# Patient Record
Sex: Male | Born: 1970
Health system: Southern US, Community
[De-identification: ages and names within clinical notes are randomized; demographics above are authoritative.]

## PROBLEM LIST (undated history)

## (undated) DIAGNOSIS — E785 Hyperlipidemia, unspecified: Secondary | ICD-10-CM

## (undated) DIAGNOSIS — F4310 Post-traumatic stress disorder, unspecified: Secondary | ICD-10-CM

## (undated) DIAGNOSIS — G4733 Obstructive sleep apnea (adult) (pediatric): Secondary | ICD-10-CM

## (undated) DIAGNOSIS — T7840XA Allergy, unspecified, initial encounter: Secondary | ICD-10-CM

## (undated) DIAGNOSIS — N400 Enlarged prostate without lower urinary tract symptoms: Secondary | ICD-10-CM

## (undated) DIAGNOSIS — G4730 Sleep apnea, unspecified: Secondary | ICD-10-CM

## (undated) DIAGNOSIS — M25561 Pain in right knee: Secondary | ICD-10-CM

## (undated) DIAGNOSIS — K219 Gastro-esophageal reflux disease without esophagitis: Secondary | ICD-10-CM

## (undated) DIAGNOSIS — M545 Low back pain, unspecified: Secondary | ICD-10-CM

## (undated) DIAGNOSIS — F419 Anxiety disorder, unspecified: Secondary | ICD-10-CM

## (undated) DIAGNOSIS — Z8673 Personal history of transient ischemic attack (TIA), and cerebral infarction without residual deficits: Secondary | ICD-10-CM

## (undated) DIAGNOSIS — M25562 Pain in left knee: Secondary | ICD-10-CM

## (undated) DIAGNOSIS — G709 Myoneural disorder, unspecified: Secondary | ICD-10-CM

## (undated) DIAGNOSIS — E279 Disorder of adrenal gland, unspecified: Secondary | ICD-10-CM

## (undated) DIAGNOSIS — M722 Plantar fascial fibromatosis: Secondary | ICD-10-CM

## (undated) DIAGNOSIS — F32A Depression, unspecified: Secondary | ICD-10-CM

## (undated) DIAGNOSIS — T8859XA Other complications of anesthesia, initial encounter: Secondary | ICD-10-CM

## (undated) DIAGNOSIS — G40909 Epilepsy, unspecified, not intractable, without status epilepticus: Secondary | ICD-10-CM

## (undated) DIAGNOSIS — R0789 Other chest pain: Secondary | ICD-10-CM

## (undated) DIAGNOSIS — M1990 Unspecified osteoarthritis, unspecified site: Secondary | ICD-10-CM

## (undated) DIAGNOSIS — E119 Type 2 diabetes mellitus without complications: Secondary | ICD-10-CM

## (undated) HISTORY — DX: Pain in right knee: M25.562

## (undated) HISTORY — PX: ESOPHAGOGASTRODUODENOSCOPY: SHX1529

## (undated) HISTORY — DX: Hyperlipidemia, unspecified: E78.5

## (undated) HISTORY — DX: Epilepsy, unspecified, not intractable, without status epilepticus: G40.909

## (undated) HISTORY — DX: Low back pain, unspecified: M54.50

## (undated) HISTORY — DX: Type 2 diabetes mellitus without complications: E11.9

## (undated) HISTORY — DX: Gastro-esophageal reflux disease without esophagitis: K21.9

## (undated) HISTORY — DX: Myoneural disorder, unspecified: G70.9

## (undated) HISTORY — DX: Allergy, unspecified, initial encounter: T78.40XA

## (undated) HISTORY — DX: Plantar fascial fibromatosis: M72.2

## (undated) HISTORY — DX: Other chest pain: R07.89

## (undated) HISTORY — DX: Pain in right knee: M25.561

## (undated) HISTORY — DX: Disorder of adrenal gland, unspecified: E27.9

## (undated) HISTORY — PX: COLONOSCOPY: SHX174

## (undated) HISTORY — DX: Benign prostatic hyperplasia without lower urinary tract symptoms: N40.0

## (undated) HISTORY — PX: NECK SURGERY: SHX720

## (undated) HISTORY — DX: Low back pain: M54.5

## (undated) HISTORY — PX: ENDOSCOPIC PLANTAR FASCIOTOMY: SUR443

## (undated) HISTORY — DX: Sleep apnea, unspecified: G47.30

## (undated) HISTORY — DX: Personal history of transient ischemic attack (TIA), and cerebral infarction without residual deficits: Z86.73

## (undated) HISTORY — PX: SHOULDER SURGERY: SHX246

## (undated) HISTORY — DX: Obstructive sleep apnea (adult) (pediatric): G47.33

## (undated) HISTORY — PX: HEEL SPUR SURGERY: SHX665

## (undated) HISTORY — PX: CARPAL TUNNEL RELEASE: SHX101

## (undated) HISTORY — PX: UPPER GASTROINTESTINAL ENDOSCOPY: SHX188

---

## 1999-02-01 DIAGNOSIS — I639 Cerebral infarction, unspecified: Secondary | ICD-10-CM

## 1999-02-01 HISTORY — DX: Cerebral infarction, unspecified: I63.9

## 2002-08-31 ENCOUNTER — Encounter: Payer: Self-pay | Admitting: Family Medicine

## 2002-08-31 ENCOUNTER — Encounter: Admission: RE | Admit: 2002-08-31 | Discharge: 2002-08-31 | Payer: Self-pay | Admitting: Family Medicine

## 2004-08-03 ENCOUNTER — Emergency Department (HOSPITAL_COMMUNITY): Admission: EM | Admit: 2004-08-03 | Discharge: 2004-08-03 | Payer: Self-pay | Admitting: Emergency Medicine

## 2004-10-25 ENCOUNTER — Ambulatory Visit: Payer: Self-pay | Admitting: Internal Medicine

## 2004-11-02 ENCOUNTER — Ambulatory Visit: Payer: Self-pay | Admitting: Pulmonary Disease

## 2004-11-04 ENCOUNTER — Ambulatory Visit (HOSPITAL_BASED_OUTPATIENT_CLINIC_OR_DEPARTMENT_OTHER): Admission: RE | Admit: 2004-11-04 | Discharge: 2004-11-04 | Payer: Self-pay | Admitting: Pulmonary Disease

## 2004-11-09 ENCOUNTER — Ambulatory Visit: Payer: Self-pay | Admitting: Internal Medicine

## 2004-11-10 ENCOUNTER — Ambulatory Visit: Payer: Self-pay | Admitting: Pulmonary Disease

## 2004-11-22 ENCOUNTER — Ambulatory Visit: Payer: Self-pay | Admitting: Pulmonary Disease

## 2004-12-21 ENCOUNTER — Ambulatory Visit: Payer: Self-pay | Admitting: Pulmonary Disease

## 2005-01-04 ENCOUNTER — Ambulatory Visit: Payer: Self-pay | Admitting: Internal Medicine

## 2005-01-18 ENCOUNTER — Ambulatory Visit: Payer: Self-pay | Admitting: Internal Medicine

## 2005-03-24 ENCOUNTER — Ambulatory Visit: Payer: Self-pay | Admitting: Internal Medicine

## 2005-07-05 ENCOUNTER — Ambulatory Visit: Payer: Self-pay | Admitting: Internal Medicine

## 2005-07-11 ENCOUNTER — Ambulatory Visit: Payer: Self-pay | Admitting: Internal Medicine

## 2005-09-29 ENCOUNTER — Ambulatory Visit: Payer: Self-pay | Admitting: Internal Medicine

## 2005-10-07 ENCOUNTER — Ambulatory Visit: Payer: Self-pay | Admitting: Internal Medicine

## 2005-10-07 ENCOUNTER — Ambulatory Visit (HOSPITAL_COMMUNITY): Admission: RE | Admit: 2005-10-07 | Discharge: 2005-10-07 | Payer: Self-pay | Admitting: Internal Medicine

## 2005-10-20 ENCOUNTER — Ambulatory Visit: Payer: Self-pay | Admitting: Internal Medicine

## 2005-10-31 ENCOUNTER — Ambulatory Visit: Payer: Self-pay | Admitting: Internal Medicine

## 2006-03-02 ENCOUNTER — Ambulatory Visit: Payer: Self-pay | Admitting: Internal Medicine

## 2006-05-02 ENCOUNTER — Ambulatory Visit: Payer: Self-pay | Admitting: Internal Medicine

## 2006-05-11 ENCOUNTER — Encounter: Admission: RE | Admit: 2006-05-11 | Discharge: 2006-07-06 | Payer: Self-pay | Admitting: Internal Medicine

## 2006-08-23 DIAGNOSIS — M545 Low back pain: Secondary | ICD-10-CM

## 2006-08-23 DIAGNOSIS — E785 Hyperlipidemia, unspecified: Secondary | ICD-10-CM

## 2006-08-23 DIAGNOSIS — G40909 Epilepsy, unspecified, not intractable, without status epilepticus: Secondary | ICD-10-CM | POA: Insufficient documentation

## 2006-08-23 DIAGNOSIS — E1169 Type 2 diabetes mellitus with other specified complication: Secondary | ICD-10-CM | POA: Insufficient documentation

## 2006-08-23 DIAGNOSIS — G4733 Obstructive sleep apnea (adult) (pediatric): Secondary | ICD-10-CM | POA: Insufficient documentation

## 2006-08-23 DIAGNOSIS — M722 Plantar fascial fibromatosis: Secondary | ICD-10-CM | POA: Insufficient documentation

## 2006-08-23 DIAGNOSIS — M2550 Pain in unspecified joint: Secondary | ICD-10-CM | POA: Insufficient documentation

## 2006-08-23 DIAGNOSIS — G8929 Other chronic pain: Secondary | ICD-10-CM | POA: Insufficient documentation

## 2006-08-23 DIAGNOSIS — Z8673 Personal history of transient ischemic attack (TIA), and cerebral infarction without residual deficits: Secondary | ICD-10-CM | POA: Insufficient documentation

## 2006-08-23 DIAGNOSIS — J309 Allergic rhinitis, unspecified: Secondary | ICD-10-CM | POA: Insufficient documentation

## 2006-08-23 DIAGNOSIS — M109 Gout, unspecified: Secondary | ICD-10-CM | POA: Insufficient documentation

## 2006-09-04 ENCOUNTER — Encounter: Payer: Self-pay | Admitting: Internal Medicine

## 2007-02-08 ENCOUNTER — Ambulatory Visit: Payer: Self-pay | Admitting: Internal Medicine

## 2007-02-08 LAB — CONVERTED CEMR LAB
ALT: 11 units/L (ref 0–53)
AST: 16 units/L (ref 0–37)
BUN: 8 mg/dL (ref 6–23)
Bilirubin, Direct: 0.1 mg/dL (ref 0.0–0.3)
Cholesterol: 269 mg/dL (ref 0–200)
Eosinophils Absolute: 0.1 10*3/uL (ref 0.0–0.6)
Eosinophils Relative: 1.7 % (ref 0.0–5.0)
GFR calc Af Amer: 97 mL/min
Ketones, ur: NEGATIVE mg/dL
Leukocytes, UA: NEGATIVE
Lymphocytes Relative: 37.8 % (ref 12.0–46.0)
MCHC: 33.8 g/dL (ref 30.0–36.0)
Monocytes Relative: 7.1 % (ref 3.0–11.0)
Neutro Abs: 3.4 10*3/uL (ref 1.4–7.7)
Neutrophils Relative %: 52.9 % (ref 43.0–77.0)
Nitrite: NEGATIVE
Potassium: 3.8 meq/L (ref 3.5–5.1)
Sodium: 135 meq/L (ref 135–145)
Total Bilirubin: 0.7 mg/dL (ref 0.3–1.2)
Total CHOL/HDL Ratio: 6.8
Triglycerides: 94 mg/dL (ref 0–149)
VLDL: 19 mg/dL (ref 0–40)
WBC: 6.5 10*3/uL (ref 4.5–10.5)
pH: 6 (ref 5.0–8.0)

## 2007-02-15 ENCOUNTER — Ambulatory Visit: Payer: Self-pay | Admitting: Internal Medicine

## 2007-02-15 DIAGNOSIS — M25519 Pain in unspecified shoulder: Secondary | ICD-10-CM | POA: Insufficient documentation

## 2007-02-22 ENCOUNTER — Ambulatory Visit (HOSPITAL_BASED_OUTPATIENT_CLINIC_OR_DEPARTMENT_OTHER): Admission: RE | Admit: 2007-02-22 | Discharge: 2007-02-22 | Payer: Self-pay | Admitting: Orthopaedic Surgery

## 2007-03-13 ENCOUNTER — Encounter: Admission: RE | Admit: 2007-03-13 | Discharge: 2007-05-03 | Payer: Self-pay | Admitting: Orthopaedic Surgery

## 2007-04-06 ENCOUNTER — Ambulatory Visit: Payer: Self-pay | Admitting: Internal Medicine

## 2007-04-06 LAB — CONVERTED CEMR LAB
CRP, High Sensitivity: 2 (ref 0.00–5.00)
Cholesterol: 156 mg/dL (ref 0–200)
HDL: 49.9 mg/dL (ref >39.0)
Triglycerides: 56 mg/dL (ref 0–149)
Uric Acid, Serum: 6.7 mg/dL (ref 2.4–7.0)

## 2007-04-12 ENCOUNTER — Ambulatory Visit: Payer: Self-pay | Admitting: Internal Medicine

## 2007-05-18 ENCOUNTER — Ambulatory Visit (HOSPITAL_BASED_OUTPATIENT_CLINIC_OR_DEPARTMENT_OTHER): Admission: RE | Admit: 2007-05-18 | Discharge: 2007-05-18 | Payer: Self-pay | Admitting: Orthopaedic Surgery

## 2007-06-06 ENCOUNTER — Ambulatory Visit: Payer: Self-pay | Admitting: Internal Medicine

## 2007-06-06 LAB — CONVERTED CEMR LAB: Uric Acid, Serum: 7 mg/dL (ref 4.0–7.8)

## 2007-06-12 ENCOUNTER — Ambulatory Visit: Payer: Self-pay | Admitting: Internal Medicine

## 2007-06-12 DIAGNOSIS — M79609 Pain in unspecified limb: Secondary | ICD-10-CM | POA: Insufficient documentation

## 2007-06-12 LAB — CONVERTED CEMR LAB
Anti Nuclear Antibody(ANA): NEGATIVE
Rhuematoid fact SerPl-aCnc: <20 intl units/mL — ABNORMAL LOW (ref 0.0–20.0)
Sed Rate: 13 mm/hr (ref 0–16)

## 2007-06-13 ENCOUNTER — Telehealth (INDEPENDENT_AMBULATORY_CARE_PROVIDER_SITE_OTHER): Payer: Self-pay | Admitting: *Deleted

## 2007-06-18 ENCOUNTER — Encounter: Payer: Self-pay | Admitting: Internal Medicine

## 2007-07-04 ENCOUNTER — Encounter: Payer: Self-pay | Admitting: Internal Medicine

## 2007-09-17 ENCOUNTER — Ambulatory Visit: Payer: Self-pay | Admitting: Internal Medicine

## 2008-08-28 ENCOUNTER — Ambulatory Visit: Payer: Self-pay | Admitting: Internal Medicine

## 2008-08-28 LAB — CONVERTED CEMR LAB
ALT: 11 units/L (ref 0–53)
AST: 18 units/L (ref 0–37)
Albumin: 4.3 g/dL (ref 3.5–5.2)
Alkaline Phosphatase: 63 units/L (ref 39–117)
Basophils Absolute: 0 10*3/uL (ref 0.0–0.1)
Basophils Relative: 0 % (ref 0–1)
Bilirubin Urine: NEGATIVE
CO2: 21 meq/L (ref 19–32)
Calcium: 9.4 mg/dL (ref 8.4–10.5)
Cholesterol: 245 mg/dL — ABNORMAL HIGH (ref 0–200)
Creatinine, Ser: 1.05 mg/dL (ref 0.40–1.50)
Eosinophils Absolute: 0.1 10*3/uL (ref 0.0–0.7)
HDL: 41 mg/dL (ref >39)
Ketones, ur: NEGATIVE mg/dL
MCHC: 32.6 g/dL (ref 30.0–36.0)
MCV: 78.8 fL (ref 78.0–100.0)
Monocytes Relative: 9 % (ref 3–12)
Neutro Abs: 2.7 10*3/uL (ref 1.7–7.7)
Neutrophils Relative %: 48 % (ref 43–77)
Platelets: 255 10*3/uL (ref 150–400)
RDW: 15.3 % (ref 11.5–15.5)
Specific Gravity, Urine: 1.022 (ref 1.005–1.030)
Total Bilirubin: 0.3 mg/dL (ref 0.3–1.2)
Urobilinogen, UA: 1 (ref 0.0–1.0)
pH: 6 (ref 5.0–8.0)

## 2008-09-16 ENCOUNTER — Ambulatory Visit: Payer: Self-pay | Admitting: Internal Medicine

## 2008-09-23 ENCOUNTER — Telehealth: Payer: Self-pay | Admitting: Internal Medicine

## 2008-12-08 ENCOUNTER — Ambulatory Visit: Payer: Self-pay | Admitting: Diagnostic Radiology

## 2008-12-08 ENCOUNTER — Ambulatory Visit: Payer: Self-pay | Admitting: Internal Medicine

## 2008-12-08 ENCOUNTER — Ambulatory Visit (HOSPITAL_BASED_OUTPATIENT_CLINIC_OR_DEPARTMENT_OTHER): Admission: RE | Admit: 2008-12-08 | Discharge: 2008-12-08 | Payer: Self-pay | Admitting: Internal Medicine

## 2008-12-08 DIAGNOSIS — M25561 Pain in right knee: Secondary | ICD-10-CM

## 2008-12-08 DIAGNOSIS — G8929 Other chronic pain: Secondary | ICD-10-CM | POA: Insufficient documentation

## 2008-12-08 DIAGNOSIS — M25562 Pain in left knee: Secondary | ICD-10-CM

## 2008-12-08 LAB — CONVERTED CEMR LAB
Anti Nuclear Antibody(ANA): NEGATIVE
Sed Rate: 6 mm/hr (ref 0–16)

## 2008-12-14 ENCOUNTER — Telehealth: Payer: Self-pay | Admitting: Internal Medicine

## 2009-01-07 ENCOUNTER — Ambulatory Visit: Payer: Self-pay | Admitting: Internal Medicine

## 2009-01-13 ENCOUNTER — Ambulatory Visit: Payer: Self-pay | Admitting: Internal Medicine

## 2009-02-11 ENCOUNTER — Encounter: Payer: Self-pay | Admitting: Internal Medicine

## 2009-03-16 ENCOUNTER — Ambulatory Visit: Payer: Self-pay | Admitting: Internal Medicine

## 2009-03-16 DIAGNOSIS — E669 Obesity, unspecified: Secondary | ICD-10-CM | POA: Insufficient documentation

## 2009-04-14 ENCOUNTER — Encounter: Admission: RE | Admit: 2009-04-14 | Discharge: 2009-06-16 | Payer: Self-pay | Admitting: Internal Medicine

## 2009-04-16 ENCOUNTER — Encounter: Payer: Self-pay | Admitting: Internal Medicine

## 2009-05-06 ENCOUNTER — Telehealth: Payer: Self-pay | Admitting: Internal Medicine

## 2009-06-04 ENCOUNTER — Encounter: Payer: Self-pay | Admitting: Internal Medicine

## 2009-06-11 ENCOUNTER — Telehealth: Payer: Self-pay | Admitting: Internal Medicine

## 2009-06-12 ENCOUNTER — Encounter: Payer: Self-pay | Admitting: Internal Medicine

## 2009-06-12 LAB — CONVERTED CEMR LAB
ALT: 11 units/L (ref 0–53)
AST: 14 units/L (ref 0–37)
HDL: 43 mg/dL (ref >39)
LDL Cholesterol: 200 mg/dL — ABNORMAL HIGH (ref 0–99)
Total CHOL/HDL Ratio: 6.2
VLDL: 24 mg/dL (ref 0–40)

## 2009-06-15 ENCOUNTER — Ambulatory Visit: Payer: Self-pay | Admitting: Internal Medicine

## 2009-06-16 ENCOUNTER — Encounter (INDEPENDENT_AMBULATORY_CARE_PROVIDER_SITE_OTHER): Payer: Self-pay | Admitting: *Deleted

## 2009-06-16 ENCOUNTER — Encounter: Payer: Self-pay | Admitting: Internal Medicine

## 2009-09-18 ENCOUNTER — Encounter: Payer: Self-pay | Admitting: Internal Medicine

## 2009-09-21 ENCOUNTER — Ambulatory Visit: Payer: Self-pay | Admitting: Internal Medicine

## 2010-01-14 ENCOUNTER — Telehealth (INDEPENDENT_AMBULATORY_CARE_PROVIDER_SITE_OTHER): Payer: Self-pay | Admitting: *Deleted

## 2010-03-04 NOTE — Miscellaneous (Signed)
Summary: PT Progress Note/[facility]  PT Progress Note/[facility]   Imported By: Lanelle Bal 06/10/2009 09:41:54  _____________________________________________________________________  External Attachment:    Type:   Image     Comment:   External Document

## 2010-03-04 NOTE — Letter (Signed)
Summary: CMN for CPAP/[facility]  CMN for CPAP/[facility]   Imported By: Lanelle Bal 09/25/2009 11:02:59  _____________________________________________________________________  External Attachment:    Type:   Image     Comment:   External Document

## 2010-03-04 NOTE — Consult Note (Signed)
Summary: [facility] Rheumatology & Clinical Immunology  [facility] Rheumatology & Clinical Immunology   Imported By: Lanelle Bal 03/05/2009 11:21:17  _____________________________________________________________________  External Attachment:    Type:   Image     Comment:   External Document

## 2010-03-04 NOTE — Progress Notes (Signed)
Summary: Flexiril Refill  Phone Note Call from Patient Call back at Home Phone [phone]   Caller: Patient Reason for Call: Refill Medication Summary of Call: pt is requesting refill for muscle relaxer for his back pain  Diane Tomerlin  May 06, 2009 1:40 PM  Follow-up for Phone Call        ok to refill flexeril x 1 Follow-up by: D. Thomos Lemons DO,  May 06, 2009 10:08 PM  Additional Follow-up for Phone Call Additional follow up Details #1::        [facility] [address]  Attempted to contact pt. and let him know refill was sent in but home # is unable to accept calls.   Left message on voicemail at work number to return call.  Mervin Kung CMA  May 07, 2009 9:39 AM     Additional Follow-up for Phone Call Additional follow up Details #2::    attempted to contact patient at  [phone], no answer, voice message left informing patient rx sent to pharmacy Follow-up by: Glendell Docker CMA,  May 07, 2009 3:31 PM  Prescriptions: CYCLOBENZAPRINE HCL 5 MG TABS (CYCLOBENZAPRINE HCL) one by mouth at bedtime prn  #30 x 0   Entered by:   Mervin Kung CMA   Authorized by:   D. Thomos Lemons DO   Signed by:   Mervin Kung CMA on 05/07/2009   Method used:   [facility]  [address] [id number]       [address]       [place], [place]  [address]       Ph: [phone]       Fax: [phone]   RxID:   [id number]

## 2010-03-04 NOTE — Progress Notes (Signed)
  Phone Note Other Incoming   Request: Send information Summary of Call: Request for records received from [facility] office. Request forwarded to [employer].

## 2010-03-04 NOTE — Progress Notes (Signed)
Summary: Pt question about 5.16.11 appt  Phone Note Call from Patient Call back at [phone]   Caller: Patient Reason for Call: Talk to Nurse Summary of Call: Pt has an appt on Mon 06/15/09, he wants to know if he needs to have any labs before the appt. Pls call pt to advise Initial call taken by: Lannette Donath,  Jun 11, 2009 9:26 AM  Follow-up for Phone Call        patient has been advised of blood work needed. He states he will have the labs drawn on  Friday 06/12/2009 Follow-up by: Glendell Docker CMA,  Jun 11, 2009 5:29 PM

## 2010-03-04 NOTE — Assessment & Plan Note (Signed)
Summary: 2 MONTH FOLLOW UP/[facility]   Vital Signs:  Patient profile:   40 year old Hodge Height:      70 inches Weight:      269.50 pounds BMI:     38.81 O2 Sat:      97 % on Room air Temp:     98.6 degrees F oral Pulse rate:   84 / minute Pulse rhythm:   regular Resp:     20 per minute BP sitting:   116 / 80  (left arm) Cuff size:   large  Vitals Entered By: Glendell Docker CMA (Jun 15, 2009 10:41 AM)  O2 Flow:  Room air CC: Rm 2- 2 Month Follow up disease management Pain Assessment Patient in pain? yes     Location: back & Knee Type: aching & throbbing   Primary Care Provider:  [name]  CC:  Rm 2- 2 Month Follow up disease management.  History of Present Illness: [name] for f/u  still  /o bilateral pain in knee- worse when going from a sitting toh standing, swelling in legs with wearing of socks and on a daily basis from knees down muscle relaxers and sulindac helping   obesity breastfast - slim fast daily,  honey nut cheerios lunch - light meal dinner - baked or grilled chicken, no dairy products, macronic and cheese  Allergies: 1)  ! Lipitor  Past History:  Past Medical History: Allergic rhinitis Gout Hyperlipidemia   Seizure disorder      Cerebrovascular accident, hx of  Low back pain  Bilateral knee pain Left plantar fascitis - followed by Dr. Harriet Pho  Family History: Mother is age 33 without significant health issues Father age 65 of hypertension, diabetes Maternal grandfather with history of stroke        Social History: Married  with two children Alcohol use-no       Former Smoker - quit in 1990 (smoked for two years)   Occupation:  works with church,  previously with [employer]  Physical Exam  General:  alert, well-developed, and well-nourished.   Lungs:  normal respiratory effort and normal breath sounds.   Heart:  normal rate, regular rhythm, and no gallop.   Msk:  Knees - no joint tenderness, no joint swelling, no  joint warmth, and no joint instability.    significant knee cap pain with squatting   Impression & Recommendations:  Problem # 1:  KNEE PAIN, BILATERAL (ICD-719.46) Pt having persistent bilateral knee cap pain.  due to mild elevation in CPK, pt was seen by rheum.  no myopathy.  symptoms attributed to patellofemoral syndrome.  no improvement with NSAIDs.  refer to ortho for further eval and tx refill muscle relaxer  The following medications were removed from the medication list:    Sulindac 150 Mg Tabs (Sulindac) ..... One by mouth two times a day as needed His updated medication list for this problem includes:    Cyclobenzaprine Hcl 5 Mg Tabs (Cyclobenzaprine hcl) ..... One by mouth at bedtime prn  Orders: Orthopedic Referral (Ortho)  Complete Medication List: 1)  Fish Oil 1000 Mg Caps (Omega-3 fatty acids) .... Take 1 tablet by mouth three times a day 2)  Cyclobenzaprine Hcl 5 Mg Tabs (Cyclobenzaprine hcl) .... One by mouth at bedtime prn 3)  Crestor 20 Mg Tabs (Rosuvastatin calcium) .... One by mouth once daily 4)  Voltaren 1 % Gel (Diclofenac sodium) .... Apply 2 grams three times a day  Patient  Instructions: 1)  Please schedule a follow-up appointment in 3 months. Prescriptions: CYCLOBENZAPRINE HCL 5 MG TABS (CYCLOBENZAPRINE HCL) one by mouth at bedtime prn  #30 x 0   Entered and Authorized by:   D. Thomos Lemons DO   Signed by:   D. Thomos Lemons DO on 06/15/2009   Method used:   [facility]        [facility]  [address] [id number]       [address]       [place], [place]  [address]       Ph: [phone]       Fax: [phone]   RxID:   [id number] VOLTAREN 1 % GEL (DICLOFENAC SODIUM) apply 2 grams three times a day  #3 PK x 1   Entered and Authorized by:   D. Thomos Lemons DO   Signed by:   D. Thomos Lemons DO on 06/15/2009   Method used:   [facility]ically to        [facility]  [address] [id number]       [address]       [place], [place]  [address]       Ph: [phone]       Fax: [phone]   RxID:   [id number]   Current Allergies (reviewed today): ! LIPITOR

## 2010-03-04 NOTE — Miscellaneous (Signed)
Summary: PT Initial Summary/[facility]  PT Initial Summary/[facility]   Imported By: Lanelle Bal 04/24/2009 09:21:48  _____________________________________________________________________  External Attachment:    Type:   Image     Comment:   External Document

## 2010-03-04 NOTE — Consult Note (Signed)
Summary: [facility] Rheumatology & Clinical Immunology  [facility] Rheumatology & Clinical Immunology   Imported By: Lanelle Bal 03/06/2009 12:32:48  _____________________________________________________________________  External Attachment:    Type:   Image     Comment:   External Document

## 2010-03-04 NOTE — Assessment & Plan Note (Signed)
Summary: 6 MONTH FOLLOW UP/[facility]   Vital Signs:  Patient profile:   40 year old male Weight:      272 pounds BMI:     39.17 O2 Sat:      98 % on Room air Temp:     98.1 degrees F oral Pulse rate:   86 / minute Pulse rhythm:   regular Resp:     18 per minute BP sitting:   116 / 80  (right arm) Cuff size:   large  Vitals Entered By: Glendell Docker CMA (March 16, 2009 8:36 AM)  O2 Flow:  Room air  Primary Care Provider:  D. Thomos Lemons DO  CC:  6 Month Follow up.  History of Present Illness: 6 Month Follow up c/o unresolved bilateral knee soreness, worse with standing and sitting status post right foot surgery 02/18/2009  left foot surgery - Topaz procedure  for plantar fascitis  obesity - less active because of left plantar fascitis.  trying to eat healthier.  less sweets.  still drinkings soft drinks.   Preventive Screening-Counseling & Management  Alcohol-Tobacco     Smoking Status: quit  Allergies: 1)  ! Lipitor  Past History:  Past Medical History: Allergic rhinitis Gout Hyperlipidemia   Seizure disorder     Cerebrovascular accident, hx of  Low back pain  Bilateral knee pain Left plantar fascitis - followed by Dr. Harriet Pho  Family History: Mother is age 55 without significant health issues Father age 53 of hypertension, diabetes Maternal grandfather with history of stroke       Social History: Married  with two children Alcohol use-no      Former Smoker - quit in 1990 (smoked for two years)   Occupation:  works with church,  previously with [employer]  Physical Exam  General:  alert and overweight-appearing.   Lungs:  normal respiratory effort and normal breath sounds.   Heart:  normal rate, regular rhythm, and no gallop.   Msk:  knees - normal ROM, no joint swelling, no joint warmth, no redness over joints, and no joint instability.   Extremities:  No lower extremity edema    Impression & Recommendations:  Problem # 1:  KNEE PAIN,  BILATERAL (ICD-719.46)  Pt seen by rheum at [facility] -Dr Christen Bame.  He suspects patellofemoral syndrome.  no suspicion for inflammatory arthritis.  PT and NSAIDs recommended.    Mild elevation in CPK -  CPKs can run slightly higher in [place] americans  He never started PT.  Refer to PT at [facility].  His updated medication list for this problem includes:    Cyclobenzaprine Hcl 5 Mg Tabs (Cyclobenzaprine hcl) ..... One by mouth at bedtime prn    Sulindac 150 Mg Tabs (Sulindac) ..... One by mouth two times a day as needed  Orders: Physical Therapy Referral (PT)  Problem # 2:  HYPERLIPIDEMIA (ICD-272.4) no change in msk symptoms with stopping crestor.  mild elevation in CPK likely not related to statin use.  restart Crestor.  pt to use 1/2 dose or 10 mg.   His updated medication list for this problem includes:    Crestor 20 Mg Tabs (Rosuvastatin calcium) ..... One by mouth once daily  Problem # 5:  OBESITY, UNSPECIFIED (ICD-278.00) We discussed wt loss strategies.  Monitor calorie intake  Complete Medication List: 1)  Fish Oil 1000 Mg Caps (Omega-3 fatty acids) .... Take 1 tablet by mouth three times a day 2)  Cyclobenzaprine Hcl 5 Mg Tabs (Cyclobenzaprine  hcl) .... One by mouth at bedtime prn 3)  Sulindac 150 Mg Tabs (Sulindac) .... One by mouth two times a day as needed 4)  Crestor 20 Mg Tabs (Rosuvastatin calcium) .... One by mouth once daily  Other Orders: Tdap => 72yrs IM (14782) Admin 1st Vaccine (95621)  Patient Instructions: 1)  [email or web address] 2)  Please schedule a follow-up appointment in 2 months. 3)  AST, ALT prior to visit, ICD-9: 272.4 4)  Lipid Panel prior to visit, ICD-9: 272.4 5)  CPK: 272.4 6)  Please return for lab work one (1) week before your next appointment.  Prescriptions: CRESTOR 20 MG TABS (ROSUVASTATIN CALCIUM) one by mouth once daily  #30 x 2   Entered and Authorized by:   D. Thomos Lemons DO   Signed by:   D. Thomos Lemons DO on  03/16/2009   Method used:   [facility] to        [facility]  [address] [id number]       [address]       [place], [place]  [address]       Ph: [phone]       Fax: [phone]   RxID:   [id number]    Immunizations Administered:  Tetanus Vaccine:    Vaccine Type: Tdap    Site: left deltoid    Mfr: GlaxoSmithKline    Dose: 0.5 ml    Route: IM    Given by: Glendell Docker CMA    Exp. Date: 03/28/2011    Lot #: [id number]    VIS given: 12/19/06 version given March 16, 2009.

## 2010-03-04 NOTE — Miscellaneous (Signed)
Summary: PT Discharge/[facility]  [facility]charge/ [facility]   Imported By: Lanelle Bal 07/03/2009 08:16:15  _____________________________________________________________________  External Attachment:    Type:   Image     Comment:   External Document

## 2010-03-04 NOTE — Letter (Signed)
Summary: Primary Care Consult Scheduled Letter  [place] [facility]  [address]   [place], [place] [address]   Phone: [phone]  Fax: [phone]      06/16/2009 MRN: [id number]  [name] [address] [place], [place]  [address]    Dear Mr. Konczal,      We have scheduled an appointment for you.  At the recommendation of Dr.YOO, we have scheduled you a consult with DR Durward Fortes [facility], on JUNE 1 ,2011 at 10:30AM.  Their address is_[address]  [address], [place] ,[place]  [id number]. The office phone number is [phone].  If this appointment day and time is not convenient for you, please feel free to call the office of the doctor you are being referred to at the number listed above and reschedule the appointment.     It is important for you to keep your scheduled appointments. We are here to make sure you are given good patient care. If you have questions or you have made changes to your appointment, please notify us at  [phone], ask for HELEN.    Thank you,  Patient Care Coordinator [place] at [facility]

## 2010-04-29 ENCOUNTER — Encounter: Payer: Self-pay | Admitting: Family

## 2010-04-29 ENCOUNTER — Ambulatory Visit: Payer: Self-pay | Admitting: Family

## 2010-06-15 NOTE — Op Note (Signed)
NAMEJONAHTAN, [name]             ACCOUNT NO.:  [id number]   MEDICAL RECORD NO.:  [id number]          PATIENT TYPE:  AMB   LOCATION:  DSC                          FACILITY:  [facility]   PHYSICIAN:  Vanita Panda. Magnus Ivan, M.D.DATE OF BIRTH:  Aug 29, 1970   DATE OF PROCEDURE:  DATE OF DISCHARGE:                               OPERATIVE REPORT   PREOPERATIVE DIAGNOSIS:  Recurrent right shoulder impingement and  acromioclavicular joint arthropathy, status post previous arthroscopy.   POSTOPERATIVE DIAGNOSIS:  Recurrent right shoulder impingement and  acromioclavicular joint arthropathy, status post previous arthroscopy   PROCEDURE:  1. Right shoulder arthroscopy with repeat debridement.  2. Right shoulder subacromial decompression.  3. Right shoulder open acromioclavicular joint resection.   SURGEON:  Doneen Poisson, MD.   ASSISTANT:  Wende Neighbors, P.A.C.   ANESTHESIA:  1. Right shoulder block.  2. General.   BLOOD LOSS:  Minimal.   COMPLICATIONS:  None.   INDICATIONS:  Mr. [name] is a 40 year old male who back in January or  February underwent a right shoulder arthroscopy with coplaning under the  clavicle and the [facility] joint and subacromial decompression.  He continued  to have pain and grinding in his shoulder in spite of rest, anti-  inflammatories, and even injection to his shoulder.  Grinding pain has  gotten so bad, I felt that I had no other choice, but to proceed with an  open [facility] joint resection with the second-look arthroscopy.  The risks and  benefits of this were explained to him at length and he agreed to  proceed with the surgery.   PROCEDURE:  After informed consent was obtained, appropriate right  shoulder was marked, the block was obtained by anesthesia, he was then  brought to the operating room, placed supine on the operating table.  General anesthesia was then obtained.  He was then placed into a beach  chair position with appropriate padding at the  down nonoperative left  arm, appropriate bending at the waist and knees and appropriate padding  and positioning of the head and neck.  His right arm was then placed and  in-line skeletal traction with 45 degrees of forward flexion, neutral  rotation, and neutral abduction/adduction, and 10 pounds of traction.  The right arm was prepped and draped with DuraPrep and sterile drapes.  A time-out was called, he was identified as the correct patient and the  correct right shoulder.  I then made a posterior incision over the  posterolateral edge of acromion through his previous arthroscopy portal  site.  The scope was inserted into the glenohumeral joint.  I found mild  inflammatory changes within the glenohumeral joint but all structures  were intact.  I made an anterior portal just lateral to the coracoid  process and I probed the labrum, the biceps tendon, the subscapularis  tendon, short rotator cuff, and all these were intact with just minimum  inflamed tissue.  An arthroscopic soft tissue ablation wand was used to  perform minimal debridement.  In the posterior portal, I next entered  the subacromial space, you could see where I had provided previous  steroid injection in his shoulder.  I made a separate lateral portal and  put an arthroscopic soft tissue ablation wand and debrided his scar  tissue on top of the rotator cuff.  The rotator cuff was probed and this  was found itself to be intact without any tearing.  There was certainly  bone spurs due to development of scar tissue developed around the [facility]  joint and I used a soft tissue ablation wand to debride these areas.  I  did release the CA ligament.  I did use a high-speed bur to further  perform a distal clavicle resection.  I then opened the [facility] joint and  using the scope was able to look in the shoulder as I performed a distal  clavicle resection using a rongeur to smooth off the edge of the bone  and had a adequate space of at  least half a cm more between the distal  clavicle and the acromion.  I could put my finger underneath the  acromion and rotate the humerus internally and externally, and it failed  to have an adequate space at that point.  I then removed all  instrumentation and thoroughly washed out the subacromial space.  I  closed the deep tissue over the [facility] joint with 0-Vicryl followed by 2-0  Vicryl subcutaneous tissue and interrupted 3-0 nylon on the skin as well  as all arthroscopy portal sites.  Xeroform followed by a sterile  dressing was applied.  The patient's arm was placed in a sling.  He was  awakened, extubated, and taken to the recovery room in stable condition.  All final counts correct.  There are no complications noted.  Postoperatively, I will allow him to be on a sling for 3-4 days.  We  will limit his overhead activities for least 6 weeks.  Follow-up in the  office will be in 2 weeks.      Vanita Panda. Magnus Ivan, M.D.  Electronically Signed     CYB/MEDQ  D:  05/18/2007  T:  05/19/2007  Job:  [id number]

## 2010-06-15 NOTE — Op Note (Signed)
[name], [name]             ACCOUNT NO.:  [id number]   MEDICAL RECORD NO.:  [id number]          PATIENT TYPE:  AMB   LOCATION:  DSC                          FACILITY:  [facility]   PHYSICIAN:  Vanita Panda. Magnus Ivan, M.D.DATE OF BIRTH:  Dec 18, 1970   DATE OF PROCEDURE:  02/22/2007  DATE OF DISCHARGE:                               OPERATIVE REPORT   PREOPERATIVE DIAGNOSIS:  Right shoulder impingement syndrome and  acromioclavicular joint arthropathy.   POSTOPERATIVE DIAGNOSIS:  Right shoulder impingement syndrome and  acromioclavicular joint arthropathy.   PROCEDURE:  Right shoulder arthroscopy with debridement, subacromial  decompression, and arthroscopic distal clavicle resection.   SURGEON:  Vanita Panda. Magnus Ivan, M.D.   ANESTHESIA:  1. Right shoulder regional block.  2. General.   ESTIMATED BLOOD LOSS:  Minimal.   COMPLICATIONS:  None.   INDICATIONS:  Briefly, [name] is a 40 year old right-hand dominant  individual with exquisite pain in his right shoulder.  Injections have  helped in the past, and most of his pain seemed to be focused at the [facility]  joint in the subacromial space.  It was recommended that he undergo  arthroscopy with debridement, subacromial decompression, and distal  clavicle resection given the amount of pain that he was having but also  given the success we had had with injections.  His biggest problems were  with overhead activities, and it was getting to the point where the pain  was becoming debilitating.   The risks and benefits of surgery were explained to him and well-  understood, and he agreed to proceed with surgery.   PROCEDURE:  After informed consent was obtained, the appropriate right  shoulder was marked.  Anesthesia obtained a shoulder block, and he was  brought to the operating room and placed supine on the operating table.  General anesthesia was then obtained.  He was then fashioned in a beach  chair position with  appropriate positioning of the head and neck and  padding of these areas as well as padding of the down nonoperative left  arm as __________  appropriate bending at the waist and knees.  His  right operative arm was then prepped and draped with DuraPrep and  sterile drapes including a sterile stockinette and was placed in in-line  skeletal traction with 45 degrees of forward flexion and neutral  rotation in neutral abduction and adduction.  A timeout was called, and  he was identified as the correct patient and correct extremity.   I then made a posterior portal off the posterior lateral edge of  acromion and then tried to enter the subacromial space.  His shoulder  was quite big, and I felt that this was not an adequate portal, so I  moved slightly lateral and made a separate portal and I was able to  enter the glenohumeral joint at that time.  Right away, I went and made  an anterior portal just lateral to the coracoid process.  There was a  lot of inflamed tissue within the glenohumeral joint itself, and I used  an ArthroCare soft tissue ablation wand to clean inflamed tissue.  Basically,  though, the structures were intact, although I do believe  that he has evidence of a previous shoulder dislocation.  There was a  small Hill-Sachs lesion, but the labrum itself was intact.  The  subscapularis tendon and the biceps tendon were intact.  There was only  minimal irregularity of the articular surface of the rotator cuff.  Next, through the posterior portal, I entered the subacromial space and  made a separate lateral portal just off the lateral edge of the  acromion.  There was significant bursitis and tendinosis of the rotator  cuff.  I used a soft tissue ablation wand and an arthroscopic shaver to  perform a subacromial decompression.  This included shaving of the  undersurface of the acromion and coplaning under the clavicle.  Once I  felt that there was adequate space, I made a  separate anterior portal  and used a high-speed bur to perform a partial acromioplasty and then  shaving the distal clavicle.  All instrumentation was then removed, and  I closed the portal sites with interrupted 3-0 nylon suture.  Xeroform  followed by a well-padded sterile dressing was applied.  His arm was  taken out of traction.  He was placed in a large sling.   He was awakened, extubated, and taken to the recovery room in stable  condition.  All final counts were correct, and there no complications  noted.      Vanita Panda. Magnus Ivan, M.D.  Electronically Signed     CYB/MEDQ  D:  02/22/2007  T:  02/22/2007  Job:  [id number]

## 2010-06-18 NOTE — Procedures (Signed)
NAMESOSTENES, KAUFFMANN NO.:  [id number]   MEDICAL RECORD NO.:  [id number]          PATIENT TYPE:  OUT   LOCATION:  SLEEP CENTER                 FACILITY:  [facility]   PHYSICIAN:  Marcelyn Bruins, M.D. [facility] DATE OF BIRTH:  1970/04/19   DATE OF STUDY:  11/04/2004                              NOCTURNAL POLYSOMNOGRAM   REFERRING PHYSICIAN:  Marcelyn Bruins, M.D.   DATE OF STUDY:  November 04, 2004.   INDICATIONS FOR STUDY:  Hypersomnia with sleep apnea.   EPWORTH SCORE:  12.   SLEEP ARCHITECTURE:  The patient has total sleep time of 320 minutes with  decreased REM and slow wave sleep. Sleep onset latency was normal as was REM  onset. Sleep efficiency was decreased to 77%.   RESPIRATORY DATA:  The patient was found to have large numbers of  obstructive apneas and hypopneas for a respiratory disturbance index of 78  events per hour. These were more consistent in the supine position, and  there was also moderate snoring noted throughout.   OXYGEN DATA:  The patient and O2 desaturation as low as 81% with these  obstructive events.   CARDIAC DATA:  No clinically significant cardiac arrhythmias.   MOVEMENT/PARASOMNIAS:  There were small numbers of leg jerks with very  little sleep disruption.   IMPRESSION/RECOMMENDATIONS:  Severe obstructive sleep apnea with a  respiratory disturbance index of 78 events per hour and O2 desaturation as  low as 81%. Treatment for this degree of apnea should focus primarily on  weight loss as well as CPAP.           ______________________________  Marcelyn Bruins, M.D. [facility]  Diplomate, American Board of Sleep  Medicine     KC/MEDQ  D:  11/10/2004 12:39:00  T:  11/10/2004 14:18:26  Job:  [id number]

## 2010-10-21 LAB — POCT HEMOGLOBIN-HEMACUE: Hemoglobin: 15.4

## 2010-10-26 ENCOUNTER — Ambulatory Visit: Payer: Self-pay | Admitting: Internal Medicine

## 2010-11-04 ENCOUNTER — Other Ambulatory Visit (INDEPENDENT_AMBULATORY_CARE_PROVIDER_SITE_OTHER): Payer: 59

## 2010-11-04 DIAGNOSIS — Z0000 Encounter for general adult medical examination without abnormal findings: Secondary | ICD-10-CM

## 2010-11-04 LAB — POCT URINALYSIS DIPSTICK
Blood, UA: NEGATIVE
Glucose, UA: NEGATIVE
Spec Grav, UA: 1.015
Urobilinogen, UA: 0.2
pH, UA: 6.5

## 2010-11-04 LAB — BASIC METABOLIC PANEL
CO2: 26 mEq/L (ref 19–32)
Calcium: 9.1 mg/dL (ref 8.4–10.5)
Glucose, Bld: 90 mg/dL (ref 70–99)
Potassium: 3.8 mEq/L (ref 3.5–5.1)
Sodium: 140 mEq/L (ref 135–145)

## 2010-11-04 LAB — CBC WITH DIFFERENTIAL/PLATELET
Basophils Relative: 0.6 % (ref 0.0–3.0)
Eosinophils Absolute: 0.1 10*3/uL (ref 0.0–0.7)
HCT: 46.5 % (ref 39.0–52.0)
Hemoglobin: 15.1 g/dL (ref 13.0–17.0)
Lymphocytes Relative: 38.4 % (ref 12.0–46.0)
Lymphs Abs: 2.2 10*3/uL (ref 0.7–4.0)
MCHC: 32.6 g/dL (ref 30.0–36.0)
Monocytes Relative: 8 % (ref 3.0–12.0)
Neutro Abs: 3 10*3/uL (ref 1.4–7.7)
RBC: 5.85 Mil/uL — ABNORMAL HIGH (ref 4.22–5.81)
RDW: 15.2 % — ABNORMAL HIGH (ref 11.5–14.6)

## 2010-11-04 LAB — HEPATIC FUNCTION PANEL
Alkaline Phosphatase: 59 U/L (ref 39–117)
Bilirubin, Direct: 0.1 mg/dL (ref 0.0–0.3)
Total Bilirubin: 0.6 mg/dL (ref 0.3–1.2)

## 2010-11-04 LAB — MICROALBUMIN / CREATININE URINE RATIO: Microalb, Ur: 0.5 mg/dL (ref 0.0–1.9)

## 2010-11-04 LAB — LDL CHOLESTEROL, DIRECT: Direct LDL: 148.1 mg/dL

## 2010-11-04 LAB — LIPID PANEL
HDL: 48 mg/dL (ref >39.00)
VLDL: 23.4 mg/dL (ref 0.0–40.0)

## 2010-11-05 LAB — HEMOGLOBIN A1C: Hgb A1c MFr Bld: 7.1 % — ABNORMAL HIGH (ref 4.6–6.5)

## 2010-11-17 ENCOUNTER — Encounter: Payer: Self-pay | Admitting: Internal Medicine

## 2010-12-08 ENCOUNTER — Encounter: Payer: Self-pay | Admitting: Internal Medicine

## 2010-12-22 ENCOUNTER — Ambulatory Visit (INDEPENDENT_AMBULATORY_CARE_PROVIDER_SITE_OTHER): Payer: 59 | Admitting: Internal Medicine

## 2010-12-22 ENCOUNTER — Encounter: Payer: Self-pay | Admitting: Internal Medicine

## 2010-12-22 VITALS — BP 124/80 | HR 92 | Temp 98.1°F | Ht 70.0 in | Wt 266.0 lb

## 2010-12-22 DIAGNOSIS — E119 Type 2 diabetes mellitus without complications: Secondary | ICD-10-CM

## 2010-12-22 DIAGNOSIS — E1142 Type 2 diabetes mellitus with diabetic polyneuropathy: Secondary | ICD-10-CM | POA: Insufficient documentation

## 2010-12-22 DIAGNOSIS — Z23 Encounter for immunization: Secondary | ICD-10-CM

## 2010-12-22 DIAGNOSIS — M79609 Pain in unspecified limb: Secondary | ICD-10-CM

## 2010-12-22 DIAGNOSIS — M79673 Pain in unspecified foot: Secondary | ICD-10-CM

## 2010-12-22 DIAGNOSIS — Z0000 Encounter for general adult medical examination without abnormal findings: Secondary | ICD-10-CM | POA: Insufficient documentation

## 2010-12-22 MED ORDER — METFORMIN HCL 500 MG PO TABS: 1000.0000 mg | ORAL_TABLET | Freq: Two times a day (BID) | ORAL | Status: DC

## 2010-12-22 MED ORDER — PRAVASTATIN SODIUM 40 MG PO TABS: 40.0000 mg | ORAL_TABLET | Freq: Every day | ORAL | Status: DC

## 2010-12-22 NOTE — Progress Notes (Signed)
Subjective:    Patient ID: [name], male    DOB: 01-Jun-1970, 40 y.o.   MRN: [id number]  HPI  40 year old African American male for routine physical. Interval medical history-patient was seen at the [facility] hospital. He was diagnosed with type 2 diabetes. He has met with diabetic educator and was started on metformin 500 mg twice daily. He is tolerating this well without any significant gastrointestinal side effects.  Hyperlipidemia-patient was switched to pravastatin 10 mg once daily. Patient's fasting lipid panel results are reviewed in detail.  Patient complains of persistent bilateral foot pain. He has had foot surgery performed by local podiatrist. He also complains of bilateral knee pain. Despite using orthotics and wearing special shoes it has not helped his foot pain and he requests second opinion from orthopedic specialists.  Review of Systems   Constitutional: Negative for activity change, appetite change and unexpected weight change. He has actually lost approximately 20-30 pounds within the last 6 months Eyes: Negative for visual disturbance.  Respiratory: Negative for cough, chest tightness and shortness of breath.   Cardiovascular: Negative for chest pain.  Genitourinary: Negative for difficulty urinating.  Neurological: Negative for headaches.  Gastrointestinal: Negative for abdominal pain, heartburn melena or hematochezia Psych: Negative for depression or anxiety  Past Medical History  Diagnosis Date  . Allergy   . Hyperlipidemia   . Gout   . Seizure disorder   . H/O: CVA (cardiovascular accident)   . LBP (low back pain)   . Knee pain, bilateral   . Plantar fasciitis     followed by Dr Harriet Pho    History   Social History  . Marital Status: Married    Spouse Name: N/A    Number of Children: N/A  . Years of Education: N/A   Occupational History  . Not on file.   Social History Main Topics  . Smoking status: Former Smoker    Types: Cigarettes   Quit date: 12/22/1990  . Smokeless tobacco: Not on file  . Alcohol Use: No  . Drug Use: No  . Sexually Active: Not on file   Other Topics Concern  . Not on file   Social History Narrative  . No narrative on file    No past surgical history on file.  Family History  Problem Relation Age of Onset  . Diabetes Father   . Hypertension Father   . Stroke Maternal Grandfather     Allergies  Allergen Reactions  . Atorvastatin     Current Outpatient Prescriptions on File Prior to Visit  Medication Sig Dispense Refill  . cyclobenzaprine (FLEXERIL) 5 MG tablet Take 5 mg by mouth at bedtime as needed.        . fish oil-omega-3 fatty acids 1000 MG capsule Take 1 g by mouth 3 (three) times daily.          BP 124/80  Pulse 92  Temp(Src) 98.1 F (36.7 C) (Oral)  Ht 5\' 10"  (1.778 m)  Wt 266 lb (120.657 kg)  BMI 38.17 kg/m2      Objective:   Physical Exam   Constitutional: Pleasant, overweight appearing, no distress  Head: Normocephalic and atraumatic.  Ear:  Right and left ear normal.  TMs clear.  Hearing is grossly normal Mouth/Throat: Oropharynx is clear and moist.  Eyes: Conjunctivae are normal. Pupils are equal, round, and reactive to light.  Neck: Normal range of motion. Neck supple. No thyromegaly present. No carotid bruit Cardiovascular: Normal rate, regular rhythm and normal heart sounds.  Exam reveals no gallop and no friction rub.  No murmur heard. Pulmonary/Chest: Effort normal and breath sounds normal.  No wheezes. No rales.  Abdominal: Soft. Bowel sounds are normal. No mass. There is no tenderness.  Neurological: Alert. No cranial nerve deficit.  Musculoskeletal: Bilateral heel tenderness Skin: Skin is warm and dry.  Psychiatric: Normal mood and affect. Behavior is normal.       Assessment & Plan:

## 2010-12-22 NOTE — Assessment & Plan Note (Signed)
Reviewed adult health maintenance protocols. Pt counseled on need for further weight loss.  Pt advised to decrease his carbohydrate intake to 25-30 g per meal. Patient reports he was recently updated with influenza vaccine.

## 2010-12-22 NOTE — Patient Instructions (Signed)
Please complete the following lab tests before your next follow up appointment: Bmet, A1c - 250.00 Lipid panel, LFTs - 272.4

## 2010-12-22 NOTE — Assessment & Plan Note (Addendum)
Increase metformin to 1000 mg twice daily. Aggressive lifestyle changes encouraged. His blood pressure is normal and microalbumin is negative. Hold off on ACE inhibitor or ARB for now. Refer for diabetic eye exam. Lab Results  Component Value Date   HGBA1C 7.1* 11/04/2010

## 2010-12-22 NOTE — Assessment & Plan Note (Signed)
Patient complains of persistent bilateral heel pain despite the surgery performed by local podiatrist. He requests second opinion from orthopedic specialist. Refer to Dr. Victorino Dike.

## 2011-01-13 ENCOUNTER — Ambulatory Visit (INDEPENDENT_AMBULATORY_CARE_PROVIDER_SITE_OTHER): Payer: 59 | Admitting: Pulmonary Disease

## 2011-01-13 ENCOUNTER — Encounter: Payer: Self-pay | Admitting: Pulmonary Disease

## 2011-01-13 VITALS — BP 110/82 | HR 98 | Temp 98.2°F | Ht 70.0 in | Wt 272.0 lb

## 2011-01-13 DIAGNOSIS — G4733 Obstructive sleep apnea (adult) (pediatric): Secondary | ICD-10-CM

## 2011-01-13 NOTE — Patient Instructions (Signed)
Will arrange for new supplies and mask, and would also like to re-optimize your pressure. Work on weight loss followup with me in one year if doing well.

## 2011-01-13 NOTE — Progress Notes (Signed)
  Subjective:    Patient ID: [name], male    DOB: 1970/02/01, 40 y.o.   MRN: [id number]  HPI The patient is a 40 year old male who I have been asked to see to reestablish for management of obstructive sleep apnea.  He was diagnosed with severe OSA in 2006, with an AHI of 78 of this per hour.  He was started on CPAP, and has stayed on this compliantly since that time.  However, he has not followed with me.  Despite wearing his CPAP, he continues to have inappropriate daytime sleepiness, but also admits that he does not get enough sleep on some days.  He feels that he is more rested when wearing CPAP, and his bed partner has not heard any breakthrough snoring or apneas.  He is using a fullface mask that is over due for replacement.  The patient states that his weight has been up and down over the last few years, and his Epworth score today is 14.  Sleep Questionnaire: What time do you typically go to bed?( Between what hours) 11:00pm-1:30am How long does it take you to fall asleep? 15-30 minutes How many times during the night do you wake up? 3 What time do you get out of bed to start your day? 0500 Do you drive or operate heavy machinery in your occupation? No How much has your weight changed (up or down) over the past two years? (In pounds) 20 lb (9.072 kg) Have you ever had a sleep study before? Yes If yes, location of study? [facility] If yes, date of study? 2005 Do you currently use CPAP? Yes If so, what pressure? not sure Do you wear oxygen at any time? No    Review of Systems  Constitutional: Positive for unexpected weight change. Negative for fever.  HENT: Positive for congestion. Negative for ear pain, nosebleeds, sore throat, rhinorrhea, sneezing, trouble swallowing, dental problem, postnasal drip and sinus pressure.   Eyes: Negative for redness and itching.  Respiratory: Negative for cough, chest tightness, shortness of breath and wheezing.   Cardiovascular: Positive for leg swelling.  Negative for palpitations.  Gastrointestinal: Negative for nausea and vomiting.  Genitourinary: Negative for dysuria.  Musculoskeletal: Positive for joint swelling.  Skin: Negative for rash.  Neurological: Negative for headaches.  Hematological: Does not bruise/bleed easily.  Psychiatric/Behavioral: Negative for dysphoric mood. The patient is not nervous/anxious.        Objective:   Physical Exam Constitutional:  Obese male, no acute distress  HENT:  Nares patent without discharge, but deviation to left with narrowing  Oropharynx without exudate, palate and uvula are thick and elongated.   Eyes:  Perrla, eomi, no scleral icterus  Neck:  No JVD, no TMG  Cardiovascular:  Normal rate, regular rhythm, no rubs or gallops.  No murmurs        Intact distal pulses  Pulmonary :  Normal breath sounds, no stridor or respiratory distress   No rales, rhonchi, or wheezing  Abdominal:  Soft, nondistended, bowel sounds present.  No tenderness noted.   Musculoskeletal:  No lower extremity edema noted.  Lymph Nodes:  No cervical lymphadenopathy noted  Skin:  No cyanosis noted  Neurologic:  Alert, appropriate, moves all 4 extremities without obvious deficit.         Assessment & Plan:

## 2011-01-13 NOTE — Assessment & Plan Note (Addendum)
The patient has a history of severe obstructive sleep apnea, and has been wearing CPAP compliantly.  He feels that he is doing fairly well with the device, but is overdue for a new mask and supplies.  His machine is also at the end of its typical lifespan, and he is having issues with the on/off button.  I would like to have his DME company checked his machine, and if working properly will optimize his pressure for him again.  If it is not working properly, we'll get him a new machine.  I have also encouraged him to work aggressively on weight loss, and to try and get more sleep consistently at night.

## 2011-02-27 ENCOUNTER — Other Ambulatory Visit: Payer: Self-pay | Admitting: Pulmonary Disease

## 2011-02-27 DIAGNOSIS — G4733 Obstructive sleep apnea (adult) (pediatric): Secondary | ICD-10-CM

## 2011-03-18 ENCOUNTER — Other Ambulatory Visit (INDEPENDENT_AMBULATORY_CARE_PROVIDER_SITE_OTHER): Payer: 59

## 2011-03-18 DIAGNOSIS — E119 Type 2 diabetes mellitus without complications: Secondary | ICD-10-CM

## 2011-03-18 DIAGNOSIS — E785 Hyperlipidemia, unspecified: Secondary | ICD-10-CM

## 2011-03-18 LAB — BASIC METABOLIC PANEL
Calcium: 9.4 mg/dL (ref 8.4–10.5)
GFR: 113.31 mL/min (ref >60.00)
Sodium: 141 mEq/L (ref 135–145)

## 2011-03-18 LAB — HEPATIC FUNCTION PANEL
ALT: 25 U/L (ref 0–53)
AST: 24 U/L (ref 0–37)
Albumin: 4.3 g/dL (ref 3.5–5.2)
Total Protein: 7.9 g/dL (ref 6.0–8.3)

## 2011-03-18 LAB — LIPID PANEL
Cholesterol: 188 mg/dL (ref 0–200)
Total CHOL/HDL Ratio: 4
Triglycerides: 143 mg/dL (ref 0.0–149.0)

## 2011-03-25 ENCOUNTER — Ambulatory Visit (INDEPENDENT_AMBULATORY_CARE_PROVIDER_SITE_OTHER): Payer: 59 | Admitting: Internal Medicine

## 2011-03-25 ENCOUNTER — Encounter: Payer: Self-pay | Admitting: Internal Medicine

## 2011-03-25 DIAGNOSIS — E785 Hyperlipidemia, unspecified: Secondary | ICD-10-CM

## 2011-03-25 DIAGNOSIS — E119 Type 2 diabetes mellitus without complications: Secondary | ICD-10-CM

## 2011-03-25 NOTE — Assessment & Plan Note (Signed)
His LDL is near goal.  Handout for low saturated fat diet provided.  Continue pravastatin.  He has had difficulty with tolerating lipitor and crestor in the past.

## 2011-03-25 NOTE — Patient Instructions (Signed)
Please complete the following lab tests before your next follow up appointment: BMET, A1c - 250.00 

## 2011-03-25 NOTE — Progress Notes (Signed)
  Subjective:    Patient ID: [name], male    DOB: 01/19/1971, 41 y.o.   MRN: [id number]  HPI  73 African American male for followup regarding type 2 diabetes and hyperlipidemia. Patient reports good dietary compliance. He has limited ability to exercise due to chronic back pain and lower extremity pain.  Patient is tolerating metformin well. His A1c has improved.  Hyperlipidemia-he has almost completely eliminated red meat from his diet. He is tolerating pravastatin without difficulty.  Review of Systems Negative for chest pain  Past Medical History  Diagnosis Date  . Allergy   . Hyperlipidemia   . Gout   . Seizure disorder   . H/O: CVA (cardiovascular accident)   . LBP (low back pain)   . Knee pain, bilateral   . Plantar fasciitis     followed by Dr Harriet Pho    History   Social History  . Marital Status: Married    Spouse Name: N/A    Number of Children: 2  . Years of Education: N/A   Occupational History  . Production designer, theatre/television/film and clergy    Social History Main Topics  . Smoking status: Former Smoker -- 4 years    Types: Cigarettes    Quit date: 12/22/1990  . Smokeless tobacco: Not on file   Comment: 1-2 packs a week  . Alcohol Use: No  . Drug Use: No  . Sexually Active: Not on file   Other Topics Concern  . Not on file   Social History Narrative  . No narrative on file    Past Surgical History  Procedure Date  . Right shoulder surgery     x2  . Left heel surgery     x2    Family History  Problem Relation Age of Onset  . Diabetes Father   . Hypertension Father   . Heart attack Maternal Grandfather   . Cancer Maternal Uncle   . Allergies Daughter     Allergies  Allergen Reactions  . Atorvastatin     Current Outpatient Prescriptions on File Prior to Visit  Medication Sig Dispense Refill  . Cholecalciferol (VITAMIN D) 2000 UNITS CAPS Take by mouth.        . cyclobenzaprine (FLEXERIL) 5 MG tablet Take 5 mg by mouth at bedtime as needed.         . fish oil-omega-3 fatty acids 1000 MG capsule Take 1 g by mouth 3 (three) times daily.        . metFORMIN (GLUCOPHAGE) 500 MG tablet Take 2 tablets (1,000 mg total) by mouth 2 (two) times daily with a meal.  180 tablet  1  . pravastatin (PRAVACHOL) 40 MG tablet Take 1 tablet (40 mg total) by mouth daily.  90 tablet  1    BP 112/86  Pulse 93  Temp(Src) 98.7 F (37.1 C) (Oral)  Wt 262 lb (118.842 kg)  SpO2 98%       Objective:   Physical Exam  Constitutional: He appears well-developed and well-nourished.  Cardiovascular: Normal rate, regular rhythm and normal heart sounds.   Pulmonary/Chest: Effort normal and breath sounds normal. He has no wheezes. He has no rales.  Musculoskeletal: He exhibits no edema.  Skin: Skin is warm and dry.  Psychiatric: He has a normal mood and affect. His behavior is normal.          Assessment & Plan:

## 2011-03-25 NOTE — Assessment & Plan Note (Signed)
Improved control.  Continue metformin.  I encouraged regular exercises that are not weight bearing. Lab Results  Component Value Date   HGBA1C 6.6* 03/18/2011

## 2011-07-22 ENCOUNTER — Ambulatory Visit (INDEPENDENT_AMBULATORY_CARE_PROVIDER_SITE_OTHER): Payer: 59 | Admitting: Internal Medicine

## 2011-07-22 ENCOUNTER — Encounter: Payer: Self-pay | Admitting: Internal Medicine

## 2011-07-22 VITALS — BP 126/92 | HR 96 | Temp 98.4°F | Wt 269.0 lb

## 2011-07-22 DIAGNOSIS — H6990 Unspecified Eustachian tube disorder, unspecified ear: Secondary | ICD-10-CM | POA: Insufficient documentation

## 2011-07-22 DIAGNOSIS — H6980 Other specified disorders of Eustachian tube, unspecified ear: Secondary | ICD-10-CM

## 2011-07-22 DIAGNOSIS — E119 Type 2 diabetes mellitus without complications: Secondary | ICD-10-CM

## 2011-07-22 DIAGNOSIS — M545 Low back pain, unspecified: Secondary | ICD-10-CM

## 2011-07-22 DIAGNOSIS — M25569 Pain in unspecified knee: Secondary | ICD-10-CM

## 2011-07-22 MED ORDER — METFORMIN HCL 500 MG PO TABS: 1000.0000 mg | ORAL_TABLET | Freq: Two times a day (BID) | ORAL | Status: DC

## 2011-07-22 MED ORDER — FLUTICASONE PROPIONATE 50 MCG/ACT NA SUSP: NASAL | Status: DC

## 2011-07-22 MED ORDER — PRAVASTATIN SODIUM 40 MG PO TABS: 40.0000 mg | ORAL_TABLET | Freq: Every day | ORAL | Status: DC

## 2011-07-22 NOTE — Assessment & Plan Note (Signed)
I doubt knee and foot pain related to lumbar disease.  Follow up with orthopedic specialist.

## 2011-07-22 NOTE — Progress Notes (Signed)
Subjective:    Patient ID: [name], male    DOB: [birth year], 41 y.o.   MRN: [id number]  HPI  41 year old [place] American male with history of type 2 diabetes and hyperlipidemia for routine followup. In terms of his blood sugars, they have been very stable. His morning blood sugar readings are between 90 and 120. He is tolerating metformin without difficulty.  Patient reports being seen by rheumatologist at [facility] regarding chronic bilateral knee pain and foot pain. Rheumatoid arthritis was ruled out. Patient continues to have severe limitation with walking and climbing stairs due to bilateral knee pain. Symptoms are localized to area just above his kneecap. He has been using Relafen 750 mg twice daily as well as tramadol 50 mg every 8 hours without sufficient pain relief. He has followup with orthopedic specialist within 1 to 2 weeks.  Patient also complains of right-sided headache and earache since Monday. He has mild fatigue. He notes increased nasal congestion. He denies fever or chills.  Review of Systems Negative for fever or chills,  Positive nasal congestion.  Mild right ear ache   Past Medical History  Diagnosis Date  . Allergy   . Hyperlipidemia   . Gout   . Seizure disorder   . H/O: CVA (cardiovascular accident)   . LBP (low back pain)   . Knee pain, bilateral   . Plantar fasciitis     followed by Dr Harriet Pho    History   Social History  . Marital Status: Married    Spouse Name: N/A    Number of Children: 2  . Years of Education: N/A   Occupational History  . Production designer, theatre/television/film and clergy    Social History Main Topics  . Smoking status: Former Smoker -- 4 years    Types: Cigarettes    Quit date: 12/22/1990  . Smokeless tobacco: Not on file   Comment: 1-2 packs a week  . Alcohol Use: No  . Drug Use: No  . Sexually Active: Not on file   Other Topics Concern  . Not on file   Social History Narrative  . No narrative on file    Past Surgical History    Procedure Date  . Right shoulder surgery     x2  . Left heel surgery     x2    Family History  Problem Relation Age of Onset  . Diabetes Father   . Hypertension Father   . Heart attack Maternal Grandfather   . Cancer Maternal Uncle   . Allergies Daughter     Allergies  Allergen Reactions  . Atorvastatin     Current Outpatient Prescriptions on File Prior to Visit  Medication Sig Dispense Refill  . Cholecalciferol (VITAMIN D) 2000 UNITS CAPS Take by mouth.        . fish oil-omega-3 fatty acids 1000 MG capsule Take 1 g by mouth 3 (three) times daily.        . nabumetone (RELAFEN) 750 MG tablet Take 750 mg by mouth 2 (two) times daily.      . traMADol (ULTRAM) 50 MG tablet Take 50 mg by mouth every 8 (eight) hours as needed.      Marland Kitchen DISCONTD: metFORMIN (GLUCOPHAGE) 500 MG tablet Take 2 tablets (1,000 mg total) by mouth 2 (two) times daily with a meal.  180 tablet  1  . DISCONTD: pravastatin (PRAVACHOL) 40 MG tablet Take 1 tablet (40 mg total) by mouth daily.  90 tablet  1  . fluticasone (FLONASE)  50 MCG/ACT nasal spray 2 sprays to each nostril once daily  16 g  3    BP 126/92  Pulse 96  Temp 98.4 F (36.9 C) (Oral)  Wt 269 lb (122.018 kg)       Objective:   Physical Exam  Constitutional: He is oriented to person, place, and time. He appears well-developed and well-nourished.  HENT:  Head: Normocephalic and atraumatic.  Right Ear: External ear normal.  Left Ear: External ear normal.  Mouth/Throat: Oropharynx is clear and moist. No oropharyngeal exudate.  Neck: Neck supple.  Cardiovascular: Normal rate, regular rhythm and normal heart sounds.   Pulmonary/Chest: Effort normal and breath sounds normal. He has no wheezes.  Musculoskeletal: He exhibits no edema.       Tenderness of superior aspect of bilateral knee caps Tenderness of bilateral heels and arches  Lymphadenopathy:    He has no cervical adenopathy.  Neurological: He is alert and oriented to person, place,  and time.  Skin: Skin is warm and dry.  Psychiatric: He has a normal mood and affect. His behavior is normal.       Assessment & Plan:

## 2011-07-22 NOTE — Assessment & Plan Note (Signed)
Home blood sugar readings are stable. Continue metformin 1000 mg twice daily. Monitor A1c. Foot exam is normal.

## 2011-07-22 NOTE — Assessment & Plan Note (Signed)
He has symptoms of right-sided headaches and ear pain. Symptoms consistent with eustachian tube dysfunction. Use fluticasone nasal spray as directed.  Patient advised to call office if symptoms persist or worsen.

## 2011-07-22 NOTE — Assessment & Plan Note (Signed)
The patient was seen by rheumatologist at  [facility]. His workup negative for rheumatoid arthritis. He has pain predominantly in the superior aspect of patella bilaterally. Question chondromalacia. He has followup with his orthopedic specialist. MRI of knees should be considered. He is having poor pain management with current regimen of Relafen and tramadol. I suggest referral to pain management specialist.

## 2011-07-23 LAB — BASIC METABOLIC PANEL
BUN: 14 mg/dL (ref 6–23)
Calcium: 10 mg/dL (ref 8.4–10.5)
Creat: 1.04 mg/dL (ref 0.50–1.35)
Potassium: 4.2 mEq/L (ref 3.5–5.3)

## 2011-07-23 LAB — HEMOGLOBIN A1C: Hgb A1c MFr Bld: 6.3 % — ABNORMAL HIGH (ref <5.7)

## 2011-07-25 ENCOUNTER — Telehealth: Payer: Self-pay | Admitting: Internal Medicine

## 2011-07-25 NOTE — Telephone Encounter (Signed)
Pt aware of lab results 

## 2011-07-25 NOTE — Telephone Encounter (Signed)
Patient was returning a call from [place] regarding lab results. He will be available on his mobile phone for a return call.

## 2011-07-27 ENCOUNTER — Encounter: Payer: Self-pay | Admitting: Physical Medicine & Rehabilitation

## 2011-08-02 ENCOUNTER — Telehealth: Payer: Self-pay | Admitting: Physical Medicine & Rehabilitation

## 2011-08-02 ENCOUNTER — Ambulatory Visit: Payer: 59 | Admitting: Physical Medicine & Rehabilitation

## 2011-08-02 ENCOUNTER — Encounter: Payer: Self-pay | Admitting: Physical Medicine & Rehabilitation

## 2011-08-02 ENCOUNTER — Encounter: Payer: 59 | Attending: Physical Medicine & Rehabilitation | Admitting: Physical Medicine & Rehabilitation

## 2011-08-02 ENCOUNTER — Ambulatory Visit (HOSPITAL_COMMUNITY)
Admission: RE | Admit: 2011-08-02 | Discharge: 2011-08-02 | Disposition: A | Discharge status: Home / Self Care | Payer: 59 | Source: Ambulatory Visit | Attending: Physical Medicine & Rehabilitation | Admitting: Physical Medicine & Rehabilitation

## 2011-08-02 VITALS — BP 152/64 | HR 83 | Resp 16 | Ht 70.0 in | Wt 265.0 lb

## 2011-08-02 DIAGNOSIS — E669 Obesity, unspecified: Secondary | ICD-10-CM | POA: Insufficient documentation

## 2011-08-02 DIAGNOSIS — M549 Dorsalgia, unspecified: Secondary | ICD-10-CM | POA: Insufficient documentation

## 2011-08-02 DIAGNOSIS — M47816 Spondylosis without myelopathy or radiculopathy, lumbar region: Secondary | ICD-10-CM | POA: Insufficient documentation

## 2011-08-02 DIAGNOSIS — Z8679 Personal history of other diseases of the circulatory system: Secondary | ICD-10-CM

## 2011-08-02 DIAGNOSIS — R269 Unspecified abnormalities of gait and mobility: Secondary | ICD-10-CM | POA: Insufficient documentation

## 2011-08-02 DIAGNOSIS — M47817 Spondylosis without myelopathy or radiculopathy, lumbosacral region: Secondary | ICD-10-CM | POA: Insufficient documentation

## 2011-08-02 DIAGNOSIS — M76899 Other specified enthesopathies of unspecified lower limb, excluding foot: Secondary | ICD-10-CM | POA: Insufficient documentation

## 2011-08-02 DIAGNOSIS — R209 Unspecified disturbances of skin sensation: Secondary | ICD-10-CM | POA: Insufficient documentation

## 2011-08-02 DIAGNOSIS — M6580 Other synovitis and tenosynovitis, unspecified site: Secondary | ICD-10-CM | POA: Insufficient documentation

## 2011-08-02 DIAGNOSIS — G4733 Obstructive sleep apnea (adult) (pediatric): Secondary | ICD-10-CM

## 2011-08-02 MED ORDER — MELOXICAM 15 MG PO TABS: 15.0000 mg | ORAL_TABLET | Freq: Every day | ORAL | Status: DC

## 2011-08-02 MED ORDER — METHOCARBAMOL 750 MG PO TABS: 750.0000 mg | ORAL_TABLET | Freq: Four times a day (QID) | ORAL | Status: AC

## 2011-08-02 NOTE — Addendum Note (Signed)
Addended by: Doreene Eland on: 08/02/2011 01:25 PM   Modules accepted: Orders

## 2011-08-02 NOTE — Progress Notes (Signed)
Subjective:    Patient ID: [name], male    DOB: 1970-06-09, 41 y.o.   MRN: [id number]  HPI  This is a pleasant 41 yo african [place] male referred here by Dr. 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 He had numbness running from both legs to both shoulders. Per the patient, he was diagnosed with a lumbar bulging disc and back strain. For treatment he was given NSAID, musle relaxants, pain pills, therapy. He remained active but on sedentary duty. He ultimately was discharged honorably in 1993 due to his ongoing pain.   He saw a civilian doctor who recommended surgery after he left the service. He declined surgery, and essentially he's been "dealing" with the pain since then. His last MRI was in 2004 apparently. It revealed: RUDIMENTARY DISK AT THE S1-2 LEVEL WITH THE LAST FULLY OPEN DISK SPACE AT THE L5-S1 LEVEL. THIS  LEVEL ASSIGNMENT WILL NEED TO BE UNDERSTOOD BY ANYONE INVOLVED IN THE PATIENT'S FUTURE CARE AND  CONFIRMED WITH PLAIN FILMS PRIOR TO ANY INTERVENTION. BY THIS LEVEL ASSIGNMENT, CONUS IS AT THE  LOWER L2 LEVEL. T11-12 THROUGH L4-5 UNREMARKABLE.  L5-S1: MODERATE BULGE. MILD BILATERAL L5-S1 NEURAL FORAMINAL NARROWING AND SPINAL STENOSIS.  IMPRESSION  TRANSITIONAL VERTEBRA HAS BEEN LABELED S1 AS DESCRIBED ABOVE.  L5-S1 MODERATE BULGE WITH MILD BILATERAL NEURAL FORAMINAL NARROWING AND MILD SPINAL STENOSIS.  He  tried to work multiple physical jobs but wasn't  able to complete them due to his back. He's put on weight over the last several years as well due to his decreased activity.  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 He is currently married with 2 children and doesn't drink or smoke. He is also involved in ministry as well.  From a standpoint, twisting, bending, supine position, walking, any exercise,  lifting exacerbate his central low back pain. HIs knees have also bothered him starting about 3 years ago. They bother him most when he bends, climbs stairs, arises from a seated postiion, driving. Pain is most prominent over the patella. He has associated throbbing, swelling and burning.   He tells me he saw Dr. Allie Bossier yesterday for both of these problems. He apparently was put back on physical therapy for his knees.   As far as medications are concerned he has used flexeril for back pain (unhelpful), tramadol (neglible), relafen (no benefit). He was on xylocaine and voltaren gel also which didn't help.      Pain Inventory Average Pain 6 Pain Right Now 6 My pain is constant, sharp, stabbing and aching  In the last 24 hours, has pain interfered with the following? General activity 2 Relation with others 2 Enjoyment of life 2 What TIME of day is your pain at its worst? All Day Sleep (in general) Poor  Pain is worse with: bending, sitting, inactivity, standing and some activites Pain improves with: rest Relief from Meds: 1  Mobility walk without assistance use a cane ability to climb steps?  no do you drive?  yes  Function employed # of hrs/week 45  Neuro/Psych weakness numbness tingling trouble walking spasms  Prior Studies x-rays  Physicians involved in your care Primary care  Rheumatologist  Orthopedist    Family History  Problem Relation Age of Onset  . Diabetes Father   . Hypertension Father   .  Heart attack Maternal Grandfather   . Cancer Maternal Uncle   . Allergies Daughter    History   Social History  . Marital Status: Married    Spouse Name: N/A    Number of Children: 2  . Years of Education: N/A   Occupational History  . Production designer, theatre/television/film and clergy    Social History Main Topics  . Smoking status: Former Smoker -- 4 years    Types: Cigarettes    Quit date: 12/22/1990  . Smokeless tobacco: None   Comment: 1-2 packs a week  .  Alcohol Use: No  . Drug Use: No  . Sexually Active: None   Other Topics Concern  . None   Social History Narrative  . None   Past Surgical History  Procedure Date  . Right shoulder surgery     x2  . Left heel surgery     x2   Past Medical History  Diagnosis Date  . Allergy   . Hyperlipidemia   . Gout   . Seizure disorder   . H/O: CVA (cardiovascular accident)   . LBP (low back pain)   . Knee pain, bilateral   . Plantar fasciitis     followed by Dr Harriet Pho   BP 152/64  Pulse 83  Resp 16  Ht 5\' 10"  (1.778 m)  Wt 265 lb (120.203 kg)  BMI 38.02 kg/m2  SpO2 94%     Review of Systems  Constitutional: Positive for unexpected weight change.  HENT: Negative.   Eyes: Negative.   Respiratory: Positive for apnea.   Cardiovascular: Positive for leg swelling.  Gastrointestinal: Negative.   Genitourinary: Negative.   Musculoskeletal: Positive for back pain and gait problem.  Skin: Negative.   Neurological: Positive for weakness and numbness.       Tingling and Spasms  Hematological: Negative.   Psychiatric/Behavioral: Negative.        Objective:   Physical Exam  Constitutional: He is oriented to person, place, and time. He appears well-developed and well-nourished.       Moderately obese  HENT:  Head: Normocephalic and atraumatic.  Right Ear: External ear normal.  Left Ear: External ear normal.  Nose: Nose normal.  Mouth/Throat: Oropharynx is clear and moist.  Eyes: Conjunctivae are normal. Pupils are equal, round, and reactive to light.  Neck: Normal range of motion. Neck supple.  Cardiovascular: Normal rate and regular rhythm.   Pulmonary/Chest: Effort normal and breath sounds normal. No respiratory distress.  Abdominal: Soft.  Musculoskeletal:       On visual examination of the patient, he has elevation of the left shoulder of the right with no protraction of the right scapula. With cueing he does correct to a certain extent but needs tactile cueing to  come to neutral. He has fair arm positioning of the spine as I saw no gross curvature. Low back is tender over the lower lumbar segments particularly on the right at L4-L5 and L5-S1 with associated muscle spasm. Flexion was achieved at the low back to about 60 with pain. Extension is about 5 or 10 caused pain on the right more than left. Right side bending and twisting cause significant pain. Facet maneuvers were positive on the right more than left. Straight leg testing was equivocal. Pelvic posture was fair.  Patient had pain over both knees particularly on the superior medial aspects. He had an obvious deformity at the right knee more than left. Q- angle appeared to be within normal range. No significant crepitus  was appreciated with range of motion. No joint effusions were seen. He did have noticeable pain when moving from a seated to standing position. There is no gross antalgic noted with walking.  Neurological: He is alert and oriented to person, place, and time. He has normal strength. A sensory deficit is present. No cranial nerve deficit. He displays a negative Romberg sign.       Pain inhibition noted with quadriceps extension bilaterally-  Psychiatric: He has a normal mood and affect. His behavior is normal. Judgment and thought content normal.          Assessment & Plan:  1. Lumbar spondylosis with likely facet arthropathy and disc disease 2. Bilateral quadriceps tendonitis, suprapatellar bursitis. Perhaps early medial compartment arthritis. 3. Moderate obesity.   Plan: 1. Xrays of lumbar spine ap and lateral to assess facets and disc spaces. 2. DC relafen- trial of mobic 15mg  qday 3. DC flexeril, trial of robaxin 500mg  q6 hours prn 4. Agree with PT for knees. Need also to work on improved posture, ROM, and HEP for his back. Modalities will be helpful for his knees but he needs to achieve better balance in his quadriceps strength also. 5. Weight loss is recommended, but I  understand his dilemma regarding his pain and inability to aerobically exerise. 6. I will see him back  in about one month.  I'd like to thank Dr. Artist Pais for the referral.

## 2011-08-02 NOTE — Telephone Encounter (Signed)
xrays of lumbar spine don't show dramatic changes from previous xrays and mri.  There is arthritis particularly posteriorly.  i would like therapy to work with his back, but i did not make the referral (dr. Magnus Ivan did and apparently his orders focused on his knees)  i would be happy to give PT further instructions for his back if he will have them contact me. (he's going to [facility] [address] for PT).  thanks

## 2011-08-02 NOTE — Patient Instructions (Signed)
Proceed with PT as directed.

## 2011-08-08 ENCOUNTER — Telehealth: Payer: Self-pay | Admitting: Physical Medicine & Rehabilitation

## 2011-08-08 NOTE — Telephone Encounter (Signed)
Lm for pt to call office regarding xray.

## 2011-08-08 NOTE — Telephone Encounter (Signed)
Pt will contact Robin his therapist and have her contact us with orders.   Please advise as to what you want pt to do so I can relay the message.

## 2011-08-15 ENCOUNTER — Telehealth: Payer: Self-pay | Admitting: Physical Medicine & Rehabilitation

## 2011-08-15 NOTE — Telephone Encounter (Signed)
Got a call back from [name] and I have faxed over his xrays for them to review. They will call back if they need any further information from [place].

## 2011-08-15 NOTE — Telephone Encounter (Signed)
Please call Dewaine Conger concerning patient [phone]

## 2011-08-15 NOTE — Telephone Encounter (Signed)
Called pt to see exactly what we were needing to call [name] about and he states that they need to know what his treatment plan is.   I called over to [name] and LM for physical therapist to call us back.

## 2011-08-30 ENCOUNTER — Encounter: Payer: 59 | Admitting: Physical Medicine & Rehabilitation

## 2011-08-31 ENCOUNTER — Encounter (HOSPITAL_BASED_OUTPATIENT_CLINIC_OR_DEPARTMENT_OTHER): Payer: 59 | Admitting: Physical Medicine & Rehabilitation

## 2011-08-31 ENCOUNTER — Encounter: Payer: Self-pay | Admitting: Physical Medicine & Rehabilitation

## 2011-08-31 VITALS — BP 115/57 | HR 100 | Resp 14 | Ht 70.0 in | Wt 263.0 lb

## 2011-08-31 DIAGNOSIS — M47817 Spondylosis without myelopathy or radiculopathy, lumbosacral region: Secondary | ICD-10-CM

## 2011-08-31 DIAGNOSIS — M47816 Spondylosis without myelopathy or radiculopathy, lumbar region: Secondary | ICD-10-CM

## 2011-08-31 DIAGNOSIS — M6580 Other synovitis and tenosynovitis, unspecified site: Secondary | ICD-10-CM

## 2011-08-31 DIAGNOSIS — M76899 Other specified enthesopathies of unspecified lower limb, excluding foot: Secondary | ICD-10-CM

## 2011-08-31 NOTE — Progress Notes (Signed)
Subjective:    Patient ID: [name], male    DOB: 1970/10/14, 41 y.o.   MRN: [id number]  HPI  Mr. [name] is back regarding his back and knee pain. He has gone through PT for his knees and feels that it has helped with his strength and knee stability although they are both sore. He feels that the PT perhaps has increased his low back pain. He is using the mobic and robaxin as i prescribed. He has resorted to using some tramadol at night to help him rest.   His back pain remains axial over the lower lumbar region.  His xrays were performed on 7/2 and revealed: Five lumbar-type vertebral bodies are normal in height  and alignment. There is a transitional vertebra, designated as S1,  as described on prior lumbar spine MRI, with a rudimentary disc at  the S1-S2 level. There is no fracture or pars defect. Posterior  osteophyte formation at the posterior inferior aspect of the L5  vertebral body narrows the spinal canal. This appears similar to  the radiographs of 2007. Sacroiliac joints appear normal.   Pain Inventory Average Pain 7 Pain Right Now 5 My pain is sharp, stabbing and aching  In the last 24 hours, has pain interfered with the following? General activity 5 Relation with others 5 Enjoyment of life 5 What TIME of day is your pain at its worst? all the time Sleep (in general) Poor  Pain is worse with: walking, bending, sitting and standing Pain improves with: rest, heat/ice and medication Relief from Meds: 4  Mobility walk without assistance use a cane how many minutes can you walk? 10-15 ability to climb steps?  yes do you drive?  yes Do you have any goals in this area?  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  yes  Neuro/Psych weakness numbness tingling spasms  Prior Studies Any changes since last visit?  no  Physicians involved in your care Any changes since last visit?  no   Family History    Problem Relation Age of Onset  . Diabetes Father   . Hypertension Father   . Heart attack Maternal Grandfather   . Cancer Maternal Uncle   . Allergies Daughter    History   Social History  . Marital Status: Married    Spouse Name: N/A    Number of Children: 2  . Years of Education: N/A   Occupational History  . Production designer, theatre/television/film and clergy    Social History Main Topics  . Smoking status: Former Smoker -- 4 years    Types: Cigarettes    Quit date: 12/22/1990  . Smokeless tobacco: None   Comment: 1-2 packs a week  . Alcohol Use: No  . Drug Use: No  . Sexually Active: None   Other Topics Concern  . None   Social History Narrative  . None   Past Surgical History  Procedure Date  . Right shoulder surgery     x2  . Left heel surgery     x2   Past Medical History  Diagnosis Date  . Allergy   . Hyperlipidemia   . Gout   . Seizure disorder   . H/O: CVA (cardiovascular accident)   . LBP (low back pain)   . Knee pain, bilateral   . Plantar fasciitis     followed by Dr Harriet Pho   BP 115/57  Pulse 100  Resp 14  Ht 5'  10" (1.778 m)  Wt 263 lb (119.296 kg)  BMI 37.74 kg/m2  SpO2 94%     Review of Systems  Constitutional: Positive for unexpected weight change.  Respiratory: Positive for apnea.   Musculoskeletal: Positive for myalgias, back pain and arthralgias.  Neurological: Positive for weakness and numbness.  All other systems reviewed and are negative.       Objective:   Physical Exam  Constitutional: He is oriented to person, place, and time. He appears well-developed and well-nourished.  Moderately obese  HENT:  Head: Normocephalic and atraumatic.  Right Ear: External ear normal.  Left Ear: External ear normal.  Nose: Nose normal.  Mouth/Throat: Oropharynx is clear and moist.  Eyes: Conjunctivae are normal. Pupils are equal, round, and reactive to light.  Neck: Normal range of motion. Neck supple.  Cardiovascular: Normal rate and regular  rhythm.  Pulmonary/Chest: Effort normal and breath sounds normal. No respiratory distress.  Abdominal: Soft.  Musculoskeletal:  On visual examination of the patient, he has elevation of the left shoulder of the right with no protraction of the right scapula. With cueing he does correct to a certain extent but needs tactile cueing to come to neutral. He has fair arm positioning of the spine as I saw no gross curvature. Low back is tender over the lower lumbar segments particularly on the right at L4-L5 and L5-S1 with associated muscle spasm. Flexion was achieved at the low back to about 60 with pain. Extension is about 5 or 10 caused pain on the right more than left. Right side bending and twisting cause significant pain. Facet maneuvers were positive on the right more than left. Straight leg testing was equivocal. Pelvic posture was fair.  Patient had pain over both knees particularly on the superior medial aspects seemed to be better. He had an obvious deformity at the right knee more than left. Q- angle appeared to be within normal range. No significant crepitus was appreciated with range of motion. No joint effusions were seen. He did have noticeable pain when moving from a seated to standing position. There is no gross antalgic noted with walking. He did have crepitus in the right knee with extension today. Neurological: He is alert and oriented to person, place, and time. He has normal strength. A sensory deficit is present. No cranial nerve deficit. He displays a negative Romberg sign.  Pain inhibition noted with quadriceps extension bilaterally-  Psychiatric: He has a normal mood and affect. His behavior is normal. Judgment and thought content normal.    Assessment & Plan:   1. Lumbar spondylosis with likely facet arthropathy and disc disease  2. Bilateral quadriceps tendonitis, suprapatellar bursitis. Perhaps early medial compartment arthritis.  3. Moderate obesity.  Plan:  1. Given his  xray findings i woud recommned continuing with outpatient therapies. They will be transitioning to back related activities next week apparently.  We can consider further intervention such as MBB's at L4-5 and L5-S1 at next visit.  2. Continue  mobic 15mg  qday  3.  robaxin 500mg  q6 hours prn  4. Agree with PT for knees. Need also to work on improved posture, ROM, and HEP for his back. Modalities will be helpful for his knees but he needs to achieve better balance in his quadriceps strength also.  5. Weight loss continues to be an important factor.  6. We discussed OTC supplements which might be of help for his knee pain including glucosamine, chondroitin, omega 3 fatty acids, etc. I will see him  back in about one month

## 2011-08-31 NOTE — Patient Instructions (Signed)
Glucosamine with chondroitin, omega 3 fatty acids can be helpful for knee pain. You can purchase these over the counter.

## 2011-09-21 ENCOUNTER — Ambulatory Visit: Payer: 59 | Admitting: Internal Medicine

## 2011-09-26 ENCOUNTER — Ambulatory Visit (INDEPENDENT_AMBULATORY_CARE_PROVIDER_SITE_OTHER): Payer: 59 | Admitting: Internal Medicine

## 2011-09-26 ENCOUNTER — Encounter: Payer: Self-pay | Admitting: Internal Medicine

## 2011-09-26 VITALS — BP 124/76 | HR 92 | Temp 98.9°F | Wt 265.0 lb

## 2011-09-26 DIAGNOSIS — M25569 Pain in unspecified knee: Secondary | ICD-10-CM

## 2011-09-26 DIAGNOSIS — E119 Type 2 diabetes mellitus without complications: Secondary | ICD-10-CM

## 2011-09-26 NOTE — Assessment & Plan Note (Signed)
Patient followed by orthopedic specialists and pain management. He is currently taking Relafen and meloxicam. Discontinue meloxicam. Continue physical therapy

## 2011-09-26 NOTE — Assessment & Plan Note (Signed)
Diabetes control is stable. His last A1c of 6.3. Patient advised to take 1000 mg of metformin twice daily.  Lab Results  Component Value Date   HGBA1C 6.3* 07/22/2011   Lab Results  Component Value Date   CREATININE 1.04 07/22/2011

## 2011-09-26 NOTE — Patient Instructions (Addendum)
Please complete the following lab tests before your next follow up appointment: BMET, A1c - 250.00 FLP, LFTs - 272.4 

## 2011-09-26 NOTE — Progress Notes (Signed)
Subjective:    Patient ID: [name], male    DOB: 1970/04/15, 41 y.o.   MRN: [id number]  HPI  41 year old African American male with type 2 diabetes chronic bilateral knee pain for followup. Patient is still followed by orthopedics specialist and pain management. Patient reports ongoing physical therapy for bilateral knee pain. Medication reconciliation performed in detail. Patient taking meloxicam 15 mg once daily as well as Relafen twice daily. He denies any stomach upset.  Type 2 diabetes-weight is stable. Patient taking 500 mg of metformin twice daily. Patient unable to exercise regularly due to a lower extremity pain. He is following kart modified diet.  Review of Systems Negative for chest pain  Past Medical History  Diagnosis Date  . Allergy   . Hyperlipidemia   . Gout   . Seizure disorder   . H/O: CVA (cardiovascular accident)   . LBP (low back pain)   . Knee pain, bilateral   . Plantar fasciitis     followed by Dr Harriet Pho    History   Social History  . Marital Status: Married    Spouse Name: N/A    Number of Children: 2  . Years of Education: N/A   Occupational History  . Production designer, theatre/television/film and clergy    Social History Main Topics  . Smoking status: Former Smoker -- 4 years    Types: Cigarettes    Quit date: 12/22/1990  . Smokeless tobacco: Not on file   Comment: 1-2 packs a week  . Alcohol Use: No  . Drug Use: No  . Sexually Active: Not on file   Other Topics Concern  . Not on file   Social History Narrative  . No narrative on file    Past Surgical History  Procedure Date  . Right shoulder surgery     x2  . Left heel surgery     x2    Family History  Problem Relation Age of Onset  . Diabetes Father   . Hypertension Father   . Heart attack Maternal Grandfather   . Cancer Maternal Uncle   . Allergies Daughter     Allergies  Allergen Reactions  . Atorvastatin     Current Outpatient Prescriptions on File Prior to Visit  Medication  Sig Dispense Refill  . diclofenac sodium (VOLTAREN) 1 % GEL Apply 1 application topically 4 (four) times daily.      . fish oil-omega-3 fatty acids 1000 MG capsule Take 1 g by mouth 3 (three) times daily.        . fluticasone (FLONASE) 50 MCG/ACT nasal spray 2 sprays to each nostril once daily  16 g  3  . lidocaine (XYLOCAINE) 5 % ointment Apply topically daily.      . metFORMIN (GLUCOPHAGE) 500 MG tablet Take 2 tablets (1,000 mg total) by mouth 2 (two) times daily with a meal.  180 tablet  1  . methocarbamol (ROBAXIN) 750 MG tablet Take 750 mg by mouth 4 (four) times daily.      . traMADol (ULTRAM) 50 MG tablet Take 50 mg by mouth every 8 (eight) hours as needed.        BP 124/76  Pulse 92  Temp 98.9 F (37.2 C) (Oral)  Wt 265 lb (120.203 kg)       Objective:   Physical Exam  Constitutional: He is oriented to person, place, and time. He appears well-developed and well-nourished. No distress.  Cardiovascular: Normal rate, regular rhythm and normal heart sounds.   No  murmur heard. Pulmonary/Chest: Effort normal and breath sounds normal. He has no wheezes.  Musculoskeletal:       Trace lower extremity edema bilaterally  Neurological: He is alert and oriented to person, place, and time. No cranial nerve deficit.  Skin: Skin is warm and dry.  Psychiatric: He has a normal mood and affect. His behavior is normal.          Assessment & Plan:

## 2011-10-12 ENCOUNTER — Encounter: Payer: 59 | Admitting: Physical Medicine & Rehabilitation

## 2011-10-18 ENCOUNTER — Encounter: Payer: 59 | Attending: Physical Medicine & Rehabilitation | Admitting: Physical Medicine & Rehabilitation

## 2011-10-18 DIAGNOSIS — M47817 Spondylosis without myelopathy or radiculopathy, lumbosacral region: Secondary | ICD-10-CM | POA: Insufficient documentation

## 2011-10-18 DIAGNOSIS — M6580 Other synovitis and tenosynovitis, unspecified site: Secondary | ICD-10-CM | POA: Insufficient documentation

## 2011-10-25 ENCOUNTER — Encounter (HOSPITAL_BASED_OUTPATIENT_CLINIC_OR_DEPARTMENT_OTHER): Payer: 59 | Admitting: Physical Medicine & Rehabilitation

## 2011-10-25 ENCOUNTER — Encounter: Payer: Self-pay | Admitting: Physical Medicine & Rehabilitation

## 2011-10-25 VITALS — BP 144/77 | HR 90 | Resp 16 | Ht 70.0 in | Wt 264.0 lb

## 2011-10-25 DIAGNOSIS — M76899 Other specified enthesopathies of unspecified lower limb, excluding foot: Secondary | ICD-10-CM

## 2011-10-25 DIAGNOSIS — M47817 Spondylosis without myelopathy or radiculopathy, lumbosacral region: Secondary | ICD-10-CM

## 2011-10-25 DIAGNOSIS — M6580 Other synovitis and tenosynovitis, unspecified site: Secondary | ICD-10-CM

## 2011-10-25 DIAGNOSIS — M47816 Spondylosis without myelopathy or radiculopathy, lumbar region: Secondary | ICD-10-CM

## 2011-10-25 MED ORDER — HYDROCODONE-ACETAMINOPHEN 5-325 MG PO TABS: 1.0000 | ORAL_TABLET | Freq: Three times a day (TID) | ORAL | Status: DC | PRN

## 2011-10-25 NOTE — Progress Notes (Signed)
Subjective:    Patient ID: [name], male    DOB: 03-27-1970, 41 y.o.   MRN: [id number]  HPI  [name] is back regarding his low back and knee pain. He had an MRI done last week which revealed tendinosis of the right quad tendon with spurring at the patella. Dr. Magnus Ivan has taken him out of knee therapies and ordered braces (he has them today) and an injection was performed.   I reviewed his lumbar imaging from this summer which notes posterior component arthritis. His facets appear a little hazy as well. I also looked at an MRI from 2004 which shows a bulging disc with foraminal stenosis at L5-S1 with some stenosis foraminally.  He uses the relafen as well as the robaxin and tramadol. He spreads on the voltaren gel, but none of thes rx's are really helping at this point. He does like the heat and TENS with PT but the effects are usually rather transient. Stretching seems to help. He has the most pain when he stands for long periods of time. He has difficulty standing when sitting for long periods of time as well.     Pain Inventory Average Pain 9 Pain Right Now 4 My pain is dull  In the last 24 hours, has pain interfered with the following? General activity 2 Relation with others 2 Enjoyment of life 4 What TIME of day is your pain at its worst? All Day Sleep (in general) Fair  Pain is worse with: walking, sitting and standing Pain improves with: heat/ice Relief from Meds: 0  Mobility walk without assistance ability to climb steps?  no do you drive?  yes  Function employed # of hrs/week 40+  Neuro/Psych trouble walking spasms  Prior Studies CT/MRI  Physicians involved in your care Any changes since last visit?  no   Family History  Problem Relation Age of Onset  . Diabetes Father   . Hypertension Father   . Heart attack Maternal Grandfather   . Cancer Maternal Uncle   . Allergies Daughter    History   Social History  . Marital Status: Married      Spouse Name: N/A    Number of Children: 2  . Years of Education: N/A   Occupational History  . Production designer, theatre/television/film and clergy    Social History Main Topics  . Smoking status: Former Smoker -- 4 years    Types: Cigarettes    Quit date: 12/22/1990  . Smokeless tobacco: None   Comment: 1-2 packs a week  . Alcohol Use: No  . Drug Use: No  . Sexually Active: None   Other Topics Concern  . None   Social History Narrative  . None   Past Surgical History  Procedure Date  . Right shoulder surgery     x2  . Left heel surgery     x2   Past Medical History  Diagnosis Date  . Allergy   . Hyperlipidemia   . Gout   . Seizure disorder   . H/O: CVA (cardiovascular accident)   . LBP (low back pain)   . Knee pain, bilateral   . Plantar fasciitis     followed by Dr Harriet Pho   BP 144/77  Pulse 90  Resp 16  Ht 5\' 10"  (1.778 m)  Wt 264 lb (119.75 kg)  BMI 37.88 kg/m2  SpO2 97%      Review of Systems  Constitutional: Negative.   HENT: Negative.   Eyes: Negative.   Respiratory:  Negative.   Cardiovascular: Negative.   Gastrointestinal: Negative.   Genitourinary: Negative.   Musculoskeletal: Positive for back pain and gait problem.  Skin: Negative.   Neurological:       Spasms  Hematological: Negative.   Psychiatric/Behavioral: Negative.        Objective:   Physical Exam  Constitutional: He is oriented to person, place, and time. He appears well-developed and well-nourished.  Moderately obese  HENT:  Head: Normocephalic and atraumatic.  Right Ear: External ear normal.  Left Ear: External ear normal.  Nose: Nose normal.  Mouth/Throat: Oropharynx is clear and moist.  Eyes: Conjunctivae are normal. Pupils are equal, round, and reactive to light.  Neck: Normal range of motion. Neck supple.  Cardiovascular: Normal rate and regular rhythm.  Pulmonary/Chest: Effort normal and breath sounds normal. No respiratory distress.  Abdominal: Soft.  Musculoskeletal:  On  visual examination of the patient, he has elevation of the left shoulder of the right with no protraction of the right scapula. With cueing he does correct to a certain extent but needs tactile cueing to come to neutral. He has fair arm positioning of the spine as I saw no gross curvature. Low back is moderately tender over the lower lumbar segments particularly on the right at L4-L5 and L5-S1 with mild parapsinal spasm. Flexion was achieved at the low back to about 60 with pain. Extension is about 5 or 10 caused pain on the right more than left. Right side bending and twisting cause significant pain. Facet maneuvers were positive on the right more than left. Straight leg testing was equivocal. Pelvic posture was fair.  Patient had pain over both knees particularly on the superior medial aspects seemed to be better. He had an obvious deformity at the right knee more than left. Q- angle appeared to be within normal range. No significant crepitus was appreciated with range of motion. No joint effusions were seen. He did have noticeable pain when moving from a seated to standing position. There mild gross antalgic noted with walking. He did have crepitus in the right knee with extension today. He experienced some distress when moving from a seated to standing position today.  Neurological: He is alert and oriented to person, place, and time. He has normal strength. A sensory deficit is present. No cranial nerve deficit. He displays a negative Romberg sign.  Pain inhibition noted with quadriceps extension bilaterally-  Psychiatric: He has a normal mood and affect. His behavior is normal. Judgment and thought content normal.  Assessment & Plan:   1. Lumbar spondylosis with likely facet arthropathy and disc disease . He does have documented disk disease at L5-S1 on an MRI from 2004. 2. Bilateral quadriceps tendonitis, suprapatellar bursitis. Perhaps early medial compartment arthritis.  3. Moderate obesity.    Plan:  1.  We can consider further intervention such as MBB's at L4-5 and L5-S1 pending progress with PT. (he has only had 3 treatments so far) We discussed the fact that the principles which PT is reviewing with him are the most important in managing his problem over the long term. May consider a follow up MRI if his clinical picture remains murky.  2. Continue relafen 500 q12  3. robaxin 500mg  q6 hours prn  4. Will hold off on therapy for knee per ortho recs. Recommended aggressive ice, voltaren gel to knees. 5. Weight loss continues to be an important factor.  6. Will add hydrocodone for pain control to allow work with therapy.we discussed potentially a  mattress change also. 7. I will see him back in about a month. All questions were encouraged and answered.

## 2011-10-25 NOTE — Patient Instructions (Signed)
UTILIZE ICE AND VOLTAREN GEL FOR YOUR KNEE PAIN  AGGRESSIVE RANGE OF MOTION IS IMPORTANT FOR YOUR BACK PAIN!

## 2011-11-22 ENCOUNTER — Encounter: Payer: 59 | Attending: Physical Medicine & Rehabilitation | Admitting: Physical Medicine & Rehabilitation

## 2011-11-22 DIAGNOSIS — M6580 Other synovitis and tenosynovitis, unspecified site: Secondary | ICD-10-CM | POA: Insufficient documentation

## 2011-11-22 DIAGNOSIS — M47817 Spondylosis without myelopathy or radiculopathy, lumbosacral region: Secondary | ICD-10-CM | POA: Insufficient documentation

## 2011-11-28 ENCOUNTER — Encounter: Payer: Self-pay | Admitting: Physical Medicine & Rehabilitation

## 2011-12-14 ENCOUNTER — Telehealth: Payer: Self-pay | Admitting: Internal Medicine

## 2011-12-14 MED ORDER — METFORMIN HCL 500 MG PO TABS: 1000.0000 mg | ORAL_TABLET | Freq: Two times a day (BID) | ORAL | Status: DC

## 2011-12-14 NOTE — Telephone Encounter (Signed)
rx sent electronically. 

## 2011-12-14 NOTE — Telephone Encounter (Signed)
Refill- metformin hcl 500mg  tablet. Take two tablets by mouth twice a day with a meal. Qty 180 last fill 9.30.13

## 2011-12-27 ENCOUNTER — Encounter: Payer: Self-pay | Admitting: Internal Medicine

## 2011-12-27 ENCOUNTER — Ambulatory Visit (INDEPENDENT_AMBULATORY_CARE_PROVIDER_SITE_OTHER): Payer: 59 | Admitting: Internal Medicine

## 2011-12-27 ENCOUNTER — Ambulatory Visit: Payer: 59 | Admitting: Internal Medicine

## 2011-12-27 VITALS — BP 126/84 | HR 112 | Temp 99.0°F | Wt 263.0 lb

## 2011-12-27 DIAGNOSIS — M2550 Pain in unspecified joint: Secondary | ICD-10-CM

## 2011-12-27 DIAGNOSIS — E119 Type 2 diabetes mellitus without complications: Secondary | ICD-10-CM

## 2011-12-27 LAB — BASIC METABOLIC PANEL
Calcium: 9.5 mg/dL (ref 8.4–10.5)
GFR: 108.87 mL/min (ref >60.00)
Glucose, Bld: 75 mg/dL (ref 70–99)
Sodium: 138 mEq/L (ref 135–145)

## 2011-12-27 MED ORDER — DULOXETINE HCL 30 MG PO CPEP: 30.0000 mg | ORAL_CAPSULE | Freq: Every day | ORAL | Status: DC

## 2011-12-27 MED ORDER — HYDROCODONE-ACETAMINOPHEN 5-500 MG PO TABS: 1.0000 | ORAL_TABLET | Freq: Three times a day (TID) | ORAL | Status: DC | PRN

## 2011-12-27 NOTE — Assessment & Plan Note (Signed)
Patient has chronic knee pain, back pain, and other muscle skeletal complaints. He may benefit from Cymbalta. Start at 30 mg once daily for one month and then titrate to 60 mg.  We discussed potential side effects.

## 2011-12-27 NOTE — Progress Notes (Signed)
Subjective:    Patient ID: [name], male    DOB: 1970/03/27, 41 y.o.   MRN: [id number]  HPI  41 year old African American male with a borderline type 2 diabetes, chronic low back pain and chronic knee pain for follow up. Patient struggling with chronic knee pain despite conservative treatment. He is using diclofenac gel, muscle relaxers and tramadol 3 times a day. He is frustrated with lack of mobility. Patient reports decrease in mood due to chronic pain.  Review of Systems Negative for chest pain  Past Medical History  Diagnosis Date  . Allergy   . Hyperlipidemia   . Gout   . Seizure disorder   . H/O: CVA (cardiovascular accident)   . LBP (low back pain)   . Knee pain, bilateral   . Plantar fasciitis     followed by Dr Harriet Pho    History   Social History  . Marital Status: Married    Spouse Name: N/A    Number of Children: 2  . Years of Education: N/A   Occupational History  . Production designer, theatre/television/film and clergy    Social History Main Topics  . Smoking status: Former Smoker -- 4 years    Types: Cigarettes    Quit date: 12/22/1990  . Smokeless tobacco: Not on file     Comment: 1-2 packs a week  . Alcohol Use: No  . Drug Use: No  . Sexually Active: Not on file   Other Topics Concern  . Not on file   Social History Narrative  . No narrative on file    Past Surgical History  Procedure Date  . Right shoulder surgery     x2  . Left heel surgery     x2    Family History  Problem Relation Age of Onset  . Diabetes Father   . Hypertension Father   . Heart attack Maternal Grandfather   . Cancer Maternal Uncle   . Allergies Daughter     Allergies  Allergen Reactions  . Atorvastatin     Current Outpatient Prescriptions on File Prior to Visit  Medication Sig Dispense Refill  . diclofenac sodium (VOLTAREN) 1 % GEL Apply 1 application topically 4 (four) times daily.      . fish oil-omega-3 fatty acids 1000 MG capsule Take 1 g by mouth 3 (three) times  daily.        . fluticasone (FLONASE) 50 MCG/ACT nasal spray 2 sprays to each nostril once daily  16 g  3  . lidocaine (XYLOCAINE) 5 % ointment Apply topically daily.      . metFORMIN (GLUCOPHAGE) 500 MG tablet Take 2 tablets (1,000 mg total) by mouth 2 (two) times daily with a meal.  180 tablet  1  . DULoxetine (CYMBALTA) 30 MG capsule Take 1 capsule (30 mg total) by mouth daily.  30 capsule  0    BP 126/84  Pulse 112  Temp 99 F (37.2 C) (Oral)  Wt 263 lb (119.296 kg)       Objective:   Physical Exam  Constitutional: He is oriented to person, place, and time. He appears well-developed and well-nourished.  Cardiovascular: Normal rate, regular rhythm and normal heart sounds.   Pulmonary/Chest: Effort normal and breath sounds normal. He has no wheezes.  Musculoskeletal:       Trace lower ext edema Bilateral knee braces  Neurological: He is alert and oriented to person, place, and time. No cranial nerve deficit.  Skin: Skin is warm and dry.  Psychiatric: He has a normal mood and affect. His behavior is normal.          Assessment & Plan:

## 2011-12-27 NOTE — Assessment & Plan Note (Signed)
Continue metformin. Monitor A1c.

## 2012-01-05 ENCOUNTER — Telehealth: Payer: Self-pay | Admitting: Internal Medicine

## 2012-01-05 MED ORDER — DULOXETINE HCL 30 MG PO CPEP: 30.0000 mg | ORAL_CAPSULE | Freq: Every day | ORAL | Status: DC

## 2012-01-05 NOTE — Telephone Encounter (Signed)
rx sent in electronically, pt aware 

## 2012-01-05 NOTE — Telephone Encounter (Signed)
Patient called stating that he was in the office on 11.26.13 and the MD was supposed to call in his cymbalta and this was not at his pharmacy. Please resend and inform patient when done.

## 2012-01-13 ENCOUNTER — Ambulatory Visit (INDEPENDENT_AMBULATORY_CARE_PROVIDER_SITE_OTHER): Payer: 59 | Admitting: Pulmonary Disease

## 2012-01-13 ENCOUNTER — Encounter: Payer: Self-pay | Admitting: Pulmonary Disease

## 2012-01-13 VITALS — BP 126/98 | HR 83 | Temp 98.5°F | Ht 70.0 in | Wt 263.0 lb

## 2012-01-13 DIAGNOSIS — G4733 Obstructive sleep apnea (adult) (pediatric): Secondary | ICD-10-CM

## 2012-01-13 NOTE — Assessment & Plan Note (Signed)
The patient is clinically doing well with CPAP, but continues to have issues with his very aged CPAP machine.  He continues to have issues with the on/off button, and is overdue for a new device.  We'll send an order to his medical equipment company and try to get this through insurance.  I have asked him to continue with CPAP, and to work aggressively on weight loss.

## 2012-01-13 NOTE — Patient Instructions (Addendum)
Will try to get you a new machine since yours is aged and having issues. Keep working on weight loss. followup with me in one year if doing well.

## 2012-01-13 NOTE — Progress Notes (Signed)
  Subjective:    Patient ID: [name], male    DOB: November 13, 1970, 41 y.o.   MRN: [id number]  HPI Patient comes in today for followup of his known obstructive sleep apnea.  He is wearing CPAP compliantly, and feels that he sleeps fairly well with the device.  He is having no mass or pressure issues, but is having issues with his CPAP machine.  His current device is over 30 years old, and he is having issues with the buttons.  Of note, the patient has lost 9 pounds since his last visit here.   Review of Systems  Constitutional: Negative for fever and unexpected weight change.  HENT: Negative for ear pain, nosebleeds, congestion, sore throat, rhinorrhea, sneezing, trouble swallowing, dental problem, postnasal drip and sinus pressure.        Head congestion, runny nose PNDX 1 month ago.   Eyes: Negative for redness and itching.  Respiratory: Negative for cough, chest tightness, shortness of breath and wheezing.   Cardiovascular: Positive for leg swelling. Negative for palpitations.  Gastrointestinal: Negative for nausea and vomiting.  Genitourinary: Positive for urgency ( urgerncy to go but the flow is not there). Negative for dysuria.  Musculoskeletal: Negative for joint swelling.  Skin: Negative for rash.  Neurological: Negative for headaches.  Hematological: Does not bruise/bleed easily.  Psychiatric/Behavioral: Negative for dysphoric mood. The patient is not nervous/anxious.        Objective:   Physical Exam Overweight male in no acute distress Nose without purulence or discharge noted No skin breakdown or pressure necrosis from the CPAP mask Lower extremities without edema, no cyanosis Alert calm and does not appear to be sleepy, moves all 4 extremities.       Assessment & Plan:

## 2012-01-19 ENCOUNTER — Telehealth: Payer: Self-pay | Admitting: Internal Medicine

## 2012-01-19 NOTE — Telephone Encounter (Signed)
Patient Information:  Caller Name: [name]  Phone: [phone]  Patient: [name], [name]  Gender: Male  DOB: 07/02/1970  Age: 41 Years  PCP: [name]) (Adults only)  Office Follow Up:  Does the office need to follow up with this patient?: Yes  Instructions For The Office: Can he stop the Cymbalta?  Is there another medication for his knee pain?  RN Note:  Has not taken the Cymbalta today.  He was taking same for knee pain and it did not help the pain.  Symptoms  Reason For Call & Symptoms: Started Cymbalta 3 weeks ago, having difficulty starting urine stream and has diarrhea.  Also has no sex drive.  Reviewed Health History In EMR: Yes  Reviewed Medications In EMR: Yes  Reviewed Allergies In EMR: Yes  Reviewed Surgeries / Procedures: Yes  Date of Onset of Symptoms: 01/05/2012  Guideline(s) Used:  No Protocol Available - Information Only  Disposition Per Guideline:   Discuss with PCP and Callback by Nurse Today  Reason For Disposition Reached:   Nursing judgment  Advice Given:  N/A

## 2012-01-20 NOTE — Telephone Encounter (Signed)
Pt aware, cymbalta removed from med list

## 2012-01-20 NOTE — Telephone Encounter (Signed)
Left message for pt to call back  °

## 2012-01-20 NOTE — Telephone Encounter (Signed)
Ok to stop cymbalta.  Continue taking pain medications as prescribed by his ortho physicians

## 2012-01-30 ENCOUNTER — Telehealth: Payer: Self-pay | Admitting: Internal Medicine

## 2012-01-30 ENCOUNTER — Other Ambulatory Visit: Payer: Self-pay | Admitting: *Deleted

## 2012-01-30 MED ORDER — PRAVASTATIN SODIUM 40 MG PO TABS: 40.0000 mg | ORAL_TABLET | Freq: Every day | ORAL | Status: DC

## 2012-01-30 NOTE — Telephone Encounter (Signed)
Done

## 2012-01-30 NOTE — Telephone Encounter (Signed)
Refill-pravastatin sodium 40mg  tab. Take one tablet by mouth every day. Qty 90 last fill 9.30.13

## 2012-02-07 ENCOUNTER — Encounter: Payer: Self-pay | Admitting: Internal Medicine

## 2012-02-07 ENCOUNTER — Ambulatory Visit (INDEPENDENT_AMBULATORY_CARE_PROVIDER_SITE_OTHER): Payer: BC Managed Care – PPO | Admitting: Internal Medicine

## 2012-02-07 VITALS — BP 114/76 | HR 98 | Temp 98.0°F | Wt 262.0 lb

## 2012-02-07 DIAGNOSIS — M25521 Pain in right elbow: Secondary | ICD-10-CM | POA: Insufficient documentation

## 2012-02-07 DIAGNOSIS — M25529 Pain in unspecified elbow: Secondary | ICD-10-CM

## 2012-02-07 DIAGNOSIS — M2550 Pain in unspecified joint: Secondary | ICD-10-CM

## 2012-02-07 MED ORDER — PREGABALIN 50 MG PO CAPS: 50.0000 mg | ORAL_CAPSULE | Freq: Two times a day (BID) | ORAL | Status: DC

## 2012-02-07 MED ORDER — PRAVASTATIN SODIUM 40 MG PO TABS: 40.0000 mg | ORAL_TABLET | Freq: Every day | ORAL | Status: DC

## 2012-02-07 NOTE — Assessment & Plan Note (Signed)
Patient could not tolerate Cymbalta secondary to stomach upset, difficulty urinating and decreased libido. Trial of Lyrica. Start 50 mg once daily for 3-5 days and titrate to 50 mg twice daily.

## 2012-02-07 NOTE — Assessment & Plan Note (Signed)
Patient has complaints of right elbow pain consistent with right epicondylitis. I suggested rest and use of diclofenac gel 3 times a day.

## 2012-02-07 NOTE — Patient Instructions (Addendum)
Schedule next visit as CPX - 30 minute Please complete the following lab tests before your next follow up appointment: BMET, A1c - 250.00 FLP, LFTs - 272.4 CBCD - 250.00

## 2012-02-07 NOTE — Progress Notes (Signed)
Subjective:    Patient ID: [name], male    DOB: 1970-08-29, 42 y.o.   MRN: [id number]  HPI  42 year old African American male with history of chronic low back pain, and knee pain for followup. Patient tried on Cymbalta at previous visit considering multiple musculoskeletal complaints (possible fibromyalgia).  Patient could not tolerate the medication secondary to stomach upset, decreased libido and difficulty urinating.  Patient followed by orthopedic specialist and pain management. He is currently taking hydrocodone/APAP twice daily. He denies any constipation.  Patient also complains of right elbow pain. Symptoms started several weeks ago. He has been working around the house. Pain localized to lateral aspect of right elbow.  Review of Systems See HPI  Past Medical History  Diagnosis Date  . Allergy   . Hyperlipidemia   . Gout   . Seizure disorder   . H/O: CVA (cardiovascular accident)   . LBP (low back pain)   . Knee pain, bilateral   . Plantar fasciitis     followed by Dr Harriet Pho    History   Social History  . Marital Status: Married    Spouse Name: N/A    Number of Children: 2  . Years of Education: N/A   Occupational History  . Production designer, theatre/television/film and clergy    Social History Main Topics  . Smoking status: Former Smoker -- 4 years    Types: Cigarettes    Quit date: 12/22/1990  . Smokeless tobacco: Not on file     Comment: 1-2 packs a week  . Alcohol Use: No  . Drug Use: No  . Sexually Active: Not on file   Other Topics Concern  . Not on file   Social History Narrative  . No narrative on file    Past Surgical History  Procedure Date  . Right shoulder surgery     x2  . Left heel surgery     x2    Family History  Problem Relation Age of Onset  . Diabetes Father   . Hypertension Father   . Heart attack Maternal Grandfather   . Cancer Maternal Uncle   . Allergies Daughter     Allergies  Allergen Reactions  . Atorvastatin     Current  Outpatient Prescriptions on File Prior to Visit  Medication Sig Dispense Refill  . cyclobenzaprine (FLEXERIL) 10 MG tablet Take 10 mg by mouth as needed.      . diclofenac sodium (VOLTAREN) 1 % GEL Apply 1 application topically 4 (four) times daily.      . fish oil-omega-3 fatty acids 1000 MG capsule Take 1 g by mouth 3 (three) times daily.        Marland Kitchen HYDROcodone-acetaminophen (VICODIN) 5-500 MG per tablet Take 1 tablet by mouth every 8 (eight) hours as needed for pain.  60 tablet  3  . lidocaine (XYLOCAINE) 5 % ointment Apply topically daily.      . metFORMIN (GLUCOPHAGE) 500 MG tablet Take 2 tablets (1,000 mg total) by mouth 2 (two) times daily with a meal.  180 tablet  1  . pregabalin (LYRICA) 50 MG capsule Take 1 capsule (50 mg total) by mouth 2 (two) times daily.  60 capsule  1    BP 114/76  Pulse 98  Temp 98 F (36.7 C) (Oral)  Wt 262 lb (118.842 kg)       Objective:   Physical Exam  Constitutional: He is oriented to person, place, and time. He appears well-developed and well-nourished.  Cardiovascular: Normal  rate, regular rhythm and normal heart sounds.   Pulmonary/Chest: Effort normal and breath sounds normal. He has no wheezes.  Musculoskeletal:       Tenderness of right lateral epicondyle  Neurological: He is alert and oriented to person, place, and time.  Psychiatric: He has a normal mood and affect. His behavior is normal.          Assessment & Plan:

## 2012-03-30 ENCOUNTER — Other Ambulatory Visit (INDEPENDENT_AMBULATORY_CARE_PROVIDER_SITE_OTHER): Payer: BC Managed Care – PPO

## 2012-03-30 DIAGNOSIS — E1059 Type 1 diabetes mellitus with other circulatory complications: Secondary | ICD-10-CM

## 2012-03-30 DIAGNOSIS — E785 Hyperlipidemia, unspecified: Secondary | ICD-10-CM

## 2012-03-30 LAB — LIPID PANEL
Cholesterol: 178 mg/dL (ref 0–200)
HDL: 39.5 mg/dL (ref >39.00)
LDL Cholesterol: 123 mg/dL — ABNORMAL HIGH (ref 0–99)
Total CHOL/HDL Ratio: 5
Triglycerides: 78 mg/dL (ref 0.0–149.0)
VLDL: 15.6 mg/dL (ref 0.0–40.0)

## 2012-03-30 LAB — HEPATIC FUNCTION PANEL
ALT: 11 U/L (ref 0–53)
Bilirubin, Direct: 0.1 mg/dL (ref 0.0–0.3)
Total Bilirubin: 0.4 mg/dL (ref 0.3–1.2)

## 2012-03-30 LAB — CBC WITH DIFFERENTIAL/PLATELET
Basophils Absolute: 0 10*3/uL (ref 0.0–0.1)
Eosinophils Absolute: 0.1 10*3/uL (ref 0.0–0.7)
Hemoglobin: 13.9 g/dL (ref 13.0–17.0)
Lymphocytes Relative: 39.9 % (ref 12.0–46.0)
MCHC: 32.4 g/dL (ref 30.0–36.0)
Monocytes Relative: 7.1 % (ref 3.0–12.0)
Neutro Abs: 2.9 10*3/uL (ref 1.4–7.7)
Neutrophils Relative %: 50.9 % (ref 43.0–77.0)
Platelets: 249 10*3/uL (ref 150.0–400.0)
RDW: 15 % — ABNORMAL HIGH (ref 11.5–14.6)

## 2012-03-30 LAB — BASIC METABOLIC PANEL
CO2: 29 mEq/L (ref 19–32)
Calcium: 8.9 mg/dL (ref 8.4–10.5)
Creatinine, Ser: 1 mg/dL (ref 0.4–1.5)
Sodium: 140 mEq/L (ref 135–145)

## 2012-03-30 LAB — HEMOGLOBIN A1C: Hgb A1c MFr Bld: 6.3 % (ref 4.6–6.5)

## 2012-04-06 ENCOUNTER — Ambulatory Visit: Payer: BC Managed Care – PPO | Admitting: Internal Medicine

## 2012-04-13 ENCOUNTER — Ambulatory Visit (INDEPENDENT_AMBULATORY_CARE_PROVIDER_SITE_OTHER): Payer: BC Managed Care – PPO | Admitting: Internal Medicine

## 2012-04-13 ENCOUNTER — Encounter: Payer: Self-pay | Admitting: Internal Medicine

## 2012-04-13 VITALS — BP 122/94 | HR 88 | Temp 98.1°F | Wt 255.0 lb

## 2012-04-13 DIAGNOSIS — D509 Iron deficiency anemia, unspecified: Secondary | ICD-10-CM

## 2012-04-13 DIAGNOSIS — E119 Type 2 diabetes mellitus without complications: Secondary | ICD-10-CM

## 2012-04-13 DIAGNOSIS — E785 Hyperlipidemia, unspecified: Secondary | ICD-10-CM

## 2012-04-13 MED ORDER — PREGABALIN 100 MG PO CAPS: 100.0000 mg | ORAL_CAPSULE | Freq: Two times a day (BID) | ORAL | Status: DC

## 2012-04-13 MED ORDER — METFORMIN HCL 500 MG PO TABS: 1000.0000 mg | ORAL_TABLET | Freq: Two times a day (BID) | ORAL | Status: DC

## 2012-04-13 MED ORDER — HYDROCODONE-ACETAMINOPHEN 5-500 MG PO TABS: 1.0000 | ORAL_TABLET | Freq: Three times a day (TID) | ORAL | Status: DC | PRN

## 2012-04-13 MED ORDER — PRAVASTATIN SODIUM 40 MG PO TABS: 40.0000 mg | ORAL_TABLET | Freq: Every day | ORAL | Status: DC

## 2012-04-13 NOTE — Progress Notes (Signed)
  Subjective:    Patient ID: [name], male    DOB: 1970-06-25, 42 y.o.   MRN: [id number]  HPI  42 year old African American male for followup regarding possible fibromyalgia, borderline type 2 diabetes and hyperlipidemia.  Patient previously started on Lyrica 50 mg twice daily. Patient has not noticed any significant improvement in his fibromyalgia symptoms. He is not having any somnolence issues with current dose.  Borderline type 2 diabetes-his A1c is stable. He is tolerating metformin without difficulty. He is following low-carb diet.  Review of Systems  negative for chest pain  Past Medical History  Diagnosis Date  . Allergy   . Hyperlipidemia   . Gout   . Seizure disorder   . H/O: CVA (cardiovascular accident)   . LBP (low back pain)   . Knee pain, bilateral   . Plantar fasciitis     followed by Dr Harriet Pho    History   Social History  . Marital Status: Married    Spouse Name: N/A    Number of Children: 2  . Years of Education: N/A   Occupational History  . Production designer, theatre/television/film and clergy    Social History Main Topics  . Smoking status: Former Smoker -- 4 years    Types: Cigarettes    Quit date: 12/22/1990  . Smokeless tobacco: Not on file     Comment: 1-2 packs a week  . Alcohol Use: No  . Drug Use: No  . Sexually Active: Not on file   Other Topics Concern  . Not on file   Social History Narrative  . No narrative on file    Past Surgical History  Procedure Laterality Date  . Right shoulder surgery      x2  . Left heel surgery      x2    Family History  Problem Relation Age of Onset  . Diabetes Father   . Hypertension Father   . Heart attack Maternal Grandfather   . Cancer Maternal Uncle   . Allergies Daughter     Allergies  Allergen Reactions  . Atorvastatin   . Cymbalta (Duloxetine Hcl)     Stomach upset, difficulty urinating, decreased libido    Current Outpatient Prescriptions on File Prior to Visit  Medication Sig Dispense  Refill  . cyclobenzaprine (FLEXERIL) 10 MG tablet Take 10 mg by mouth as needed.      . diclofenac sodium (VOLTAREN) 1 % GEL Apply 1 application topically 4 (four) times daily.      . fish oil-omega-3 fatty acids 1000 MG capsule Take 1 g by mouth 3 (three) times daily.        Marland Kitchen lidocaine (XYLOCAINE) 5 % ointment Apply topically daily.       No current facility-administered medications on file prior to visit.    BP 122/94  Pulse 88  Temp(Src) 98.1 F (36.7 C) (Oral)  Wt 255 lb (115.667 kg)  BMI 36.59 kg/m2       Objective:   Physical Exam  Constitutional: He is oriented to person, place, and time. He appears well-developed and well-nourished.  Cardiovascular: Normal rate, regular rhythm and normal heart sounds.   Pulmonary/Chest: Effort normal and breath sounds normal. He has no wheezes.  Neurological: He is alert and oriented to person, place, and time. No cranial nerve deficit.  Psychiatric: He has a normal mood and affect. His behavior is normal.          Assessment & Plan:

## 2012-04-13 NOTE — Assessment & Plan Note (Addendum)
A1c trending lower.  Continue metformin and carb modified diet.   Lab Results  Component Value Date   HGBA1C 6.3 03/30/2012

## 2012-04-13 NOTE — Assessment & Plan Note (Signed)
Stable.  Continue pravastatin.   Lab Results  Component Value Date   CHOL 178 03/30/2012   HDL 39.50 03/30/2012   LDLCALC 123* 03/30/2012   LDLDIRECT 148.1 11/04/2010   TRIG 78.0 03/30/2012   CHOLHDL 5 03/30/2012   Lab Results  Component Value Date   ALT 11 03/30/2012   AST 15 03/30/2012   ALKPHOS 54 03/30/2012   BILITOT 0.4 03/30/2012

## 2012-04-16 ENCOUNTER — Telehealth: Payer: Self-pay | Admitting: Internal Medicine

## 2012-04-16 DIAGNOSIS — D649 Anemia, unspecified: Secondary | ICD-10-CM

## 2012-04-16 NOTE — Telephone Encounter (Signed)
Patient Information:  Caller Name: Daquann  Phone: [phone]  Patient: [name], [name]  Gender: Male  DOB: April 12, 1970  Age: 42 Years  PCP: Artist Pais Doe-Hyun Molly Maduro) (Adults only)  Office Follow Up:  Does the office need to follow up with this patient?: Yes/ message to PCP   Symptoms  Reason For Call & Symptoms: Calling to let Dr. Artist Pais know that he donated a double portion of blood Around 02/25/12. He is going to submit stool sample on 04/17/12 but thought that maybe his Hgb on 03/30/12 is low from having donated.  Reviewed Health History In EMR: Yes  Reviewed Medications In EMR: Yes  Reviewed Allergies In EMR: Yes  Reviewed Surgeries / Procedures: Yes  Date of Onset of Symptoms: 04/16/2012  Guideline(s) Used:  No Protocol Available - Information Only  Disposition Per Guideline:   Home Care  Reason For Disposition Reached:   Information only question and nurse able to answer  Patient Will Follow Care Advice:  YES

## 2012-04-17 NOTE — Telephone Encounter (Signed)
Yes, it likely contributed to drop in his Hg.  I would still perform iFOB and repeat CBCD in 2 months. 285.9

## 2012-04-19 NOTE — Telephone Encounter (Signed)
Left message on machine for patient to return our call 

## 2012-04-20 ENCOUNTER — Other Ambulatory Visit: Payer: BC Managed Care – PPO

## 2012-04-20 ENCOUNTER — Telehealth: Payer: Self-pay | Admitting: Internal Medicine

## 2012-04-20 DIAGNOSIS — D649 Anemia, unspecified: Secondary | ICD-10-CM

## 2012-04-20 LAB — FECAL OCCULT BLOOD, IMMUNOCHEMICAL: Fecal Occult Bld: NEGATIVE

## 2012-04-20 NOTE — Telephone Encounter (Signed)
Patient is aware 

## 2012-04-20 NOTE — Telephone Encounter (Signed)
She stated that you put in the order under [facility], and it needs to be under [facility]! Please assist.

## 2012-06-26 ENCOUNTER — Encounter: Payer: Self-pay | Admitting: Internal Medicine

## 2012-06-26 ENCOUNTER — Ambulatory Visit (INDEPENDENT_AMBULATORY_CARE_PROVIDER_SITE_OTHER): Payer: BC Managed Care – PPO | Admitting: Internal Medicine

## 2012-06-26 VITALS — BP 122/84 | Temp 98.5°F | Wt 272.0 lb

## 2012-06-26 DIAGNOSIS — J329 Chronic sinusitis, unspecified: Secondary | ICD-10-CM

## 2012-06-26 DIAGNOSIS — M2550 Pain in unspecified joint: Secondary | ICD-10-CM

## 2012-06-26 MED ORDER — CEFUROXIME AXETIL 500 MG PO TABS: 500.0000 mg | ORAL_TABLET | Freq: Two times a day (BID) | ORAL | Status: AC

## 2012-06-26 NOTE — Assessment & Plan Note (Signed)
42 year old [place] American male with probable right-sided sinusitis.  Treat with cefuroxime 500 mg twice daily for 10 days. Use intranasal saline as directed.  Patient advised to call office if symptoms persist or worsen.

## 2012-06-26 NOTE — Patient Instructions (Addendum)
Use intranasal saline as directed. Please contact our office if your symptoms do not improve or gets worse. Taper off Lyrica as directed

## 2012-06-26 NOTE — Assessment & Plan Note (Signed)
Patient has not noticed any significant improvement with higher dose of Lyrica. Patient advised to taper off over the next 2-4 weeks.

## 2012-06-26 NOTE — Progress Notes (Signed)
Subjective:    Patient ID: [name], male    DOB: 1970-08-04, 42 y.o.   MRN: [id number]  HPI  42 year old African American male complains of intermittent right-sided sinus pain for the last 2-3 weeks. He also complains that his right upper teeth are uncomfortable and he has right-sided earache. He had sinus infection in the past with similar symptoms. He used over-the-counter sinus medication without relief.  Review of Systems Negative for fever or chills    Past Medical History  Diagnosis Date  . Allergy   . Hyperlipidemia   . Gout   . Seizure disorder   . H/O: CVA (cardiovascular accident)   . LBP (low back pain)   . Knee pain, bilateral   . Plantar fasciitis     followed by Dr Harriet Pho    History   Social History  . Marital Status: Married    Spouse Name: N/A    Number of Children: 2  . Years of Education: N/A   Occupational History  . Production designer, theatre/television/film and clergy    Social History Main Topics  . Smoking status: Former Smoker -- 4 years    Types: Cigarettes    Quit date: 12/22/1990  . Smokeless tobacco: Not on file     Comment: 1-2 packs a week  . Alcohol Use: No  . Drug Use: No  . Sexually Active: Not on file   Other Topics Concern  . Not on file   Social History Narrative  . No narrative on file    Past Surgical History  Procedure Laterality Date  . Right shoulder surgery      x2  . Left heel surgery      x2    Family History  Problem Relation Age of Onset  . Diabetes Father   . Hypertension Father   . Heart attack Maternal Grandfather   . Cancer Maternal Uncle   . Allergies Daughter     Allergies  Allergen Reactions  . Atorvastatin   . Cymbalta (Duloxetine Hcl)     Stomach upset, difficulty urinating, decreased libido    Current Outpatient Prescriptions on File Prior to Visit  Medication Sig Dispense Refill  . cyclobenzaprine (FLEXERIL) 10 MG tablet Take 10 mg by mouth as needed.      . diclofenac sodium (VOLTAREN) 1 % GEL Apply  1 application topically 4 (four) times daily.      . fish oil-omega-3 fatty acids 1000 MG capsule Take 1 g by mouth 3 (three) times daily.        Marland Kitchen HYDROcodone-acetaminophen (VICODIN) 5-500 MG per tablet Take 1 tablet by mouth every 8 (eight) hours as needed for pain.  60 tablet  3  . lidocaine (XYLOCAINE) 5 % ointment Apply topically daily.      . metFORMIN (GLUCOPHAGE) 500 MG tablet Take 2 tablets (1,000 mg total) by mouth 2 (two) times daily with a meal.  180 tablet  1  . pravastatin (PRAVACHOL) 40 MG tablet Take 1 tablet (40 mg total) by mouth daily.  90 tablet  1   No current facility-administered medications on file prior to visit.    BP 122/84  Temp(Src) 98.5 F (36.9 C) (Oral)  Wt 272 lb (123.378 kg)  BMI 39.03 kg/m2    Objective:   Physical Exam  Constitutional: He is oriented to person, place, and time. He appears well-developed and well-nourished.  HENT:  Head: Normocephalic and atraumatic.  Right tympanic membrane retracted and slightly erythematous  Neck: Neck supple.  Mild right-sided neck tenderness  Cardiovascular: Normal rate, regular rhythm and normal heart sounds.   Pulmonary/Chest: Effort normal and breath sounds normal. He has no wheezes.  Neurological: He is alert and oriented to person, place, and time. No cranial nerve deficit.  Psychiatric: He has a normal mood and affect. His behavior is normal.          Assessment & Plan:

## 2012-07-09 ENCOUNTER — Other Ambulatory Visit (INDEPENDENT_AMBULATORY_CARE_PROVIDER_SITE_OTHER): Payer: BC Managed Care – PPO

## 2012-07-09 DIAGNOSIS — D649 Anemia, unspecified: Secondary | ICD-10-CM

## 2012-07-09 DIAGNOSIS — E119 Type 2 diabetes mellitus without complications: Secondary | ICD-10-CM

## 2012-07-09 LAB — CBC WITH DIFFERENTIAL/PLATELET
Basophils Relative: 0.3 % (ref 0.0–3.0)
Eosinophils Absolute: 0.1 10*3/uL (ref 0.0–0.7)
Eosinophils Relative: 1.7 % (ref 0.0–5.0)
Hemoglobin: 14.6 g/dL (ref 13.0–17.0)
Lymphocytes Relative: 37.5 % (ref 12.0–46.0)
MCHC: 32.4 g/dL (ref 30.0–36.0)
Monocytes Relative: 6.4 % (ref 3.0–12.0)
Neutro Abs: 3.3 10*3/uL (ref 1.4–7.7)
Neutrophils Relative %: 54.1 % (ref 43.0–77.0)
RBC: 5.75 Mil/uL (ref 4.22–5.81)
WBC: 6.1 10*3/uL (ref 4.5–10.5)

## 2012-07-09 LAB — BASIC METABOLIC PANEL
BUN: 11 mg/dL (ref 6–23)
CO2: 27 mEq/L (ref 19–32)
Chloride: 105 mEq/L (ref 96–112)
Creatinine, Ser: 1 mg/dL (ref 0.4–1.5)
Glucose, Bld: 83 mg/dL (ref 70–99)
Potassium: 3.7 mEq/L (ref 3.5–5.1)

## 2012-07-16 ENCOUNTER — Ambulatory Visit: Payer: BC Managed Care – PPO | Admitting: Internal Medicine

## 2012-07-26 ENCOUNTER — Ambulatory Visit (INDEPENDENT_AMBULATORY_CARE_PROVIDER_SITE_OTHER): Payer: BC Managed Care – PPO | Admitting: Internal Medicine

## 2012-07-26 ENCOUNTER — Encounter: Payer: Self-pay | Admitting: Internal Medicine

## 2012-07-26 VITALS — BP 118/84 | HR 88 | Temp 98.0°F | Wt 271.0 lb

## 2012-07-26 DIAGNOSIS — E785 Hyperlipidemia, unspecified: Secondary | ICD-10-CM

## 2012-07-26 DIAGNOSIS — M47816 Spondylosis without myelopathy or radiculopathy, lumbar region: Secondary | ICD-10-CM

## 2012-07-26 DIAGNOSIS — M47817 Spondylosis without myelopathy or radiculopathy, lumbosacral region: Secondary | ICD-10-CM

## 2012-07-26 DIAGNOSIS — E119 Type 2 diabetes mellitus without complications: Secondary | ICD-10-CM

## 2012-07-26 MED ORDER — CYCLOBENZAPRINE HCL 10 MG PO TABS: 10.0000 mg | ORAL_TABLET | ORAL | Status: DC | PRN

## 2012-07-26 MED ORDER — METFORMIN HCL 500 MG PO TABS: 1000.0000 mg | ORAL_TABLET | Freq: Two times a day (BID) | ORAL | Status: DC

## 2012-07-26 MED ORDER — PRAVASTATIN SODIUM 40 MG PO TABS: 40.0000 mg | ORAL_TABLET | Freq: Every day | ORAL | Status: DC

## 2012-07-26 MED ORDER — FLUTICASONE PROPIONATE 50 MCG/ACT NA SUSP: 2.0000 | Freq: Every day | NASAL | Status: DC

## 2012-07-26 NOTE — Patient Instructions (Addendum)
Please complete the following lab tests before your next follow up appointment: BMET, A1c - 250.00 FLP, LFTs, CPK - 272.4

## 2012-07-26 NOTE — Assessment & Plan Note (Signed)
Patient tolerating pravastatin. Monitor FLP and LFTs.

## 2012-07-26 NOTE — Progress Notes (Signed)
  Subjective:    Patient ID: [name], male    DOB: December 25, 1970, 42 y.o.   MRN: [id number]  HPI  42 year old [place] American male for followup regarding type 2 diabetes. Patient reports he has been having more issues with low back pain and has not been able to exercise on a regular basis. His A1c worse at 6.8. He continues to take metformin 1 g twice daily.  He is tolerating pravastatin without any side effects.   Review of Systems Negative for chest pain,  Negative for significant weight change  Past Medical History  Diagnosis Date  . Allergy   . Hyperlipidemia   . Gout   . Seizure disorder   . H/O: CVA (cardiovascular accident)   . LBP (low back pain)   . Knee pain, bilateral   . Plantar fasciitis     followed by Dr Harriet Pho    History   Social History  . Marital Status: Married    Spouse Name: N/A    Number of Children: 2  . Years of Education: N/A   Occupational History  . Production designer, theatre/television/film and clergy    Social History Main Topics  . Smoking status: Former Smoker -- 4 years    Types: Cigarettes    Quit date: 12/22/1990  . Smokeless tobacco: Not on file     Comment: 1-2 packs a week  . Alcohol Use: No  . Drug Use: No  . Sexually Active: Not on file   Other Topics Concern  . Not on file   Social History Narrative  . No narrative on file    Past Surgical History  Procedure Laterality Date  . Right shoulder surgery      x2  . Left heel surgery      x2    Family History  Problem Relation Age of Onset  . Diabetes Father   . Hypertension Father   . Heart attack Maternal Grandfather   . Cancer Maternal Uncle   . Allergies Daughter     Allergies  Allergen Reactions  . Atorvastatin   . Cymbalta (Duloxetine Hcl)     Stomach upset, difficulty urinating, decreased libido    Current Outpatient Prescriptions on File Prior to Visit  Medication Sig Dispense Refill  . diclofenac sodium (VOLTAREN) 1 % GEL Apply 1 application topically 4 (four) times  daily.      . fish oil-omega-3 fatty acids 1000 MG capsule Take 1 g by mouth 3 (three) times daily.        Marland Kitchen HYDROcodone-acetaminophen (VICODIN) 5-500 MG per tablet Take 1 tablet by mouth every 8 (eight) hours as needed for pain.  60 tablet  3  . lidocaine (XYLOCAINE) 5 % ointment Apply topically daily.       No current facility-administered medications on file prior to visit.    BP 118/84  Pulse 88  Temp(Src) 98 F (36.7 C) (Oral)  Wt 271 lb (122.925 kg)  BMI 38.88 kg/m2       Objective:   Physical Exam  Constitutional: He is oriented to person, place, and time. He appears well-developed and well-nourished.  Cardiovascular: Normal rate, regular rhythm and normal heart sounds.   Pulmonary/Chest: Effort normal.  Neurological: He is alert and oriented to person, place, and time.  Psychiatric: He has a normal mood and affect. His behavior is normal.          Assessment & Plan:

## 2012-07-26 NOTE — Assessment & Plan Note (Addendum)
Patient experiencing intermittent exacerbation of low back pain. He has had good response to heating pad and muscle relaxer.  Continue flexeril 10 mg qd prn.

## 2012-07-26 NOTE — Assessment & Plan Note (Signed)
A1c slightly higher - likely secondary to lack of physical activity.  Continue same dose of metformin and carb modified diet. Monitor A1c in 3 months.  Lab Results  Component Value Date   HGBA1C 6.8* 07/09/2012

## 2012-07-30 ENCOUNTER — Telehealth: Payer: Self-pay | Admitting: Internal Medicine

## 2012-07-30 NOTE — Telephone Encounter (Signed)
Patient Information:  Caller Name: Nickoles  Phone: [phone]  Patient: [name], [name]  Gender: Male  DOB: Apr 30, 1970  Age: 42 Years  PCP: Artist Pais Doe-Hyun Molly Maduro) (Adults only)  Office Follow Up:  Does the office need to follow up with this patient?: Yes  Instructions For The Office: PLEASE REVIEW RN NOTE AND F/U W/ PT.  RN Note:  Pt was treated for Sinusitis on 5-27, Pt states he has same pain. Pt has Sinus pain w/ Right sided Headache and Face pain. Appt offered.  Pt was seen on 6-26 by Dr Artist Pais, would like to know if antibiotic would be called since he was recently evaluated.  Pt uses [facility], [employer], info in [place].  PLEASE REVIEW W/ MD AND F/U W/ PT.  Symptoms  Reason For Call & Symptoms: Sinus Pressure, Right Ear and Face pain  Reviewed Health History In EMR: Yes  Reviewed Medications In EMR: Yes  Reviewed Allergies In EMR: Yes  Reviewed Surgeries / Procedures: Yes  Date of Onset of Symptoms: 07/30/2012  Treatments Tried: Sinus med, name uncertain.  Treatments Tried Worked: No  Guideline(s) Used:  Sinus Pain and Congestion  Disposition Per Guideline:   Go to Office Now  Reason For Disposition Reached:   Severe sinus pain  Advice Given:  N/A  Patient Refused Recommendation:  Patient Will Follow Up With Office Later  Note sent to MD per Pt's request

## 2012-07-31 ENCOUNTER — Encounter: Payer: Self-pay | Admitting: Family Medicine

## 2012-07-31 ENCOUNTER — Ambulatory Visit (INDEPENDENT_AMBULATORY_CARE_PROVIDER_SITE_OTHER): Payer: BC Managed Care – PPO | Admitting: Family Medicine

## 2012-07-31 VITALS — BP 110/78 | Temp 98.6°F | Wt 268.0 lb

## 2012-07-31 DIAGNOSIS — M26609 Unspecified temporomandibular joint disorder, unspecified side: Secondary | ICD-10-CM

## 2012-07-31 DIAGNOSIS — J309 Allergic rhinitis, unspecified: Secondary | ICD-10-CM

## 2012-07-31 NOTE — Patient Instructions (Addendum)
INSTRUCTIONS FOR UPPER RESPIRATORY INFECTION:  -zyrtec daily  -nasal saline wash 2-3 times daily (use prepackaged nasal saline or bottled/distilled water if making your own)   -clean nose with nasal saline before using the nasal steroid or sinex  -can use sinex or afrin nasal spray for drainage and nasal congestion - but do NOT use longer then 3-4 days  -can use tylenol or ibuprofen as directed for aches and sorethroat  -follow up if you have fevers, persistent facial pain, tooth pain, difficulty breathing or are worsening or not getting better in 5-7 days  -jaw exercies provided

## 2012-07-31 NOTE — Telephone Encounter (Signed)
appt set w/ Dr Elpidio Eric today @ 9:15am

## 2012-07-31 NOTE — Progress Notes (Signed)
Chief Complaint  Patient presents with  . pain right side of face    HPI:  Acute visit for ear pain: -started: yesterday -symptoms: sinus congestion, R ear and sinuses hurt, sore throat, cough, tooth -denies: fevers, SOB, NVD, strep exposure -tried: flonase - used allegra in the past -hx of allergies  ROS: See pertinent positives and negatives per HPI.  Past Medical History  Diagnosis Date  . Allergy   . Hyperlipidemia   . Gout   . Seizure disorder   . H/O: CVA (cardiovascular accident)   . LBP (low back pain)   . Knee pain, bilateral   . Plantar fasciitis     followed by Dr Harriet Pho    Family History  Problem Relation Age of Onset  . Diabetes Father   . Hypertension Father   . Heart attack Maternal Grandfather   . Cancer Maternal Uncle   . Allergies Daughter     History   Social History  . Marital Status: Married    Spouse Name: N/A    Number of Children: 2  . Years of Education: N/A   Occupational History  . Production designer, theatre/television/film and clergy    Social History Main Topics  . Smoking status: Former Smoker -- 4 years    Types: Cigarettes    Quit date: 12/22/1990  . Smokeless tobacco: None     Comment: 1-2 packs a week  . Alcohol Use: No  . Drug Use: No  . Sexually Active: None   Other Topics Concern  . None   Social History Narrative  . None    Current outpatient prescriptions:cyclobenzaprine (FLEXERIL) 10 MG tablet, Take 1 tablet (10 mg total) by mouth as needed., Disp: 30 tablet, Rfl: 1;  diclofenac sodium (VOLTAREN) 1 % GEL, Apply 1 application topically 4 (four) times daily., Disp: , Rfl: ;  fish oil-omega-3 fatty acids 1000 MG capsule, Take 1 g by mouth 3 (three) times daily.  , Disp: , Rfl:  fluticasone (FLONASE) 50 MCG/ACT nasal spray, Place 2 sprays into the nose daily., Disp: 16 g, Rfl: 5;  HYDROcodone-acetaminophen (VICODIN) 5-500 MG per tablet, Take 1 tablet by mouth every 8 (eight) hours as needed for pain., Disp: 60 tablet, Rfl: 3;  lidocaine  (XYLOCAINE) 5 % ointment, Apply topically daily., Disp: , Rfl:  metFORMIN (GLUCOPHAGE) 500 MG tablet, Take 2 tablets (1,000 mg total) by mouth 2 (two) times daily with a meal., Disp: 180 tablet, Rfl: 1;  pravastatin (PRAVACHOL) 40 MG tablet, Take 1 tablet (40 mg total) by mouth daily., Disp: 90 tablet, Rfl: 1  EXAM:  Filed Vitals:   07/31/12 0917  BP: 110/78  Temp: 98.6 F (37 C)    Body mass index is 38.45 kg/(m^2).  GENERAL: vitals reviewed and listed above, alert, oriented, appears well hydrated and in no acute distress  HEENT: atraumatic, conjunttiva clear, no obvious abnormalities on inspection of external nose and ears, normal appearance of ear canals and TMs, clear nasal congestion with boggy turbinates, mild post oropharyngeal erythema with PND, no tonsillar edema or exudate, no sinus TTP, mild TMJ R  NECK: no obvious masses on inspection  LUNGS: clear to auscultation bilaterally, no wheezes, rales or rhonchi, good air movement  CV: HRRR, no peripheral edema  MS: moves all extremities without noticeable abnormality  PSYCH: pleasant and cooperative, no obvious depression or anxiety  ASSESSMENT AND PLAN:  Discussed the following assessment and plan:  Allergic rhinitis -no signs of infection -flonase daily, zyrtec daily, aftin for next 3-4  days - risks discussed -follow up as needed  TMJ dysfunction -exercises given -follow up instructions  -Patient advised to return or notify a doctor immediately if symptoms worsen or persist or new concerns arise.  Patient Instructions  INSTRUCTIONS FOR UPPER RESPIRATORY INFECTION:  -zyrtec daily  -nasal saline wash 2-3 times daily (use prepackaged nasal saline or bottled/distilled water if making your own)   -clean nose with nasal saline before using the nasal steroid or sinex  -can use sinex or afrin nasal spray for drainage and nasal congestion - but do NOT use longer then 3-4 days  -can use tylenol or ibuprofen as  directed for aches and sorethroat  -follow up if you have fevers, persistent facial pain, tooth pain, difficulty breathing or are worsening or not getting better in 5-7 days  -jaw exercies provided     Kriste Basque R.

## 2012-08-09 ENCOUNTER — Other Ambulatory Visit: Payer: Self-pay

## 2012-09-05 ENCOUNTER — Other Ambulatory Visit: Payer: Self-pay

## 2012-10-18 ENCOUNTER — Other Ambulatory Visit: Payer: BC Managed Care – PPO

## 2012-10-25 ENCOUNTER — Ambulatory Visit: Payer: BC Managed Care – PPO | Admitting: Internal Medicine

## 2012-11-06 ENCOUNTER — Other Ambulatory Visit (INDEPENDENT_AMBULATORY_CARE_PROVIDER_SITE_OTHER): Payer: BC Managed Care – PPO

## 2012-11-06 DIAGNOSIS — E785 Hyperlipidemia, unspecified: Secondary | ICD-10-CM

## 2012-11-06 DIAGNOSIS — E119 Type 2 diabetes mellitus without complications: Secondary | ICD-10-CM

## 2012-11-06 LAB — HEPATIC FUNCTION PANEL
ALT: 16 U/L (ref 0–53)
Bilirubin, Direct: 0 mg/dL (ref 0.0–0.3)
Total Bilirubin: 0.4 mg/dL (ref 0.3–1.2)

## 2012-11-06 LAB — LIPID PANEL
HDL: 44.5 mg/dL (ref >39.00)
Total CHOL/HDL Ratio: 4

## 2012-11-06 LAB — CK: Total CK: 222 U/L (ref 7–232)

## 2012-11-06 LAB — BASIC METABOLIC PANEL
CO2: 27 mEq/L (ref 19–32)
Calcium: 9 mg/dL (ref 8.4–10.5)
GFR: 100.05 mL/min (ref >60.00)
Sodium: 139 mEq/L (ref 135–145)

## 2012-11-07 ENCOUNTER — Other Ambulatory Visit: Payer: BC Managed Care – PPO

## 2012-11-15 ENCOUNTER — Encounter: Payer: Self-pay | Admitting: Internal Medicine

## 2012-11-15 ENCOUNTER — Ambulatory Visit (INDEPENDENT_AMBULATORY_CARE_PROVIDER_SITE_OTHER): Payer: BC Managed Care – PPO | Admitting: Internal Medicine

## 2012-11-15 VITALS — BP 112/82 | HR 88 | Temp 98.7°F | Ht 70.0 in | Wt 266.0 lb

## 2012-11-15 DIAGNOSIS — R209 Unspecified disturbances of skin sensation: Secondary | ICD-10-CM

## 2012-11-15 DIAGNOSIS — E119 Type 2 diabetes mellitus without complications: Secondary | ICD-10-CM

## 2012-11-15 DIAGNOSIS — R200 Anesthesia of skin: Secondary | ICD-10-CM

## 2012-11-15 DIAGNOSIS — N400 Enlarged prostate without lower urinary tract symptoms: Secondary | ICD-10-CM

## 2012-11-15 DIAGNOSIS — Z23 Encounter for immunization: Secondary | ICD-10-CM

## 2012-11-15 DIAGNOSIS — E785 Hyperlipidemia, unspecified: Secondary | ICD-10-CM

## 2012-11-15 MED ORDER — ROSUVASTATIN CALCIUM 20 MG PO TABS: 20.0000 mg | ORAL_TABLET | Freq: Every day | ORAL | Status: DC

## 2012-11-15 NOTE — Assessment & Plan Note (Signed)
Continue same dose of metformin. Patient plans to reduce his carbohydrate intake to 30 g per meal. If worsening A1c we discussed adding second agent to metformin. Lab Results  Component Value Date   HGBA1C 6.7* 11/06/2012

## 2012-11-15 NOTE — Patient Instructions (Signed)
Please complete the following lab tests before your next follow up appointment: BMET, A1c - 250.00 FLP, LFTs - 272.4 PSA - v76.44 

## 2012-11-15 NOTE — Progress Notes (Signed)
Subjective:    Patient ID: [name], male    DOB: 07-16-1970, 42 y.o.   MRN: [id number]  HPI  42 year old African American male with history of type 2 diabetes, hyperlipidemia and chronic joint pains for followup. Patient has lost 2 pounds since previous visit. Patient increasing his intake of vegetables but has not significantly reduced his carbohydrate intake. Patient's A1c slightly worse.  Patient reports good compliance with pravastatin. The he denies any adverse effects. Previously atorvastatin was discontinued due to exacerbation of musculoskeletal complaints.  Patient also complains of intermittent pain and numbness in the fourth and fifth digit of his left hand. 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  Patient also requests prostate cancer screening. He denies any family history of prostate cancer. He has difficulty starting urine stream at times. He drinks several caffeinated beverages per day.  Review of Systems Negative for chest pain.  He complains of difficulty urine stream.    Past Medical History  Diagnosis Date  . Allergy   . Hyperlipidemia   . Gout   . Seizure disorder   . H/O: CVA (cardiovascular accident)   . LBP (low back pain)   . Knee pain, bilateral   . Plantar fasciitis     followed by Dr Harriet Pho    History   Social History  . Marital Status: Married    Spouse Name: N/A    Number of Children: 2  . Years of Education: N/A   Occupational History  . Production designer, theatre/television/film and clergy    Social History Main Topics  . Smoking status: Former Smoker -- 4 years    Types: Cigarettes    Quit date: 12/22/1990  . Smokeless tobacco: Not on file     Comment: 1-2 packs a week  . Alcohol Use: No  . Drug Use: No  . Sexual Activity: Not on file   Other Topics Concern  . Not on file   Social History Narrative  . No narrative on file    Past Surgical History  Procedure Laterality Date  . Right shoulder surgery      x2  . Left heel surgery      x2    Family History  Problem Relation Age of Onset  . Diabetes Father   . Hypertension Father   . Heart attack Maternal Grandfather   . Cancer Maternal Uncle   . Allergies Daughter     Allergies  Allergen Reactions  . Atorvastatin   . Cymbalta [Duloxetine Hcl]     Stomach upset, difficulty urinating, decreased libido    Current Outpatient Prescriptions on File Prior to Visit  Medication Sig Dispense Refill  . cyclobenzaprine (FLEXERIL) 10 MG tablet Take 1 tablet (10 mg total) by mouth as needed.  30 tablet  1  . diclofenac sodium (VOLTAREN) 1 % GEL Apply 1 application topically 4 (four) times daily.      . fish oil-omega-3 fatty acids 1000 MG capsule Take 1 g by mouth 3 (three) times daily.        Marland Kitchen HYDROcodone-acetaminophen (VICODIN) 5-500 MG per tablet Take 1 tablet by mouth every 8 (eight) hours as needed for pain.  60 tablet  3  . lidocaine (XYLOCAINE) 5 % ointment Apply topically daily.      . metFORMIN (GLUCOPHAGE) 500 MG tablet Take 2 tablets (1,000 mg total) by mouth 2 (two) times daily with a meal.  180 tablet  1   No current facility-administered  medications on file prior to visit.    BP 112/82  Pulse 88  Temp(Src) 98.7 F (37.1 C) (Oral)  Ht 5\' 10"  (1.778 m)  Wt 266 lb (120.657 kg)  BMI 38.17 kg/m2    Objective:   Physical Exam  Constitutional: He is oriented to person, place, and time. He appears well-developed and well-nourished.  HENT:  Head: Normocephalic and atraumatic.  Right Ear: External ear normal.  Left Ear: External ear normal.  Neck: Neck supple.  No carotid bruit  Cardiovascular: Normal rate, regular rhythm and normal heart sounds.   No murmur heard. Pulmonary/Chest: Effort normal and breath sounds normal. He has no wheezes.  Genitourinary: Rectum normal and prostate normal.  Musculoskeletal: He exhibits no edema.  Neurological: He is alert and oriented to person, place, and time. No cranial nerve deficit.   Psychiatric: He has a normal mood and affect. His behavior is normal.          Assessment & Plan:

## 2012-11-15 NOTE — Assessment & Plan Note (Signed)
I recommended changing pravastatin to Crestor. He has tolerated Crestor in the past. Lab Results  Component Value Date   CHOL 187 11/06/2012   HDL 44.50 11/06/2012   LDLCALC 125* 11/06/2012   LDLDIRECT 148.1 11/04/2010   TRIG 86.0 11/06/2012   CHOLHDL 4 11/06/2012   Lab Results  Component Value Date   ALT 16 11/06/2012   AST 19 11/06/2012   ALKPHOS 46 11/06/2012   BILITOT 0.4 11/06/2012

## 2012-11-15 NOTE — Assessment & Plan Note (Signed)
Patient complains of difficulty starting urine stream. On exam prostate is slightly enlarged but no other abnormal findings. Patient advised to decrease caffeine use. Obtain baseline PSA before next office visit.

## 2012-11-15 NOTE — Assessment & Plan Note (Signed)
Patient has intermittent numbness and tingling in the fourth and fifth digit of left hand. I suspect compressive ulnar nerve neuropathy. Patient advised to use cushion for left elbow.

## 2013-01-14 ENCOUNTER — Encounter: Payer: Self-pay | Admitting: Pulmonary Disease

## 2013-01-14 ENCOUNTER — Ambulatory Visit (INDEPENDENT_AMBULATORY_CARE_PROVIDER_SITE_OTHER): Payer: BC Managed Care – PPO | Admitting: Pulmonary Disease

## 2013-01-14 VITALS — BP 138/76 | HR 95 | Temp 98.0°F | Ht 70.0 in | Wt 270.6 lb

## 2013-01-14 DIAGNOSIS — G4733 Obstructive sleep apnea (adult) (pediatric): Secondary | ICD-10-CM

## 2013-01-14 NOTE — Progress Notes (Signed)
   Subjective:    Patient ID: [name], male    DOB: March 07, 1970, 42 y.o.   MRN: [id number]  HPI Patient comes in today for followup of his obstructive sleep apnea. He has been wearing CPAP compliantly, but feels that he may not be sleeping as well as prior. He has gained a small amount of weight since last visit, but his CPAP machine is aged and now the on/off button is no longer working properly.  The patient also is in need of new supplies.   Review of Systems  Constitutional: Negative for fever and unexpected weight change.  HENT: Positive for congestion, postnasal drip, rhinorrhea, sinus pressure and sneezing. Negative for dental problem, ear pain, nosebleeds, sore throat and trouble swallowing.   Eyes: Negative for redness and itching.  Respiratory: Negative for cough, chest tightness, shortness of breath and wheezing.   Cardiovascular: Negative for palpitations and leg swelling.  Gastrointestinal: Negative for nausea and vomiting.  Genitourinary: Negative for dysuria.  Musculoskeletal: Negative for joint swelling.  Skin: Negative for rash.  Neurological: Positive for headaches.  Hematological: Does not bruise/bleed easily.  Psychiatric/Behavioral: Negative for dysphoric mood. The patient is not nervous/anxious.        Objective:   Physical Exam Overweight male in no acute distress Nose without purulence or discharge noted No skin breakdown or pressure necrosis from the CPAP mask Neck without lymphadenopathy or thyromegaly Lower extremities with no edema, no cyanosis Alert and oriented, moves all 4 extremities. Does not appear to be sleepy.       Assessment & Plan:

## 2013-01-14 NOTE — Patient Instructions (Signed)
Will see if we can get you a new cpap machine.  Will set on the current setting, but if you do not see improvement in your daytime alertness, let us know and we can re-optimize your pressure on the new machine.  Let me know. Work on weight loss followup with me in one year if doing well.

## 2013-01-14 NOTE — Assessment & Plan Note (Signed)
The patient is doing okay with CPAP, but has noticed some increase in his symptoms. His current machine is at least 43 years old, and now some of the buttons are not working properly. He will need to get a new CPAP device, and I've also encouraged him to work aggressively on weight loss. We can optimize his pressure again if he does not see improvement with a new device.

## 2013-02-08 ENCOUNTER — Other Ambulatory Visit: Payer: BC Managed Care – PPO

## 2013-02-15 ENCOUNTER — Ambulatory Visit: Payer: BC Managed Care – PPO | Admitting: Internal Medicine

## 2013-03-03 ENCOUNTER — Other Ambulatory Visit: Payer: Self-pay | Admitting: Internal Medicine

## 2013-03-08 ENCOUNTER — Other Ambulatory Visit (INDEPENDENT_AMBULATORY_CARE_PROVIDER_SITE_OTHER): Payer: BC Managed Care – PPO

## 2013-03-08 DIAGNOSIS — Z125 Encounter for screening for malignant neoplasm of prostate: Secondary | ICD-10-CM

## 2013-03-08 DIAGNOSIS — E119 Type 2 diabetes mellitus without complications: Secondary | ICD-10-CM

## 2013-03-08 DIAGNOSIS — E785 Hyperlipidemia, unspecified: Secondary | ICD-10-CM

## 2013-03-08 LAB — HEMOGLOBIN A1C: Hgb A1c MFr Bld: 6.5 % (ref 4.6–6.5)

## 2013-03-11 LAB — LIPID PANEL
CHOLESTEROL: 126 mg/dL (ref 0–200)
HDL: 45 mg/dL (ref >39.00)
LDL CALC: 68 mg/dL (ref 0–99)
Total CHOL/HDL Ratio: 3
Triglycerides: 66 mg/dL (ref 0.0–149.0)
VLDL: 13.2 mg/dL (ref 0.0–40.0)

## 2013-03-11 LAB — BASIC METABOLIC PANEL
BUN: 11 mg/dL (ref 6–23)
CHLORIDE: 106 meq/L (ref 96–112)
CO2: 28 mEq/L (ref 19–32)
CREATININE: 1 mg/dL (ref 0.4–1.5)
Calcium: 9.3 mg/dL (ref 8.4–10.5)
GFR: 110.89 mL/min (ref >60.00)
GLUCOSE: 79 mg/dL (ref 70–99)
Potassium: 4.3 mEq/L (ref 3.5–5.1)
Sodium: 141 mEq/L (ref 135–145)

## 2013-03-11 LAB — PSA: PSA: 0.47 ng/mL (ref 0.10–4.00)

## 2013-03-11 LAB — HEPATIC FUNCTION PANEL
ALK PHOS: 49 U/L (ref 39–117)
ALT: 13 U/L (ref 0–53)
AST: 16 U/L (ref 0–37)
Albumin: 4.2 g/dL (ref 3.5–5.2)
BILIRUBIN DIRECT: 0 mg/dL (ref 0.0–0.3)
BILIRUBIN TOTAL: 0.4 mg/dL (ref 0.3–1.2)
TOTAL PROTEIN: 7.2 g/dL (ref 6.0–8.3)

## 2013-03-15 ENCOUNTER — Encounter: Payer: Self-pay | Admitting: Internal Medicine

## 2013-03-15 ENCOUNTER — Ambulatory Visit (INDEPENDENT_AMBULATORY_CARE_PROVIDER_SITE_OTHER): Payer: BC Managed Care – PPO | Admitting: Internal Medicine

## 2013-03-15 VITALS — BP 120/84 | HR 84 | Temp 98.3°F | Ht 70.0 in | Wt 258.0 lb

## 2013-03-15 DIAGNOSIS — E119 Type 2 diabetes mellitus without complications: Secondary | ICD-10-CM

## 2013-03-15 DIAGNOSIS — E669 Obesity, unspecified: Secondary | ICD-10-CM

## 2013-03-15 MED ORDER — ROSUVASTATIN CALCIUM 20 MG PO TABS: 20.0000 mg | ORAL_TABLET | Freq: Every day | ORAL | Status: DC

## 2013-03-15 NOTE — Assessment & Plan Note (Signed)
Patient encouraged to continue low carbohydrate diet. Add non-weight bearing exercises. He has history of chronic knee pain and back pain.

## 2013-03-15 NOTE — Progress Notes (Signed)
Subjective:    Patient ID: [name], male    DOB: 03-03-1970, 43 y.o.   MRN: [id number]  HPI  43 year old African American male with history of hyperlipidemia,  type 2 diabetes and chronic back pain for followup.  Type 2 diabetes-patient now following low carbohydrate diet and consistent basis. Patient has managed to lose significant amount of weight. Patient reports his morning blood sugars are much improved.   Review of Systems Negative for paresthesias, negative for chest pain    Past Medical History  Diagnosis Date  . Allergy   . Hyperlipidemia   . Gout   . Seizure disorder   . H/O: CVA (cardiovascular accident)   . LBP (low back pain)   . Knee pain, bilateral   . Plantar fasciitis     followed by Dr Harriet Pho    History   Social History  . Marital Status: Married    Spouse Name: N/A    Number of Children: 2  . Years of Education: N/A   Occupational History  . Production designer, theatre/television/film and clergy    Social History Main Topics  . Smoking status: Former Smoker -- 4 years    Types: Cigarettes    Quit date: 12/22/1990  . Smokeless tobacco: Not on file     Comment: 1-2 packs a week  . Alcohol Use: No  . Drug Use: No  . Sexual Activity: Not on file   Other Topics Concern  . Not on file   Social History Narrative  . No narrative on file    Past Surgical History  Procedure Laterality Date  . Right shoulder surgery      x2  . Left heel surgery      x2    Family History  Problem Relation Age of Onset  . Diabetes Father   . Hypertension Father   . Heart attack Maternal Grandfather   . Cancer Maternal Uncle   . Allergies Daughter     Allergies  Allergen Reactions  . Atorvastatin   . Cymbalta [Duloxetine Hcl]     Stomach upset, difficulty urinating, decreased libido    Current Outpatient Prescriptions on File Prior to Visit  Medication Sig Dispense Refill  . cyclobenzaprine (FLEXERIL) 10 MG tablet Take 1 tablet (10 mg total) by mouth as needed.  30  tablet  1  . diclofenac sodium (VOLTAREN) 1 % GEL Apply 1 application topically 4 (four) times daily.      . fish oil-omega-3 fatty acids 1000 MG capsule Take 1 g by mouth 3 (three) times daily.        Marland Kitchen HYDROcodone-acetaminophen (VICODIN) 5-500 MG per tablet Take 1 tablet by mouth every 8 (eight) hours as needed for pain.  60 tablet  3  . lidocaine (XYLOCAINE) 5 % ointment Apply topically daily.      . metFORMIN (GLUCOPHAGE) 500 MG tablet TAKE 2 TABLETS BY MOUTH TWICE A DAY WITH A MEAL  180 tablet  1   No current facility-administered medications on file prior to visit.    BP 120/84  Pulse 84  Temp(Src) 98.3 F (36.8 C) (Oral)  Ht 5\' 10"  (1.778 m)  Wt 258 lb (117.028 kg)  BMI 37.02 kg/m2    Objective:   Physical Exam  Constitutional: He is oriented to person, place, and time. He appears well-developed and well-nourished. No distress.  Neck: Neck supple.  No carotid bruit  Cardiovascular: Normal rate, regular rhythm and normal heart sounds.   No murmur heard. Pulmonary/Chest: Effort  normal and breath sounds normal. He has no wheezes.  Musculoskeletal:  Trace lower extremity edema bilaterally  Neurological: He is alert and oriented to person, place, and time. No cranial nerve deficit.  Psychiatric: He has a normal mood and affect. His behavior is normal.          Assessment & Plan:

## 2013-03-15 NOTE — Progress Notes (Signed)
Pre visit review using our clinic review tool, if applicable. No additional management support is needed unless otherwise documented below in the visit note. 

## 2013-03-15 NOTE — Assessment & Plan Note (Signed)
A1c improved to with adherence to carb modified diet. Further weight loss encouraged. Higher dose of metformin limited by gastrointestinal side effects. Lab Results  Component Value Date   HGBA1C 6.5 03/08/2013

## 2013-03-15 NOTE — Patient Instructions (Addendum)
Continue low carb diet and non weight bearing exercises when you can Please complete the following lab tests before your next follow up appointment: Please complete the following lab tests before your next follow up appointment: BMET, A1c - 401.9, 250.00

## 2013-03-22 ENCOUNTER — Telehealth: Payer: Self-pay

## 2013-03-22 NOTE — Telephone Encounter (Signed)
Relevant patient education assigned to patient using Emmi. ° °

## 2013-03-26 ENCOUNTER — Other Ambulatory Visit: Payer: Self-pay | Admitting: Internal Medicine

## 2013-07-17 ENCOUNTER — Ambulatory Visit: Payer: BC Managed Care – PPO | Admitting: Internal Medicine

## 2013-08-07 ENCOUNTER — Other Ambulatory Visit: Payer: Self-pay | Admitting: Internal Medicine

## 2013-08-28 ENCOUNTER — Ambulatory Visit (INDEPENDENT_AMBULATORY_CARE_PROVIDER_SITE_OTHER): Payer: BC Managed Care – PPO | Admitting: Internal Medicine

## 2013-08-28 ENCOUNTER — Encounter: Payer: Self-pay | Admitting: Internal Medicine

## 2013-08-28 ENCOUNTER — Other Ambulatory Visit: Payer: Self-pay | Admitting: Internal Medicine

## 2013-08-28 VITALS — BP 114/90 | HR 100 | Temp 98.5°F | Ht 70.0 in | Wt 254.0 lb

## 2013-08-28 DIAGNOSIS — R519 Headache, unspecified: Secondary | ICD-10-CM | POA: Insufficient documentation

## 2013-08-28 DIAGNOSIS — M5440 Lumbago with sciatica, unspecified side: Secondary | ICD-10-CM

## 2013-08-28 DIAGNOSIS — E119 Type 2 diabetes mellitus without complications: Secondary | ICD-10-CM

## 2013-08-28 DIAGNOSIS — R51 Headache: Secondary | ICD-10-CM

## 2013-08-28 DIAGNOSIS — M5430 Sciatica, unspecified side: Secondary | ICD-10-CM

## 2013-08-28 MED ORDER — MELOXICAM 7.5 MG PO TABS: 7.5000 mg | ORAL_TABLET | Freq: Every day | ORAL | Status: DC

## 2013-08-28 NOTE — Progress Notes (Signed)
Pre visit review using our clinic review tool, if applicable. No additional management support is needed unless otherwise documented below in the visit note. 

## 2013-08-28 NOTE — Patient Instructions (Addendum)
Follow up with your dentist as directed. Please contact our office if your symptoms do not improve or gets worse. Please complete the following lab tests before your next follow up appointment: BMET, A1c - 250.00

## 2013-08-28 NOTE — Assessment & Plan Note (Signed)
Unchanged.  Patient to try acupuncture.

## 2013-08-28 NOTE — Assessment & Plan Note (Signed)
Stable.  Monitor A1c. Lab Results  Component Value Date   HGBA1C 6.5 03/08/2013   HGBA1C 6.7* 11/06/2012   HGBA1C 6.8* 07/09/2012   Lab Results  Component Value Date   MICROALBUR 0.5 11/04/2010   LDLCALC 68 03/08/2013   CREATININE 1.0 03/08/2013

## 2013-08-28 NOTE — Progress Notes (Signed)
Subjective:    Patient ID: [name], male    DOB: [birth year], 43 y.o.   MRN: [id number]  HPI  43 year old African American male with history of type 2 diabetes, chronic low back pain and hyperlipidemia for routine followup. Interval medical history-[facility] for severe facial pain. His symptoms started approximately 5 days ago. He reports CT of sinuses and head were negative. He was prescribed carbamazepine 100 mg every 12 hours for presumed trigeminal neuralgia.  His symptoms beginning to improve over the last 24-48 hours. He reports intermittent stabbing sensation in her right ear. His symptoms can be triggered with chewing. He also complains of pain in his right lower jaw.  DM II - stable.  Review of Systems Intermittent right sided headache,  No changes in vision.  No proximal muscle pain or weakness    Past Medical History  Diagnosis Date  . Allergy   . Hyperlipidemia   . Gout   . Seizure disorder   . H/O: CVA (cardiovascular accident)   . LBP (low back pain)   . Knee pain, bilateral   . Plantar fasciitis     followed by Dr Harriet PhoAjlouny    History   Social History  . Marital Status: Married    Spouse Name: N/A    Number of Children: 2  . Years of Education: N/A   Occupational History  . Production designer, theatre/television/filmadministrator and clergy    Social History Main Topics  . Smoking status: Former Smoker -- 4 years    Types: Cigarettes    Quit date: 12/22/1990  . Smokeless tobacco: Not on file     Comment: 1-2 packs a week  . Alcohol Use: No  . Drug Use: No  . Sexual Activity: Not on file   Other Topics Concern  . Not on file   Social History Narrative  . No narrative on file    Past Surgical History  Procedure Laterality Date  . Right shoulder surgery      x2  . Left heel surgery      x2    Family History  Problem Relation Age of Onset  . Diabetes Father   . Hypertension Father   . Heart attack Maternal Grandfather   . Cancer  Maternal Uncle   . Allergies Daughter     Allergies  Allergen Reactions  . Atorvastatin   . Cymbalta [Duloxetine Hcl]     Stomach upset, difficulty urinating, decreased libido    Current Outpatient Prescriptions on File Prior to Visit  Medication Sig Dispense Refill  . CRESTOR 20 MG tablet TAKE 1 TABLET EVERY DAY  30 tablet  1  . cyclobenzaprine (FLEXERIL) 10 MG tablet Take 1 tablet (10 mg total) by mouth as needed.  30 tablet  1  . diclofenac sodium (VOLTAREN) 1 % GEL Apply 1 application topically 4 (four) times daily.      . fish oil-omega-3 fatty acids 1000 MG capsule Take 1 g by mouth 3 (three) times daily.        Marland Kitchen. HYDROcodone-acetaminophen (VICODIN) 5-500 MG per tablet Take 1 tablet by mouth every 8 (eight) hours as needed for pain.  60 tablet  3  . lidocaine (XYLOCAINE) 5 % ointment Apply topically daily.      . metFORMIN (GLUCOPHAGE) 500 MG tablet TAKE 2 TABLETS BY MOUTH TWICE A DAY WITH A MEAL  180 tablet  1   No current facility-administered medications on file prior to visit.  BP 114/90  Pulse 100  Temp(Src) 98.5 F (36.9 C) (Oral)  Ht 5\' 10"  (1.778 m)  Wt 254 lb (115.214 kg)  BMI 36.45 kg/m2    Objective:   Physical Exam  Constitutional: He is oriented to person, place, and time. He appears well-developed and well-nourished. No distress.  HENT:  Head: Normocephalic and atraumatic.  Right Ear: External ear normal.  Left Ear: External ear normal.  Right TMJ pain, tenderness of right lower molar tooth  Cardiovascular: Normal rate, regular rhythm and normal heart sounds.   No murmur heard. Pulmonary/Chest: Effort normal and breath sounds normal.  Musculoskeletal: He exhibits no edema.  Neurological: He is alert and oriented to person, place, and time. No cranial nerve deficit.  Skin: Skin is warm and dry.  Psychiatric: He has a normal mood and affect. His behavior is normal.          Assessment & Plan:

## 2013-08-28 NOTE — Assessment & Plan Note (Addendum)
I doubt patient's right facial pain from trigeminal neuralgia. I suspect combination of TMJ versus referred pain from right lower molar. Trial of anti-inflammatories-meloxicam 7.5 mg once daily as needed. If symptoms improve over the next 3-5 days, patient plans to defer referral to neurologist-Dr. Caswell CorwinHoward Kraft.  I advised to follow up with his dentist as soon as possible.  Check sed rate and CK

## 2013-08-29 LAB — BASIC METABOLIC PANEL
BUN: 10 mg/dL (ref 6–23)
CALCIUM: 9.3 mg/dL (ref 8.4–10.5)
CO2: 26 meq/L (ref 19–32)
Chloride: 106 mEq/L (ref 96–112)
Creatinine, Ser: 1 mg/dL (ref 0.4–1.5)
GFR: 106.75 mL/min (ref >60.00)
GLUCOSE: 124 mg/dL — AB (ref 70–99)
Potassium: 3.8 mEq/L (ref 3.5–5.1)
SODIUM: 139 meq/L (ref 135–145)

## 2013-08-29 LAB — SEDIMENTATION RATE: SED RATE: 9 mm/h (ref 0–22)

## 2013-08-29 LAB — HEMOGLOBIN A1C: HEMOGLOBIN A1C: 6.5 % (ref 4.6–6.5)

## 2013-08-29 LAB — MICROALBUMIN, URINE: Microalb, Ur: 3.85 mg/dL — ABNORMAL HIGH (ref 0.00–1.89)

## 2013-09-03 ENCOUNTER — Other Ambulatory Visit: Payer: Self-pay | Admitting: *Deleted

## 2013-09-03 MED ORDER — LOSARTAN POTASSIUM 25 MG PO TABS: 25.0000 mg | ORAL_TABLET | Freq: Every day | ORAL | Status: DC

## 2013-10-01 ENCOUNTER — Other Ambulatory Visit (INDEPENDENT_AMBULATORY_CARE_PROVIDER_SITE_OTHER): Payer: BC Managed Care – PPO

## 2013-10-01 DIAGNOSIS — I10 Essential (primary) hypertension: Secondary | ICD-10-CM

## 2013-10-01 LAB — BASIC METABOLIC PANEL
BUN: 9 mg/dL (ref 6–23)
CO2: 24 mEq/L (ref 19–32)
Calcium: 9.8 mg/dL (ref 8.4–10.5)
Chloride: 105 mEq/L (ref 96–112)
Creatinine, Ser: 1 mg/dL (ref 0.4–1.5)
GFR: 109.28 mL/min (ref >60.00)
Glucose, Bld: 178 mg/dL — ABNORMAL HIGH (ref 70–99)
Potassium: 3.6 mEq/L (ref 3.5–5.1)
SODIUM: 140 meq/L (ref 135–145)

## 2013-10-03 ENCOUNTER — Ambulatory Visit: Payer: BC Managed Care – PPO | Admitting: Internal Medicine

## 2013-10-08 ENCOUNTER — Other Ambulatory Visit: Payer: Self-pay | Admitting: Internal Medicine

## 2013-10-11 ENCOUNTER — Telehealth: Payer: Self-pay | Admitting: *Deleted

## 2013-10-11 NOTE — Telephone Encounter (Signed)
Left message on machine for patient to call the office to schedule an appointment for a BP check.

## 2013-11-12 ENCOUNTER — Encounter: Payer: Self-pay | Admitting: Family Medicine

## 2013-11-12 ENCOUNTER — Ambulatory Visit (INDEPENDENT_AMBULATORY_CARE_PROVIDER_SITE_OTHER): Payer: BC Managed Care – PPO | Admitting: Family Medicine

## 2013-11-12 VITALS — BP 122/78 | HR 96 | Temp 98.9°F | Ht 70.0 in | Wt 274.5 lb

## 2013-11-12 DIAGNOSIS — L309 Dermatitis, unspecified: Secondary | ICD-10-CM

## 2013-11-12 DIAGNOSIS — L853 Xerosis cutis: Secondary | ICD-10-CM

## 2013-11-12 DIAGNOSIS — E669 Obesity, unspecified: Secondary | ICD-10-CM

## 2013-11-12 DIAGNOSIS — Z23 Encounter for immunization: Secondary | ICD-10-CM

## 2013-11-12 DIAGNOSIS — E785 Hyperlipidemia, unspecified: Secondary | ICD-10-CM

## 2013-11-12 DIAGNOSIS — R600 Localized edema: Secondary | ICD-10-CM

## 2013-11-12 DIAGNOSIS — E119 Type 2 diabetes mellitus without complications: Secondary | ICD-10-CM

## 2013-11-12 NOTE — Progress Notes (Signed)
Pre visit review using our clinic review tool, if applicable. No additional management support is needed unless otherwise documented below in the visit note. 

## 2013-11-12 NOTE — Addendum Note (Signed)
Addended by: Johnella MoloneyFUNDERBURK, Bryttany Tortorelli A on: 11/12/2013 10:08 AM   Modules accepted: Orders

## 2013-11-12 NOTE — Patient Instructions (Addendum)
-  please use only hypoallergenic soaps and detergents  -CERAVE cream at least 1-2 times daily to arms, legs and back  -schedule eye exam  -increase exercise to at least 150 minutes per week - work with your back doctor to find activities that you tolerate - stationary bike, walking, water activities and consider working with your back doctor to taper off of you pain medications if they are not helping  -BEFORE YOU LEAVE: schedule follow up with Dr. Artist PaisYoo for your Physical exam in 3-4 months; flu shot

## 2013-11-12 NOTE — Progress Notes (Signed)
No chief complaint on file.   HPI:  [name], is a 43 yo M pt of Dr. Artist PaisYoo with a PMH sig for CVA, seizure disorder, diabetes, chronic low back pain with sciatica and facial pain, HLD and HTN here for acute visit for:  1) Itchy skin -itchy and dry skin on both legs and back -soap is lever, laundry detergent is gain, tried jergens lotion -chronic mild swelling in ankles and feet unchanged, sees podiatry, compression socks and elevation help -denies change in medication or lotions  2)HLD: -needs refill on crestor -wonders if he needs this  3)DM: -reporst home BS between 70-100s -no lows -working on diet, not getting much exercise -no eye exam this year -sees podiatry   ROS: See pertinent positives and negatives per HPI.  Past Medical History  Diagnosis Date  . Allergy   . Hyperlipidemia   . Gout   . Seizure disorder   . H/O: CVA (cardiovascular accident)   . LBP (low back pain)   . Knee pain, bilateral   . Plantar fasciitis     followed by Dr Harriet PhoAjlouny    Past Surgical History  Procedure Laterality Date  . Right shoulder surgery      x2  . Left heel surgery      x2    Family History  Problem Relation Age of Onset  . Diabetes Father   . Hypertension Father   . Heart attack Maternal Grandfather   . Cancer Maternal Uncle   . Allergies Daughter     History   Social History  . Marital Status: Married    Spouse Name: N/A    Number of Children: 2  . Years of Education: N/A   Occupational History  . Production designer, theatre/television/filmadministrator and clergy    Social History Main Topics  . Smoking status: Former Smoker -- 4 years    Types: Cigarettes    Quit date: 12/22/1990  . Smokeless tobacco: None     Comment: 1-2 packs a week  . Alcohol Use: No  . Drug Use: No  . Sexual Activity: None   Other Topics Concern  . None   Social History Narrative  . None    Current outpatient prescriptions:CRESTOR 20 MG tablet, TAKE 1 TABLET (20 MG TOTAL) BY MOUTH DAILY., Disp: 90 tablet,  Rfl: 1;  cyclobenzaprine (FLEXERIL) 10 MG tablet, Take 1 tablet (10 mg total) by mouth as needed., Disp: 30 tablet, Rfl: 1;  diclofenac sodium (VOLTAREN) 1 % GEL, Apply 1 application topically 4 (four) times daily., Disp: , Rfl: ;  fish oil-omega-3 fatty acids 1000 MG capsule, Take 1 g by mouth 3 (three) times daily.  , Disp: , Rfl:  HYDROcodone-acetaminophen (VICODIN) 5-500 MG per tablet, Take 1 tablet by mouth every 8 (eight) hours as needed for pain., Disp: 60 tablet, Rfl: 3;  lidocaine (XYLOCAINE) 5 % ointment, Apply topically daily., Disp: , Rfl: ;  losartan (COZAAR) 25 MG tablet, Take 1 tablet (25 mg total) by mouth daily., Disp: 90 tablet, Rfl: 0;  metFORMIN (GLUCOPHAGE) 500 MG tablet, TAKE 2 TABLETS BY MOUTH TWICE A DAY WITH A MEAL, Disp: 180 tablet, Rfl: 1  EXAM:  Filed Vitals:   11/12/13 0924  BP: 122/78  Pulse: 96  Temp: 98.9 F (37.2 C)    Body mass index is 39.39 kg/(m^2).  GENERAL: vitals reviewed and listed above, alert, oriented, appears well hydrated and in no acute distress  HEENT: atraumatic, conjunttiva clear, no obvious abnormalities on inspection of external  nose and ears  NECK: no obvious masses on inspection  LUNGS: clear to auscultation bilaterally, no wheezes, rales or rhonchi, good air movement  CV: HRRR, no peripheral edema  SKIN: dry skin throughout  MS: moves all extremities without noticeable abnormality  PSYCH: pleasant and cooperative, no obvious depression or anxiety  ASSESSMENT AND PLAN:  Discussed the following assessment and plan:  Dry skin  Eczema  Bilateral edema of lower extremity  Obesity  Type 2 diabetes mellitus without complication  Hyperlipemia  -discussed good skin care regimen -hypoallergenic regimen -reviewed labs -discussed risks/benefits chronic narcotics for MS pain -advised regular exercise, healthy diet and weight loss -no edema on exam today, compression ,elevation -reviewed labs with him -follow up for  CPE -flu shot today -Patient advised to return or notify a doctor immediately if symptoms worsen or persist or new concerns arise.  Patient Instructions  -please use only hypoallergenic soaps and detergents  -CERAVE cream at least 1-2 times daily to arms, legs and back  -schedule eye exam  -increase exercise to at least 150 minutes per week - work with your back doctor to find activities that you tolerate - stationary bike, walking, water activities and consider working with your back doctor to taper off of you pain medications if they are not helping  -BEFORE YOU LEAVE: schedule follow up with Dr. Artist PaisYoo for your Physical exam in 3-4 months; flu shot     [name], Mark R.

## 2013-11-19 ENCOUNTER — Other Ambulatory Visit: Payer: Self-pay | Admitting: Internal Medicine

## 2013-11-28 ENCOUNTER — Ambulatory Visit: Payer: BC Managed Care – PPO | Admitting: Internal Medicine

## 2013-12-09 ENCOUNTER — Other Ambulatory Visit: Payer: Self-pay | Admitting: Internal Medicine

## 2014-01-14 ENCOUNTER — Ambulatory Visit: Payer: BC Managed Care – PPO | Admitting: Pulmonary Disease

## 2014-01-15 ENCOUNTER — Encounter: Payer: Self-pay | Admitting: Pulmonary Disease

## 2014-01-15 ENCOUNTER — Ambulatory Visit (INDEPENDENT_AMBULATORY_CARE_PROVIDER_SITE_OTHER): Payer: BC Managed Care – PPO | Admitting: Pulmonary Disease

## 2014-01-15 VITALS — BP 142/78 | HR 89 | Temp 98.1°F | Ht 70.0 in | Wt 272.8 lb

## 2014-01-15 DIAGNOSIS — G4733 Obstructive sleep apnea (adult) (pediatric): Secondary | ICD-10-CM

## 2014-01-15 NOTE — Progress Notes (Signed)
   Subjective:    Patient ID: [name], male    DOB: Mar 02, 1970, 43 y.o.   MRN: [id number]  HPI The patient comes in today for follow-up of his obstructive sleep apnea. He is wearing C Pap compliantly by his download, and has great control of his AHI. He is comfortable with his mask fit, and has no significant leak on the download as well. He feels that he is sleeping well with the device, and has adequate daytime alertness.   Review of Systems  Constitutional: Negative for fever and unexpected weight change.  HENT: Negative for congestion, dental problem, ear pain, nosebleeds, postnasal drip, rhinorrhea, sinus pressure, sneezing, sore throat and trouble swallowing.   Eyes: Negative for redness and itching.  Respiratory: Negative for cough, chest tightness, shortness of breath and wheezing.   Cardiovascular: Negative for palpitations and leg swelling.  Gastrointestinal: Negative for nausea and vomiting.  Genitourinary: Negative for dysuria.  Musculoskeletal: Negative for joint swelling.  Skin: Negative for rash.  Neurological: Negative for headaches.  Hematological: Does not bruise/bleed easily.  Psychiatric/Behavioral: Negative for dysphoric mood. The patient is not nervous/anxious.        Objective:   Physical Exam Overweight male in no acute distress Nose without purulence or discharge noted Neck without lymphadenopathy or thyromegaly No skin breakdown or pressure necrosis from the C Pap mask Lower extremities without edema, no cyanosis Alert and oriented, does not appear to be sleepy, moves all 4 extremities.       Assessment & Plan:

## 2014-01-15 NOTE — Patient Instructions (Signed)
Stay on cpap, and keep up with mask changes and supplies. Work on weight loss followup with me again in one year 

## 2014-01-15 NOTE — Assessment & Plan Note (Signed)
The patient continues to do well on C Pap, and his download shows an excellent compliance and good control of his AHI. I've asked him to continue with the device, and to work aggressively on weight loss. I will see him back in one year if he is doing well.

## 2014-02-07 ENCOUNTER — Other Ambulatory Visit (INDEPENDENT_AMBULATORY_CARE_PROVIDER_SITE_OTHER): Payer: BLUE CROSS/BLUE SHIELD

## 2014-02-07 ENCOUNTER — Ambulatory Visit (INDEPENDENT_AMBULATORY_CARE_PROVIDER_SITE_OTHER): Payer: BLUE CROSS/BLUE SHIELD | Admitting: Family Medicine

## 2014-02-07 ENCOUNTER — Encounter: Payer: Self-pay | Admitting: Family Medicine

## 2014-02-07 VITALS — BP 102/82 | HR 79 | Temp 98.0°F | Ht 70.0 in | Wt 267.0 lb

## 2014-02-07 DIAGNOSIS — Z0000 Encounter for general adult medical examination without abnormal findings: Secondary | ICD-10-CM

## 2014-02-07 DIAGNOSIS — J069 Acute upper respiratory infection, unspecified: Secondary | ICD-10-CM

## 2014-02-07 LAB — LIPID PANEL
CHOL/HDL RATIO: 6
Cholesterol: 249 mg/dL — ABNORMAL HIGH (ref 0–200)
HDL: 38.9 mg/dL — AB (ref >39.00)
LDL CALC: 182 mg/dL — AB (ref 0–99)
NONHDL: 210.1
TRIGLYCERIDES: 139 mg/dL (ref 0.0–149.0)
VLDL: 27.8 mg/dL (ref 0.0–40.0)

## 2014-02-07 LAB — POCT URINALYSIS DIPSTICK
Bilirubin, UA: NEGATIVE
Glucose, UA: NEGATIVE
Ketones, UA: NEGATIVE
LEUKOCYTES UA: NEGATIVE
NITRITE UA: NEGATIVE
PROTEIN UA: NEGATIVE
RBC UA: NEGATIVE
Spec Grav, UA: 1.01
Urobilinogen, UA: 0.2
pH, UA: 6.5

## 2014-02-07 LAB — CBC WITH DIFFERENTIAL/PLATELET
BASOS PCT: 0.4 % (ref 0.0–3.0)
Basophils Absolute: 0 10*3/uL (ref 0.0–0.1)
EOS ABS: 0.1 10*3/uL (ref 0.0–0.7)
EOS PCT: 2 % (ref 0.0–5.0)
HEMATOCRIT: 42.5 % (ref 39.0–52.0)
HEMOGLOBIN: 13.6 g/dL (ref 13.0–17.0)
Lymphocytes Relative: 23.4 % (ref 12.0–46.0)
Lymphs Abs: 1.7 10*3/uL (ref 0.7–4.0)
MCHC: 32.1 g/dL (ref 30.0–36.0)
MCV: 76.3 fl — ABNORMAL LOW (ref 78.0–100.0)
Monocytes Absolute: 0.6 10*3/uL (ref 0.1–1.0)
Monocytes Relative: 8.2 % (ref 3.0–12.0)
Neutro Abs: 4.9 10*3/uL (ref 1.4–7.7)
Neutrophils Relative %: 66 % (ref 43.0–77.0)
Platelets: 245 10*3/uL (ref 150.0–400.0)
RBC: 5.56 Mil/uL (ref 4.22–5.81)
RDW: 17.8 % — ABNORMAL HIGH (ref 11.5–15.5)
WBC: 7.4 10*3/uL (ref 4.0–10.5)

## 2014-02-07 LAB — HEPATIC FUNCTION PANEL
ALBUMIN: 3.9 g/dL (ref 3.5–5.2)
ALT: 12 U/L (ref 0–53)
AST: 16 U/L (ref 0–37)
Alkaline Phosphatase: 60 U/L (ref 39–117)
Bilirubin, Direct: 0 mg/dL (ref 0.0–0.3)
Total Bilirubin: 0.5 mg/dL (ref 0.2–1.2)
Total Protein: 7.5 g/dL (ref 6.0–8.3)

## 2014-02-07 LAB — BASIC METABOLIC PANEL
BUN: 7 mg/dL (ref 6–23)
CO2: 25 meq/L (ref 19–32)
Calcium: 9 mg/dL (ref 8.4–10.5)
Chloride: 107 mEq/L (ref 96–112)
Creatinine, Ser: 0.9 mg/dL (ref 0.4–1.5)
GFR: 116.05 mL/min (ref >60.00)
Glucose, Bld: 79 mg/dL (ref 70–99)
Potassium: 3.8 mEq/L (ref 3.5–5.1)
Sodium: 139 mEq/L (ref 135–145)

## 2014-02-07 LAB — MICROALBUMIN / CREATININE URINE RATIO
Creatinine,U: 101.8 mg/dL
Microalb Creat Ratio: 0.9 mg/g (ref 0.0–30.0)
Microalb, Ur: 0.9 mg/dL (ref 0.0–1.9)

## 2014-02-07 LAB — POCT RAPID STREP A (OFFICE): Rapid Strep A Screen: NEGATIVE

## 2014-02-07 LAB — PSA: PSA: 0.5 ng/mL (ref 0.10–4.00)

## 2014-02-07 LAB — TSH: TSH: 2.06 u[IU]/mL (ref 0.35–4.50)

## 2014-02-07 LAB — HEMOGLOBIN A1C: Hgb A1c MFr Bld: 6.6 % — ABNORMAL HIGH (ref 4.6–6.5)

## 2014-02-07 MED ORDER — HYDROCODONE-HOMATROPINE 5-1.5 MG/5ML PO SYRP: 5.0000 mL | ORAL_SOLUTION | Freq: Three times a day (TID) | ORAL | Status: DC | PRN

## 2014-02-07 NOTE — Patient Instructions (Signed)
INSTRUCTIONS FOR UPPER RESPIRATORY INFECTION:  -plenty of rest and fluids  -do not use the couhg medication and drive or use this with your pain medication (vicodin)  -nasal saline wash 2-3 times daily (use prepackaged nasal saline or bottled/distilled water if making your own)   -can use AFRIN nasal spray for drainage and nasal congestion - but do NOT use longer then 3-4 days  -can use tylenol or ibuprofen as directed for aches and sorethroat  -in the winter time, using a humidifier at night is helpful (please follow cleaning instructions)  -if you are taking a cough medication - use only as directed, may also try a teaspoon of honey to coat the throat and throat lozenges  -for sore throat, salt water gargles can help  -follow up if you have fevers, facial pain, tooth pain, difficulty breathing or are worsening or not improving as expected

## 2014-02-07 NOTE — Progress Notes (Signed)
HPI:  URI: -started: 2-3 days ago -symptoms:nasal congestion, sore throat, cough, R ear> L ear pain -denies:fever, SOB, NVD, tooth pain -has tried: cold and flu OTC medication -sick contacts/travel/risks: denies flu exposure, tick exposure or or Ebola risks - family members with cold -Hx of: allergies  -denies: any use of pain medicaiton in weeks  ROS: See pertinent positives and negatives per HPI.  Past Medical History  Diagnosis Date  . Allergy   . Hyperlipidemia   . Gout   . Seizure disorder   . H/O: CVA (cardiovascular accident)   . LBP (low back pain)   . Knee pain, bilateral   . Plantar fasciitis     followed by Dr Harriet Pho    Past Surgical History  Procedure Laterality Date  . Right shoulder surgery      x2  . Left heel surgery      x2    Family History  Problem Relation Age of Onset  . Diabetes Father   . Hypertension Father   . Heart attack Maternal Grandfather   . Cancer Maternal Uncle   . Allergies Daughter     History   Social History  . Marital Status: Married    Spouse Name: N/A    Number of Children: 2  . Years of Education: N/A   Occupational History  . Production designer, theatre/television/film and clergy    Social History Main Topics  . Smoking status: Former Smoker -- 4 years    Types: Cigarettes    Quit date: 12/22/1990  . Smokeless tobacco: None     Comment: 1-2 packs a week  . Alcohol Use: No  . Drug Use: No  . Sexual Activity: None   Other Topics Concern  . None   Social History Narrative    Current outpatient prescriptions: cyclobenzaprine (FLEXERIL) 10 MG tablet, Take 1 tablet (10 mg total) by mouth as needed., Disp: 30 tablet, Rfl: 1;  diclofenac sodium (VOLTAREN) 1 % GEL, Apply 1 application topically 4 (four) times daily., Disp: , Rfl: ;  escitalopram (LEXAPRO) 20 MG tablet, Once daily, Disp: , Rfl: 0 HYDROcodone-acetaminophen (VICODIN) 5-500 MG per tablet, Take 1 tablet by mouth every 8 (eight) hours as needed for pain., Disp: 60 tablet, Rfl:  3;  lidocaine (XYLOCAINE) 5 % ointment, Apply topically daily., Disp: , Rfl: ;  losartan (COZAAR) 25 MG tablet, TAKE 1 TABLET BY MOUTH EVERY DAY, Disp: 90 tablet, Rfl: 0;  metFORMIN (GLUCOPHAGE) 500 MG tablet, TAKE 2 TABLETS BY MOUTH TWICE A DAY WITH A MEAL, Disp: 180 tablet, Rfl: 1 HYDROcodone-homatropine (HYCODAN) 5-1.5 MG/5ML syrup, Take 5 mLs by mouth every 8 (eight) hours as needed for cough., Disp: 120 mL, Rfl: 0  EXAM:  Filed Vitals:   02/07/14 0900  BP: 102/82  Pulse: 79  Temp: 98 F (36.7 C)    Body mass index is 38.31 kg/(m^2).  GENERAL: vitals reviewed and listed above, alert, oriented, appears well hydrated and in no acute distress  HEENT: atraumatic, conjunttiva clear, no obvious abnormalities on inspection of external nose and ears, normal appearance of ear canals and TMs, clear nasal congestion, mild post oropharyngeal erythema with PND, no tonsillar edema or exudate, no sinus TTP  NECK: no obvious masses on inspection  LUNGS: clear to auscultation bilaterally, no wheezes, rales or rhonchi, good air movement  CV: HRRR, no peripheral edema  MS: moves all extremities without noticeable abnormality  PSYCH: pleasant and cooperative, no obvious depression or anxiety  ASSESSMENT AND PLAN:  Discussed the  following assessment and plan:  Acute upper respiratory infection - Plan: HYDROcodone-homatropine (HYCODAN) 5-1.5 MG/5ML syrup  -given HPI and exam findings today, a serious infection or illness is unlikely. We discussed potential etiologies, with VURI being most likely, and advised supportive care and monitoring. We discussed treatment side effects, likely course, antibiotic misuse, transmission, and signs of developing a serious illness. -of course, we advised to return or notify a doctor immediately if symptoms worsen or persist or new concerns arise.    Patient Instructions  INSTRUCTIONS FOR UPPER RESPIRATORY INFECTION:  -plenty of rest and fluids  -do not use  the couhg medication and drive or use this with your pain medication (vicodin)  -nasal saline wash 2-3 times daily (use prepackaged nasal saline or bottled/distilled water if making your own)   -can use AFRIN nasal spray for drainage and nasal congestion - but do NOT use longer then 3-4 days  -can use tylenol or ibuprofen as directed for aches and sorethroat  -in the winter time, using a humidifier at night is helpful (please follow cleaning instructions)  -if you are taking a cough medication - use only as directed, may also try a teaspoon of honey to coat the throat and throat lozenges  -for sore throat, salt water gargles can help  -follow up if you have fevers, facial pain, tooth pain, difficulty breathing or are worsening or not improving as expected      Addie Alonge R.

## 2014-02-07 NOTE — Progress Notes (Signed)
Pre visit review using our clinic review tool, if applicable. No additional management support is needed unless otherwise documented below in the visit note. 

## 2014-02-07 NOTE — Addendum Note (Signed)
Addended by: Johnella MoloneyFUNDERBURK, JO A on: 02/07/2014 09:25 AM   Modules accepted: Orders

## 2014-02-09 LAB — CULTURE, GROUP A STREP: ORGANISM ID, BACTERIA: NORMAL

## 2014-02-11 LAB — HM DIABETES EYE EXAM

## 2014-02-14 ENCOUNTER — Encounter: Payer: BC Managed Care – PPO | Admitting: Internal Medicine

## 2014-02-20 ENCOUNTER — Encounter: Payer: Self-pay | Admitting: Internal Medicine

## 2014-03-03 ENCOUNTER — Other Ambulatory Visit: Payer: Self-pay | Admitting: Internal Medicine

## 2014-03-12 ENCOUNTER — Telehealth: Payer: Self-pay

## 2014-03-12 MED ORDER — LOSARTAN POTASSIUM 25 MG PO TABS: 25.0000 mg | ORAL_TABLET | Freq: Every day | ORAL | Status: DC

## 2014-03-12 NOTE — Telephone Encounter (Signed)
[facility] refill request for LOSARTAN 25MG  TAB

## 2014-03-12 NOTE — Telephone Encounter (Signed)
rx sent in electronically 

## 2014-03-19 ENCOUNTER — Encounter: Payer: Self-pay | Admitting: Internal Medicine

## 2014-03-19 ENCOUNTER — Ambulatory Visit (INDEPENDENT_AMBULATORY_CARE_PROVIDER_SITE_OTHER): Payer: BLUE CROSS/BLUE SHIELD | Admitting: Internal Medicine

## 2014-03-19 VITALS — BP 120/90 | HR 87 | Temp 98.0°F | Ht 69.0 in | Wt 262.0 lb

## 2014-03-19 DIAGNOSIS — Z0000 Encounter for general adult medical examination without abnormal findings: Secondary | ICD-10-CM

## 2014-03-19 MED ORDER — ROSUVASTATIN CALCIUM 20 MG PO TABS: 20.0000 mg | ORAL_TABLET | Freq: Every day | ORAL | Status: DC

## 2014-03-19 MED ORDER — LOSARTAN POTASSIUM 25 MG PO TABS: 25.0000 mg | ORAL_TABLET | Freq: Every day | ORAL | Status: DC

## 2014-03-19 NOTE — Progress Notes (Signed)
Subjective:    Patient ID: [name], male    DOB: [birth year], 44 y.o.   MRN: [id number]  HPI  44 year old African American male with history of mild type 2 diabetes, hyperlipidemia and chronic low back pain for routine physical. Patient denies any significant interval medical history.  Pertinent social and family history reviewed and updated.  He ran out of Crestor.  LDL elevated.  Review of Systems  Constitutional: Negative for activity change, appetite change and unexpected weight change.  Eyes: Negative for visual disturbance.  Respiratory: Negative for cough, chest tightness and shortness of breath.   Cardiovascular: Negative for chest pain.  Genitourinary: Negative for difficulty urinating. urinary frequency. Denies weak urine stream Neurological: Negative for headaches.  Gastrointestinal: Negative for abdominal pain, heartburn melena or hematochezia Psych: Negative for depression or anxiety Endo:  No polyuria or polydypsia        Past Medical History  Diagnosis Date  . Allergy   . Hyperlipidemia   . Gout   . Seizure disorder   . H/O: CVA (cardiovascular accident)   . LBP (low back pain)   . Knee pain, bilateral   . Plantar fasciitis     followed by Dr Harriet Pho    History   Social History  . Marital Status: Married    Spouse Name: N/A  . Number of Children: 2  . Years of Education: N/A   Occupational History  . Production designer, theatre/television/film and clergy    Social History Main Topics  . Smoking status: Former Smoker -- 4 years    Types: Cigarettes    Quit date: 12/22/1990  . Smokeless tobacco: Not on file     Comment: 1-2 packs a week  . Alcohol Use: No  . Drug Use: No  . Sexual Activity: Not on file   Other Topics Concern  . Not on file   Social History Narrative   Works for [employer]    Past Surgical History  Procedure Laterality Date  . Right shoulder surgery      x2  . Left heel surgery      x2    Family History  Problem Relation Age  of Onset  . Diabetes Father   . Hypertension Father   . Heart attack Maternal Grandfather   . Cancer Maternal Uncle   . Allergies Daughter     Allergies  Allergen Reactions  . Atorvastatin   . Cymbalta [Duloxetine Hcl]     Stomach upset, difficulty urinating, decreased libido    Current Outpatient Prescriptions on File Prior to Visit  Medication Sig Dispense Refill  . cyclobenzaprine (FLEXERIL) 10 MG tablet Take 1 tablet (10 mg total) by mouth as needed. 30 tablet 1  . diclofenac sodium (VOLTAREN) 1 % GEL Apply 1 application topically 4 (four) times daily.    Marland Kitchen escitalopram (LEXAPRO) 20 MG tablet Once daily  0  . HYDROcodone-acetaminophen (VICODIN) 5-500 MG per tablet Take 1 tablet by mouth every 8 (eight) hours as needed for pain. 60 tablet 3  . HYDROcodone-homatropine (HYCODAN) 5-1.5 MG/5ML syrup Take 5 mLs by mouth every 8 (eight) hours as needed for cough. 120 mL 0  . lidocaine (XYLOCAINE) 5 % ointment Apply topically daily.    . metFORMIN (GLUCOPHAGE) 500 MG tablet TAKE 2 TABLETS BY MOUTH TWICE A DAY WITH A MEAL 180 tablet 0   No current facility-administered medications on file prior to visit.    BP 120/90 mmHg  Pulse 87  Temp(Src) 98 F (36.7 C) (  Oral)  Ht 5\' 9"  (1.753 m)  Wt 262 lb (118.842 kg)  BMI 38.67 kg/m2     Objective:   Physical Exam   Constitutional: Appears well-developed and well-nourished. No distress.  Head: Normocephalic and atraumatic.  Ear:  Right and left ear normal.  TMs clear.  Hearing is grossly normal Mouth/Throat: Oropharynx is clear and moist.  Eyes: Conjunctivae are normal. Pupils are equal, round, and reactive to light.  Neck: Normal range of motion. Neck supple. No thyromegaly present. No carotid bruit Cardiovascular: Normal rate, regular rhythm and normal heart sounds.  Exam reveals no gallop and no friction rub.  No murmur heard. Pulmonary/Chest: Effort normal and breath sounds normal.  No wheezes. No rales.  Abdominal: Soft.  Bowel sounds are normal. No mass. There is no tenderness.  GU: no hernias, no testicular mass, prostate without nodules or asymmetry. Guaiac negative Neurological: Alert. No cranial nerve deficit.  Skin: Skin is warm and dry.  Psychiatric: Normal mood and affect. Behavior is normal.        Assessment & Plan:

## 2014-03-19 NOTE — Assessment & Plan Note (Signed)
Reviewed adult health maintenance protocols.  Patient up to date with vaccines.  Weight loss encouraged.  We discussed pros and cons of prostate cancer screening patient agrees to proceed with screening. PSA normal. Digital rectal exam unremarkable. Colon cancer screening starting at age 45.  Patient advised to restart Crestor and takes statin medication on a regular basis.

## 2014-03-19 NOTE — Patient Instructions (Signed)
Please complete the following lab tests before your next follow up appointment: BMET, A1c - 250.00 FLP, LFTs - 272.4 

## 2014-03-19 NOTE — Progress Notes (Signed)
Pre visit review using our clinic review tool, if applicable. No additional management support is needed unless otherwise documented below in the visit note. 

## 2014-05-01 ENCOUNTER — Encounter: Payer: Self-pay | Admitting: Family Medicine

## 2014-05-01 ENCOUNTER — Ambulatory Visit (INDEPENDENT_AMBULATORY_CARE_PROVIDER_SITE_OTHER): Payer: BLUE CROSS/BLUE SHIELD | Admitting: Family Medicine

## 2014-05-01 VITALS — BP 120/74 | HR 89 | Temp 98.3°F | Wt 261.0 lb

## 2014-05-01 DIAGNOSIS — R1011 Right upper quadrant pain: Secondary | ICD-10-CM | POA: Diagnosis not present

## 2014-05-01 NOTE — Progress Notes (Signed)
Pre visit review using our clinic review tool, if applicable. No additional management support is needed unless otherwise documented below in the visit note. 

## 2014-05-01 NOTE — Patient Instructions (Signed)
We will schedule abdominal ultrasound and call you with appointment. Avoid fatty/fried foods until further evaluation.

## 2014-05-01 NOTE — Progress Notes (Signed)
   Subjective:    Patient ID: [name], male    DOB: 07/31/1970, 44 y.o.   MRN: [id number]  HPI   Patient seen with about 3 week history of intermittent abdominal pain. Epigastric and right upper quadrant. He has had some gradual weight loss in recent months but he states this is due to his effort. Appetite is good.  He had some intermittent nausea and one episode of vomiting this morning. No hematemesis. No melena. No consistent nonsteroidal use. No alcohol use. Pain is sometimes sharp and sometimes dull.  Symptoms last sometimes up to couple of hours. He has occasional constipation and occasional diarrhea. No bloody stools. Had recent physical and labs were unremarkable except for elevated lipids. Current symptoms are worse after things like fried fish.  Past Medical History  Diagnosis Date  . Allergy   . Hyperlipidemia   . Gout   . Seizure disorder   . H/O: CVA (cardiovascular accident)   . LBP (low back pain)   . Knee pain, bilateral   . Plantar fasciitis     followed by Dr Harriet PhoAjlouny   Past Surgical History  Procedure Laterality Date  . Right shoulder surgery      x2  . Left heel surgery      x2    reports that he quit smoking about 23 years ago. His smoking use included Cigarettes. He quit after 4 years of use. He does not have any smokeless tobacco history on file. He reports that he does not drink alcohol or use illicit drugs. family history includes Allergies in his daughter; Cancer in his maternal uncle; Diabetes in his father; Heart attack in his maternal grandfather; Hypertension in his father. Allergies  Allergen Reactions  . Atorvastatin   . Cymbalta [Duloxetine Hcl]     Stomach upset, difficulty urinating, decreased libido      Review of Systems  Constitutional: Negative for fever, chills and appetite change.  Respiratory: Negative for cough and shortness of breath.   Cardiovascular: Negative for chest pain.  Gastrointestinal: Positive for nausea,  vomiting, abdominal pain, diarrhea and constipation.  Neurological: Negative for dizziness.       Objective:   Physical Exam  Constitutional: He appears well-developed and well-nourished.  Neck: Neck supple.  Cardiovascular: Normal rate and regular rhythm.   Pulmonary/Chest: Effort normal and breath sounds normal. No respiratory distress. He has no wheezes. He has no rales.  Abdominal: Soft. Bowel sounds are normal. He exhibits no distension and no mass. There is no rebound and no guarding.  Minimally tender right upper quadrant deep palpation          Assessment & Plan:  Three-week history of intermittent epigastric and right upper quadrant abdominal pain. Rule out gallstones. Check abdominal ultrasound. Recent lab work unremarkable. Bland diet until further evaluated.  Follow up immediately for fever or worsening symptoms.

## 2014-05-06 ENCOUNTER — Encounter (HOSPITAL_COMMUNITY): Payer: Self-pay | Admitting: Emergency Medicine

## 2014-05-06 ENCOUNTER — Ambulatory Visit
Admission: RE | Admit: 2014-05-06 | Discharge: 2014-05-06 | Disposition: A | Discharge status: Home / Self Care | Payer: BLUE CROSS/BLUE SHIELD | Source: Ambulatory Visit | Attending: Family Medicine | Admitting: Family Medicine

## 2014-05-06 ENCOUNTER — Emergency Department (HOSPITAL_COMMUNITY): Payer: BLUE CROSS/BLUE SHIELD

## 2014-05-06 ENCOUNTER — Emergency Department (HOSPITAL_COMMUNITY)
Admission: EM | Admit: 2014-05-06 | Discharge: 2014-05-06 | Disposition: A | Discharge status: Home / Self Care | Payer: BLUE CROSS/BLUE SHIELD | Attending: Emergency Medicine | Admitting: Emergency Medicine

## 2014-05-06 DIAGNOSIS — Z8673 Personal history of transient ischemic attack (TIA), and cerebral infarction without residual deficits: Secondary | ICD-10-CM | POA: Insufficient documentation

## 2014-05-06 DIAGNOSIS — R1901 Right upper quadrant abdominal swelling, mass and lump: Secondary | ICD-10-CM

## 2014-05-06 DIAGNOSIS — E785 Hyperlipidemia, unspecified: Secondary | ICD-10-CM | POA: Diagnosis not present

## 2014-05-06 DIAGNOSIS — Z87891 Personal history of nicotine dependence: Secondary | ICD-10-CM | POA: Diagnosis not present

## 2014-05-06 DIAGNOSIS — Z8669 Personal history of other diseases of the nervous system and sense organs: Secondary | ICD-10-CM | POA: Diagnosis not present

## 2014-05-06 DIAGNOSIS — R197 Diarrhea, unspecified: Secondary | ICD-10-CM | POA: Diagnosis not present

## 2014-05-06 DIAGNOSIS — Z79899 Other long term (current) drug therapy: Secondary | ICD-10-CM | POA: Insufficient documentation

## 2014-05-06 DIAGNOSIS — Z791 Long term (current) use of non-steroidal anti-inflammatories (NSAID): Secondary | ICD-10-CM | POA: Diagnosis not present

## 2014-05-06 DIAGNOSIS — R1011 Right upper quadrant pain: Secondary | ICD-10-CM

## 2014-05-06 DIAGNOSIS — K5900 Constipation, unspecified: Secondary | ICD-10-CM | POA: Diagnosis not present

## 2014-05-06 DIAGNOSIS — R112 Nausea with vomiting, unspecified: Secondary | ICD-10-CM | POA: Insufficient documentation

## 2014-05-06 DIAGNOSIS — M109 Gout, unspecified: Secondary | ICD-10-CM | POA: Diagnosis not present

## 2014-05-06 LAB — URINALYSIS, ROUTINE W REFLEX MICROSCOPIC
Bilirubin Urine: NEGATIVE
Glucose, UA: NEGATIVE mg/dL
HGB URINE DIPSTICK: NEGATIVE
Ketones, ur: NEGATIVE mg/dL
LEUKOCYTES UA: NEGATIVE
Nitrite: NEGATIVE
PROTEIN: NEGATIVE mg/dL
SPECIFIC GRAVITY, URINE: 1.009 (ref 1.005–1.030)
Urobilinogen, UA: 0.2 mg/dL (ref 0.0–1.0)
pH: 6 (ref 5.0–8.0)

## 2014-05-06 LAB — COMPREHENSIVE METABOLIC PANEL
ALK PHOS: 66 U/L (ref 39–117)
ALT: 16 U/L (ref 0–53)
AST: 21 U/L (ref 0–37)
Albumin: 4.3 g/dL (ref 3.5–5.2)
Anion gap: 9 (ref 5–15)
BILIRUBIN TOTAL: 0.1 mg/dL — AB (ref 0.3–1.2)
BUN: 15 mg/dL (ref 6–23)
CHLORIDE: 104 mmol/L (ref 96–112)
CO2: 26 mmol/L (ref 19–32)
Calcium: 9.4 mg/dL (ref 8.4–10.5)
Creatinine, Ser: 0.88 mg/dL (ref 0.50–1.35)
GFR calc Af Amer: >90 mL/min (ref >90)
GFR calc non Af Amer: >90 mL/min (ref >90)
Glucose, Bld: 86 mg/dL (ref 70–99)
POTASSIUM: 4 mmol/L (ref 3.5–5.1)
Sodium: 139 mmol/L (ref 135–145)
Total Protein: 7.8 g/dL (ref 6.0–8.3)

## 2014-05-06 LAB — CBC WITH DIFFERENTIAL/PLATELET
Basophils Absolute: 0 10*3/uL (ref 0.0–0.1)
Basophils Relative: 0 % (ref 0–1)
EOS PCT: 22 % — AB (ref 0–5)
Eosinophils Absolute: 2 10*3/uL — ABNORMAL HIGH (ref 0.0–0.7)
HEMATOCRIT: 43.9 % (ref 39.0–52.0)
HEMOGLOBIN: 14.3 g/dL (ref 13.0–17.0)
LYMPHS ABS: 3 10*3/uL (ref 0.7–4.0)
Lymphocytes Relative: 33 % (ref 12–46)
MCH: 25 pg — ABNORMAL LOW (ref 26.0–34.0)
MCHC: 32.6 g/dL (ref 30.0–36.0)
MCV: 76.7 fL — AB (ref 78.0–100.0)
MONOS PCT: 6 % (ref 3–12)
Monocytes Absolute: 0.6 10*3/uL (ref 0.1–1.0)
Neutro Abs: 3.5 10*3/uL (ref 1.7–7.7)
Neutrophils Relative %: 39 % — ABNORMAL LOW (ref 43–77)
Platelets: 249 10*3/uL (ref 150–400)
RBC: 5.72 MIL/uL (ref 4.22–5.81)
RDW: 15.7 % — ABNORMAL HIGH (ref 11.5–15.5)
WBC: 9 10*3/uL (ref 4.0–10.5)

## 2014-05-06 LAB — LIPASE, BLOOD: Lipase: 26 U/L (ref 11–59)

## 2014-05-06 MED ORDER — PANTOPRAZOLE SODIUM 40 MG IV SOLR
40.0000 mg | Freq: Once | INTRAVENOUS | Status: AC
Start: 2014-05-06 — End: 2014-05-06
  Administered 2014-05-06: 40 mg via INTRAVENOUS
  Filled 2014-05-06: qty 40

## 2014-05-06 MED ORDER — METOCLOPRAMIDE HCL 5 MG/ML IJ SOLN
10.0000 mg | Freq: Once | INTRAMUSCULAR | Status: AC
  Administered 2014-05-06: 10 mg via INTRAVENOUS
  Filled 2014-05-06: qty 2

## 2014-05-06 MED ORDER — IOHEXOL 300 MG/ML  SOLN
100.0000 mL | Freq: Once | INTRAMUSCULAR | Status: AC | PRN
  Administered 2014-05-06: 100 mL via INTRAVENOUS

## 2014-05-06 MED ORDER — SODIUM CHLORIDE 0.9 % IV BOLUS (SEPSIS)
1000.0000 mL | Freq: Once | INTRAVENOUS | Status: AC
  Administered 2014-05-06: 1000 mL via INTRAVENOUS

## 2014-05-06 MED ORDER — RANITIDINE HCL 150 MG PO CAPS: 150.0000 mg | ORAL_CAPSULE | Freq: Every day | ORAL | Status: DC

## 2014-05-06 MED ORDER — ONDANSETRON HCL 4 MG/2ML IJ SOLN
4.0000 mg | Freq: Once | INTRAMUSCULAR | Status: AC
  Administered 2014-05-06: 4 mg via INTRAVENOUS
  Filled 2014-05-06: qty 2

## 2014-05-06 MED ORDER — MORPHINE SULFATE 4 MG/ML IJ SOLN
4.0000 mg | Freq: Once | INTRAMUSCULAR | Status: AC
  Administered 2014-05-06: 4 mg via INTRAVENOUS
  Filled 2014-05-06: qty 1

## 2014-05-06 MED ORDER — ONDANSETRON HCL 4 MG PO TABS: 4.0000 mg | ORAL_TABLET | Freq: Four times a day (QID) | ORAL | Status: DC

## 2014-05-06 NOTE — ED Notes (Signed)
Pt reports for the past 3 weeks he has had upper right abdominal pain with vomiting and diarrhea. Pt reports he is lethargic and dizzy. Pt states his vomit looks like bowel and is unable to keep anything down.

## 2014-05-06 NOTE — ED Notes (Signed)
Writer provided a cup of sprite and gram crackers for PO/Fluid challenge

## 2014-05-06 NOTE — Discharge Instructions (Signed)
Return to the emergency room with worsening of symptoms, new symptoms or with symptoms that are concerning , especially fevers, abdominal pain in one area, unable to keep down fluids, blood in stool or vomit, severe pain, you feel faint, lightheaded or pass out. Please call your doctor for a followup appointment within 24-48 hours. When you talk to your doctor please let them know that you were seen in the emergency department and have them acquire all of your records so that they can discuss the findings with you and formulate a treatment plan to fully care for your new and ongoing problems.  Make sure to drink plenty of fluids. BRAT diet: bananas, rice, applesauce, toast Call to make appointment as soon as possible with gastroenterology. Read below information and follow recommendations.  Abdominal Pain Many things can cause abdominal pain. Usually, abdominal pain is not caused by a disease and will improve without treatment. It can often be observed and treated at home. Your health care provider will do a physical exam and possibly order blood tests and X-rays to help determine the seriousness of your pain. However, in many cases, more time must pass before a clear cause of the pain can be found. Before that point, your health care provider may not know if you need more testing or further treatment. HOME CARE INSTRUCTIONS  Monitor your abdominal pain for any changes. The following actions may help to alleviate any discomfort you are experiencing:  Only take over-the-counter or prescription medicines as directed by your health care provider.  Do not take laxatives unless directed to do so by your health care provider.  Try a clear liquid diet (broth, tea, or water) as directed by your health care provider. Slowly move to a bland diet as tolerated. SEEK MEDICAL CARE IF:  You have unexplained abdominal pain.  You have abdominal pain associated with nausea or diarrhea.  You have pain when you  urinate or have a bowel movement.  You experience abdominal pain that wakes you in the night.  You have abdominal pain that is worsened or improved by eating food.  You have abdominal pain that is worsened with eating fatty foods.  You have a fever. SEEK IMMEDIATE MEDICAL CARE IF:   Your pain does not go away within 2 hours.  You keep throwing up (vomiting).  Your pain is felt only in portions of the abdomen, such as the right side or the left lower portion of the abdomen.  You pass bloody or black tarry stools. MAKE SURE YOU:  Understand these instructions.   Will watch your condition.   Will get help right away if you are not doing well or get worse.  Document Released: 10/27/2004 Document Revised: 01/22/2013 Document Reviewed: 09/26/2012 [facility] Patient Information 2015 [place], [place]. This information is not intended to replace advice given to you by your health care provider. Make sure you discuss any questions you have with your health care provider.

## 2014-05-06 NOTE — ED Notes (Signed)
Pt ambulating independently w/ steady gait on d/c in no acute distress, A&Ox4. D/c instructions reviewed w/ pt and family - pt and family deny any further questions or concerns at present. Rx given x2  

## 2014-05-06 NOTE — ED Notes (Signed)
Pt reports n/v x3 weeks w/ associated RUQ pain, pt has been to his PCP for this and was ordered to have an US which was done today. Pt unable to tolerate PO intake, denies any known fevers - pt admits to some dizziness today. Pt A&Ox4 - no acute distress, spouse at bedside.

## 2014-05-06 NOTE — ED Provider Notes (Signed)
CSN: [id number]     Arrival date & time 05/06/14  2015 History   First MD Initiated Contact with Patient 05/06/14 2043     Chief Complaint  Patient presents with  . Abdominal Pain     (Consider location/radiation/quality/duration/timing/severity/associated sxs/prior Treatment) HPI  Vicenta AlyKeifer R Tavenner is a 44 y.o. male  presenting with 3 weeks of sharp and stabbing right upper quadrant abdominal pain that is worse after eating and associated with nausea and vomiting is nonbloody nonbilious. Patient also reports intermittent constipation and diarrhea. No blood or melanotic stool. Recent antibiotic use or travel. No known fevers or chills. No history of abdominal surgeries. Patient reports decreased intake as well as some lightheadedness with standing. Patient reports vomiting one to 3 times daily.   Past Medical History  Diagnosis Date  . Allergy   . Hyperlipidemia   . Gout   . Seizure disorder   . H/O: CVA (cardiovascular accident)   . LBP (low back pain)   . Knee pain, bilateral   . Plantar fasciitis     followed by Dr Harriet PhoAjlouny   Past Surgical History  Procedure Laterality Date  . Right shoulder surgery      x2  . Left heel surgery      x2   Family History  Problem Relation Age of Onset  . Diabetes Father   . Hypertension Father   . Heart attack Maternal Grandfather   . Cancer Maternal Uncle   . Allergies Daughter    History  Substance Use Topics  . Smoking status: Former Smoker -- 4 years    Types: Cigarettes    Quit date: 12/22/1990  . Smokeless tobacco: Not on file     Comment: 1-2 packs a week  . Alcohol Use: No    Review of Systems 10 Systems reviewed and are negative for acute change except as noted in the HPI.    Allergies  Atorvastatin and Cymbalta  Home Medications   Prior to Admission medications   Medication Sig Start Date End Date Taking? Authorizing Provider  cyclobenzaprine (FLEXERIL) 10 MG tablet Take 1 tablet (10 mg total) by mouth as  needed. 07/26/12  Yes Doe-Hyun Sherran Needs Yoo, DO  diclofenac sodium (VOLTAREN) 1 % GEL Apply 1 application topically 4 (four) times daily.   Yes Historical Provider, MD  escitalopram (LEXAPRO) 20 MG tablet Take 20 mg by mouth daily. Once daily 01/07/14  Yes Historical Provider, MD  lidocaine (XYLOCAINE) 5 % ointment Apply topically daily.   Yes Historical Provider, MD  losartan (COZAAR) 25 MG tablet Take 1 tablet (25 mg total) by mouth daily. 03/19/14  Yes Doe-Hyun R Artist PaisYoo, DO  metFORMIN (GLUCOPHAGE) 500 MG tablet TAKE 2 TABLETS BY MOUTH TWICE A DAY WITH A MEAL 03/03/14  Yes Doe-Hyun Sherran Needs Yoo, DO  methocarbamol (ROBAXIN) 500 MG tablet Take 500 mg by mouth every 6 (six) hours as needed for muscle spasms.   Yes Historical Provider, MD  rosuvastatin (CRESTOR) 20 MG tablet Take 1 tablet (20 mg total) by mouth daily. 03/19/14  Yes Doe-Hyun R Artist PaisYoo, DO  HYDROcodone-acetaminophen (VICODIN) 5-500 MG per tablet Take 1 tablet by mouth every 8 (eight) hours as needed for pain. Patient not taking: Reported on 05/06/2014 04/13/12   Doe-Hyun R Artist PaisYoo, DO  ondansetron (ZOFRAN) 4 MG tablet Take 1 tablet (4 mg total) by mouth every 6 (six) hours. 05/06/14   Oswaldo ConroyVictoria Rice Walsh, PA-C  ranitidine (ZANTAC) 150 MG capsule Take 1 capsule (150 mg total) by mouth daily. 05/06/14  Oswaldo Conroy, PA-C   BP 113/69 mmHg  Pulse 83  Temp(Src) 98.6 F (37 C) (Oral)  Resp 18  SpO2 97% Physical Exam  Constitutional: He appears well-developed and well-nourished. No distress.  HENT:  Head: Normocephalic and atraumatic.  Mouth/Throat: Oropharynx is clear and moist.  Eyes: Conjunctivae and EOM are normal. Right eye exhibits no discharge. Left eye exhibits no discharge.  Cardiovascular: Normal rate and regular rhythm.   Pulmonary/Chest: Effort normal and breath sounds normal. No respiratory distress. He has no wheezes.  Abdominal: Soft. Bowel sounds are normal. He exhibits no distension.  Moderate RUQ abdominal tenderness without rebound, rigidity,  guarding. No CVA tenderness.  Negative murphy's sign.  Neurological: He is alert. He exhibits normal muscle tone. Coordination normal.  Skin: Skin is warm and dry. He is not diaphoretic.  Nursing note and vitals reviewed.   ED Course  Procedures (including critical care time) Labs Review Labs Reviewed  CBC WITH DIFFERENTIAL/PLATELET - Abnormal; Notable for the following:    MCV 76.7 (*)    MCH 25.0 (*)    RDW 15.7 (*)    Neutrophils Relative % 39 (*)    Eosinophils Relative 22 (*)    Eosinophils Absolute 2.0 (*)    All other components within normal limits  COMPREHENSIVE METABOLIC PANEL - Abnormal; Notable for the following:    Total Bilirubin 0.1 (*)    All other components within normal limits  LIPASE, BLOOD  URINALYSIS, ROUTINE W REFLEX MICROSCOPIC    Imaging Review US Abdomen Complete  05/06/2014   CLINICAL DATA:  Abdominal pain.  EXAM: ULTRASOUND ABDOMEN COMPLETE  COMPARISON:  CT 09/13/2008.  FINDINGS: Gallbladder: No gallstones or wall thickening visualized. No sonographic Murphy sign noted.  Common bile duct: Diameter: 3.4 mm  Liver: No focal lesion identified. Within normal limits in parenchymal echogenicity.  IVC: No abnormality visualized.  Pancreas: Visualized portion unremarkable.  Spleen: Size and appearance within normal limits.  Right Kidney: Length: 11.0 cm. Echogenicity within normal limits. No hydronephrosis visualized. A 1.8 x 1.7 x 2.1 cm rounded solid appearing mass is noted in the right upper quadrant adjacent to the kidney. This could represent adrenal lesion. Gout enhanced MRI of the abdomen suggested to further evaluate.  Left Kidney: Length: 11.8 cm. Echogenicity within normal limits. No mass or hydronephrosis visualized.  Abdominal aorta: No aneurysm visualized.  Other findings: None.  IMPRESSION: 1.8 x 1.7 x 2.1 cm rounded solid nodular lesion noted in the right upper abdomen adjacent to the kidney. This may represent an adrenal lesion including adrenal adenoma.  Gadolinium-enhanced MRI of the abdomen is suggested to further evaluate. Exam is otherwise unremarkable.   Electronically Signed   By: Maisie Fus  Register   On: 05/06/2014 09:48   Ct Abdomen Pelvis W Contrast  05/06/2014   CLINICAL DATA:  Nausea and vomiting with right upper quadrant pain for 3 weeks. Dizziness today. Ultrasound demonstrated suprarenal mass in the right upper quadrant.  EXAM: CT ABDOMEN AND PELVIS WITH CONTRAST  TECHNIQUE: Multidetector CT imaging of the abdomen and pelvis was performed using the standard protocol following bolus administration of intravenous contrast.  CONTRAST:  OMNIPAQUE IOHEXOL 300 MG/ML  SOLN  COMPARISON:  Ultrasound abdomen 05/06/2014  FINDINGS: Atelectasis in the lung bases.  Right adrenal gland nodule corresponding to lesion demonstrated on ultrasound. Nodule measures 2 cm diameter. Characterization is limited without noncontrast imaging. Washout is calculated at 29% which is indeterminate. Based on size and homogeneous appearance, follow-up in 12 months with noncontrast CT  or MR is suggested. This recommendation follows ACR consensus guidelines: Managing Incidental Findings on Abdominal CT: White Paper of the ACR Incidental Findings Committee. J Am Coll Radiol 2010;7:754-773  The liver, spleen, gallbladder, pancreas, kidneys, abdominal aorta, inferior vena cava, and retroperitoneal lymph nodes are unremarkable. Stomach, small bowel, and colon appear normal for degree of distention. No free air or free fluid in the abdomen. Small amount of fat in the umbilicus.  Pelvis: Prostate gland is not enlarged. Bladder wall is not thickened. Appendix is normal. No free or loculated pelvic fluid collections. No pelvic mass or lymphadenopathy. No destructive bone lesions.  IMPRESSION: 2 cm diameter right adrenal gland nodule. Suggest follow-up in 12 months with noncontrast CT or MRI.   Electronically Signed   By: Burman Nieves M.D.   On: 05/06/2014 23:18     EKG  Interpretation None      MDM   Final diagnoses:  RUQ abdominal mass  Non-intractable vomiting with nausea, vomiting of unspecified type  RUQ abdominal pain   Patient presenting with right upper quadrant abdominal pain for last 3 weeks associated nausea and vomiting so changes.  Patient with ultrasound performed today with evidence of right nodular lesion in the right upper quadrant but no evidence of cholecystitis. VSS. Moderate RUQ abdominal pain. Plan for labs, CT to further evaluate lesion, antiemetics, fluids, protonix.   CT without evidence of acute abdominopelvic process. No abdominal tenderness on repeat exam. Lab work up reassuring. Pt tolerated his contrast and fluids in ED. Pt stable for discharge with PCP and GI follow up. Script for zantac and zofran.  Discussed return precautions with patient. Discussed all results and patient verbalizes understanding and agrees with plan.   Oswaldo Conroy, PA-C 05/07/14 1610  Elwin Mocha, MD 05/07/14 [phone]

## 2014-05-07 ENCOUNTER — Encounter: Payer: Self-pay | Admitting: Gastroenterology

## 2014-05-07 ENCOUNTER — Other Ambulatory Visit: Payer: BLUE CROSS/BLUE SHIELD

## 2014-05-12 ENCOUNTER — Encounter (HOSPITAL_COMMUNITY): Payer: Self-pay | Admitting: Emergency Medicine

## 2014-05-12 ENCOUNTER — Emergency Department (HOSPITAL_COMMUNITY)
Admission: EM | Admit: 2014-05-12 | Discharge: 2014-05-13 | Disposition: A | Discharge status: Home / Self Care | Payer: BLUE CROSS/BLUE SHIELD | Attending: Emergency Medicine | Admitting: Emergency Medicine

## 2014-05-12 DIAGNOSIS — Z79899 Other long term (current) drug therapy: Secondary | ICD-10-CM | POA: Diagnosis not present

## 2014-05-12 DIAGNOSIS — R109 Unspecified abdominal pain: Secondary | ICD-10-CM | POA: Diagnosis present

## 2014-05-12 DIAGNOSIS — E785 Hyperlipidemia, unspecified: Secondary | ICD-10-CM | POA: Insufficient documentation

## 2014-05-12 DIAGNOSIS — E119 Type 2 diabetes mellitus without complications: Secondary | ICD-10-CM | POA: Diagnosis not present

## 2014-05-12 DIAGNOSIS — Z8673 Personal history of transient ischemic attack (TIA), and cerebral infarction without residual deficits: Secondary | ICD-10-CM | POA: Diagnosis not present

## 2014-05-12 DIAGNOSIS — K2970 Gastritis, unspecified, without bleeding: Secondary | ICD-10-CM | POA: Diagnosis not present

## 2014-05-12 DIAGNOSIS — Z87891 Personal history of nicotine dependence: Secondary | ICD-10-CM | POA: Diagnosis not present

## 2014-05-12 DIAGNOSIS — Z791 Long term (current) use of non-steroidal anti-inflammatories (NSAID): Secondary | ICD-10-CM | POA: Insufficient documentation

## 2014-05-12 DIAGNOSIS — Z8669 Personal history of other diseases of the nervous system and sense organs: Secondary | ICD-10-CM | POA: Diagnosis not present

## 2014-05-12 LAB — CBC WITH DIFFERENTIAL/PLATELET
BASOS ABS: 0 10*3/uL (ref 0.0–0.1)
BASOS PCT: 1 % (ref 0–1)
Eosinophils Absolute: 1.2 10*3/uL — ABNORMAL HIGH (ref 0.0–0.7)
Eosinophils Relative: 17 % — ABNORMAL HIGH (ref 0–5)
HEMATOCRIT: 43 % (ref 39.0–52.0)
HEMOGLOBIN: 13.9 g/dL (ref 13.0–17.0)
Lymphocytes Relative: 35 % (ref 12–46)
Lymphs Abs: 2.5 10*3/uL (ref 0.7–4.0)
MCH: 25 pg — ABNORMAL LOW (ref 26.0–34.0)
MCHC: 32.3 g/dL (ref 30.0–36.0)
MCV: 77.3 fL — ABNORMAL LOW (ref 78.0–100.0)
MONOS PCT: 6 % (ref 3–12)
Monocytes Absolute: 0.5 10*3/uL (ref 0.1–1.0)
NEUTROS PCT: 41 % — AB (ref 43–77)
Neutro Abs: 3 10*3/uL (ref 1.7–7.7)
Platelets: 218 10*3/uL (ref 150–400)
RBC: 5.56 MIL/uL (ref 4.22–5.81)
RDW: 15.6 % — ABNORMAL HIGH (ref 11.5–15.5)
WBC: 7.1 10*3/uL (ref 4.0–10.5)

## 2014-05-12 LAB — COMPREHENSIVE METABOLIC PANEL
ALBUMIN: 4.3 g/dL (ref 3.5–5.2)
ALT: 12 U/L (ref 0–53)
AST: 19 U/L (ref 0–37)
Alkaline Phosphatase: 67 U/L (ref 39–117)
Anion gap: 10 (ref 5–15)
BILIRUBIN TOTAL: 0.6 mg/dL (ref 0.3–1.2)
BUN: 10 mg/dL (ref 6–23)
CHLORIDE: 103 mmol/L (ref 96–112)
CO2: 27 mmol/L (ref 19–32)
Calcium: 9.2 mg/dL (ref 8.4–10.5)
Creatinine, Ser: 1.11 mg/dL (ref 0.50–1.35)
GFR calc Af Amer: >90 mL/min (ref >90)
GFR calc non Af Amer: 79 mL/min — ABNORMAL LOW (ref >90)
Glucose, Bld: 94 mg/dL (ref 70–99)
POTASSIUM: 3.8 mmol/L (ref 3.5–5.1)
SODIUM: 140 mmol/L (ref 135–145)
Total Protein: 7.8 g/dL (ref 6.0–8.3)

## 2014-05-12 LAB — LIPASE, BLOOD: Lipase: 24 U/L (ref 11–59)

## 2014-05-12 NOTE — ED Notes (Signed)
Pt c/o ongoing N/V and abdominal pain. Pt was seen last week for the same and was given prescription for acid reducers and nausea medication. Pt has been consistently taking medications without relief. Pt is also unable to keep food and medications down. Pt also sts he is not moving his bowels "normally." Pt sts he goes back and forth between diarrhea and constipation. Pt's abdominal pain is a sharp throbbing pain in his upper abdomen.

## 2014-05-13 ENCOUNTER — Telehealth: Payer: Self-pay

## 2014-05-13 LAB — CBG MONITORING, ED: Glucose-Capillary: 114 mg/dL — ABNORMAL HIGH (ref 70–99)

## 2014-05-13 MED ORDER — SUCRALFATE 1 G PO TABS: 1.0000 g | ORAL_TABLET | Freq: Four times a day (QID) | ORAL | Status: DC

## 2014-05-13 MED ORDER — SUCRALFATE 1 G PO TABS
1.0000 g | ORAL_TABLET | Freq: Once | ORAL | Status: AC
  Administered 2014-05-13: 1 g via ORAL
  Filled 2014-05-13: qty 1

## 2014-05-13 MED ORDER — PANTOPRAZOLE SODIUM 40 MG PO TBEC: 40.0000 mg | DELAYED_RELEASE_TABLET | Freq: Every day | ORAL | Status: DC

## 2014-05-13 MED ORDER — GI COCKTAIL ~~LOC~~
30.0000 mL | Freq: Once | ORAL | Status: AC
  Administered 2014-05-13: 30 mL via ORAL
  Filled 2014-05-13: qty 30

## 2014-05-13 MED ORDER — PANTOPRAZOLE SODIUM 40 MG PO TBEC
40.0000 mg | DELAYED_RELEASE_TABLET | Freq: Once | ORAL | Status: AC
  Administered 2014-05-13: 40 mg via ORAL
  Filled 2014-05-13: qty 1

## 2014-05-13 NOTE — Discharge Instructions (Signed)

## 2014-05-13 NOTE — Telephone Encounter (Signed)
Left message for pt to call back.  Pt scheduled to see Lawson FiscalLori Hvozdovic, PA-C tomorrow at 2:15pm. Pt aware of appt.

## 2014-05-13 NOTE — ED Provider Notes (Signed)
CSN: [id number]     Arrival date & time 05/12/14  2049 History   First MD Initiated Contact with Patient 05/12/14 2320     Chief Complaint  Patient presents with  . Emesis  . Abdominal Pain     (Consider location/radiation/quality/duration/timing/severity/associated sxs/prior Treatment) Patient is a 44 y.o. male presenting with vomiting and abdominal pain. The history is provided by the patient.  Emesis Severity:  Moderate Duration:  5 weeks Timing:  Constant Quality:  Stomach contents and bilious material Able to tolerate:  Liquids and solids Progression:  Unchanged Chronicity:  New Recent urination:  Normal Relieved by:  Nothing Worsened by:  Nothing tried Ineffective treatments:  Antiemetics (Ranitidine) Associated symptoms: abdominal pain (epigastric) and diarrhea   Associated symptoms: no chills, no cough, no fever, no myalgias, no sore throat and no URI   Risk factors: diabetes   Risk factors: no alcohol use, no prior abdominal surgery, no sick contacts, no suspect food intake and no travel to endemic areas   Abdominal Pain Associated symptoms: constipation (within same day as diarrhea), diarrhea, nausea and vomiting (intermittent, most days)   Associated symptoms: no chills, no cough, no fever, no shortness of breath and no sore throat     Past Medical History  Diagnosis Date  . Allergy   . Hyperlipidemia   . Gout   . Seizure disorder   . H/O: CVA (cardiovascular accident)   . LBP (low back pain)   . Knee pain, bilateral   . Plantar fasciitis     followed by Dr Harriet PhoAjlouny   Past Surgical History  Procedure Laterality Date  . Right shoulder surgery      x2  . Left heel surgery      x2   Family History  Problem Relation Age of Onset  . Diabetes Father   . Hypertension Father   . Heart attack Maternal Grandfather   . Cancer Maternal Uncle   . Allergies Daughter    History  Substance Use Topics  . Smoking status: Former Smoker -- 4 years    Types:  Cigarettes    Quit date: 12/22/1990  . Smokeless tobacco: Not on file     Comment: 1-2 packs a week  . Alcohol Use: No    Review of Systems  Constitutional: Negative for fever and chills.  HENT: Negative for sore throat.   Respiratory: Negative for cough and shortness of breath.   Gastrointestinal: Positive for nausea, vomiting (intermittent, most days), abdominal pain (epigastric), diarrhea and constipation (within same day as diarrhea).  Musculoskeletal: Negative for myalgias.  All other systems reviewed and are negative.     Allergies  Atorvastatin and Cymbalta  Home Medications   Prior to Admission medications   Medication Sig Start Date End Date Taking? Authorizing Provider  cyclobenzaprine (FLEXERIL) 10 MG tablet Take 1 tablet (10 mg total) by mouth as needed. 07/26/12  Yes Doe-Hyun R Artist PaisYoo, DO  losartan (COZAAR) 25 MG tablet Take 1 tablet (25 mg total) by mouth daily. 03/19/14  Yes Doe-Hyun R Artist PaisYoo, DO  metFORMIN (GLUCOPHAGE) 500 MG tablet TAKE 2 TABLETS BY MOUTH TWICE A DAY WITH A MEAL 03/03/14  Yes Doe-Hyun R Yoo, DO  ondansetron (ZOFRAN) 4 MG tablet Take 1 tablet (4 mg total) by mouth every 6 (six) hours. 05/06/14  Yes Oswaldo ConroyVictoria Creech, PA-C  ranitidine (ZANTAC) 150 MG capsule Take 1 capsule (150 mg total) by mouth daily. 05/06/14  Yes Oswaldo ConroyVictoria Creech, PA-C  rosuvastatin (CRESTOR) 20 MG tablet Take 1  tablet (20 mg total) by mouth daily. 03/19/14  Yes Doe-Hyun Sherran Needs, DO  diclofenac sodium (VOLTAREN) 1 % GEL Apply 1 application topically 4 (four) times daily.    Historical Provider, MD  escitalopram (LEXAPRO) 20 MG tablet Take 20 mg by mouth daily. Once daily 01/07/14   Historical Provider, MD  HYDROcodone-acetaminophen (VICODIN) 5-500 MG per tablet Take 1 tablet by mouth every 8 (eight) hours as needed for pain. Patient not taking: Reported on 05/06/2014 04/13/12   Doe-Hyun R Artist Pais, DO  lidocaine (XYLOCAINE) 5 % ointment Apply topically daily.    Historical Provider, MD  methocarbamol  (ROBAXIN) 500 MG tablet Take 500 mg by mouth every 6 (six) hours as needed for muscle spasms.    Historical Provider, MD   BP 123/74 mmHg  Pulse 78  Temp(Src) 98.2 F (36.8 C) (Oral)  Resp 18  SpO2 100% Physical Exam  Constitutional: He is oriented to person, place, and time. He appears well-developed and well-nourished. No distress.  HENT:  Head: Normocephalic and atraumatic.  Mouth/Throat: No oropharyngeal exudate.  Eyes: EOM are normal. Pupils are equal, round, and reactive to light.  Neck: Normal range of motion. Neck supple.  Cardiovascular: Normal rate and regular rhythm.  Exam reveals no friction rub.   No murmur heard. Pulmonary/Chest: Effort normal and breath sounds normal. No respiratory distress. He has no wheezes. He has no rales.  Abdominal: He exhibits no distension. There is tenderness (mild, epigastric). There is no rebound.  Musculoskeletal: Normal range of motion. He exhibits no edema.  Neurological: He is alert and oriented to person, place, and time.  Skin: No rash noted. He is not diaphoretic.  Nursing note and vitals reviewed.   ED Course  Procedures (including critical care time) Labs Review Labs Reviewed  CBC WITH DIFFERENTIAL/PLATELET - Abnormal; Notable for the following:    MCV 77.3 (*)    MCH 25.0 (*)    RDW 15.6 (*)    Neutrophils Relative % 41 (*)    Eosinophils Relative 17 (*)    Eosinophils Absolute 1.2 (*)    All other components within normal limits  COMPREHENSIVE METABOLIC PANEL - Abnormal; Notable for the following:    GFR calc non Af Amer 79 (*)    All other components within normal limits  LIPASE, BLOOD    Imaging Review No results found.   EKG Interpretation None      MDM   Final diagnoses:  Gastritis    44 year old male here with epigastric pain, nausea, vomiting, intermittent diarrhea. He is had this pain for 5 weeks. He was seen 6 days ago here and had normal labs normal CT scan at that time. He was given H1 blockers  and Zofran with some mild relief. He is still eating, but having pain with eating. He has not developed any fevers. He saw his PCP and had a GI referral, but GI cannot see him for 2 months. Here he is afebrile vital signs are stable. He is mild epigastric pain on exam. No right upper quadrant or lower quadrant pain. No peritoneal signs or guarding. Lipase and other labs are normal. With normal CT 6 days ago in the midst of this illness and no change in labs or development of fever, I do not believe he warrants another repeat CT scan today. He has no peritoneal signs. I believe he might have gastritis. He is not having immediate vomiting after eating. He does not appear dehydrated and his vitals are stable. We'll treat  with PPI, Carafate, GI cocktail here. Also talk with GI and see if we can expedite his follow-up. GI will help expedite follow-up and have him seen soon. Sent home on PPI.   Elwin Mocha, MD 05/13/14 [phone]

## 2014-05-13 NOTE — Telephone Encounter (Signed)
-----   Message from Beverley FiedlerJay M Pyrtle, MD sent at 05/13/2014  9:21 AM EDT ----- Called by ED last night This pt needs a sooner appt for persistent problems. Can be with APP if available Thanks JMP  ----- Message -----    From: Elwin MochaBlair Walden, MD    Sent: 05/13/2014  12:47 AM      To: Beverley FiedlerJay M Pyrtle, MD  Dr. Rhea BeltonPyrtle,  I spoke with you on the phone about this gentleman. He's a [facility] patient and has f/u with you in a few months, however he's having persistent symptoms of nausea, vomiting, diarrhea for past 5 weeks. He had a normal CT scan last week. Labs today and vitals are all ok.  Thanks for helping me get him some expeditious follow-up.  Elwin MochaBlair Walden

## 2014-05-14 ENCOUNTER — Encounter: Payer: Self-pay | Admitting: *Deleted

## 2014-05-14 ENCOUNTER — Ambulatory Visit (INDEPENDENT_AMBULATORY_CARE_PROVIDER_SITE_OTHER): Payer: BLUE CROSS/BLUE SHIELD | Admitting: Physician Assistant

## 2014-05-14 VITALS — BP 104/80 | HR 72 | Ht 70.0 in | Wt 258.6 lb

## 2014-05-14 DIAGNOSIS — K219 Gastro-esophageal reflux disease without esophagitis: Secondary | ICD-10-CM | POA: Diagnosis not present

## 2014-05-14 DIAGNOSIS — R112 Nausea with vomiting, unspecified: Secondary | ICD-10-CM

## 2014-05-14 DIAGNOSIS — R1013 Epigastric pain: Secondary | ICD-10-CM

## 2014-05-14 NOTE — Patient Instructions (Signed)
You have been scheduled for an endoscopy. Please follow written instructions given to you at your visit today. If you use inhalers (even only as needed), please bring them with you on the day of your procedure. Your physician has requested that you go to www.startemmi.com and enter the access code given to you at your visit today. This web site gives a general overview about your procedure. However, you should still follow specific instructions given to you by our office regarding your preparation for the procedure.  Continue Omeprazole  Gastroesophageal Reflux Disease, Adult Gastroesophageal reflux disease (GERD) happens when acid from your stomach flows up into the esophagus. When acid comes in contact with the esophagus, the acid causes soreness (inflammation) in the esophagus. Over time, GERD may create small holes (ulcers) in the lining of the esophagus. CAUSES   Increased body weight. This puts pressure on the stomach, making acid rise from the stomach into the esophagus.  Smoking. This increases acid production in the stomach.  Drinking alcohol. This causes decreased pressure in the lower esophageal sphincter (valve or ring of muscle between the esophagus and stomach), allowing acid from the stomach into the esophagus.  Late evening meals and a full stomach. This increases pressure and acid production in the stomach.  A malformed lower esophageal sphincter. Sometimes, no cause is found. SYMPTOMS   Burning pain in the lower part of the mid-chest behind the breastbone and in the mid-stomach area. This may occur twice a week or more often.  Trouble swallowing.  Sore throat.  Dry cough.  Asthma-like symptoms including chest tightness, shortness of breath, or wheezing. DIAGNOSIS  Your caregiver may be able to diagnose GERD based on your symptoms. In some cases, X-rays and other tests may be done to check for complications or to check the condition of your stomach and  esophagus. TREATMENT  Your caregiver may recommend over-the-counter or prescription medicines to help decrease acid production. Ask your caregiver before starting or adding any new medicines.  HOME CARE INSTRUCTIONS   Change the factors that you can control. Ask your caregiver for guidance concerning weight loss, quitting smoking, and alcohol consumption.  Avoid foods and drinks that make your symptoms worse, such as:  Caffeine or alcoholic drinks.  Chocolate.  Peppermint or mint flavorings.  Garlic and onions.  Spicy foods.  Citrus fruits, such as oranges, lemons, or limes.  Tomato-based foods such as sauce, chili, salsa, and pizza.  Fried and fatty foods.  Avoid lying down for the 3 hours prior to your bedtime or prior to taking a nap.  Eat small, frequent meals instead of large meals.  Wear loose-fitting clothing. Do not wear anything tight around your waist that causes pressure on your stomach.  Raise the head of your bed 6 to 8 inches with wood blocks to help you sleep. Extra pillows will not help.  Only take over-the-counter or prescription medicines for pain, discomfort, or fever as directed by your caregiver.  Do not take aspirin, ibuprofen, or other nonsteroidal anti-inflammatory drugs (NSAIDs). SEEK IMMEDIATE MEDICAL CARE IF:   You have pain in your arms, neck, jaw, teeth, or back.  Your pain increases or changes in intensity or duration.  You develop nausea, vomiting, or sweating (diaphoresis).  You develop shortness of breath, or you faint.  Your vomit is green, yellow, black, or looks like coffee grounds or blood.  Your stool is red, bloody, or black. These symptoms could be signs of other problems, such as heart disease, gastric bleeding,  or esophageal bleeding. MAKE SURE YOU:   Understand these instructions.  Will watch your condition.  Will get help right away if you are not doing well or get worse. Document Released: 10/27/2004 Document  Revised: 04/11/2011 Document Reviewed: 08/06/2010 [facility] Patient Information 2015 [place], [place]. This information is not intended to replace advice given to you by your health care provider. Make sure you discuss any questions you have with your health care provider.  Food Choices for Gastroesophageal Reflux Disease When you have gastroesophageal reflux disease (GERD), the foods you eat and your eating habits are very important. Choosing the right foods can help ease the discomfort of GERD. WHAT GENERAL GUIDELINES DO I NEED TO FOLLOW?  Choose fruits, vegetables, whole grains, low-fat dairy products, and low-fat meat, fish, and poultry.  Limit fats such as oils, salad dressings, butter, nuts, and avocado.  Keep a food diary to identify foods that cause symptoms.  Avoid foods that cause reflux. These may be different for different people.  Eat frequent small meals instead of three large meals each day.  Eat your meals slowly, in a relaxed setting.  Limit fried foods.  Cook foods using methods other than frying.  Avoid drinking alcohol.  Avoid drinking large amounts of liquids with your meals.  Avoid bending over or lying down until 2-3 hours after eating. WHAT FOODS ARE NOT RECOMMENDED? The following are some foods and drinks that may worsen your symptoms: Vegetables Tomatoes. Tomato juice. Tomato and spaghetti sauce. Chili peppers. Onion and garlic. Horseradish. Fruits Oranges, grapefruit, and lemon (fruit and juice). Meats High-fat meats, fish, and poultry. This includes hot dogs, ribs, ham, sausage, salami, and bacon. Dairy Whole milk and chocolate milk. Sour cream. Cream. Butter. Ice cream. Cream cheese.  Beverages Coffee and tea, with or without caffeine. Carbonated beverages or energy drinks. Condiments Hot sauce. Barbecue sauce.  Sweets/Desserts Chocolate and cocoa. Donuts. Peppermint and spearmint. Fats and Oils High-fat foods, including [place] fries and potato  chips. Other Vinegar. Strong spices, such as black pepper, white pepper, red pepper, cayenne, curry powder, cloves, ginger, and chili powder. The items listed above may not be a complete list of foods and beverages to avoid. Contact your dietitian for more information. Document Released: 01/17/2005 Document Revised: 01/22/2013 Document Reviewed: 11/21/2012 [facility] Patient Information 2015 [place], [place]. This information is not intended to replace advice given to you by your health care provider. Make sure you discuss any questions you have with your health care provider.

## 2014-05-15 ENCOUNTER — Encounter: Payer: Self-pay | Admitting: Physician Assistant

## 2014-05-15 ENCOUNTER — Telehealth: Payer: Self-pay | Admitting: *Deleted

## 2014-05-15 NOTE — Progress Notes (Signed)
Patient ID: [name], male   DOB: Feb 20, 1970, 44 y.o.   MRN: [id number]    HPI:  [name] is a 44 y.o.   male referred by Artist Pais, Doe-Hyun R, DO for evaluation of epigastric pain.  [name] is a pleasant 44 year old African American male who was evaluated by Dr. Caryl Never on March 31 with a three-week history of epigastric and right upper quadrant pain. He had noted some weight loss over the preceding several months but states it was intentional. His appetite was good he reports that about 4 weeks ago he started to develop epigastric and right upper quadrant pain. His stools became to alternate between constipation and diarrhea. He has vomited several times, and has been having heartburn, belching, and burping. He tried Mylanta with no relief. The pain is worse after meals. He feels full sooner than normal and has vomited undigested food. He has no nocturnal regurgitation. He has been having some intermittent dysphagia to solids and liquids and has had to spit food up. He was seen in the emergency room last week and had an abdominal ultrasound and CT that were okay aside from an adrenal mass for which it was recommended that he have a repeat CT in 1 year. He was started on pantoprazole and Carafate which had somewhat diminished his symptoms. Intermittent dysphagia diminished since on pantoprazole. He has had no bright red per rectum and denies any melena. He reports that to send days ago he had stabbing epigastric pain, became very nauseous, and vomited brown fluid with solid components that he feels looks like old food. His sugars have been fairly well-controlled and have been running 88-104.Marland Kitchen He has never had colorectal cancer screening. He denies a family history of colon cancer, colon polyps, or inflammatory bowel disease.  [name]'s past medical history includes obstructive sleep apnea, allergic rhinitis, type 2 diabetes, plantar fasciitis, lumbar spondylosis, BPH, hyperlipidemia, gout,  seizure disorder, and history of CVA.  Past Medical History  Diagnosis Date  . Allergy   . Hyperlipidemia   . Gout   . Seizure disorder   . H/O: CVA (cardiovascular accident)   . LBP (low back pain)   . Knee pain, bilateral   . Plantar fasciitis     followed by Dr Harriet Pho  . Adrenal abnormality   . DM (diabetes mellitus)     Past Surgical History  Procedure Laterality Date  . Right shoulder surgery      x2  . Left heel surgery      x2   Family History  Problem Relation Age of Onset  . Diabetes Father   . Hypertension Father   . Heart attack Maternal Grandfather   . Cancer Maternal Uncle   . Allergies Daughter    History  Substance Use Topics  . Smoking status: Former Smoker -- 4 years    Types: Cigarettes    Quit date: 12/22/1990  . Smokeless tobacco: Never Used     Comment: 1-2 packs a week  . Alcohol Use: No   Current Outpatient Prescriptions  Medication Sig Dispense Refill  . cyclobenzaprine (FLEXERIL) 10 MG tablet Take 1 tablet (10 mg total) by mouth as needed. 30 tablet 1  . diclofenac sodium (VOLTAREN) 1 % GEL Apply 1 application topically 4 (four) times daily.    Marland Kitchen escitalopram (LEXAPRO) 20 MG tablet Take 20 mg by mouth daily. Once daily  0  . HYDROcodone-acetaminophen (VICODIN) 5-500 MG per tablet Take 1 tablet by mouth every 8 (eight) hours  as needed for pain. 60 tablet 3  . lidocaine (XYLOCAINE) 5 % ointment Apply topically daily.    Marland Kitchen. losartan (COZAAR) 25 MG tablet Take 1 tablet (25 mg total) by mouth daily. 90 tablet 1  . metFORMIN (GLUCOPHAGE) 500 MG tablet TAKE 2 TABLETS BY MOUTH TWICE A DAY WITH A MEAL 180 tablet 0  . methocarbamol (ROBAXIN) 500 MG tablet Take 500 mg by mouth every 6 (six) hours as needed for muscle spasms.    . ondansetron (ZOFRAN) 4 MG tablet Take 1 tablet (4 mg total) by mouth every 6 (six) hours. 12 tablet 0  . pantoprazole (PROTONIX) 40 MG tablet Take 1 tablet (40 mg total) by mouth daily. 30 tablet 0  . Pediatric Multiple  Vit-C-FA (PEDIATRIC MULTIVITAMIN) chewable tablet Chew 1 tablet by mouth daily.    . ranitidine (ZANTAC) 150 MG capsule Take 1 capsule (150 mg total) by mouth daily. 30 capsule 0  . rosuvastatin (CRESTOR) 20 MG tablet Take 1 tablet (20 mg total) by mouth daily. 90 tablet 3  . sucralfate (CARAFATE) 1 G tablet Take 1 tablet (1 g total) by mouth 4 (four) times daily. 40 tablet 0  . traMADol (ULTRAM) 50 MG tablet Take 100 mg by mouth every 6 (six) hours as needed for moderate pain.    . traZODone (DESYREL) 100 MG tablet   0   No current facility-administered medications for this visit.   Allergies  Allergen Reactions  . Atorvastatin Swelling    Tingling, numbness.    . Cymbalta [Duloxetine Hcl]     Stomach upset, difficulty urinating, decreased libido     Review of Systems: Gen: Denies any fever, chills, sweats, anorexia, fatigue, weakness, malaise, weight loss, and sleep disorder CV: Denies chest pain, angina, palpitations, syncope, orthopnea, PND, peripheral edema, and claudication. Resp: Denies dyspnea at rest, dyspnea with exercise, cough, sputum, wheezing, coughing up blood, and pleurisy. GI: Denies vomiting blood, jaundice, and fecal incontinence.   Occ dysphagia to solids and liquids. GU : Denies urinary burning, blood in urine, urinary frequency, urinary hesitancy, nocturnal urination, and urinary incontinence. MS: Denies joint pain, limitation of movement, and swelling, stiffness, low back pain, extremity pain. Denies muscle weakness, cramps, atrophy.  Derm: Denies rash, itching, dry skin, hives, moles, warts, or unhealing ulcers.  Psych: Denies depression, anxiety, memory loss, suicidal ideation, hallucinations, paranoia, and confusion. Heme: Denies bruising, bleeding, and enlarged lymph nodes. Neuro:  Denies any headaches, dizziness, paresthesias. Endo:  Denies any problems with thyroid, adrenal function  Studies: [place] Abdomen Complete  05/06/2014   CLINICAL DATA:  Abdominal  pain.  EXAM: ULTRASOUND ABDOMEN COMPLETE  COMPARISON:  CT 09/13/2008.  FINDINGS: Gallbladder: No gallstones or wall thickening visualized. No sonographic Murphy sign noted.  Common bile duct: Diameter: 3.4 mm  Liver: No focal lesion identified. Within normal limits in parenchymal echogenicity.  IVC: No abnormality visualized.  Pancreas: Visualized portion unremarkable.  Spleen: Size and appearance within normal limits.  Right Kidney: Length: 11.0 cm. Echogenicity within normal limits. No hydronephrosis visualized. A 1.8 x 1.7 x 2.1 cm rounded solid appearing mass is noted in the right upper quadrant adjacent to the kidney. This could represent adrenal lesion. Gout enhanced MRI of the abdomen suggested to further evaluate.  Left Kidney: Length: 11.8 cm. Echogenicity within normal limits. No mass or hydronephrosis visualized.  Abdominal aorta: No aneurysm visualized.  Other findings: None.  IMPRESSION: 1.8 x 1.7 x 2.1 cm rounded solid nodular lesion noted in the right upper abdomen adjacent to  the kidney. This may represent an adrenal lesion including adrenal adenoma. Gadolinium-enhanced MRI of the abdomen is suggested to further evaluate. Exam is otherwise unremarkable.   Electronically Signed   By: Maisie Fus  Register   On: 05/06/2014 09:48   Ct Abdomen Pelvis W Contrast  05/06/2014   CLINICAL DATA:  Nausea and vomiting with right upper quadrant pain for 3 weeks. Dizziness today. Ultrasound demonstrated suprarenal mass in the right upper quadrant.  EXAM: CT ABDOMEN AND PELVIS WITH CONTRAST  TECHNIQUE: Multidetector CT imaging of the abdomen and pelvis was performed using the standard protocol following bolus administration of intravenous contrast.  CONTRAST:  OMNIPAQUE IOHEXOL 300 MG/ML  SOLN  COMPARISON:  Ultrasound abdomen 05/06/2014  FINDINGS: Atelectasis in the lung bases.  Right adrenal gland nodule corresponding to lesion demonstrated on ultrasound. Nodule measures 2 cm diameter. Characterization is  limited without noncontrast imaging. Washout is calculated at 29% which is indeterminate. Based on size and homogeneous appearance, follow-up in 12 months with noncontrast CT or MR is suggested. This recommendation follows ACR consensus guidelines: Managing Incidental Findings on Abdominal CT: White Paper of the ACR Incidental Findings Committee. J Am Coll Radiol 2010;7:754-773  The liver, spleen, gallbladder, pancreas, kidneys, abdominal aorta, inferior vena cava, and retroperitoneal lymph nodes are unremarkable. Stomach, small bowel, and colon appear normal for degree of distention. No free air or free fluid in the abdomen. Small amount of fat in the umbilicus.  Pelvis: Prostate gland is not enlarged. Bladder wall is not thickened. Appendix is normal. No free or loculated pelvic fluid collections. No pelvic mass or lymphadenopathy. No destructive bone lesions.  IMPRESSION: 2 cm diameter right adrenal gland nodule. Suggest follow-up in 12 months with noncontrast CT or MRI.   Electronically Signed   By: Burman Nieves M.D.   On: 05/06/2014 23:18    LAB RESULTS:  Recent Labs  05/12/14 2201  WBC 7.1  HGB 13.9  HCT 43.0  PLT 218   BMET  Recent Labs  05/12/14 2201  NA 140  K 3.8  CL 103  CO2 27  GLUCOSE 94  BUN 10  CREATININE 1.11  CALCIUM 9.2   LFT  Recent Labs  05/12/14 2201  PROT 7.8  ALBUMIN 4.3  AST 19  ALT 12  ALKPHOS 67  BILITOT 0.6     Physical Exam: BP 104/80 mmHg  Pulse 72  Ht 5\' 10"  (1.778 m)  Wt 258 lb 9.6 oz (117.3 kg)  BMI 37.11 kg/m2 Constitutional: Pleasant,well-developed male in no acute distress. HEENT: Normocephalic and atraumatic. Conjunctivae are normal. No scleral icterus. Neck supple. No adenopathy Cardiovascular: Normal rate, regular rhythm.  Pulmonary/chest: Effort normal and breath sounds normal. No wheezing, rales or rhonchi. Abdominal: Soft, nondistended, nontender. Bowel sounds active throughout. There are no masses palpable. No  hepatomegaly. Extremities: no edema Lymphadenopathy: No cervical adenopathy noted. Neurological: Alert and oriented to person place and time. Skin: Skin is warm and dry. No rashes noted. Psychiatric: Normal mood and affect. Behavior is normal.  ASSESSMENT AND PLAN: 44 year old male referred for evaluation of epigastric pain, GERD, and nausea. Hepatic function tests, ultrasound, and CT have been nonrevealing aside from an adrenal lesion for which he will follow up with his PCP. She has been instructed to continue an antireflux regimen and his pantoprazole. He will be scheduled for an EGD to assess for esophagitis, gastritis, or ulcer.The risks, benefits, and alternatives to endoscopy with possible biopsy and possible dilation were discussed with the patient and they consent to  proceed. The procedure will be scheduled with Dr. Marina Goodell. Further recommendations will be made pending the findings of his EGD. Follow-up to be scheduled per Dr. Lamar Sprinkles recommendation, and colorectal cancer screening can be reviewed at that visit.    [name], Tollie Pizza PA-C 05/15/2014, 8:00 AM  CC: Artist Pais, Doe-Hyun R, DO

## 2014-05-15 NOTE — Telephone Encounter (Signed)
[name] , I just spoke to the patient and no he has never had a colonoscopy before

## 2014-05-15 NOTE — Telephone Encounter (Signed)
==  View-only below this line===  ----- Message -----    From: Tollie PizzaLori P Hvozdovic, PA-C    Sent: 05/15/2014   8:04 AM      To: Marlowe Kaysobin M Aleyza Salmi, CMA  Iyad Deroo, can you call pt to see if he has had a colonoscopy and if so where? --and let me know. Thanks, Lawson FiscalLori   L/M for patient to return my call

## 2014-05-16 ENCOUNTER — Other Ambulatory Visit: Payer: Self-pay | Admitting: Internal Medicine

## 2014-05-16 DIAGNOSIS — K219 Gastro-esophageal reflux disease without esophagitis: Secondary | ICD-10-CM

## 2014-05-20 NOTE — Progress Notes (Signed)
Agree with initial assessment and plans as outlined 

## 2014-05-21 ENCOUNTER — Ambulatory Visit (AMBULATORY_SURGERY_CENTER): Payer: BLUE CROSS/BLUE SHIELD | Admitting: Internal Medicine

## 2014-05-21 ENCOUNTER — Encounter: Payer: Self-pay | Admitting: Internal Medicine

## 2014-05-21 VITALS — BP 110/47 | HR 72 | Temp 98.8°F | Resp 11 | Ht 70.0 in | Wt 258.0 lb

## 2014-05-21 DIAGNOSIS — K2100 Gastro-esophageal reflux disease with esophagitis, without bleeding: Secondary | ICD-10-CM

## 2014-05-21 DIAGNOSIS — R1013 Epigastric pain: Secondary | ICD-10-CM

## 2014-05-21 DIAGNOSIS — R112 Nausea with vomiting, unspecified: Secondary | ICD-10-CM

## 2014-05-21 LAB — GLUCOSE, CAPILLARY
GLUCOSE-CAPILLARY: 81 mg/dL (ref 70–99)
Glucose-Capillary: 75 mg/dL (ref 70–99)

## 2014-05-21 MED ORDER — SODIUM CHLORIDE 0.9 % IV SOLN: 500.0000 mL | INTRAVENOUS | Status: DC

## 2014-05-21 NOTE — Op Note (Signed)
[facility] [address] [place], [id number]   ENDOSCOPY PROCEDURE REPORT  PATIENT: Mark AlyBradshaw, Levar R  MR#: [id number] BIRTHDATE: Jul 27, 1970 , 43  yrs. old GENDER: male ENDOSCOPIST: Roxy CedarJohn N Ikea Demicco Jr, MD REFERRED BY:  Thomos Lemonsobert Yoo, DO PROCEDURE DATE:  05/21/2014 PROCEDURE:  EGD, diagnostic ASA CLASS:     Class II INDICATIONS:  melena, vomiting, epigastric pain, and history of esophageal reflux.  Improved on acid suppression therapy MEDICATIONS: Monitored anesthesia care and Propofol 200 mg IV TOPICAL ANESTHETIC: none  DESCRIPTION OF PROCEDURE: After the risks benefits and alternatives of the procedure were thoroughly explained, informed consent was obtained.  The LB [id number] endoscope was introduced through the mouth and advanced to the second portion of the duodenum , Without limitations.  The instrument was slowly withdrawn as the mucosa was fully examined.  EXAM: The esophagus and gastroesophageal junction were completely normal in appearance.  The stomach was entered and closely examined.The antrum, angularis, and lesser curvature were well visualized, including a retroflexed view of the cardia and fundus. The stomach wall was normally distensable.  The scope passed easily through the pylorus into the duodenum.  Retroflexed views revealed no abnormalities.     The scope was then withdrawn from the patient and the procedure completed.  COMPLICATIONS: There were no immediate complications.  ENDOSCOPIC IMPRESSION: 1. Normal EGD 2. GERD. Improved on current medical therapy  RECOMMENDATIONS: 1.  Anti-reflux regimen to be followed 2.  Continue current medications for GERD (pantoprazole 40 mg daily and ranitidine) 3.  OP follow-up with Dr. Marina GoodellPerry in 6 weeks. Please call in the next few days to arrange that appointment.  REPEAT EXAM:  eSigned:  Roxy CedarJohn N Karol Skarzynski Jr, MD 05/21/2014 8:55 AM    IO:[id number]:Robert Artist PaisYoo, DO and The Patient

## 2014-05-21 NOTE — Progress Notes (Signed)
Report to PACU, RN, vss, BBS= Clear.  

## 2014-05-21 NOTE — Patient Instructions (Signed)
YOU HAD AN ENDOSCOPIC PROCEDURE TODAY AT THE [facility]:   Refer to the procedure report that was given to you for any specific questions about what was found during the examination.  If the procedure report does not answer your questions, please call your gastroenterologist to clarify.  If you requested that your care partner not be given the details of your procedure findings, then the procedure report has been included in a sealed envelope for you to review at your convenience later.  YOU SHOULD EXPECT: Some feelings of bloating in the abdomen. Passage of more gas than usual.  Walking can help get rid of the air that was put into your GI tract during the procedure and reduce the bloating. If you had a lower endoscopy (such as a colonoscopy or flexible sigmoidoscopy) you may notice spotting of blood in your stool or on the toilet paper. If you underwent a bowel prep for your procedure, you may not have a normal bowel movement for a few days.  Please Note:  You might notice some irritation and congestion in your nose or some drainage.  This is from the oxygen used during your procedure.  There is no need for concern and it should clear up in a day or so.  SYMPTOMS TO REPORT IMMEDIATELY:    Following upper endoscopy (EGD)  Vomiting of blood or coffee ground material  New chest pain or pain under the shoulder blades  Painful or persistently difficult swallowing  New shortness of breath  Fever of 100F or higher  Black, tarry-looking stools  For urgent or emergent issues, a gastroenterologist can be reached at any hour by calling [phone].   DIET: Your first meal following the procedure should be a small meal and then it is ok to progress to your normal diet. Heavy or fried foods are harder to digest and may make you feel nauseous or bloated.  Likewise, meals heavy in dairy and vegetables can increase bloating.  Drink plenty of fluids but you should avoid alcoholic beverages  for 24 hours.  ACTIVITY:  You should plan to take it easy for the rest of today and you should NOT DRIVE or use heavy machinery until tomorrow (because of the sedation medicines used during the test).    FOLLOW UP: Our staff will call the number listed on your records the next business day following your procedure to check on you and address any questions or concerns that you may have regarding the information given to you following your procedure. If we do not reach you, we will leave a message.  However, if you are feeling well and you are not experiencing any problems, there is no need to return our call.  We will assume that you have returned to your regular daily activities without incident.  If any biopsies were taken you will be contacted by phone or by letter within the next 1-3 weeks.  Please call us at [phone] if you have not heard about the biopsies in 3 weeks.    SIGNATURES/CONFIDENTIALITY: You and/or your care partner have signed paperwork which will be entered into your electronic medical record.  These signatures attest to the fact that that the information above on your After Visit Summary has been reviewed and is understood.  Full responsibility of the confidentiality of this discharge information lies with you and/or your care-partner.  Recommendations Continue Protonix and Ranitidine. Discharge instructions given to patient and/or care partner. Follow up appointment with Dr. Marina Goodell  in 6 weeks. Please call to make appointment.

## 2014-05-22 ENCOUNTER — Telehealth: Payer: Self-pay | Admitting: *Deleted

## 2014-05-22 NOTE — Telephone Encounter (Signed)
  Follow up Call-  Call back number 05/21/2014  Post procedure Call Back phone  # [phone]  Permission to leave phone message Yes     Patient questions:  Do you have a fever, pain , or abdominal swelling? No. Pain Score  0 *  Have you tolerated food without any problems? Yes.    Have you been able to return to your normal activities? Yes.    Do you have any questions about your discharge instructions: Diet   No. Medications  No. Follow up visit  No.  Do you have questions or concerns about your Care? No.  Actions: * If pain score is 4 or above: No action needed, pain <4.

## 2014-06-07 ENCOUNTER — Other Ambulatory Visit: Payer: Self-pay | Admitting: Internal Medicine

## 2014-07-03 ENCOUNTER — Encounter: Payer: Self-pay | Admitting: Internal Medicine

## 2014-07-03 ENCOUNTER — Ambulatory Visit (INDEPENDENT_AMBULATORY_CARE_PROVIDER_SITE_OTHER): Payer: BLUE CROSS/BLUE SHIELD | Admitting: Internal Medicine

## 2014-07-03 VITALS — BP 100/68 | HR 72 | Ht 70.0 in | Wt 265.2 lb

## 2014-07-03 DIAGNOSIS — K219 Gastro-esophageal reflux disease without esophagitis: Secondary | ICD-10-CM | POA: Diagnosis not present

## 2014-07-03 DIAGNOSIS — R1013 Epigastric pain: Secondary | ICD-10-CM | POA: Diagnosis not present

## 2014-07-03 DIAGNOSIS — R112 Nausea with vomiting, unspecified: Secondary | ICD-10-CM

## 2014-07-03 DIAGNOSIS — E669 Obesity, unspecified: Secondary | ICD-10-CM

## 2014-07-03 NOTE — Progress Notes (Signed)
HISTORY OF PRESENT ILLNESS:  [name] is a 44 y.o. male who was evaluated by the GI physician assistant as a new patient 05/14/2014 regarding epigastric/right upper quadrant pain with episodes of nausea and vomiting. Also mild weight loss. He underwent evaluation including extensive laboratories as well as imaging in the form of ultrasound and CT. No relevant abnormalities identified. He was subsequently placed on PPI and Carafate which seemed to improve symptoms. He also was having some intermittent dysphagia which improved. He was set up for upper endoscopy which was performed 05/21/2014. Examination was normal. Clinical impression was GERD. It was recommended that the patient comply with reflux precautions, continue on pantoprazole and ranitidine, and follow-up at this time. The patient tells me that he has been on no acid reflux medication for about one month (prescriptions ran out). He did however have his pantoprazole refilled today. For the most part he has done well without symptoms except for May 12 when, while at a [employer], he developed epigastric burning discomfort followed by nausea and vomiting. He took PPI for a few days and symptom resolved. He has had weight gain since his last visit (7 pounds). No new complaints. GI review of systems is otherwise entirely negative.  REVIEW OF SYSTEMS:  All non-GI ROS negative except for arthritis and allergies  Past Medical History  Diagnosis Date  . Allergy   . Hyperlipidemia   . Gout   . Seizure disorder   . H/O: CVA (cardiovascular accident)   . LBP (low back pain)   . Knee pain, bilateral   . Plantar fasciitis     followed by Dr Harriet Pho  . Adrenal abnormality   . DM (diabetes mellitus)   . GERD (gastroesophageal reflux disease)   . OSA (obstructive sleep apnea)   . BPH (benign prostatic hypertrophy)     Past Surgical History  Procedure Laterality Date  . Right shoulder surgery      x2  . Left heel surgery     x2    Social History [name]  reports that he quit smoking about 23 years ago. His smoking use included Cigarettes. He quit after 4 years of use. He has never used smokeless tobacco. He reports that he does not drink alcohol or use illicit drugs.  family history includes Allergies in his daughter; Cancer in his maternal uncle; Diabetes in his father; Heart attack in his maternal grandfather; Hypertension in his father.  Allergies  Allergen Reactions  . Atorvastatin Swelling    Tingling, numbness.    . Cymbalta [Duloxetine Hcl]     Stomach upset, difficulty urinating, decreased libido       PHYSICAL EXAMINATION: Vital signs: BP 100/68 mmHg  Pulse 72  Ht  (1.778 m)  Wt 265 lb 3.2 oz (120.294 kg)  BMI 38.05 kg/m2 General: Pleasant, obese, Well-developed, well-nourished, no acute distress HEENT: Sclerae are anicteric, conjunctiva pink. Oral mucosa intact Lungs: Clear Heart: Regular Abdomen: soft, obese, nontender, nondistended, no obvious ascites, no peritoneal signs, normal bowel sounds. No organomegaly. Extremities: No edema Psychiatric: alert and oriented x3. Cooperative   ASSESSMENT:  #1. GERD. Problem resolved with course of PPI therapy. For the most part has done well off PPI therapy with the exception of 3 weeks ago as described #2. Obesity #3. Colon cancer screening. Baseline risk   PLAN:  #1. Reflux precautions with attention to weight loss #2. The patient asked about special reflux diet. I provided him with a GERD dietary pamphlet #3.  Exercise and weight loss. The importance of weight reduction stressed #4. Colon cancer screening at age [age]#5. Okay to use PPI on demand. If symptoms frequent, use regularly #6. Return to the care of Dr. Artist PaisYoo. GI follow-up as needed  15 minutes was spent with the patient. Greater than 50% of the time used for counseling regarding the above impression and plan

## 2014-07-03 NOTE — Patient Instructions (Signed)
Please follow up with Dr. Perry as needed 

## 2014-07-07 ENCOUNTER — Ambulatory Visit: Payer: BLUE CROSS/BLUE SHIELD | Admitting: Gastroenterology

## 2014-08-01 ENCOUNTER — Other Ambulatory Visit: Payer: Self-pay | Admitting: Internal Medicine

## 2014-09-05 ENCOUNTER — Ambulatory Visit: Payer: BLUE CROSS/BLUE SHIELD | Admitting: Internal Medicine

## 2014-09-17 ENCOUNTER — Ambulatory Visit: Payer: BLUE CROSS/BLUE SHIELD | Admitting: Internal Medicine

## 2014-10-01 ENCOUNTER — Ambulatory Visit: Payer: BLUE CROSS/BLUE SHIELD | Admitting: Internal Medicine

## 2014-10-12 ENCOUNTER — Other Ambulatory Visit: Payer: Self-pay | Admitting: Internal Medicine

## 2014-11-21 ENCOUNTER — Ambulatory Visit: Payer: BLUE CROSS/BLUE SHIELD | Admitting: Internal Medicine

## 2014-11-21 ENCOUNTER — Encounter (HOSPITAL_COMMUNITY): Payer: Self-pay | Admitting: *Deleted

## 2014-11-21 ENCOUNTER — Emergency Department (INDEPENDENT_AMBULATORY_CARE_PROVIDER_SITE_OTHER)
Admission: EM | Admit: 2014-11-21 | Discharge: 2014-11-21 | Disposition: A | Discharge status: Home / Self Care | Payer: Self-pay | Source: Home / Self Care | Attending: Family Medicine | Admitting: Family Medicine

## 2014-11-21 DIAGNOSIS — S39012A Strain of muscle, fascia and tendon of lower back, initial encounter: Secondary | ICD-10-CM

## 2014-11-21 MED ORDER — KETOROLAC TROMETHAMINE 60 MG/2ML IM SOLN
INTRAMUSCULAR | Status: AC
  Filled 2014-11-21: qty 2

## 2014-11-21 MED ORDER — KETOROLAC TROMETHAMINE 60 MG/2ML IM SOLN
60.0000 mg | Freq: Once | INTRAMUSCULAR | Status: AC
  Administered 2014-11-21: 60 mg via INTRAMUSCULAR

## 2014-11-21 NOTE — ED Notes (Signed)
Pt        [facility]  To  [facility]  For    Drug  Screen             Talked   With  Kateri McEmily    Charge  Nurse  Who  Will  Help him

## 2014-11-21 NOTE — Discharge Instructions (Signed)
Use your home medicine and see your doctor next week as planned, ok to work as scheduled.
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
## 2014-11-21 NOTE — ED Provider Notes (Signed)
CSN: [id number]     Arrival date & time 11/21/14  1642 History   First MD Initiated Contact with Patient 11/21/14 1723     No chief complaint on file.  (Consider location/radiation/quality/duration/timing/severity/associated sxs/prior Treatment) Patient is a 44 y.o. male presenting with back pain. The history is provided by the patient.  Back Pain Location:  Lumbar spine Quality:  Shooting Radiates to:  L knee and R knee Pain severity:  Moderate Onset quality:  Gradual Duration:  8 hours Progression:  Unchanged Chronicity:  Chronic Context: occupational injury   Context comment:  Trying to stop a car this am rolling backward with a child in it and back began hurting as before has chronic pain with MRI done last week and next week ortho plans to give injection. has plenty of meds at home.   Past Medical History  Diagnosis Date  . Allergy   . Hyperlipidemia   . Gout   . Seizure disorder   . H/O: CVA (cardiovascular accident)   . LBP (low back pain)   . Knee pain, bilateral   . Plantar fasciitis     followed by Dr Harriet PhoAjlouny  . Adrenal abnormality   . DM (diabetes mellitus)   . GERD (gastroesophageal reflux disease)   . OSA (obstructive sleep apnea)   . BPH (benign prostatic hypertrophy)    Past Surgical History  Procedure Laterality Date  . Right shoulder surgery      x2  . Left heel surgery      x2   Family History  Problem Relation Age of Onset  . Diabetes Father   . Hypertension Father   . Heart attack Maternal Grandfather   . Cancer Maternal Uncle   . Allergies Daughter    Social History  Substance Use Topics  . Smoking status: Former Smoker -- 4 years    Types: Cigarettes    Quit date: 12/22/1990  . Smokeless tobacco: Never Used     Comment: 1-2 packs a week  . Alcohol Use: No    Review of Systems  Constitutional: Negative.   Cardiovascular: Negative.   Gastrointestinal: Negative.   Genitourinary: Negative.   Musculoskeletal: Positive for myalgias  and back pain. Negative for joint swelling and gait problem.  Skin: Negative.   All other systems reviewed and are negative.   Allergies  Atorvastatin and Cymbalta  Home Medications   Prior to Admission medications   Medication Sig Start Date End Date Taking? Authorizing Provider  cyclobenzaprine (FLEXERIL) 10 MG tablet Take 1 tablet (10 mg total) by mouth as needed. 07/26/12   Doe-Hyun R Artist PaisYoo, DO  diclofenac sodium (VOLTAREN) 1 % GEL Apply 1 application topically 4 (four) times daily.    Historical Provider, MD  escitalopram (LEXAPRO) 20 MG tablet Take 20 mg by mouth daily. Once daily 01/07/14   Historical Provider, MD  HYDROcodone-acetaminophen (VICODIN) 5-500 MG per tablet Take 1 tablet by mouth every 8 (eight) hours as needed for pain. 04/13/12   Doe-Hyun R Artist PaisYoo, DO  lidocaine (XYLOCAINE) 5 % ointment Apply topically daily.    Historical Provider, MD  losartan (COZAAR) 25 MG tablet Take 1 tablet (25 mg total) by mouth daily. 03/19/14   Doe-Hyun R Artist PaisYoo, DO  losartan (COZAAR) 25 MG tablet TAKE 1 TABLET BY MOUTH EVERY DAY 10/13/14   Doe-Hyun R Artist PaisYoo, DO  metFORMIN (GLUCOPHAGE) 500 MG tablet TAKE 2 TABLETS BY MOUTH TWICE A DAY WITH A MEAL 10/13/14   Doe-Hyun R Artist PaisYoo, DO  methocarbamol (ROBAXIN) 500  MG tablet Take 500 mg by mouth every 6 (six) hours as needed for muscle spasms.    Historical Provider, MD  ondansetron (ZOFRAN) 4 MG tablet Take 1 tablet (4 mg total) by mouth every 6 (six) hours. 05/06/14   Oswaldo Conroy, PA-C  pantoprazole (PROTONIX) 40 MG tablet Take 1 tablet (40 mg total) by mouth daily. 05/13/14   Elwin Mocha, MD  Pediatric Multiple Vit-C-FA (PEDIATRIC MULTIVITAMIN) chewable tablet Chew 1 tablet by mouth daily.    Historical Provider, MD  ranitidine (ZANTAC) 150 MG capsule Take 1 capsule (150 mg total) by mouth daily. 05/06/14   Oswaldo Conroy, PA-C  rosuvastatin (CRESTOR) 20 MG tablet Take 1 tablet (20 mg total) by mouth daily. 03/19/14   Doe-Hyun R Artist Pais, DO  sucralfate (CARAFATE) 1 G  tablet Take 1 tablet (1 g total) by mouth 4 (four) times daily. 05/13/14   Elwin Mocha, MD  traMADol (ULTRAM) 50 MG tablet Take 100 mg by mouth every 6 (six) hours as needed for moderate pain.    Historical Provider, MD  traZODone (DESYREL) 100 MG tablet  05/09/14   Historical Provider, MD   Meds Ordered and Administered this Visit   Medications  ketorolac (TORADOL) injection 60 mg (not administered)    BP 148/68 mmHg  Pulse 89  Temp(Src) 98.5 F (36.9 C) (Oral)  SpO2 97% No data found.   Physical Exam  Constitutional: He is oriented to person, place, and time. He appears well-developed and well-nourished.  Abdominal: Soft. Bowel sounds are normal.  Musculoskeletal: He exhibits tenderness.       Lumbar back: He exhibits tenderness, bony tenderness, pain and spasm. He exhibits no swelling and normal pulse.       Back:  Neurological: He is alert and oriented to person, place, and time.  Skin: Skin is warm and dry.  Nursing note and vitals reviewed.   ED Course  Procedures (including critical care time)  Labs Review Labs Reviewed - No data to display  Imaging Review No results found.   Visual Acuity Review  Right Eye Distance:   Left Eye Distance:   Bilateral Distance:    Right Eye Near:   Left Eye Near:    Bilateral Near:         MDM   1. Low back strain, initial encounter    Given im toradol and f/u with his orthopedist next week for ongoing back care.    Linna Hoff, MD 11/21/14 [phone]

## 2015-01-16 ENCOUNTER — Ambulatory Visit: Payer: BC Managed Care – PPO | Admitting: Pulmonary Disease

## 2015-02-06 DIAGNOSIS — G5601 Carpal tunnel syndrome, right upper limb: Secondary | ICD-10-CM | POA: Insufficient documentation

## 2015-02-06 DIAGNOSIS — M47812 Spondylosis without myelopathy or radiculopathy, cervical region: Secondary | ICD-10-CM | POA: Insufficient documentation

## 2015-02-06 DIAGNOSIS — M65311 Trigger thumb, right thumb: Secondary | ICD-10-CM | POA: Insufficient documentation

## 2015-02-27 LAB — HM DIABETES EYE EXAM

## 2015-03-02 ENCOUNTER — Encounter: Payer: Self-pay | Admitting: Internal Medicine

## 2015-03-03 ENCOUNTER — Ambulatory Visit: Payer: BLUE CROSS/BLUE SHIELD | Admitting: Pulmonary Disease

## 2015-03-04 ENCOUNTER — Emergency Department (HOSPITAL_COMMUNITY)
Admission: EM | Admit: 2015-03-04 | Discharge: 2015-03-04 | Disposition: A | Discharge status: Home / Self Care | Payer: 59 | Attending: Emergency Medicine | Admitting: Emergency Medicine

## 2015-03-04 ENCOUNTER — Encounter (HOSPITAL_COMMUNITY): Payer: Self-pay | Admitting: Emergency Medicine

## 2015-03-04 ENCOUNTER — Emergency Department (HOSPITAL_COMMUNITY): Payer: 59

## 2015-03-04 DIAGNOSIS — Z8669 Personal history of other diseases of the nervous system and sense organs: Secondary | ICD-10-CM | POA: Insufficient documentation

## 2015-03-04 DIAGNOSIS — E669 Obesity, unspecified: Secondary | ICD-10-CM | POA: Insufficient documentation

## 2015-03-04 DIAGNOSIS — R079 Chest pain, unspecified: Secondary | ICD-10-CM

## 2015-03-04 DIAGNOSIS — Z8673 Personal history of transient ischemic attack (TIA), and cerebral infarction without residual deficits: Secondary | ICD-10-CM | POA: Diagnosis not present

## 2015-03-04 DIAGNOSIS — Z7984 Long term (current) use of oral hypoglycemic drugs: Secondary | ICD-10-CM | POA: Diagnosis not present

## 2015-03-04 DIAGNOSIS — E785 Hyperlipidemia, unspecified: Secondary | ICD-10-CM | POA: Diagnosis not present

## 2015-03-04 DIAGNOSIS — Z79899 Other long term (current) drug therapy: Secondary | ICD-10-CM | POA: Insufficient documentation

## 2015-03-04 DIAGNOSIS — Z87891 Personal history of nicotine dependence: Secondary | ICD-10-CM | POA: Insufficient documentation

## 2015-03-04 DIAGNOSIS — Z87438 Personal history of other diseases of male genital organs: Secondary | ICD-10-CM | POA: Diagnosis not present

## 2015-03-04 DIAGNOSIS — K219 Gastro-esophageal reflux disease without esophagitis: Secondary | ICD-10-CM | POA: Insufficient documentation

## 2015-03-04 DIAGNOSIS — Z791 Long term (current) use of non-steroidal anti-inflammatories (NSAID): Secondary | ICD-10-CM | POA: Insufficient documentation

## 2015-03-04 DIAGNOSIS — E119 Type 2 diabetes mellitus without complications: Secondary | ICD-10-CM | POA: Insufficient documentation

## 2015-03-04 DIAGNOSIS — Z8739 Personal history of other diseases of the musculoskeletal system and connective tissue: Secondary | ICD-10-CM | POA: Insufficient documentation

## 2015-03-04 LAB — BASIC METABOLIC PANEL
ANION GAP: 8 (ref 5–15)
BUN: 10 mg/dL (ref 6–20)
CALCIUM: 9.5 mg/dL (ref 8.9–10.3)
CO2: 28 mmol/L (ref 22–32)
Chloride: 104 mmol/L (ref 101–111)
Creatinine, Ser: 1.05 mg/dL (ref 0.61–1.24)
GFR calc Af Amer: >60 mL/min (ref >60)
GFR calc non Af Amer: >60 mL/min (ref >60)
GLUCOSE: 119 mg/dL — AB (ref 65–99)
POTASSIUM: 4.3 mmol/L (ref 3.5–5.1)
Sodium: 140 mmol/L (ref 135–145)

## 2015-03-04 LAB — CBC
HEMATOCRIT: 46.7 % (ref 39.0–52.0)
HEMOGLOBIN: 15.6 g/dL (ref 13.0–17.0)
MCH: 25.9 pg — AB (ref 26.0–34.0)
MCHC: 33.4 g/dL (ref 30.0–36.0)
MCV: 77.4 fL — ABNORMAL LOW (ref 78.0–100.0)
Platelets: 238 10*3/uL (ref 150–400)
RBC: 6.03 MIL/uL — ABNORMAL HIGH (ref 4.22–5.81)
RDW: 15.6 % — ABNORMAL HIGH (ref 11.5–15.5)
WBC: 5.3 10*3/uL (ref 4.0–10.5)

## 2015-03-04 LAB — I-STAT TROPONIN, ED
Troponin i, poc: 0 ng/mL (ref 0.00–0.08)
Troponin i, poc: 0 ng/mL (ref 0.00–0.08)

## 2015-03-04 MED ORDER — OXYCODONE-ACETAMINOPHEN 5-325 MG PO TABS
2.0000 | ORAL_TABLET | Freq: Once | ORAL | Status: AC
  Administered 2015-03-04: 2 via ORAL
  Filled 2015-03-04: qty 2

## 2015-03-04 NOTE — ED Notes (Signed)
Pt reports CP which started last night which he treated like indigestion with no relief. Pt reports the pain got worse today and now radiates to his neck and left arm. Pt alert x4. NAD at this time.

## 2015-03-04 NOTE — ED Provider Notes (Signed)
CSN: [id number]     Arrival date & time 03/04/15  1136 History   First MD Initiated Contact with Patient 03/04/15 1503     Chief Complaint  Patient presents with  . Chest Pain     (Consider location/radiation/quality/duration/timing/severity/associated sxs/prior Treatment) HPI   45 year old man history of hyperlipidemia, non-insulin-dependent diabetes, and seizure disorder who presents today complaining of chest discomfort. He describes it as burning and pressure in his chest which began last night at approximately 8:30. He has taken Robaxin and gastric acid inhibitors without relief. The pain has been constant increasing up to 8 out of 10. The pain is worse with movement and deep breathing. He does not have any associated symptoms. He has not had any similar episodes in the past. He has no known cardiac history. He has no history of blood clots in his legs or lungs. He denies any fever or productive cough. He is not a smoker.  Past Medical History  Diagnosis Date  . Allergy   . Hyperlipidemia   . Gout   . Seizure disorder (HCC)   . H/O: CVA (cardiovascular accident)   . LBP (low back pain)   . Knee pain, bilateral   . Plantar fasciitis     followed by Dr Harriet Pho  . Adrenal abnormality (HCC)   . DM (diabetes mellitus) (HCC)   . GERD (gastroesophageal reflux disease)   . OSA (obstructive sleep apnea)   . BPH (benign prostatic hypertrophy)    Past Surgical History  Procedure Laterality Date  . Right shoulder surgery      x2  . Left heel surgery      x2   Family History  Problem Relation Age of Onset  . Diabetes Father   . Hypertension Father   . Heart attack Maternal Grandfather   . Cancer Maternal Uncle   . Allergies Daughter    Social History  Substance Use Topics  . Smoking status: Former Smoker -- 4 years    Types: Cigarettes    Quit date: 12/22/1990  . Smokeless tobacco: Never Used     Comment: 1-2 packs a week  . Alcohol Use: No    Review of Systems  All  other systems reviewed and are negative.     Allergies  Atorvastatin and Cymbalta  Home Medications   Prior to Admission medications   Medication Sig Start Date End Date Taking? Authorizing Provider  cyclobenzaprine (FLEXERIL) 10 MG tablet Take 1 tablet (10 mg total) by mouth as needed. 07/26/12   Doe-Hyun R Artist Pais, DO  diclofenac sodium (VOLTAREN) 1 % GEL Apply 1 application topically 4 (four) times daily.    Historical Provider, MD  escitalopram (LEXAPRO) 20 MG tablet Take 20 mg by mouth daily. Once daily 01/07/14   Historical Provider, MD  HYDROcodone-acetaminophen (VICODIN) 5-500 MG per tablet Take 1 tablet by mouth every 8 (eight) hours as needed for pain. 04/13/12   Doe-Hyun R Artist Pais, DO  lidocaine (XYLOCAINE) 5 % ointment Apply topically daily.    Historical Provider, MD  losartan (COZAAR) 25 MG tablet Take 1 tablet (25 mg total) by mouth daily. 03/19/14   Doe-Hyun R Artist Pais, DO  losartan (COZAAR) 25 MG tablet TAKE 1 TABLET BY MOUTH EVERY DAY 10/13/14   Doe-Hyun R Artist Pais, DO  metFORMIN (GLUCOPHAGE) 500 MG tablet TAKE 2 TABLETS BY MOUTH TWICE A DAY WITH A MEAL 10/13/14   Doe-Hyun R Artist Pais, DO  methocarbamol (ROBAXIN) 500 MG tablet Take 500 mg by mouth every 6 (six) hours  as needed for muscle spasms.    Historical Provider, MD  ondansetron (ZOFRAN) 4 MG tablet Take 1 tablet (4 mg total) by mouth every 6 (six) hours. 05/06/14   Oswaldo Conroy, PA-C  pantoprazole (PROTONIX) 40 MG tablet Take 1 tablet (40 mg total) by mouth daily. 05/13/14   Elwin Mocha, MD  Pediatric Multiple Vit-C-FA (PEDIATRIC MULTIVITAMIN) chewable tablet Chew 1 tablet by mouth daily.    Historical Provider, MD  ranitidine (ZANTAC) 150 MG capsule Take 1 capsule (150 mg total) by mouth daily. 05/06/14   Oswaldo Conroy, PA-C  rosuvastatin (CRESTOR) 20 MG tablet Take 1 tablet (20 mg total) by mouth daily. 03/19/14   Doe-Hyun R Artist Pais, DO  sucralfate (CARAFATE) 1 G tablet Take 1 tablet (1 g total) by mouth 4 (four) times daily. 05/13/14   Elwin Mocha, MD  traMADol (ULTRAM) 50 MG tablet Take 100 mg by mouth every 6 (six) hours as needed for moderate pain.    Historical Provider, MD  traZODone (DESYREL) 100 MG tablet  05/09/14   Historical Provider, MD   BP 123/78 mmHg  Pulse 81  Temp(Src) 98 F (36.7 C) (Oral)  Resp 22  SpO2 99% Physical Exam  Constitutional: He is oriented to person, place, and time. He appears well-developed and well-nourished.  Obese  HENT:  Head: Normocephalic and atraumatic.  Right Ear: External ear normal.  Left Ear: External ear normal.  Nose: Nose normal.  Mouth/Throat: Oropharynx is clear and moist.  Eyes: Conjunctivae and EOM are normal. Pupils are equal, round, and reactive to light.  Neck: Normal range of motion. Neck supple.  Cardiovascular: Normal rate, regular rhythm, normal heart sounds and intact distal pulses.   Pulmonary/Chest: Effort normal and breath sounds normal. No respiratory distress. He has no wheezes. He exhibits tenderness.    Abdominal: Soft. Bowel sounds are normal. He exhibits no distension and no mass. There is no tenderness. There is no guarding.  Musculoskeletal: Normal range of motion.  Neurological: He is alert and oriented to person, place, and time. He has normal reflexes. He exhibits normal muscle tone. Coordination normal.  Skin: Skin is warm and dry.  Psychiatric: He has a normal mood and affect. His behavior is normal. Judgment and thought content normal.  Nursing note and vitals reviewed.   ED Course  Procedures (including critical care time) Labs Review Labs Reviewed  BASIC METABOLIC PANEL - Abnormal; Notable for the following:    Glucose, Bld 119 (*)    All other components within normal limits  CBC - Abnormal; Notable for the following:    RBC 6.03 (*)    MCV 77.4 (*)    MCH 25.9 (*)    RDW 15.6 (*)    All other components within normal limits  I-STAT TROPOININ, ED  I-STAT TROPOININ, ED    Imaging Review Dg Chest 2 View  03/04/2015  CLINICAL  DATA:  LEFT side chest pain traveling to LEFT arm, causing LEFT side neck stiffness, some shortness of breath last night, history diabetes mellitus, GERD EXAM: CHEST  2 VIEW COMPARISON:  None. FINDINGS: Borderline enlargement of cardiac silhouette. Mediastinal contours and pulmonary vascularity grossly normal for degree of inflation. Hypoinflated lungs with mild bibasilar atelectasis. No pulmonary infiltrate, pleural effusion or pneumothorax. Bones unremarkable. IMPRESSION: Bibasilar atelectasis. Electronically Signed   By: Ulyses Southward M.D.   On: 03/04/2015 12:47   I have personally reviewed and evaluated these images and lab results as part of my medical decision-making.   EKG Interpretation  Date/Time:  Wednesday March 04 2015 11:43:13 EST Ventricular Rate:  89 PR Interval:  160 QRS Duration: 94 QT Interval:  356 QTC Calculation: 433 R Axis:   58 Text Interpretation:  Normal sinus rhythm Normal ECG Confirmed by Braedyn Kauk MD,  Duwayne Heck ([id number]) on 03/04/2015 3:03:49 PM      MDM   Final diagnoses:  Chest pain, unspecified chest pain type      45 year old man with chest pain present for 24 hours that is worse with movement. He has had evaluation here including Dr. troponins remained normal. EKG is normal. I discussed return precautions and need for follow the patient voices understanding.  Margarita Grizzle, MD 03/04/15 [phone]

## 2015-03-04 NOTE — Discharge Instructions (Signed)

## 2015-03-05 ENCOUNTER — Encounter: Payer: Self-pay | Admitting: Cardiovascular Disease

## 2015-03-05 ENCOUNTER — Ambulatory Visit (INDEPENDENT_AMBULATORY_CARE_PROVIDER_SITE_OTHER): Payer: 59 | Admitting: Cardiovascular Disease

## 2015-03-05 DIAGNOSIS — Z79899 Other long term (current) drug therapy: Secondary | ICD-10-CM | POA: Diagnosis not present

## 2015-03-05 DIAGNOSIS — R079 Chest pain, unspecified: Secondary | ICD-10-CM

## 2015-03-05 DIAGNOSIS — E785 Hyperlipidemia, unspecified: Secondary | ICD-10-CM | POA: Diagnosis not present

## 2015-03-05 DIAGNOSIS — R0789 Other chest pain: Secondary | ICD-10-CM | POA: Insufficient documentation

## 2015-03-05 MED ORDER — ROSUVASTATIN CALCIUM 20 MG PO TABS: 20.0000 mg | ORAL_TABLET | Freq: Every day | ORAL | Status: DC

## 2015-03-05 NOTE — Patient Instructions (Signed)
Your physician has requested that you have en exercise stress myoview. For further information please visit [email or web address] Please follow instruction sheet, as given.  Your physician recommends that you schedule a follow-up appointment as needed basib if test is negative, if positive  Will need an appointment.   Start generic Crestor 20 mg daily at bedtime.  In 2 months please have lab done --LIPIDS, LIVER FUNCTION TEST-- DO NOT EAT THE MORNING OF THE TEST.

## 2015-03-05 NOTE — Assessment & Plan Note (Signed)
Mr. [name] is referred by Dr. Rosalia Hammers at the [facility] emergency room for evaluation of atypical chest pain.his risk factors include type 2 diabetes, hyperlipidemia. He said 2 days of constant left neck shoulder and chest pain which is somewhat positional. He was briefly associated with diaphoresis and shortness of breath and there was a pleuritic component as well. His EKG in the emergency room was normal and his troponins were negative. He was discharged home. He does have a history of GERD. Based on his history I suspect that his symptoms are musculoskeletal. I am going to obtain an exercise Myoview stress test because of his risk factors however.

## 2015-03-05 NOTE — Assessment & Plan Note (Signed)
History of obstructive sleep apnea on C Pap which he benefits from 

## 2015-03-05 NOTE — Assessment & Plan Note (Signed)
History of hyperlipidemia on statin therapy in the past with his most recent lipid profile performed 02/07/14 revealing total cholesterol 249, LDL 182 and HDL of 39. I'm going to re-prescribe his Crestor 20 mg a day and will check a lipid and liver profile

## 2015-03-05 NOTE — Progress Notes (Signed)
03/05/2015 [name]   [birth year]  [id number]  Primary Physician Thomos Lemons, DO Primary Cardiologist: Runell Gess MD Roseanne Reno   HPI:  Mr. Deiss is a delightful 45 year old moderate to severely overweight married African-American male father of 2 daughters who is accompanied by his wife Joni Reining today. He was referred by Dr. Rosalia Hammers from the [facility] emergency room for evaluation of atypical chest pain. 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 His risk factors include treated type 2 diabetes as well as hyperlipidemia on statins in the past. He has obstructive sleep apnea on CPAP. He's has no family history of heart disease as never had a heart attack but did have a stroke at age 47 for unclear reasons. He also has GERD. He developed left upper chest and neck and shoulder pain on Tuesday night which has been constant, somewhat positional and pleuritic. He was evaluated in the emergency room where an EKG showed no acute changes. A chest x-ray was normal and his troponins were negative.    Current Outpatient Prescriptions  Medication Sig Dispense Refill  . diclofenac sodium (VOLTAREN) 1 % GEL Apply 1 application topically 4 (four) times daily.    Marland Kitchen escitalopram (LEXAPRO) 20 MG tablet Take 20 mg by mouth daily. Once daily  0  . losartan (COZAAR) 25 MG tablet TAKE 1 TABLET BY MOUTH EVERY DAY 90 tablet 1  . metFORMIN (GLUCOPHAGE) 500 MG tablet TAKE 2 TABLETS BY MOUTH TWICE A DAY WITH A MEAL 180 tablet 1  . methocarbamol (ROBAXIN) 500 MG tablet Take 500 mg by mouth every 6 (six) hours as needed for muscle spasms.    . Pediatric Multiple Vit-C-FA (PEDIATRIC MULTIVITAMIN) chewable tablet Chew 1 tablet by mouth daily.    . rosuvastatin (CRESTOR) 20 MG tablet Take 1 tablet (20 mg total) by mouth daily. 90 tablet 3  . traMADol (ULTRAM) 50 MG tablet Take 100 mg by mouth every 6 (six) hours as needed for moderate pain.    . traZODone  (DESYREL) 100 MG tablet Take 100 mg by mouth at bedtime.   0   No current facility-administered medications for this visit.    Allergies  Allergen Reactions  . Atorvastatin Swelling    Tingling, numbness.    Marland Kitchen Cymbalta [Duloxetine Hcl]     Stomach upset, difficulty urinating, decreased libido    Social History   Social History  . Marital Status: Married    Spouse Name: N/A  . Number of Children: 2  . Years of Education: N/A   Occupational History  . Production designer, theatre/television/film and clergy    Social History Main Topics  . Smoking status: Former Smoker -- 4 years    Types: Cigarettes    Quit date: 12/22/1990  . Smokeless tobacco: Never Used     Comment: 1-2 packs a week  . Alcohol Use: No  . Drug Use: No  . Sexual Activity: Not on file   Other Topics Concern  . Not on file   Social History Narrative   Works for [employer]     Review of Systems: General: negative for chills, fever, night sweats or weight changes.  Cardiovascular: negative for chest pain, dyspnea on exertion, edema, orthopnea, palpitations, paroxysmal nocturnal dyspnea or shortness of breath Dermatological: negative for rash Respiratory: negative for cough or wheezing Urologic: negative for hematuria Abdominal: negative for nausea, vomiting, diarrhea, bright red blood per rectum, melena, or hematemesis Neurologic:  negative for visual changes, syncope, or dizziness All other systems reviewed and are otherwise negative except as noted above.    Blood pressure 112/76, pulse 76, height  (1.778 m), weight 274 lb 8 oz (124.512 kg).  General appearance: alert and no distress Neck: no adenopathy, no carotid bruit, no JVD, supple, symmetrical, trachea midline and thyroid not enlarged, symmetric, no tenderness/mass/nodules Lungs: clear to auscultation bilaterally Heart: regular rate and rhythm, S1, S2 normal, no murmur, click, rub or gallop Extremities: extremities normal, atraumatic, no cyanosis or  edema  EKG /76 with nonspecific ST and T-wave changes. I personally reviewed his EKG  ASSESSMENT AND PLAN:   HYPERLIPIDEMIA History of hyperlipidemia on statin therapy in the past with his most recent lipid profile performed 02/07/14 revealing total cholesterol 249, LDL 182 and HDL of 39. I'm going to re-prescribe his Crestor 20 mg a day and will check a lipid and liver profile  Obstructive sleep apnea History of obstructive sleep apnea on C Pap which he benefits from  Atypical chest pain Mr. Russett is referred by Dr. Rosalia Hammers at the [facility] emergency room for evaluation of atypical chest pain.his risk factors include type 2 diabetes, hyperlipidemia. He said 2 days of constant left neck shoulder and chest pain which is somewhat positional. He was briefly associated with diaphoresis and shortness of breath and there was a pleuritic component as well. His EKG in the emergency room was normal and his troponins were negative. He was discharged home. He does have a history of GERD. Based on his history I suspect that his symptoms are musculoskeletal. I am going to obtain an exercise Myoview stress test because of his risk factors however.      Runell Gess MD FACP,FACC,FAHA, [facility] 03/05/2015 1:36 PM

## 2015-03-06 ENCOUNTER — Telehealth (HOSPITAL_COMMUNITY): Payer: Self-pay | Admitting: *Deleted

## 2015-03-06 NOTE — Telephone Encounter (Signed)
Patient given detailed instructions per Myocardial Perfusion Study Information Sheet for the test on 03/10/15 at 12:45. Patient notified to arrive 15 minutes early and that it is imperative to arrive on time for appointment to keep from having the test rescheduled.  If you need to cancel or reschedule your appointment, please call the office within 24 hours of your appointment. Failure to do so may result in a cancellation of your appointment, and a $50 no show fee. Patient verbalized understanding.Daneil Dolin

## 2015-03-09 ENCOUNTER — Ambulatory Visit: Payer: 59 | Admitting: Cardiology

## 2015-03-10 ENCOUNTER — Ambulatory Visit (HOSPITAL_COMMUNITY): Payer: 59 | Attending: Cardiovascular Disease

## 2015-03-10 DIAGNOSIS — Z79899 Other long term (current) drug therapy: Secondary | ICD-10-CM | POA: Insufficient documentation

## 2015-03-10 DIAGNOSIS — E785 Hyperlipidemia, unspecified: Secondary | ICD-10-CM | POA: Insufficient documentation

## 2015-03-10 DIAGNOSIS — E119 Type 2 diabetes mellitus without complications: Secondary | ICD-10-CM | POA: Insufficient documentation

## 2015-03-10 DIAGNOSIS — R0609 Other forms of dyspnea: Secondary | ICD-10-CM | POA: Insufficient documentation

## 2015-03-10 DIAGNOSIS — R079 Chest pain, unspecified: Secondary | ICD-10-CM | POA: Diagnosis not present

## 2015-03-10 DIAGNOSIS — R0602 Shortness of breath: Secondary | ICD-10-CM | POA: Insufficient documentation

## 2015-03-10 MED ORDER — TECHNETIUM TC 99M SESTAMIBI GENERIC - CARDIOLITE
32.8000 | Freq: Once | INTRAVENOUS | Status: AC | PRN
  Administered 2015-03-10: 32.8 via INTRAVENOUS

## 2015-03-11 ENCOUNTER — Ambulatory Visit (HOSPITAL_COMMUNITY): Payer: 59 | Attending: Cardiovascular Disease

## 2015-03-11 LAB — MYOCARDIAL PERFUSION IMAGING
CSEPEDS: 30 s
CSEPEW: 10.4 METS
Exercise duration (min): 8 min
LHR: 0.42
LV dias vol: 108 mL
LVSYSVOL: 50 mL
MPHR: 175 {beats}/min
NUC STRESS TID: 0.87
Peak HR: 155 {beats}/min
Percent HR: 88 %
Rest HR: 84 {beats}/min
SDS: 1
SRS: 0
SSS: 1

## 2015-03-11 MED ORDER — TECHNETIUM TC 99M SESTAMIBI GENERIC - CARDIOLITE
32.7000 | Freq: Once | INTRAVENOUS | Status: AC | PRN
  Administered 2015-03-11: 32.7 via INTRAVENOUS

## 2015-05-05 ENCOUNTER — Other Ambulatory Visit: Payer: Self-pay | Admitting: Orthopedic Surgery

## 2015-05-11 ENCOUNTER — Encounter (HOSPITAL_BASED_OUTPATIENT_CLINIC_OR_DEPARTMENT_OTHER)
Admission: RE | Admit: 2015-05-11 | Discharge: 2015-05-11 | Disposition: A | Discharge status: Home / Self Care | Payer: 59 | Source: Ambulatory Visit | Attending: Orthopedic Surgery | Admitting: Orthopedic Surgery

## 2015-05-11 DIAGNOSIS — N400 Enlarged prostate without lower urinary tract symptoms: Secondary | ICD-10-CM | POA: Diagnosis not present

## 2015-05-11 DIAGNOSIS — Z7984 Long term (current) use of oral hypoglycemic drugs: Secondary | ICD-10-CM | POA: Diagnosis not present

## 2015-05-11 DIAGNOSIS — E119 Type 2 diabetes mellitus without complications: Secondary | ICD-10-CM | POA: Diagnosis not present

## 2015-05-11 DIAGNOSIS — K219 Gastro-esophageal reflux disease without esophagitis: Secondary | ICD-10-CM | POA: Diagnosis not present

## 2015-05-11 DIAGNOSIS — E785 Hyperlipidemia, unspecified: Secondary | ICD-10-CM | POA: Diagnosis not present

## 2015-05-11 DIAGNOSIS — Z79899 Other long term (current) drug therapy: Secondary | ICD-10-CM | POA: Diagnosis not present

## 2015-05-11 DIAGNOSIS — Z8673 Personal history of transient ischemic attack (TIA), and cerebral infarction without residual deficits: Secondary | ICD-10-CM | POA: Diagnosis not present

## 2015-05-11 DIAGNOSIS — Z87891 Personal history of nicotine dependence: Secondary | ICD-10-CM | POA: Diagnosis not present

## 2015-05-11 DIAGNOSIS — Z888 Allergy status to other drugs, medicaments and biological substances status: Secondary | ICD-10-CM | POA: Diagnosis not present

## 2015-05-11 DIAGNOSIS — G4733 Obstructive sleep apnea (adult) (pediatric): Secondary | ICD-10-CM | POA: Diagnosis not present

## 2015-05-11 DIAGNOSIS — G40909 Epilepsy, unspecified, not intractable, without status epilepticus: Secondary | ICD-10-CM | POA: Diagnosis not present

## 2015-05-11 DIAGNOSIS — M65311 Trigger thumb, right thumb: Secondary | ICD-10-CM | POA: Diagnosis not present

## 2015-05-11 DIAGNOSIS — M109 Gout, unspecified: Secondary | ICD-10-CM | POA: Diagnosis not present

## 2015-05-11 LAB — BASIC METABOLIC PANEL
Anion gap: 9 (ref 5–15)
BUN: 12 mg/dL (ref 6–20)
CALCIUM: 9.1 mg/dL (ref 8.9–10.3)
CO2: 26 mmol/L (ref 22–32)
CREATININE: 0.99 mg/dL (ref 0.61–1.24)
Chloride: 108 mmol/L (ref 101–111)
GFR calc Af Amer: >60 mL/min (ref >60)
GLUCOSE: 106 mg/dL — AB (ref 65–99)
Potassium: 4.5 mmol/L (ref 3.5–5.1)
SODIUM: 143 mmol/L (ref 135–145)

## 2015-05-14 ENCOUNTER — Ambulatory Visit (HOSPITAL_BASED_OUTPATIENT_CLINIC_OR_DEPARTMENT_OTHER): Payer: 59 | Admitting: Anesthesiology

## 2015-05-14 ENCOUNTER — Ambulatory Visit (HOSPITAL_BASED_OUTPATIENT_CLINIC_OR_DEPARTMENT_OTHER)
Admission: RE | Admit: 2015-05-14 | Discharge: 2015-05-14 | Disposition: A | Discharge status: Home / Self Care | Payer: 59 | Source: Ambulatory Visit | Attending: Orthopedic Surgery | Admitting: Orthopedic Surgery

## 2015-05-14 ENCOUNTER — Encounter (HOSPITAL_BASED_OUTPATIENT_CLINIC_OR_DEPARTMENT_OTHER)
Admission: RE | Disposition: A | Discharge status: Home / Self Care | Payer: Self-pay | Source: Ambulatory Visit | Attending: Orthopedic Surgery

## 2015-05-14 ENCOUNTER — Other Ambulatory Visit: Payer: Self-pay | Admitting: Internal Medicine

## 2015-05-14 ENCOUNTER — Encounter (HOSPITAL_BASED_OUTPATIENT_CLINIC_OR_DEPARTMENT_OTHER): Payer: Self-pay

## 2015-05-14 DIAGNOSIS — E119 Type 2 diabetes mellitus without complications: Secondary | ICD-10-CM | POA: Insufficient documentation

## 2015-05-14 DIAGNOSIS — M109 Gout, unspecified: Secondary | ICD-10-CM | POA: Insufficient documentation

## 2015-05-14 DIAGNOSIS — E785 Hyperlipidemia, unspecified: Secondary | ICD-10-CM | POA: Insufficient documentation

## 2015-05-14 DIAGNOSIS — M65311 Trigger thumb, right thumb: Secondary | ICD-10-CM | POA: Diagnosis not present

## 2015-05-14 DIAGNOSIS — Z87891 Personal history of nicotine dependence: Secondary | ICD-10-CM | POA: Insufficient documentation

## 2015-05-14 DIAGNOSIS — Z8673 Personal history of transient ischemic attack (TIA), and cerebral infarction without residual deficits: Secondary | ICD-10-CM | POA: Insufficient documentation

## 2015-05-14 DIAGNOSIS — N400 Enlarged prostate without lower urinary tract symptoms: Secondary | ICD-10-CM | POA: Insufficient documentation

## 2015-05-14 DIAGNOSIS — G4733 Obstructive sleep apnea (adult) (pediatric): Secondary | ICD-10-CM | POA: Insufficient documentation

## 2015-05-14 DIAGNOSIS — G40909 Epilepsy, unspecified, not intractable, without status epilepticus: Secondary | ICD-10-CM | POA: Insufficient documentation

## 2015-05-14 DIAGNOSIS — Z888 Allergy status to other drugs, medicaments and biological substances status: Secondary | ICD-10-CM | POA: Insufficient documentation

## 2015-05-14 DIAGNOSIS — Z7984 Long term (current) use of oral hypoglycemic drugs: Secondary | ICD-10-CM | POA: Insufficient documentation

## 2015-05-14 DIAGNOSIS — Z79899 Other long term (current) drug therapy: Secondary | ICD-10-CM | POA: Insufficient documentation

## 2015-05-14 DIAGNOSIS — K219 Gastro-esophageal reflux disease without esophagitis: Secondary | ICD-10-CM | POA: Insufficient documentation

## 2015-05-14 HISTORY — PX: TRIGGER FINGER RELEASE: SHX641

## 2015-05-14 LAB — GLUCOSE, CAPILLARY
Glucose-Capillary: 86 mg/dL (ref 65–99)
Glucose-Capillary: 86 mg/dL (ref 65–99)

## 2015-05-14 SURGERY — RELEASE, A1 PULLEY, FOR TRIGGER FINGER
Anesthesia: Regional | Site: Thumb | Laterality: Right

## 2015-05-14 MED ORDER — LIDOCAINE HCL (CARDIAC) 20 MG/ML IV SOLN
INTRAVENOUS | Status: DC | PRN
  Administered 2015-05-14: 50 mg via INTRAVENOUS

## 2015-05-14 MED ORDER — LACTATED RINGERS IV SOLN
INTRAVENOUS | Status: DC
  Administered 2015-05-14: 09:00:00 via INTRAVENOUS

## 2015-05-14 MED ORDER — CHLORHEXIDINE GLUCONATE 4 % EX LIQD: 60.0000 mL | Freq: Once | CUTANEOUS | Status: DC

## 2015-05-14 MED ORDER — MEPERIDINE HCL 25 MG/ML IJ SOLN: 6.2500 mg | INTRAMUSCULAR | Status: DC | PRN

## 2015-05-14 MED ORDER — HYDROCODONE-ACETAMINOPHEN 5-325 MG PO TABS: ORAL_TABLET | ORAL | Status: DC

## 2015-05-14 MED ORDER — MIDAZOLAM HCL 2 MG/2ML IJ SOLN
INTRAMUSCULAR | Status: AC
  Filled 2015-05-14: qty 2

## 2015-05-14 MED ORDER — OXYCODONE HCL 5 MG PO TABS
ORAL_TABLET | ORAL | Status: AC
  Filled 2015-05-14: qty 1

## 2015-05-14 MED ORDER — PROPOFOL 500 MG/50ML IV EMUL
INTRAVENOUS | Status: DC | PRN
  Administered 2015-05-14: 50 ug/kg/min via INTRAVENOUS

## 2015-05-14 MED ORDER — ONDANSETRON HCL 4 MG/2ML IJ SOLN
INTRAMUSCULAR | Status: AC
  Filled 2015-05-14: qty 2

## 2015-05-14 MED ORDER — GLYCOPYRROLATE 0.2 MG/ML IJ SOLN: 0.2000 mg | Freq: Once | INTRAMUSCULAR | Status: DC | PRN

## 2015-05-14 MED ORDER — FENTANYL CITRATE (PF) 100 MCG/2ML IJ SOLN
INTRAMUSCULAR | Status: AC
  Filled 2015-05-14: qty 2

## 2015-05-14 MED ORDER — ONDANSETRON HCL 4 MG/2ML IJ SOLN
INTRAMUSCULAR | Status: DC | PRN
  Administered 2015-05-14: 4 mg via INTRAVENOUS

## 2015-05-14 MED ORDER — MIDAZOLAM HCL 2 MG/2ML IJ SOLN
1.0000 mg | INTRAMUSCULAR | Status: DC | PRN
  Administered 2015-05-14 (×2): 1 mg via INTRAVENOUS

## 2015-05-14 MED ORDER — FENTANYL CITRATE (PF) 100 MCG/2ML IJ SOLN
50.0000 ug | INTRAMUSCULAR | Status: DC | PRN
  Administered 2015-05-14: 50 ug via INTRAVENOUS

## 2015-05-14 MED ORDER — FENTANYL CITRATE (PF) 100 MCG/2ML IJ SOLN
25.0000 ug | INTRAMUSCULAR | Status: DC | PRN
  Administered 2015-05-14: 50 ug via INTRAVENOUS

## 2015-05-14 MED ORDER — SCOPOLAMINE 1 MG/3DAYS TD PT72: 1.0000 | MEDICATED_PATCH | Freq: Once | TRANSDERMAL | Status: DC | PRN

## 2015-05-14 MED ORDER — METOCLOPRAMIDE HCL 5 MG/ML IJ SOLN: 10.0000 mg | Freq: Once | INTRAMUSCULAR | Status: DC | PRN

## 2015-05-14 MED ORDER — BUPIVACAINE HCL (PF) 0.25 % IJ SOLN
INTRAMUSCULAR | Status: DC | PRN
  Administered 2015-05-14: 5 mL

## 2015-05-14 MED ORDER — LACTATED RINGERS IV SOLN: INTRAVENOUS | Status: DC

## 2015-05-14 MED ORDER — CEFAZOLIN SODIUM-DEXTROSE 2-4 GM/100ML-% IV SOLN
2.0000 g | INTRAVENOUS | Status: AC
  Administered 2015-05-14: 2 g via INTRAVENOUS

## 2015-05-14 MED ORDER — CEFAZOLIN SODIUM-DEXTROSE 2-4 GM/100ML-% IV SOLN
INTRAVENOUS | Status: AC
  Filled 2015-05-14: qty 100

## 2015-05-14 MED ORDER — PROPOFOL 500 MG/50ML IV EMUL
INTRAVENOUS | Status: AC
  Filled 2015-05-14: qty 50

## 2015-05-14 MED ORDER — OXYCODONE HCL 5 MG PO TABS
5.0000 mg | ORAL_TABLET | Freq: Once | ORAL | Status: AC
  Administered 2015-05-14: 5 mg via ORAL

## 2015-05-14 SURGICAL SUPPLY — 35 items
BANDAGE COBAN STERILE 2 (GAUZE/BANDAGES/DRESSINGS) ×2 IMPLANT
BLADE MINI RND TIP GREEN BEAV (BLADE) IMPLANT
BLADE SURG 15 STRL LF DISP TIS (BLADE) ×2 IMPLANT
BLADE SURG 15 STRL SS (BLADE) ×2
BNDG CONFORM 2 STRL LF (GAUZE/BANDAGES/DRESSINGS) ×2 IMPLANT
BNDG ESMARK 4X9 LF (GAUZE/BANDAGES/DRESSINGS) IMPLANT
CHLORAPREP W/TINT 26ML (MISCELLANEOUS) ×2 IMPLANT
CORDS BIPOLAR (ELECTRODE) ×2 IMPLANT
COVER BACK TABLE 60X90IN (DRAPES) ×2 IMPLANT
COVER MAYO STAND STRL (DRAPES) ×2 IMPLANT
CUFF TOURNIQUET SINGLE 18IN (TOURNIQUET CUFF) ×2 IMPLANT
DRAPE EXTREMITY T 121X128X90 (DRAPE) ×2 IMPLANT
DRAPE SURG 17X23 STRL (DRAPES) ×2 IMPLANT
GAUZE SPONGE 4X4 12PLY STRL (GAUZE/BANDAGES/DRESSINGS) ×2 IMPLANT
GAUZE XEROFORM 1X8 LF (GAUZE/BANDAGES/DRESSINGS) ×2 IMPLANT
GLOVE BIO SURGEON STRL SZ7.5 (GLOVE) ×2 IMPLANT
GLOVE BIOGEL PI IND STRL 7.0 (GLOVE) ×2 IMPLANT
GLOVE BIOGEL PI IND STRL 8 (GLOVE) ×1 IMPLANT
GLOVE BIOGEL PI INDICATOR 7.0 (GLOVE) ×2
GLOVE BIOGEL PI INDICATOR 8 (GLOVE) ×1
GLOVE ECLIPSE 6.5 STRL STRAW (GLOVE) ×2 IMPLANT
GOWN STRL REIN XL XLG (GOWN DISPOSABLE) ×2 IMPLANT
GOWN STRL REUS W/ TWL LRG LVL3 (GOWN DISPOSABLE) ×1 IMPLANT
GOWN STRL REUS W/TWL LRG LVL3 (GOWN DISPOSABLE) ×1
NEEDLE HYPO 25X1 1.5 SAFETY (NEEDLE) ×2 IMPLANT
NS IRRIG 1000ML POUR BTL (IV SOLUTION) ×2 IMPLANT
PACK BASIN DAY SURGERY FS (CUSTOM PROCEDURE TRAY) ×2 IMPLANT
PADDING CAST ABS 4INX4YD NS (CAST SUPPLIES) ×1
PADDING CAST ABS COTTON 4X4 ST (CAST SUPPLIES) ×1 IMPLANT
STOCKINETTE 4X48 STRL (DRAPES) ×2 IMPLANT
SUT ETHILON 4 0 PS 2 18 (SUTURE) ×2 IMPLANT
SYR BULB 3OZ (MISCELLANEOUS) ×2 IMPLANT
SYR CONTROL 10ML LL (SYRINGE) ×2 IMPLANT
TOWEL OR 17X24 6PK STRL BLUE (TOWEL DISPOSABLE) ×4 IMPLANT
UNDERPAD 30X30 (UNDERPADS AND DIAPERS) ×2 IMPLANT

## 2015-05-14 NOTE — H&P (Signed)
[name] is an 45 y.o. male.   Chief Complaint: right thumb trigger digit HPI: 45 yo male with triggering right thumb x 3 months.  This has been injected without lasting relief.  He wishes to have a trigger release for management of the symptoms.    Allergies:  Allergies  Allergen Reactions  . Atorvastatin Swelling    Tingling, numbness.    Marland Kitchen Cymbalta [Duloxetine Hcl]     Stomach upset, difficulty urinating, decreased libido    Past Medical History  Diagnosis Date  . Allergy   . Hyperlipidemia   . Gout   . Seizure disorder (HCC)   . H/O: CVA (cardiovascular accident)   . LBP (low back pain)   . Knee pain, bilateral   . Plantar fasciitis     followed by Dr Harriet Pho  . Adrenal abnormality (HCC)   . DM (diabetes mellitus) (HCC)   . GERD (gastroesophageal reflux disease)   . OSA (obstructive sleep apnea)   . BPH (benign prostatic hypertrophy)   . Atypical chest pain     Past Surgical History  Procedure Laterality Date  . Right shoulder surgery      x2  . Left heel surgery      x2    Family History: Family History  Problem Relation Age of Onset  . Diabetes Father   . Hypertension Father   . Heart attack Maternal Grandfather   . Cancer Maternal Uncle   . Allergies Daughter     Social History:   reports that he quit smoking about 24 years ago. His smoking use included Cigarettes. He quit after 4 years of use. He has never used smokeless tobacco. He reports that he does not drink alcohol or use illicit drugs.  Medications: Medications Prior to Admission  Medication Sig Dispense Refill  . diclofenac sodium (VOLTAREN) 1 % GEL Apply 1 application topically 4 (four) times daily.    Marland Kitchen escitalopram (LEXAPRO) 20 MG tablet Take 20 mg by mouth daily. Once daily  0  . losartan (COZAAR) 25 MG tablet TAKE 1 TABLET BY MOUTH EVERY DAY 90 tablet 1  . metFORMIN (GLUCOPHAGE) 500 MG tablet TAKE 2 TABLETS BY MOUTH TWICE A DAY WITH A MEAL 180 tablet 1  . methocarbamol  (ROBAXIN) 500 MG tablet Take 500 mg by mouth every 6 (six) hours as needed for muscle spasms.    . pregabalin (LYRICA) 100 MG capsule Take 100 mg by mouth 2 (two) times daily.    . rosuvastatin (CRESTOR) 20 MG tablet Take 1 tablet (20 mg total) by mouth daily. 90 tablet 3  . traMADol (ULTRAM) 50 MG tablet Take 100 mg by mouth every 6 (six) hours as needed for moderate pain.    . traZODone (DESYREL) 100 MG tablet Take 100 mg by mouth at bedtime.   0    No results found for this or any previous visit (from the past 48 hour(s)).  No results found.   A comprehensive review of systems was negative.  Blood pressure 125/74, pulse 74, temperature 98.4 F (36.9 C), temperature source Oral, resp. rate 18, height  (1.778 m), weight 113.91 kg (251 lb 2 oz), SpO2 99 %.  General appearance: alert, cooperative and appears stated age Head: Normocephalic, without obvious abnormality, atraumatic Neck: supple, symmetrical, trachea midline Resp: clear to auscultation bilaterally Cardio: regular rate and rhythm GI: non-tender Extremities: Intact sensation and capillary refill all digits.  +epl/fpl/io.  No wounds. Pulses: 2+ and symmetric Skin: Skin color,  texture, turgor normal. No rashes or lesions Neurologic: Grossly normal Incision/Wound:none  Assessment/Plan Right trigger thumb.  Non operative and operative treatment options were discussed with the patient and patient wishes to proceed with operative treatment. Risks, benefits, and alternatives of surgery were discussed and the patient agrees with the plan of care.   Mahkayla Preece R 05/14/2015, 8:33 AM

## 2015-05-14 NOTE — Transfer of Care (Signed)
Immediate Anesthesia Transfer of Care Note  Patient: [name]  Procedure(s) Performed: Procedure(s): RIGHT THUMB TRIGGER RELEASE  (Right)  Patient Location: PACU  Anesthesia Type:Bier block  Level of Consciousness: awake and sedated  Airway & Oxygen Therapy: Patient Spontanous Breathing and Patient connected to face mask oxygen  Post-op Assessment: Report given to RN and Post -op Vital signs reviewed and stable  Post vital signs: Reviewed and stable  Last Vitals:  Filed Vitals:   05/14/15 1012 05/14/15 1013  BP:    Pulse: 75 72  Temp:    Resp:  9    Complications: No apparent anesthesia complications

## 2015-05-14 NOTE — Anesthesia Postprocedure Evaluation (Signed)
Anesthesia Post Note  Patient: [name]  Procedure(s) Performed: Procedure(s) (LRB): RIGHT THUMB TRIGGER RELEASE  (Right)  Patient location during evaluation: PACU Anesthesia Type: MAC and Regional Level of consciousness: awake and alert Pain management: pain level controlled Vital Signs Assessment: post-procedure vital signs reviewed and stable Respiratory status: spontaneous breathing, nonlabored ventilation, respiratory function stable and patient connected to nasal cannula oxygen Cardiovascular status: stable and blood pressure returned to baseline Anesthetic complications: no    Last Vitals:  Filed Vitals:   05/14/15 1100 05/14/15 1115  BP: 102/77 104/74  Pulse: 68 60  Temp:    Resp: 15 12    Last Pain:  Filed Vitals:   05/14/15 1124  PainSc: 2                  Phillips Groutarignan, Zaylin Pistilli

## 2015-05-14 NOTE — Op Note (Signed)
[id number] 

## 2015-05-14 NOTE — Discharge Instructions (Addendum)

## 2015-05-14 NOTE — Brief Op Note (Signed)
05/14/2015  10:15 AM  PATIENT:  [name]  45 y.o. male  PRE-OPERATIVE DIAGNOSIS:  RIGHT TRIGGER THUMB   POST-OPERATIVE DIAGNOSIS:  RIGHT TRIGGER THUMB   PROCEDURE:  Procedure(s): RIGHT THUMB TRIGGER RELEASE  (Right)  SURGEON:  Surgeon(s) and Role:    * Betha LoaKevin Taheem Fricke, MD - Primary  PHYSICIAN ASSISTANT:   ASSISTANTS: none   ANESTHESIA:   Bier block with sedation  EBL:  Total I/O In: 650 [I.V.:650] Out: -   BLOOD ADMINISTERED:none  DRAINS: none   LOCAL MEDICATIONS USED:  MARCAINE     SPECIMEN:  No Specimen  DISPOSITION OF SPECIMEN:  N/A  COUNTS:  YES  TOURNIQUET:   Total Tourniquet Time Documented: Forearm (Right) - 17 minutes Total: Forearm (Right) - 17 minutes   DICTATION: .Other Dictation: Dictation Number [id number]  PLAN OF CARE: Discharge to home after PACU  PATIENT DISPOSITION:  PACU - hemodynamically stable.

## 2015-05-14 NOTE — Anesthesia Postprocedure Evaluation (Signed)
Anesthesia Post Note  Patient: [name]  Procedure(s) Performed: Procedure(s) (LRB): RIGHT THUMB TRIGGER RELEASE  (Right)  Patient location during evaluation: PACU Anesthesia Type: Bier Block Level of consciousness: awake and alert Pain management: pain level controlled Vital Signs Assessment: post-procedure vital signs reviewed and stable Respiratory status: spontaneous breathing, nonlabored ventilation, respiratory function stable and patient connected to nasal cannula oxygen Cardiovascular status: stable and blood pressure returned to baseline Anesthetic complications: no    Last Vitals:  Filed Vitals:   05/14/15 1100 05/14/15 1115  BP: 102/77 104/74  Pulse: 68 60  Temp:    Resp: 15 12    Last Pain:  Filed Vitals:   05/14/15 1124  PainSc: 2                  Shelton SilvasKevin D Hannahmarie Asberry

## 2015-05-14 NOTE — Anesthesia Preprocedure Evaluation (Signed)
Anesthesia Evaluation  Patient identified by MRN, date of birth, ID band Patient awake    Reviewed: Allergy & Precautions, NPO status , Patient's Chart, lab work & pertinent test results  Airway Mallampati: II  TM Distance: >3 FB Neck ROM: Full    Dental no notable dental hx. (+) Caps   Pulmonary sleep apnea , former smoker,    Pulmonary exam normal breath sounds clear to auscultation       Cardiovascular negative cardio ROS Normal cardiovascular exam Rhythm:Regular Rate:Normal     Neuro/Psych Seizures -,  CVA, No Residual Symptoms negative psych ROS   GI/Hepatic negative GI ROS, Neg liver ROS,   Endo/Other  negative endocrine ROSdiabetes, Type 2, Oral Hypoglycemic Agents  Renal/GU negative Renal ROS  negative genitourinary   Musculoskeletal negative musculoskeletal ROS (+)   Abdominal   Peds negative pediatric ROS (+)  Hematology negative hematology ROS (+)   Anesthesia Other Findings   Reproductive/Obstetrics negative OB ROS                             Anesthesia Physical Anesthesia Plan  ASA: III  Anesthesia Plan: Bier Block   Post-op Pain Management:    Induction:   Airway Management Planned: Nasal Cannula  Additional Equipment:   Intra-op Plan:   Post-operative Plan:   Informed Consent: I have reviewed the patients History and Physical, chart, labs and discussed the procedure including the risks, benefits and alternatives for the proposed anesthesia with the patient or authorized representative who has indicated his/her understanding and acceptance.   Dental advisory given  Plan Discussed with: CRNA  Anesthesia Plan Comments:         Anesthesia Quick Evaluation

## 2015-05-14 NOTE — Op Note (Signed)
NAME:  Husband, [name]             ACCOUNT NO.:  [id number]  MEDICAL RECORD NO.:  [id number]  LOCATION:                                 FACILITY:  PHYSICIAN:  Betha LoaKevin Quinlee Sciarra, MD        DATE OF BIRTH:  1970-08-21  DATE OF PROCEDURE:  05/14/2015 DATE OF DISCHARGE:                              OPERATIVE REPORT   PREOPERATIVE DIAGNOSIS:  Right trigger thumb.  POSTOPERATIVE DIAGNOSIS:  Right trigger thumb.  PROCEDURE:  Right trigger thumb release.  SURGEON:  Betha LoaKevin Scarleth Brame, MD.  ASSISTANT:  None.  ANESTHESIA:  Bier block with sedation.  IV FLUIDS:  Per anesthesia flow sheet.  ESTIMATED BLOOD LOSS:  Minimal.  COMPLICATIONS:  None.  SPECIMENS:  None.  TIME OF TOURNIQUET:  17 minutes.  DISPOSITION:  Stable to PACU.  INDICATIONS:  [name] is a 45 year old male, who has had trigger of the right thumb.  He has had this injected without relief.  He wishes to have the release for management of symptoms.  Risks, benefits, and alternatives of surgery were discussed including risk of blood loss; infection; damage to nerves, vessels, tendons, ligaments, bone; failure of surgery; need for additional surgery; complications with wound healing; continued pain; recurrence of triggering.  He voiced understanding of these risks and elected to proceed.  OPERATIVE COURSE:  After being identified preoperatively by myself, the patient and I agreed upon the procedure and site of procedure.  Surgical site was marked.  The risks, benefits, and alternatives of surgery were reviewed and he wished to proceed.  Surgical consent had been signed. He was given IV Ancef as preoperative antibiotic prophylaxis.  He was transferred to the operating room and placed on the operating room table in supine position with the right upper extremity on arm board.  Bier block anesthesia was induced by Anesthesiology.  Right upper extremity was prepped and draped in normal sterile orthopedic fashion.   Surgical pause was performed between surgeons, anesthesia, operating staff, and all were agreement as to the patient, procedure, and site of procedure. Tourniquet at the proximal aspect of the forearm had been inflated for the Bier block.  Incision was made over the proximal flexion crease of the thumb and carried down into subcutaneous tissues by spreading technique.  Bipolar electrocautery was used to obtain hemostasis.  The radial and ulnar digital nerves were identified and protected throughout the case.  The A1 pulley was identified.  It was sharply incised. Care was taken to ensure complete decompression which was the case.  The tendon was brought through the wound and no triggering was noted.  Range of motion of the thumb was confirmed with no triggering.  The wound was copiously irrigated with sterile saline and closed with 4-0 nylon in a horizontal mattress fashion.  It was injected with 5 mL of 0.25% plain Marcaine to aid in postoperative analgesia.  It was dressed with sterile Xeroform, 4x4s, and wrapped with Kling and Coban dressing lightly. Tourniquet was deflated at 17 minutes.  The fingertips were pink with brisk capillary refill after deflation of tourniquet.  Operative drapes were broken down.  The patient was awoken from anesthesia safely.  He was transferred back  to stretcher and taken to PACU in stable condition. I will see him back in the office in 1 week for postoperative followup. I will give him Norco 5/325, 1-2 p.o. q.6 hours p.r.n. pain, dispensed #30.     Betha Loa, MD     KK/MEDQ  D:  05/14/2015  T:  05/14/2015  Job:  [id number]

## 2015-05-18 ENCOUNTER — Encounter (HOSPITAL_BASED_OUTPATIENT_CLINIC_OR_DEPARTMENT_OTHER): Payer: Self-pay | Admitting: Orthopedic Surgery

## 2015-05-29 DIAGNOSIS — M65311 Trigger thumb, right thumb: Secondary | ICD-10-CM | POA: Insufficient documentation

## 2015-06-15 ENCOUNTER — Ambulatory Visit: Payer: BLUE CROSS/BLUE SHIELD | Admitting: Pulmonary Disease

## 2015-07-15 ENCOUNTER — Ambulatory Visit (INDEPENDENT_AMBULATORY_CARE_PROVIDER_SITE_OTHER): Payer: 59 | Admitting: Pulmonary Disease

## 2015-07-15 ENCOUNTER — Encounter: Payer: Self-pay | Admitting: Pulmonary Disease

## 2015-07-15 VITALS — BP 120/76 | HR 78 | Ht 70.0 in | Wt 255.0 lb

## 2015-07-15 DIAGNOSIS — G4733 Obstructive sleep apnea (adult) (pediatric): Secondary | ICD-10-CM

## 2015-07-15 DIAGNOSIS — F5112 Insufficient sleep syndrome: Secondary | ICD-10-CM

## 2015-07-15 DIAGNOSIS — G4700 Insomnia, unspecified: Secondary | ICD-10-CM | POA: Insufficient documentation

## 2015-07-15 NOTE — Progress Notes (Signed)
   Subjective:    Patient ID: [name], male    DOB: [birth year], 45 y.o.   MRN: [id number]  HPI  Chief Complaint  Patient presents with  . Sleep Apnea    Former [facility] pt. Currently using CPAP machine every night for about 4-5 hours. Denies issues with machine, mask or pressure. DME is [employer].   He has been maintained on CPAP for more than 10 years. He got a new machine last year, he uses a full face mask, reports good compliance, no problems with mask or pressure or dryness of mouth Wife has not noted any snoring Bedtime is around 12 midnight-finds it difficult to unwind from work, he always wakes up at 5 AM has always been an early riser Feels like he needs to nap every chance he gets  No problems with DME and supplies   Significant tests/ events  NPSG 2006:  AHI 78/hr Auto optimized to 13-14cm.  autoset 01/2011:  13cm  Review of Systems Patient denies significant dyspnea,cough, hemoptysis,  chest pain, palpitations, pedal edema, orthopnea, paroxysmal nocturnal dyspnea, lightheadedness, nausea, vomiting, abdominal or  leg pains      Objective:   Physical Exam  Gen. Pleasant, obese, in no distress ENT - no lesions, no post nasal drip Neck: No JVD, no thyromegaly, no carotid bruits Lungs: no use of accessory muscles, no dullness to percussion, decreased without rales or rhonchi  Cardiovascular: Rhythm regular, heart sounds  normal, no murmurs or gallops, no peripheral edema Musculoskeletal: No deformities, no cyanosis or clubbing , no tremors       Assessment & Plan:

## 2015-07-15 NOTE — Assessment & Plan Note (Signed)
I have asked him to try and increase his sleep time to 6 hours every night

## 2015-07-15 NOTE — Assessment & Plan Note (Signed)
Download will be checked CPAP supplies will be renewed for a year Try to increase sleep duration to at least 6 hours every night-he may have to go to bed a little sooner  Weight loss encouraged, compliance with goal of at least 4-6 hrs every night is the expectation. Advised against medications with sedative side effects Cautioned against driving when sleepy - understanding that sleepiness will vary on a day to day basis

## 2015-07-15 NOTE — Patient Instructions (Signed)
   Download will be checked CPAP supplies will be renewed for a year

## 2015-09-27 ENCOUNTER — Other Ambulatory Visit: Payer: Self-pay | Admitting: Internal Medicine

## 2015-10-28 ENCOUNTER — Emergency Department (HOSPITAL_COMMUNITY): Payer: 59

## 2015-10-28 ENCOUNTER — Encounter (HOSPITAL_COMMUNITY): Payer: Self-pay | Admitting: Emergency Medicine

## 2015-10-28 ENCOUNTER — Emergency Department (HOSPITAL_COMMUNITY)
Admission: EM | Admit: 2015-10-28 | Discharge: 2015-10-28 | Disposition: A | Discharge status: Home / Self Care | Payer: 59 | Attending: Emergency Medicine | Admitting: Emergency Medicine

## 2015-10-28 DIAGNOSIS — M545 Low back pain: Secondary | ICD-10-CM | POA: Diagnosis not present

## 2015-10-28 DIAGNOSIS — Z8673 Personal history of transient ischemic attack (TIA), and cerebral infarction without residual deficits: Secondary | ICD-10-CM | POA: Insufficient documentation

## 2015-10-28 DIAGNOSIS — R51 Headache: Secondary | ICD-10-CM | POA: Insufficient documentation

## 2015-10-28 DIAGNOSIS — Z87891 Personal history of nicotine dependence: Secondary | ICD-10-CM | POA: Diagnosis not present

## 2015-10-28 DIAGNOSIS — E119 Type 2 diabetes mellitus without complications: Secondary | ICD-10-CM | POA: Insufficient documentation

## 2015-10-28 DIAGNOSIS — Z7984 Long term (current) use of oral hypoglycemic drugs: Secondary | ICD-10-CM | POA: Diagnosis not present

## 2015-10-28 DIAGNOSIS — R079 Chest pain, unspecified: Secondary | ICD-10-CM | POA: Diagnosis present

## 2015-10-28 DIAGNOSIS — M549 Dorsalgia, unspecified: Secondary | ICD-10-CM

## 2015-10-28 DIAGNOSIS — R0789 Other chest pain: Secondary | ICD-10-CM | POA: Insufficient documentation

## 2015-10-28 DIAGNOSIS — R208 Other disturbances of skin sensation: Secondary | ICD-10-CM | POA: Diagnosis not present

## 2015-10-28 DIAGNOSIS — Z79899 Other long term (current) drug therapy: Secondary | ICD-10-CM | POA: Diagnosis not present

## 2015-10-28 DIAGNOSIS — R29898 Other symptoms and signs involving the musculoskeletal system: Secondary | ICD-10-CM

## 2015-10-28 DIAGNOSIS — M542 Cervicalgia: Secondary | ICD-10-CM

## 2015-10-28 LAB — COMPREHENSIVE METABOLIC PANEL
ALT: 13 U/L — ABNORMAL LOW (ref 17–63)
ANION GAP: 10 (ref 5–15)
AST: 21 U/L (ref 15–41)
Albumin: 3.8 g/dL (ref 3.5–5.0)
Alkaline Phosphatase: 54 U/L (ref 38–126)
BUN: 9 mg/dL (ref 6–20)
CHLORIDE: 103 mmol/L (ref 101–111)
CO2: 25 mmol/L (ref 22–32)
Calcium: 9.4 mg/dL (ref 8.9–10.3)
Creatinine, Ser: 0.99 mg/dL (ref 0.61–1.24)
Glucose, Bld: 107 mg/dL — ABNORMAL HIGH (ref 65–99)
POTASSIUM: 4.2 mmol/L (ref 3.5–5.1)
Sodium: 138 mmol/L (ref 135–145)
Total Bilirubin: 0.5 mg/dL (ref 0.3–1.2)
Total Protein: 7.1 g/dL (ref 6.5–8.1)

## 2015-10-28 LAB — I-STAT TROPONIN, ED: TROPONIN I, POC: 0 ng/mL (ref 0.00–0.08)

## 2015-10-28 LAB — I-STAT CHEM 8, ED
BUN: 11 mg/dL (ref 6–20)
CREATININE: 0.9 mg/dL (ref 0.61–1.24)
Calcium, Ion: 1.11 mmol/L — ABNORMAL LOW (ref 1.15–1.40)
Chloride: 104 mmol/L (ref 101–111)
Glucose, Bld: 102 mg/dL — ABNORMAL HIGH (ref 65–99)
HEMATOCRIT: 48 % (ref 39.0–52.0)
HEMOGLOBIN: 16.3 g/dL (ref 13.0–17.0)
Potassium: 4 mmol/L (ref 3.5–5.1)
SODIUM: 140 mmol/L (ref 135–145)
TCO2: 26 mmol/L (ref 0–100)

## 2015-10-28 LAB — DIFFERENTIAL
BASOS ABS: 0 10*3/uL (ref 0.0–0.1)
BASOS PCT: 0 %
EOS ABS: 0.2 10*3/uL (ref 0.0–0.7)
Eosinophils Relative: 3 %
Lymphocytes Relative: 33 %
Lymphs Abs: 2.3 10*3/uL (ref 0.7–4.0)
MONOS PCT: 7 %
Monocytes Absolute: 0.5 10*3/uL (ref 0.1–1.0)
Neutro Abs: 4 10*3/uL (ref 1.7–7.7)
Neutrophils Relative %: 57 %

## 2015-10-28 LAB — URINALYSIS, ROUTINE W REFLEX MICROSCOPIC
Bilirubin Urine: NEGATIVE
Glucose, UA: NEGATIVE mg/dL
Hgb urine dipstick: NEGATIVE
Ketones, ur: NEGATIVE mg/dL
LEUKOCYTES UA: NEGATIVE
Nitrite: NEGATIVE
PH: 6.5 (ref 5.0–8.0)
Protein, ur: NEGATIVE mg/dL
SPECIFIC GRAVITY, URINE: 1.014 (ref 1.005–1.030)

## 2015-10-28 LAB — TROPONIN I

## 2015-10-28 LAB — CBC
HEMATOCRIT: 46 % (ref 39.0–52.0)
Hemoglobin: 15.2 g/dL (ref 13.0–17.0)
MCH: 26 pg (ref 26.0–34.0)
MCHC: 33 g/dL (ref 30.0–36.0)
MCV: 78.8 fL (ref 78.0–100.0)
Platelets: 219 10*3/uL (ref 150–400)
RBC: 5.84 MIL/uL — ABNORMAL HIGH (ref 4.22–5.81)
RDW: 15.7 % — AB (ref 11.5–15.5)
WBC: 7.1 10*3/uL (ref 4.0–10.5)

## 2015-10-28 LAB — PROTIME-INR
INR: 0.94
Prothrombin Time: 12.6 seconds (ref 11.4–15.2)

## 2015-10-28 LAB — APTT: APTT: 29 s (ref 24–36)

## 2015-10-28 LAB — RAPID URINE DRUG SCREEN, HOSP PERFORMED
Amphetamines: NOT DETECTED
BENZODIAZEPINES: NOT DETECTED
Barbiturates: NOT DETECTED
COCAINE: NOT DETECTED
OPIATES: NOT DETECTED
Tetrahydrocannabinol: NOT DETECTED

## 2015-10-28 MED ORDER — METHOCARBAMOL 500 MG PO TABS
500.0000 mg | ORAL_TABLET | Freq: Once | ORAL | Status: AC
  Administered 2015-10-28: 500 mg via ORAL
  Filled 2015-10-28: qty 1

## 2015-10-28 MED ORDER — FENTANYL CITRATE (PF) 100 MCG/2ML IJ SOLN
50.0000 ug | Freq: Once | INTRAMUSCULAR | Status: AC
  Administered 2015-10-28: 50 ug via INTRAVENOUS
  Filled 2015-10-28: qty 2

## 2015-10-28 MED ORDER — LORAZEPAM 2 MG/ML IJ SOLN
INTRAMUSCULAR | Status: AC
  Filled 2015-10-28: qty 1

## 2015-10-28 MED ORDER — CYCLOBENZAPRINE HCL 10 MG PO TABS: 10.0000 mg | ORAL_TABLET | Freq: Two times a day (BID) | ORAL | 0 refills | Status: DC | PRN

## 2015-10-28 MED ORDER — LORAZEPAM 2 MG/ML IJ SOLN: 1.0000 mg | Freq: Once | INTRAMUSCULAR | Status: DC

## 2015-10-28 MED ORDER — HYDROMORPHONE HCL 1 MG/ML IJ SOLN
1.0000 mg | Freq: Once | INTRAMUSCULAR | Status: AC
  Administered 2015-10-28: 1 mg via INTRAVENOUS
  Filled 2015-10-28: qty 1

## 2015-10-28 MED ORDER — IBUPROFEN 400 MG PO TABS: 400.0000 mg | ORAL_TABLET | Freq: Four times a day (QID) | ORAL | 0 refills | Status: DC | PRN

## 2015-10-28 MED ORDER — ASPIRIN 325 MG PO TABS
325.0000 mg | ORAL_TABLET | Freq: Once | ORAL | Status: AC
  Administered 2015-10-28: 325 mg via ORAL
  Filled 2015-10-28: qty 1

## 2015-10-28 NOTE — ED Notes (Signed)
Pt taken to MRI  

## 2015-10-28 NOTE — ED Provider Notes (Signed)
[facility]-EMER[facility]ENCY DEPT Provider Note   CSN: [id number] Arrival date & time: 10/28/15  0732     History   Chief Complaint Chief Complaint  Patient presents with  . Chest Pain  . Extremity Weakness    HPI [name] is a 45 y.o. male.  45 year old male with a past medical history of stroke presents to the emergency department today with onset of weakness in his left arm and left leg last night. This persisted throughout the morning and had increased weakness this morning and acute onset of chest pain that feels sharp. Associated with being sweaty and also had a headache recently. No other associated symptoms. Has little bit of numbness and tingling in his left arm but no other related symptoms. No modifying factors. No shortness of breath, nausea, vomiting, back pain. No lower lower extremity swelling.      Past Medical History:  Diagnosis Date  . Adrenal abnormality (HCC)   . Allergy   . Atypical chest pain   . BPH (benign prostatic hypertrophy)   . DM (diabetes mellitus) (HCC)   . GERD (gastroesophageal reflux disease)   . Gout   . H/O: CVA (cardiovascular accident)   . Hyperlipidemia   . Knee pain, bilateral   . LBP (low back pain)   . OSA (obstructive sleep apnea)   . Plantar fasciitis    followed by Dr Harriet Pho  . Seizure disorder [facility])     Patient Active Problem List   Diagnosis Date Noted  . Behaviorally induced insufficient sleep syndrome 07/15/2015  . Atypical chest pain 03/05/2015  . Facial pain 08/28/2013  . Obesity 03/15/2013  . Numbness and tingling in left hand 11/15/2012  . BPH (benign prostatic hypertrophy) 11/15/2012  . Right elbow pain 02/07/2012  . Lumbar facet arthropathy 08/02/2011  . Lumbar spondylosis 08/02/2011  . Quadriceps tendonitis 08/02/2011  . Eustachian tube dysfunction 07/22/2011  . Type II or unspecified type diabetes mellitus without mention of complication, not stated as uncontrolled 12/22/2010  . Preventative health care  12/22/2010  . Foot pain 12/22/2010  . OBESITY, UNSPECIFIED 03/16/2009  . KNEE PAIN, BILATERAL 12/08/2008  . THUMB PAIN, LEFT 06/12/2007  . SHOULDER PAIN, RIGHT 02/15/2007  . HYPERLIPIDEMIA 08/23/2006  . GOUT 08/23/2006  . Obstructive sleep apnea 08/23/2006  . ALLERGIC RHINITIS 08/23/2006  . ARTHRALGIA 08/23/2006  . LOW BACK PAIN 08/23/2006  . PLANTAR FASCIITIS 08/23/2006  . SEIZURE DISORDER 08/23/2006  . CEREBROVASCULAR ACCIDENT, HX OF 08/23/2006    Past Surgical History:  Procedure Laterality Date  . left heel surgery     x2  . right shoulder surgery     x2  . TRIGGER FINGER RELEASE Right 05/14/2015   Procedure: RIGHT THUMB TRIGGER RELEASE ;  Surgeon: Betha Loa, MD;  Location: [facility];  Service: Orthopedics;  Laterality: Right;       Home Medications    Prior to Admission medications   Medication Sig Start Date End Date Taking? Authorizing Provider  buPROPion (WELLBUTRIN SR) 150 MG 12 hr tablet Take 150 mg by mouth 2 (two) times daily as needed (mood).   Yes Historical Provider, MD  cetirizine (ZYRTEC) 10 MG tablet Take 10 mg by mouth every other day.   Yes Historical Provider, MD  escitalopram (LEXAPRO) 20 MG tablet Take 20 mg by mouth daily. Once daily 01/07/14  Yes Historical Provider, MD  metFORMIN (GLUCOPHAGE) 500 MG tablet TAKE 2 TABLETS BY MOUTH TWICE A DAY WITH A MEAL 05/14/15  Yes Doe-Hyun R  Artist Pais, DO  methocarbamol (ROBAXIN) 500 MG tablet Take 500 mg by mouth every 6 (six) hours as needed for muscle spasms.   Yes Historical Provider, MD  naproxen sodium (ANAPROX) 220 MG tablet Take 440 mg by mouth as needed (back pain).   Yes Historical Provider, MD  rosuvastatin (CRESTOR) 20 MG tablet Take 1 tablet (20 mg total) by mouth daily. 03/05/15  Yes Runell Gess, MD  traZODone (DESYREL) 100 MG tablet Take 100 mg by mouth at bedtime as needed for sleep.  05/09/14  Yes Historical Provider, MD  cyclobenzaprine (FLEXERIL) 10 MG tablet Take 1 tablet (10 mg  total) by mouth 2 (two) times daily as needed for muscle spasms. 10/28/15   Marily Memos, MD  ibuprofen (ADVIL,MOTRIN) 400 MG tablet Take 1 tablet (400 mg total) by mouth every 6 (six) hours as needed. 10/28/15   Marily Memos, MD    Family History Family History  Problem Relation Age of Onset  . Diabetes Father   . Hypertension Father   . Heart attack Maternal Grandfather   . Cancer Maternal Uncle   . Allergies Daughter     Social History Social History  Substance Use Topics  . Smoking status: Former Smoker    Years: 4.00    Types: Cigarettes    Quit date: 12/22/1990  . Smokeless tobacco: Never Used     Comment: 1-2 packs a week  . Alcohol use No     Allergies   Atorvastatin and Cymbalta [duloxetine hcl]   Review of Systems Review of Systems  Respiratory: Negative for cough and shortness of breath.   Cardiovascular: Positive for chest pain.  Neurological: Positive for weakness (LUE/LLE) and numbness. Negative for dizziness, facial asymmetry and light-headedness.  All other systems reviewed and are negative.    Physical Exam Updated Vital Signs BP 125/89   Pulse 94   Temp 98.1 F (36.7 C)   Resp 15   Ht 5\' 10"  (1.778 m)   Wt 257 lb (116.6 kg)   SpO2 97%   BMI 36.88 kg/m   Physical Exam  Constitutional: He is oriented to person, place, and time. He appears well-developed and well-nourished.  HENT:  Head: Normocephalic and atraumatic.  Eyes: Conjunctivae are normal.  Neck: Neck supple.  Cardiovascular: Normal rate and regular rhythm.   No murmur heard. Pulmonary/Chest: Effort normal and breath sounds normal. No respiratory distress.  Abdominal: Soft. There is no tenderness.  Musculoskeletal: He exhibits no edema, tenderness or deformity.  Neurological: He is alert and oriented to person, place, and time.  No altered mental status, able to give full seemingly accurate history.  Face is symmetric, EOM's intact, pupils equal and reactive, vision intact,  tongue and uvula midline without deviation LUE/LLE 4/5 motor, Right side is 5/5, intact pain perception in distal extremities, 2+ reflexes in biceps, patella and achilles tendons. Finger to nose normal, heel to shin normal.   Skin: Skin is warm and dry.  Psychiatric: He has a normal mood and affect.  Nursing note and vitals reviewed.    ED Treatments / Results  Labs (all labs ordered are listed, but only abnormal results are displayed) Labs Reviewed  CBC - Abnormal; Notable for the following:       Result Value   RBC 5.84 (*)    RDW 15.7 (*)    All other components within normal limits  COMPREHENSIVE METABOLIC PANEL - Abnormal; Notable for the following:    Glucose, Bld 107 (*)    ALT  13 (*)    All other components within normal limits  I-STAT CHEM 8, ED - Abnormal; Notable for the following:    Glucose, Bld 102 (*)    Calcium, Ion 1.11 (*)    All other components within normal limits  PROTIME-INR  APTT  DIFFERENTIAL  URINE RAPID DRUG SCREEN, HOSP PERFORMED  URINALYSIS, ROUTINE W REFLEX MICROSCOPIC (NOT AT [facility])  TROPONIN I  I-STAT TROPOININ, ED    EKG  EKG Interpretation  Date/Time:  Wednesday October 28 2015 07:39:03 EDT Ventricular Rate:  87 PR Interval:    QRS Duration: 94 QT Interval:  362 QTC Calculation: 436 R Axis:   56 Text Interpretation:  Sinus rhythm Baseline wander in lead(s) I II aVR V1 V3 No significant change since last tracing Confirmed by [facility] MD, Barbara Cower [id number]) on 10/28/2015 7:43:30 AM Also confirmed by [facility] MD, Barbara Cower [phone]), editor BREWER, CCT, GLENN [phone])  on 10/28/2015 1:32:59 PM       Radiology Ct Head Wo Contrast  Result Date: 10/28/2015 CLINICAL DATA:  Evaluate for headache, delete EXAM: CT HEAD WITHOUT CONTRAST TECHNIQUE: Contiguous axial images were obtained from the base of the skull through the vertex without intravenous contrast. COMPARISON:  None. FINDINGS: Brain: No intracranial hemorrhage, mass effect or midline shift. No  acute cortical infarction. No mass lesion is noted on this unenhanced scan. The gray and white-matter differentiation is preserved. No hydrocephalus. No intra or extra-axial fluid collection. Vascular: No hyperdense vessel or unexpected calcification. Skull: Normal. Negative for fracture or focal lesion. Sinuses/Orbits: No acute finding. Other: None. IMPRESSION: No acute intracranial abnormality. No definite acute cortical infarction. Electronically Signed   By: Natasha Mead M.D.   On: 10/28/2015 09:01   Mr Brain Wo Contrast  Result Date: 10/28/2015 CLINICAL DATA:  Left upper extremity weakness beginning last evening. Personal history of CVA and seizure. EXAM: MRI HEAD WITHOUT CONTRAST TECHNIQUE: Multiplanar, multiecho pulse sequences of the brain and surrounding structures were obtained without intravenous contrast. COMPARISON:  CT head without contrast from the same day. FINDINGS: Brain: Diffusion-weighted images demonstrate no evidence for acute or subacute infarction. No acute hemorrhage or mass lesion is present. The ventricles are normal size. No significant extra-axial fluid collection is present. There is no significant white matter disease. The brainstem and cerebellum are normal. Vascular: Flow is present in the major intracranial arteries. Skull and upper cervical spine: The skullbase is within normal limits. The internal auditory canals are normal. Midline sagittal structures are unremarkable. Sinuses/Orbits: The paranasal sinuses are clear. There is some fluid in the posterior left mastoid air cells. No obstructing nasopharyngeal lesion is present. Other: IMPRESSION: Negative MRI of the brain. Electronically Signed   By: Marin Roberts M.D.   On: 10/28/2015 14:04    Procedures Procedures (including critical care time)  Medications Ordered in ED Medications  aspirin tablet 325 mg (325 mg Oral Given 10/28/15 0836)  methocarbamol (ROBAXIN) tablet 500 mg (500 mg Oral Given 10/28/15 0836)    fentaNYL (SUBLIMAZE) injection 50 mcg (50 mcg Intravenous Given 10/28/15 0836)  HYDROmorphone (DILAUDID) injection 1 mg (1 mg Intravenous Given 10/28/15 1344)     Initial Impression / Assessment and Plan / ED Course  I have reviewed the triage vital signs and the nursing notes.  Pertinent labs & imaging results that were available during my care of the patient were reviewed by me and considered in my medical decision making (see chart for details).  Clinical Course   Possibly CVA, however symptoms started last  night so not in the window for TPA or code stroke. We'll do the workup anyway. Could also be related to ischemia so we'll evaluate for cardiac ischemia however seems slightly atypical for that. Could also be musculoskeletal so we will treat him with some pain medication for that as well. Aspirin to be administered.  Delta trop/serial ECGs unremarkable. MRI ok.  Doubt cardiac/pulmonary/aortic causes for CP, more likely MSK.  Neuro consulted, MRI completed. Unlikely to be CVA. Possible pain related.   New Prescriptions: Discharge Medication List as of 10/28/2015  3:47 PM    START taking these medications   Details  ibuprofen (ADVIL,MOTRIN) 400 MG tablet Take 1 tablet (400 mg total) by mouth every 6 (six) hours as needed., Starting Wed 10/28/2015, Print        I have personally and contemperaneously reviewed labs and imaging and used in my decision making as above.   A medical screening exam was performed and I feel the patient has had an appropriate workup for their chief complaint at this time and likelihood of emergent condition existing is low and thus workup can continue on an outpatient basis.. Their vital signs are stable. They have been counseled on decision, discharge, follow up and which symptoms necessitate immediate return to the emergency department.  They verbally stated understanding and agreement with plan and discharged in stable condition.    Final Clinical  Impressions(s) / ED Diagnoses   Final diagnoses:  Chest pain, unspecified chest pain type  Left-sided back pain, unspecified location      Marily MemosJason Cheveyo [place], MD 10/29/15 [phone]

## 2015-10-28 NOTE — ED Notes (Signed)
Pt returned from MRI °

## 2015-10-28 NOTE — Consult Note (Signed)
Neurology Consult Note  Reason for Consultation: L-sided numbness and weakness  Requesting provider: Marily MemosJason Mesner, MD  CC: Pain in left side of her neck radiating into the left arm and leg accompanied by some numbness and weakness on the left side  HPI: This is a 45 year old right-handed man who presents to the emergency Department with complaint of chest pain and left-sided weakness. History is obtained directly from the patient who is an excellent historian. His wife is present at the bedside and she offers additional information.  The patient tells me that he developed some pain on his left side earlier today that is accompanied by by some numbness and weakness on the left. On further questioning, he states that the pain is located on the left side of his neck and that it radiates down into his left arm and left leg. This is been persistent today. He also reports that he has some numbness but when specifically asked she states that this consists of tingling in the fingers of both hands. He reports weakness in his left arm. He denies any recent injuries to the head or neck.   He states that he has never had any similar neck pain, though he does have chronic low back pain with radiation into both legs. He denies any history of weakness in the left arm. However, he tells me that he had previous stroke and seizure in 2001 which was associated with some numbness on the left side of his body. With this episode, he states that it started with recurrent sharp pains in the left side of his head. He states that his family found him with his tongue hanging out of his mouth, poorly responsive. He was taken to the hospital [facility] where he stayed for several days. They were told that he had a stroke and he reports that he was placed on Depakote for a period of time. He denies any residual numbness from this episode and has not had any further seizure activity.  He reports that he has been  followed by an orthopedist as well as a pain management clinic for his back pain. He reports that they have used Lyrica and Nucynta in the past. Most recently they started him on gabapentin. He is unsure what dose he was taking but they report that he was taking 5 or 6 tablets a day. He ran out of his gabapentin recently and is presently waiting for the office to call him back so that he can get a follow-up visit to resume medications.  PMH:  Past Medical History:  Diagnosis Date  . Adrenal abnormality (HCC)   . Allergy   . Atypical chest pain   . BPH (benign prostatic hypertrophy)   . DM (diabetes mellitus) (HCC)   . GERD (gastroesophageal reflux disease)   . Gout   . H/O: CVA (cardiovascular accident)   . Hyperlipidemia   . Knee pain, bilateral   . LBP (low back pain)   . OSA (obstructive sleep apnea)   . Plantar fasciitis    followed by Dr Harriet PhoAjlouny  . Seizure disorder (HCC)     PSH:  Past Surgical History:  Procedure Laterality Date  . left heel surgery     x2  . right shoulder surgery     x2  . TRIGGER FINGER RELEASE Right 05/14/2015   Procedure: RIGHT THUMB TRIGGER RELEASE ;  Surgeon: Betha LoaKevin Kuzma, MD;  Location: [facility];  Service: Orthopedics;  Laterality: Right;  Family history: Family History  Problem Relation Age of Onset  . Diabetes Father   . Hypertension Father   . Heart attack Maternal Grandfather   . Cancer Maternal Uncle   . Allergies Daughter     Social history:  Social History   Social History  . Marital status: Married    Spouse name: N/A  . Number of children: 2  . Years of education: N/A   Occupational History  . Production designer, theatre/television/film and clergy    Social History Main Topics  . Smoking status: Former Smoker    Years: 4.00    Types: Cigarettes    Quit date: 12/22/1990  . Smokeless tobacco: Never Used     Comment: 1-2 packs a week  . Alcohol use No  . Drug use: No  . Sexual activity: Not on file   Other Topics Concern  .  Not on file   Social History Narrative   Works for [employer]    Current outpatient meds: Current Meds  Medication Sig  . buPROPion (WELLBUTRIN SR) 150 MG 12 hr tablet Take 150 mg by mouth 2 (two) times daily as needed (mood).  . cetirizine (ZYRTEC) 10 MG tablet Take 10 mg by mouth every other day.  . escitalopram (LEXAPRO) 20 MG tablet Take 20 mg by mouth daily. Once daily  . metFORMIN (GLUCOPHAGE) 500 MG tablet TAKE 2 TABLETS BY MOUTH TWICE A DAY WITH A MEAL  . methocarbamol (ROBAXIN) 500 MG tablet Take 500 mg by mouth every 6 (six) hours as needed for muscle spasms.  . naproxen sodium (ANAPROX) 220 MG tablet Take 440 mg by mouth as needed (back pain).  . rosuvastatin (CRESTOR) 20 MG tablet Take 1 tablet (20 mg total) by mouth daily.  . traZODone (DESYREL) 100 MG tablet Take 100 mg by mouth at bedtime as needed for sleep.   . [DISCONTINUED] cyclobenzaprine (FLEXERIL) 10 MG tablet Take 10 mg by mouth at bedtime as needed for muscle spasms.    Current inpatient meds:  Current Facility-Administered Medications  Medication Dose Route Frequency Provider Last Rate Last Dose  . LORazepam (ATIVAN) injection 1 mg  1 mg Intravenous Once Marily Memos, MD       Current Outpatient Prescriptions  Medication Sig Dispense Refill  . buPROPion (WELLBUTRIN SR) 150 MG 12 hr tablet Take 150 mg by mouth 2 (two) times daily as needed (mood).    . cetirizine (ZYRTEC) 10 MG tablet Take 10 mg by mouth every other day.    . escitalopram (LEXAPRO) 20 MG tablet Take 20 mg by mouth daily. Once daily  0  . metFORMIN (GLUCOPHAGE) 500 MG tablet TAKE 2 TABLETS BY MOUTH TWICE A DAY WITH A MEAL 180 tablet 1  . methocarbamol (ROBAXIN) 500 MG tablet Take 500 mg by mouth every 6 (six) hours as needed for muscle spasms.    . naproxen sodium (ANAPROX) 220 MG tablet Take 440 mg by mouth as needed (back pain).    . rosuvastatin (CRESTOR) 20 MG tablet Take 1 tablet (20 mg total) by mouth daily. 90 tablet 3  . traZODone  (DESYREL) 100 MG tablet Take 100 mg by mouth at bedtime as needed for sleep.   0  . cyclobenzaprine (FLEXERIL) 10 MG tablet Take 1 tablet (10 mg total) by mouth 2 (two) times daily as needed for muscle spasms. 20 tablet 0  . ibuprofen (ADVIL,MOTRIN) 400 MG tablet Take 1 tablet (400 mg total) by mouth every 6 (six) hours as needed. 30 tablet  0    Allergies: Allergies  Allergen Reactions  . Atorvastatin Swelling    Tingling, numbness.    Marland Kitchen Cymbalta [Duloxetine Hcl]     Stomach upset, difficulty urinating, decreased libido    ROS: As per HPI. A full 14-point review of systems was performed and is otherwise notable for chronic pain in both knees. Remainder of review of systems is unrevealing. PE:  BP 122/88   Pulse 70   Temp 98.1 F (36.7 C)   Resp 16   Ht 5\' 10"  (1.778 m)   Wt 116.6 kg (257 lb)   SpO2 95%   BMI 36.88 kg/m   General: WDWN, no acute distress. AAO x4. Speech clear, no dysarthria. No aphasia. Follows commands briskly. Affect is bright with congruent mood. Comportment is normal.  HEENT: Normocephalic. Neck supple without LAD. MMM, OP clear. Dentition good. Sclerae anicteric. No conjunctival injection.  CV: Regular, no murmur. Carotid pulses full and symmetric, no bruits. Distal pulses 2+ and symmetric.  Lungs: CTAB.  Abdomen: Soft, non-distended, non-tender. Bowel sounds present x4.  Extremities: No C/C/E. Spine: He complains of increased left-sided neck pain with right head turn. Neuro:  CN: Pupils are equal and round. They are symmetrically reactive from 3-->2 mm. EOMI without nystagmus. No reported diplopia. Facial sensation is notable for decreased pinprick and vibration on the left side of the forehead with midline splitting. Face is symmetric at rest with normal strength and mobility. Hearing is intact to conversational voice. Palate elevates symmetrically and uvula is midline. Voice is normal in tone, pitch and quality. Bilateral SCM and trapezii are 5/5. Tongue is  midline with normal bulk and mobility.  Motor: Normal bulk, tone, and strength. He has some give way in the left upper extremity because of neck pain and in both lower extremities because of back pain. With encouragement, however, the initial effort results and 5/5 strength in all muscle groups tested. No tremor or other abnormal movements. No drift.  Sensation: Decreased to light touch in the left arm and leg. Pinprick is diminished in the left upper extremity in an inconsistent and nonphysiologic pattern. He also reports very slight decrease in pinprick in the left lower extremity compared to the right. Vibratory sensation is intact. DTRs: 2+, symmetric. Toes downgoing bilaterally. No pathologic reflexes.  Coordination: Finger-to-nose is without dysmetria. Finger taps are normal in amplitude and speed, no decrement.     Labs:  Lab Results  Component Value Date   WBC 7.1 10/28/2015   HGB 16.3 10/28/2015   HCT 48.0 10/28/2015   PLT 219 10/28/2015   GLUCOSE 102 (H) 10/28/2015   CHOL 249 (H) 02/07/2014   TRIG 139.0 02/07/2014   HDL 38.90 (L) 02/07/2014   LDLDIRECT 148.1 11/04/2010   LDLCALC 182 (H) 02/07/2014   ALT 13 (L) 10/28/2015   AST 21 10/28/2015   NA 140 10/28/2015   K 4.0 10/28/2015   CL 104 10/28/2015   CREATININE 0.90 10/28/2015   BUN 11 10/28/2015   CO2 25 10/28/2015   TSH 2.06 02/07/2014   PSA 0.50 02/07/2014   INR 0.94 10/28/2015   HGBA1C 6.6 (H) 02/07/2014   MICROALBUR 0.9 02/07/2014    Imaging:  I have personally and independently reviewed the MRI scan of the brain without contrast from today. This is a normal brain.   Assessment and Plan:  1. Neck pain: This appears to be acute. He does not have any radicular signs or symptoms. Neurologic examination is notable only for subjective sensory changes  that do not conform to any anatomic distribution. This would make structural pathology of the cervical spine much less likely and would essentially exclude myelopathy.  At this point, I recommend conservative management of his neck pain with nonsteroidal anti-inflammatories and heat/ice. If pain persists, he was encouraged to continue efforts to resume care with his pain management specialist.  2. Left upper extremity numbness: This does not fit any central or peripheral pattern. Recommend conservative management as above, anticipate improvement with resolution of pain. No objective neurologic abnormalities of note.  3. Left upper extremity weakness: This appears to be a function of the reported pain which results in significant give way on strength examination. With encouragement, he actually has normal strength throughout.  This was discussed with the patient and his wife. They are in agreement with the plan as noted. They were given the opportunity to ask questions and these were addressed to their satisfaction.  I discussed my findings with Dr. Clayborne Dana at the time of my consultation. At this point, I have no additional recommendations and he is clear for discharge from the emergency department from the neurology perspective.

## 2015-10-28 NOTE — ED Notes (Signed)
E-signature will not load. Discussed d/c instructions, prescriptions and follow up with pt. Pt and pt's wife have no questions at this time. D/C home with steady ambulation.

## 2015-10-28 NOTE — ED Notes (Signed)
Pt ambulated to restroom. Pt tolerated well.  

## 2015-10-28 NOTE — ED Notes (Signed)
Ambulated Pt. Pt's initial SpO2 was 97% and pulse was 94 beats per minute. Upon ambulating, Pt's SpO2 fluctuated between 94-97% and pulse fluctuated between 94-100 beats per minute. Pt reported feeling "slightly dizzy".

## 2015-10-28 NOTE — ED Triage Notes (Addendum)
Pt in from home with c/o sharp L sided chest pain, radiates to L neck/arm. Pt is diaphoretic, denies n/v or sob.  While walking to room, pt c/o LUE weakness that started last night. Gait steady, face equal, grips equal bilat., speech clear. Hx of CVA and seizure in 01' with no residual. Pt alert

## 2015-10-28 NOTE — ED Notes (Signed)
Pt d/c home with wife

## 2015-11-09 ENCOUNTER — Ambulatory Visit (INDEPENDENT_AMBULATORY_CARE_PROVIDER_SITE_OTHER): Payer: 59 | Admitting: Neurology

## 2015-11-09 ENCOUNTER — Encounter: Payer: Self-pay | Admitting: Neurology

## 2015-11-09 VITALS — BP 116/74 | HR 107 | Ht 70.0 in | Wt 266.0 lb

## 2015-11-09 DIAGNOSIS — M5412 Radiculopathy, cervical region: Secondary | ICD-10-CM

## 2015-11-09 MED ORDER — GABAPENTIN 300 MG PO CAPS: 300.0000 mg | ORAL_CAPSULE | Freq: Three times a day (TID) | ORAL | 0 refills | Status: DC

## 2015-11-09 MED ORDER — PREDNISONE 10 MG PO TABS: ORAL_TABLET | ORAL | 0 refills | Status: DC

## 2015-11-09 NOTE — Progress Notes (Signed)
Chart forwarded.  

## 2015-11-09 NOTE — Progress Notes (Signed)
Referral placed to [facility]. [facility] did not have a Careers advisersurgeon.

## 2015-11-09 NOTE — Patient Instructions (Signed)
1.  To help reduce inflammation, I will prescribe you a prednisone taper (10mg  tablets.  Take 6tabs x1day, then 5tabs x1day, then 4tabs x1day, then 3tabs x1day, then 2tabs x1day, then 1tab x1day, then STOP) 2.  I will prescribe you gabapentin 300mg  three times daily for nerve pain. 3.  We will refer you to the spine surgeon at [facility].  I would like you to contact Dr. Laurian BrimBai as well. 4.  Follow up as needed.

## 2015-11-09 NOTE — Progress Notes (Signed)
NEUROLOGY CONSULTATION NOTE  [name] MRN: [id number] DOB: [birth year]  Referring provider: Dr. Jillyn HiddenFulp Primary care provider: Dr. Jillyn HiddenFulp  Reason for consult:  Neck pain, left sided pain and weakness  HISTORY OF PRESENT ILLNESS: [name] is a 45 year old right-handed man with DM, HLD and history of stroke and seizure disorder who presents for cervical radiculopathy.  History obtained by patient, his wife (who accompanies him) and ED note.  A couple of weeks ago, he developed sudden but gradually severe pain in the left posterior neck, shoulder and chest, radiating down the lateral left arm to just below the elbow.  He noted numbness in the hands, as well as some weakness.  He has chronic low back pain, for which he is treated by pain medicine and has noted increased pain in the knees and left leg.  He is unable to move his neck due to pain.  He went to the ED on 10/28/15 for further evaluation.  Due to weakness, a stroke was considered but tPA not administered as patient was outside of window.  ECG and troponins were negative.  CT and MRI of brain were personally reviewed and were negative.  He had an MRI of the cervical spine performed on 10/30/15, which was personally reviewed and revealed a large disc extrusion at C6-7, impinging the left aspect of the spinal cord and left C7 nerve root.  He also had a right paracentral/foraminal disc osteophyte complex at C5-6.  He was given cyclobenzaprine and hydrocodone.  He denies any preceding injury or strenuous activity.   PAST MEDICAL HISTORY: Past Medical History:  Diagnosis Date  . Adrenal abnormality (HCC)   . Allergy   . Atypical chest pain   . BPH (benign prostatic hypertrophy)   . DM (diabetes mellitus) (HCC)   . GERD (gastroesophageal reflux disease)   . Gout   . H/O: CVA (cardiovascular accident)   . Hyperlipidemia   . Knee pain, bilateral   . LBP (low back pain)   . OSA (obstructive sleep apnea)   . Plantar fasciitis    followed by Dr Harriet PhoAjlouny  . Seizure disorder (HCC)     PAST SURGICAL HISTORY: Past Surgical History:  Procedure Laterality Date  . left heel surgery     x2  . right shoulder surgery     x2  . TRIGGER FINGER RELEASE Right 05/14/2015   Procedure: RIGHT THUMB TRIGGER RELEASE ;  Surgeon: Betha LoaKevin Kuzma, MD;  Location: [facility];  Service: Orthopedics;  Laterality: Right;    MEDICATIONS: Current Outpatient Prescriptions on File Prior to Visit  Medication Sig Dispense Refill  . buPROPion (WELLBUTRIN SR) 150 MG 12 hr tablet Take 150 mg by mouth 2 (two) times daily as needed (mood).    . cetirizine (ZYRTEC) 10 MG tablet Take 10 mg by mouth every other day.    . cyclobenzaprine (FLEXERIL) 10 MG tablet Take 1 tablet (10 mg total) by mouth 2 (two) times daily as needed for muscle spasms. 20 tablet 0  . escitalopram (LEXAPRO) 20 MG tablet Take 20 mg by mouth daily. Once daily  0  . ibuprofen (ADVIL,MOTRIN) 400 MG tablet Take 1 tablet (400 mg total) by mouth every 6 (six) hours as needed. 30 tablet 0  . metFORMIN (GLUCOPHAGE) 500 MG tablet TAKE 2 TABLETS BY MOUTH TWICE A DAY WITH A MEAL 180 tablet 1  . methocarbamol (ROBAXIN) 500 MG tablet Take 500 mg by mouth every 6 (six) hours as needed for  muscle spasms.    . naproxen sodium (ANAPROX) 220 MG tablet Take 440 mg by mouth as needed (back pain).    . rosuvastatin (CRESTOR) 20 MG tablet Take 1 tablet (20 mg total) by mouth daily. 90 tablet 3  . traZODone (DESYREL) 100 MG tablet Take 100 mg by mouth at bedtime as needed for sleep.   0   No current facility-administered medications on file prior to visit.     ALLERGIES: Allergies  Allergen Reactions  . Atorvastatin Swelling    Tingling, numbness.    Marland Kitchen Cymbalta [Duloxetine Hcl]     Stomach upset, difficulty urinating, decreased libido    FAMILY HISTORY: Family History  Problem Relation Age of Onset  . Diabetes Father   . Hypertension Father   . Heart attack Maternal Grandfather    . Cancer Maternal Uncle   . Allergies Daughter     SOCIAL HISTORY: Social History   Social History  . Marital status: Married    Spouse name: N/A  . Number of children: 2  . Years of education: N/A   Occupational History  . Production designer, theatre/television/film and clergy    Social History Main Topics  . Smoking status: Former Smoker    Years: 4.00    Types: Cigarettes    Quit date: 12/22/1990  . Smokeless tobacco: Never Used     Comment: 1-2 packs a week  . Alcohol use No  . Drug use: No  . Sexual activity: Not on file   Other Topics Concern  . Not on file   Social History Narrative   Works for [employer]    REVIEW OF SYSTEMS: Constitutional: No fevers, chills, or sweats, no generalized fatigue, change in appetite Eyes: No visual changes, double vision, eye pain Ear, nose and throat: No hearing loss, ear pain, nasal congestion, sore throat Cardiovascular: No chest pain, palpitations Respiratory:  No shortness of breath at rest or with exertion, wheezes GastrointestinaI: No nausea, vomiting, diarrhea, abdominal pain, fecal incontinence Genitourinary:  No dysuria, urinary retention or frequency Musculoskeletal:  No neck pain, back pain Integumentary: No rash, pruritus, skin lesions Neurological: as above Psychiatric: No depression, insomnia, anxiety Endocrine: No palpitations, fatigue, diaphoresis, mood swings, change in appetite, change in weight, increased thirst Hematologic/Lymphatic:  No purpura, petechiae. Allergic/Immunologic: no itchy/runny eyes, nasal congestion, recent allergic reactions, rashes  PHYSICAL EXAM: Vitals:   11/09/15 0947  BP: 116/74  Pulse: (!) 107   General: No acute distress.  Patient appears well-groomed.  Head:  Normocephalic/atraumatic Eyes:  fundi examined but not visualized Neck: supple, left paraspinal tenderness, decreased range of motion in all directions Back: mild bilateral paraspinal tenderness Heart: regular rate and rhythm Lungs:  Clear to auscultation bilaterally. Vascular: No carotid bruits. Neurological Exam: Mental status: alert and oriented to person, place, and time, recent and remote memory intact, fund of knowledge intact, attention and concentration intact, speech fluent and not dysarthric, language intact. Cranial nerves: CN I: not tested CN II: pupils equal, round and reactive to light, visual fields intact CN III, IV, VI:  full range of motion, no nystagmus, no ptosis CN V: endorses mildly reduced left V1-V3 CN VII: upper and lower face symmetric CN VIII: hearing intact CN IX, X: gag intact, uvula midline CN XI: sternocleidomastoid and trapezius muscles intact CN XII: tongue midline Bulk & Tone: normal, no fasciculations. Motor:  4-/5 left upper extremity which may be limited due to pain.  Also 4/5 left hand grip.  Pain in left lower extremity but 5/5  with encouragement.  Otherwise, 5/5. Sensation:  Decreased pinprick sensation in first 3 digits of left hand and lateral arm. Deep Tendon Reflexes:  Trace in upper extremities, 2+ lower extremities,  toes downgoing. Finger to nose testing:  Without dysmetria.  Heel to shin:  Without dysmetria.  Gait:  Normal station and stride.  Able to turn and tandem walk. Romberg negative.  IMPRESSION: Left C7 radiculopathy Of note, he also endorses mildly reduced left facial numbness, which I do not have an explanation.  PLAN: 1.  Due to severity of pain and questionable weakness in grip, will need surgical evaluation. 2.  Will prescribe prednisone taper and gabapentin 300mg  three times daily. 3.  Continue cyclobenzaprine for muscle spasms. 4.  Should also follow up with his pain physician as well. 5.  Follow up with me as needed.  Thank you for allowing me to take part in the care of this patient.  Shon Millet, DO  CC:  Cain Saupe, MD

## 2015-12-30 ENCOUNTER — Ambulatory Visit: Payer: 59 | Admitting: Neurology

## 2016-02-08 DIAGNOSIS — G894 Chronic pain syndrome: Secondary | ICD-10-CM | POA: Diagnosis not present

## 2016-02-08 DIAGNOSIS — M47816 Spondylosis without myelopathy or radiculopathy, lumbar region: Secondary | ICD-10-CM | POA: Diagnosis not present

## 2016-02-10 DIAGNOSIS — Z0189 Encounter for other specified special examinations: Secondary | ICD-10-CM | POA: Diagnosis not present

## 2016-02-10 DIAGNOSIS — M7732 Calcaneal spur, left foot: Secondary | ICD-10-CM | POA: Diagnosis not present

## 2016-02-10 DIAGNOSIS — M7731 Calcaneal spur, right foot: Secondary | ICD-10-CM | POA: Diagnosis not present

## 2016-02-11 DIAGNOSIS — M545 Low back pain: Secondary | ICD-10-CM | POA: Diagnosis not present

## 2016-02-23 DIAGNOSIS — M545 Low back pain: Secondary | ICD-10-CM | POA: Diagnosis not present

## 2016-02-23 DIAGNOSIS — E785 Hyperlipidemia, unspecified: Secondary | ICD-10-CM | POA: Diagnosis not present

## 2016-02-23 DIAGNOSIS — G4733 Obstructive sleep apnea (adult) (pediatric): Secondary | ICD-10-CM | POA: Diagnosis not present

## 2016-02-23 DIAGNOSIS — E119 Type 2 diabetes mellitus without complications: Secondary | ICD-10-CM | POA: Diagnosis not present

## 2016-02-24 DIAGNOSIS — R5383 Other fatigue: Secondary | ICD-10-CM | POA: Diagnosis not present

## 2016-02-24 DIAGNOSIS — G894 Chronic pain syndrome: Secondary | ICD-10-CM | POA: Diagnosis not present

## 2016-02-25 DIAGNOSIS — E119 Type 2 diabetes mellitus without complications: Secondary | ICD-10-CM | POA: Diagnosis not present

## 2016-02-25 DIAGNOSIS — R5383 Other fatigue: Secondary | ICD-10-CM | POA: Diagnosis not present

## 2016-03-01 DIAGNOSIS — Z1211 Encounter for screening for malignant neoplasm of colon: Secondary | ICD-10-CM | POA: Diagnosis not present

## 2016-03-03 DIAGNOSIS — D509 Iron deficiency anemia, unspecified: Secondary | ICD-10-CM | POA: Diagnosis not present

## 2016-03-03 DIAGNOSIS — Z800 Family history of malignant neoplasm of digestive organs: Secondary | ICD-10-CM | POA: Diagnosis not present

## 2016-03-09 DIAGNOSIS — R6889 Other general symptoms and signs: Secondary | ICD-10-CM | POA: Diagnosis not present

## 2016-03-15 DIAGNOSIS — M79672 Pain in left foot: Secondary | ICD-10-CM | POA: Diagnosis not present

## 2016-03-15 DIAGNOSIS — M79671 Pain in right foot: Secondary | ICD-10-CM | POA: Diagnosis not present

## 2016-03-21 ENCOUNTER — Other Ambulatory Visit: Payer: Self-pay | Admitting: Cardiovascular Disease

## 2016-03-28 DIAGNOSIS — R5383 Other fatigue: Secondary | ICD-10-CM | POA: Diagnosis not present

## 2016-03-28 DIAGNOSIS — G4733 Obstructive sleep apnea (adult) (pediatric): Secondary | ICD-10-CM | POA: Diagnosis not present

## 2016-04-11 DIAGNOSIS — D509 Iron deficiency anemia, unspecified: Secondary | ICD-10-CM | POA: Diagnosis not present

## 2016-04-11 DIAGNOSIS — K5730 Diverticulosis of large intestine without perforation or abscess without bleeding: Secondary | ICD-10-CM | POA: Diagnosis not present

## 2016-04-19 DIAGNOSIS — E291 Testicular hypofunction: Secondary | ICD-10-CM | POA: Diagnosis not present

## 2016-04-26 DIAGNOSIS — E291 Testicular hypofunction: Secondary | ICD-10-CM | POA: Diagnosis not present

## 2016-05-05 DIAGNOSIS — E291 Testicular hypofunction: Secondary | ICD-10-CM | POA: Diagnosis not present

## 2016-05-25 DIAGNOSIS — E291 Testicular hypofunction: Secondary | ICD-10-CM | POA: Diagnosis not present

## 2016-05-25 DIAGNOSIS — N400 Enlarged prostate without lower urinary tract symptoms: Secondary | ICD-10-CM | POA: Diagnosis not present

## 2016-06-20 DIAGNOSIS — Z0001 Encounter for general adult medical examination with abnormal findings: Secondary | ICD-10-CM | POA: Diagnosis not present

## 2016-06-30 DIAGNOSIS — E291 Testicular hypofunction: Secondary | ICD-10-CM | POA: Diagnosis not present

## 2016-06-30 DIAGNOSIS — G4733 Obstructive sleep apnea (adult) (pediatric): Secondary | ICD-10-CM | POA: Diagnosis not present

## 2016-07-06 DIAGNOSIS — E291 Testicular hypofunction: Secondary | ICD-10-CM | POA: Diagnosis not present

## 2016-07-07 ENCOUNTER — Encounter (HOSPITAL_COMMUNITY): Payer: Self-pay | Admitting: Emergency Medicine

## 2016-07-07 ENCOUNTER — Ambulatory Visit (HOSPITAL_COMMUNITY)
Admission: EM | Admit: 2016-07-07 | Discharge: 2016-07-07 | Disposition: A | Discharge status: Home / Self Care | Payer: 59 | Attending: Internal Medicine | Admitting: Internal Medicine

## 2016-07-07 DIAGNOSIS — M25561 Pain in right knee: Secondary | ICD-10-CM

## 2016-07-07 MED ORDER — KETOROLAC TROMETHAMINE 60 MG/2ML IM SOLN
60.0000 mg | Freq: Once | INTRAMUSCULAR | Status: AC
  Administered 2016-07-07: 60 mg via INTRAMUSCULAR

## 2016-07-07 MED ORDER — KETOROLAC TROMETHAMINE 60 MG/2ML IM SOLN
INTRAMUSCULAR | Status: AC
  Filled 2016-07-07: qty 2

## 2016-07-07 MED ORDER — NAPROXEN 500 MG PO TABS: 500.0000 mg | ORAL_TABLET | Freq: Two times a day (BID) | ORAL | 0 refills | Status: DC

## 2016-07-07 NOTE — ED Provider Notes (Signed)
[facility]    CSN: [id number] Arrival date & time: 07/07/16  1201     History   Chief Complaint Chief Complaint  Patient presents with  . Knee Pain    HPI [name] is a 46 y.o. male. He presents today with pain in the anterior right knee. This started yesterday but is more severe today. No trauma, no unusual activities. Does have a history of intermittent knee pain and sometimes uses a knee sleeve.    HPI  Past Medical History:  Diagnosis Date  . Adrenal abnormality (HCC)   . Allergy   . Atypical chest pain   . BPH (benign prostatic hypertrophy)   . DM (diabetes mellitus) (HCC)   . GERD (gastroesophageal reflux disease)   . Gout   . H/O: CVA (cardiovascular accident)   . Hyperlipidemia   . Knee pain, bilateral   . LBP (low back pain)   . OSA (obstructive sleep apnea)   . Plantar fasciitis    followed by Dr Harriet PhoAjlouny  . Seizure disorder [facility](HCC)     Patient Active Problem List   Diagnosis Date Noted  . Behaviorally induced insufficient sleep syndrome 07/15/2015  . Atypical chest pain 03/05/2015  . Facial pain 08/28/2013  . Obesity 03/15/2013  . Numbness and tingling in left hand 11/15/2012  . BPH (benign prostatic hypertrophy) 11/15/2012  . Right elbow pain 02/07/2012  . Lumbar facet arthropathy (HCC) 08/02/2011  . Lumbar spondylosis 08/02/2011  . Quadriceps tendonitis 08/02/2011  . Eustachian tube dysfunction 07/22/2011  . Type II or unspecified type diabetes mellitus without mention of complication, not stated as uncontrolled 12/22/2010  . Preventative health care 12/22/2010  . Foot pain 12/22/2010  . OBESITY, UNSPECIFIED 03/16/2009  . KNEE PAIN, BILATERAL 12/08/2008  . THUMB PAIN, LEFT 06/12/2007  . SHOULDER PAIN, RIGHT 02/15/2007  . HYPERLIPIDEMIA 08/23/2006  . GOUT 08/23/2006  . Obstructive sleep apnea 08/23/2006  . ALLERGIC RHINITIS 08/23/2006  . ARTHRALGIA 08/23/2006  . LOW BACK PAIN 08/23/2006  . PLANTAR FASCIITIS 08/23/2006    . SEIZURE DISORDER 08/23/2006  . CEREBROVASCULAR ACCIDENT, HX OF 08/23/2006    Past Surgical History:  Procedure Laterality Date  . left heel surgery     x2  . right shoulder surgery     x2  . TRIGGER FINGER RELEASE Right 05/14/2015   Procedure: RIGHT THUMB TRIGGER RELEASE ;  Surgeon: Betha LoaKevin Kuzma, MD;  Location: [facility];  Service: Orthopedics;  Laterality: Right;       Home Medications    Prior to Admission medications   Medication Sig Start Date End Date Taking? Authorizing Provider  clomiPHENE (CLOMID) 50 MG tablet Take by mouth daily.   Yes [provider]  cetirizine (ZYRTEC) 10 MG tablet Take 10 mg by mouth every other day.    [provider]  cyclobenzaprine (FLEXERIL) 10 MG tablet Take 1 tablet (10 mg total) by mouth 2 (two) times daily as needed for muscle spasms. 10/28/15   Mesner, Barbara CowerJason, MD  escitalopram (LEXAPRO) 20 MG tablet Take 20 mg by mouth daily. Once daily 01/07/14   [provider]  gabapentin (NEURONTIN) 300 MG capsule Take 1 capsule (300 mg total) by mouth 3 (three) times daily. 11/09/15   Drema DallasJaffe, Adam R, DO  HYDROcodone-acetaminophen (NORCO/VICODIN) 5-325 MG tablet  10/29/15   [provider]  ibuprofen (ADVIL,MOTRIN) 400 MG tablet Take 1 tablet (400 mg total) by mouth every 6 (six) hours as needed. 10/28/15   Mesner, Barbara CowerJason, MD  metFORMIN (GLUCOPHAGE) 500 MG tablet TAKE 2 TABLETS BY MOUTH TWICE A DAY WITH A MEAL 05/14/15   Yoo, Doe-Hyun R, DO  methocarbamol (ROBAXIN) 500 MG tablet Take 500 mg by mouth every 6 (six) hours as needed for muscle spasms.    [provider]  naproxen (NAPROSYN) 500 MG tablet Take 1 tablet (500 mg total) by mouth 2 (two) times daily. 07/07/16   Eustace Moore, MD  rosuvastatin (CRESTOR) 20 MG tablet TAKE 1 TABLET (20 MG TOTAL) BY MOUTH DAILY. 03/21/16   Runell Gess, MD  traZODone (DESYREL) 100 MG tablet Take 100 mg by mouth at bedtime as needed for sleep.  05/09/14   [provider]    Family History Family History  Problem Relation Age of Onset  . Diabetes Father   . Hypertension Father   . Heart attack Maternal Grandfather   . Cancer Maternal Uncle   . Allergies Daughter     Social History Social History  Substance Use Topics  . Smoking status: Former Smoker    Years: 4.00    Types: Cigarettes    Quit date: 12/22/1990  . Smokeless tobacco: Never Used     Comment: 1-2 packs a week  . Alcohol use No     Allergies   Atorvastatin and Cymbalta [duloxetine hcl]   Review of Systems Review of Systems  All other systems reviewed and are negative.    Physical Exam Triage Vital Signs ED Triage Vitals  Enc Vitals Group     BP 07/07/16 1242 121/79     Pulse Rate 07/07/16 1242 98     Resp 07/07/16 1242 20     Temp 07/07/16 1242 98.5 F (36.9 C)     Temp Source 07/07/16 1242 Oral     SpO2 07/07/16 1242 98 %     Weight --      Height --      Head Circumference --      Peak Flow --      Pain Score 07/07/16 1239 7     Pain Loc --    Updated Vital Signs BP 121/79 (BP Location: Right Arm) Comment (BP Location): large cuff  Pulse 98   Temp 98.5 F (36.9 C) (Oral)   Resp 20   SpO2 98%   Physical Exam  Constitutional: He is oriented to person, place, and time. No distress.  Alert, nicely groomed  HENT:  Head: Atraumatic.  Eyes:  Conjugate gaze, no eye redness/drainage  Neck: Neck supple.  Cardiovascular: Normal rate.   Pulmonary/Chest: No respiratory distress.  Abdominal: He exhibits no distension.  Musculoskeletal: Normal range of motion.  Tenderness to light palpation over the anterior knee/patella and the distal patellar tendon. Able to fully flex the knee and extend it, but it is painful. No knee effusion appreciated. Not warm to touch, no erythema, no bruising. Into the urgent care independently, and climb on/off exam table  Neurological: He is alert and oriented to person, place, and time.  Skin: Skin is warm and dry.    No cyanosis  Nursing note and vitals reviewed.    UC Treatments / Results   Procedures Procedures (including critical care time)  Medications Ordered in UC Medications  ketorolac (TORADOL) injection 60 mg (60 mg Intramuscular Given 07/07/16 1320)    Final Clinical Impressions(s) / UC Diagnoses   Final diagnoses:  Acute pain of right knee   Pain in the front of the knee today seems most likely to be due  to an inflammatory process, like tendinitis or bursitis. Continue with ice for 5-10 minutes several times daily as you are. A knee brace or sleeve may provide additional relief. Injection of ketorolac, anti-inflammatory/pain reliever, was given at the urgent care today. Prescription for Naprosyn, anti-inflammatory/pain reliever, was sent to the pharmacy. Anticipate gradual improvement in knee discomfort over the next several days. Recheck or follow up with your primary care provider if symptoms are not improving, or for increasing pain/redness/swelling.    New Prescriptions Discharge Medication List as of 07/07/2016  1:19 PM    START taking these medications   Details  naproxen (NAPROSYN) 500 MG tablet Take 1 tablet (500 mg total) by mouth 2 (two) times daily., Starting Thu 07/07/2016, Normal         Eustace Moore, MD 07/09/16 1034

## 2016-07-07 NOTE — ED Triage Notes (Signed)
Right knee pain that started this morning.  Any movement of right knee is painful whether weight bearing or not weight bearing

## 2016-07-07 NOTE — Discharge Instructions (Addendum)
Pain in the front of the knee today seems most likely to be due to an inflammatory process, like tendinitis or bursitis. Continue with ice for 5-10 minutes several times daily as you are. A knee brace or sleeve may provide additional relief. Injection of ketorolac, anti-inflammatory/pain reliever, was given at the urgent care today. Prescription for Naprosyn, anti-inflammatory/pain reliever, was sent to the pharmacy. Anticipate gradual improvement in knee discomfort over the next several days. Recheck or follow up with your primary care provider if symptoms are not improving, or for increasing pain/redness/swelling.

## 2016-08-18 ENCOUNTER — Ambulatory Visit: Payer: 59 | Admitting: Family Medicine

## 2016-08-18 DIAGNOSIS — Z0289 Encounter for other administrative examinations: Secondary | ICD-10-CM

## 2016-08-22 DIAGNOSIS — G4733 Obstructive sleep apnea (adult) (pediatric): Secondary | ICD-10-CM | POA: Diagnosis not present

## 2016-08-23 DIAGNOSIS — Z7984 Long term (current) use of oral hypoglycemic drugs: Secondary | ICD-10-CM | POA: Diagnosis not present

## 2016-08-23 DIAGNOSIS — E119 Type 2 diabetes mellitus without complications: Secondary | ICD-10-CM | POA: Diagnosis not present

## 2016-08-30 ENCOUNTER — Encounter: Payer: Self-pay | Admitting: Family Medicine

## 2016-08-30 ENCOUNTER — Encounter: Payer: Self-pay | Admitting: Surgical

## 2016-08-30 ENCOUNTER — Ambulatory Visit (INDEPENDENT_AMBULATORY_CARE_PROVIDER_SITE_OTHER): Payer: 59 | Admitting: Family Medicine

## 2016-08-30 VITALS — BP 110/88 | HR 93 | Ht 70.0 in | Wt 264.0 lb

## 2016-08-30 DIAGNOSIS — G894 Chronic pain syndrome: Secondary | ICD-10-CM

## 2016-08-30 DIAGNOSIS — M5441 Lumbago with sciatica, right side: Secondary | ICD-10-CM | POA: Diagnosis not present

## 2016-08-30 DIAGNOSIS — G4733 Obstructive sleep apnea (adult) (pediatric): Secondary | ICD-10-CM

## 2016-08-30 DIAGNOSIS — M722 Plantar fascial fibromatosis: Secondary | ICD-10-CM

## 2016-08-30 DIAGNOSIS — E119 Type 2 diabetes mellitus without complications: Secondary | ICD-10-CM

## 2016-08-30 DIAGNOSIS — G8929 Other chronic pain: Secondary | ICD-10-CM

## 2016-08-30 DIAGNOSIS — M5442 Lumbago with sciatica, left side: Secondary | ICD-10-CM

## 2016-08-30 DIAGNOSIS — M25562 Pain in left knee: Secondary | ICD-10-CM

## 2016-08-30 DIAGNOSIS — M25561 Pain in right knee: Secondary | ICD-10-CM

## 2016-08-30 DIAGNOSIS — Z8673 Personal history of transient ischemic attack (TIA), and cerebral infarction without residual deficits: Secondary | ICD-10-CM

## 2016-08-30 DIAGNOSIS — G40909 Epilepsy, unspecified, not intractable, without status epilepticus: Secondary | ICD-10-CM

## 2016-08-30 DIAGNOSIS — E782 Mixed hyperlipidemia: Secondary | ICD-10-CM

## 2016-08-30 DIAGNOSIS — Z6837 Body mass index (BMI) 37.0-37.9, adult: Secondary | ICD-10-CM

## 2016-08-30 DIAGNOSIS — J301 Allergic rhinitis due to pollen: Secondary | ICD-10-CM | POA: Diagnosis not present

## 2016-08-30 MED ORDER — OXYCODONE-ACETAMINOPHEN 10-325 MG PO TABS: 1.0000 | ORAL_TABLET | Freq: Three times a day (TID) | ORAL | 0 refills | Status: DC | PRN

## 2016-08-30 NOTE — Progress Notes (Signed)
[name] is a 46 y.o. male is here to [name].   Patient Care Team: Mark Rima, DO as PCP - [facility] (Family Medicine)   History of Present Illness:   [name], CMA acting as scribe for Dr. Earlene Hodge. [name], CMA acting as a Neurosurgeon for Dr. Earlene Hodge.   HPI: Patient comes in today to establish with Dr. Earlene Hodge. Patient also goes to the [name] for some of his medical problems. He was in the [name]	[place] for 8 years. 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 He is on his feet a lot for work.   Diabetes: Taking Metformin 500mg  2 tablets twice per day. Checks Hodge blood sugars. Last check was fasting with a reading of 90. Current labs from [name]: MA: 6.3 and A1C: 6.4. He will send labs to [name] for specifics. He does have excessive thirst and frequency urination. No reports low blood sugar readings.  His normal range is 70-90s.  There was an episode approx 1 month of elevated sugar in the 140s.    Chronic bilateral knee pain:  Persistent for years.  Xray of right knee done in 2007. He alternates, naproxen, hydrocodone, and ibuprofen with some relief. He saw Orthopedic MD and ortho [name] last year.   Back Pain:  This as been a chronic issue for him for several years. He is a retired [name]. 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  No surgeries. Location of pain is lower left back with radiation down left leg. He gets numbness and tingling in the feet. At times pain will go all the way down to his feet. He is alternating flexeril and robaxin with some relief. He takes Hydrocodone for his pain.  Will refer to neurosurgery today.   Health Maintenance Due  Topic Date Due  . HIV Screening  06/11/1984  . HEMOGLOBIN A1C  08/08/2014  . URINE MICROALBUMIN  02/08/2015  . FOOT EXAM  03/20/2015  . PNEUMOCOCCAL POLYSACCHARIDE VACCINE (2) 12/22/2015  . OPHTHALMOLOGY EXAM  02/27/2016  . INFLUENZA VACCINE  08/31/2016   PMHx,  SurgHx, SocialHx, Medications, and Allergies were reviewed in the Visit Navigator and updated as appropriate.   Past Medical History:  Diagnosis Date  . Adrenal abnormality ([name])   . Allergy   . Atypical chest pain   . BPH (benign prostatic hypertrophy)   . DM (diabetes mellitus) ([name])   . GERD (gastroesophageal reflux disease)   . Gout   . H/O: [name] (cardiovascular accident)   . Hyperlipidemia   . Knee pain, bilateral   . LBP (low back pain)   . OSA (obstructive sleep apnea)   . Plantar fasciitis   . Seizure disorder [name])    Past Surgical History:  Procedure Laterality Date  . HEEL SPUR Mark Left   . NECK Mark     Titanium Plate in Vertebrae  . SHOULDER Mark Right   . TRIGGER FINGER RELEASE Right 05/14/2015   Procedure: RIGHT THUMB TRIGGER RELEASE ;  Surgeon: Mark Loa, MD;  Location:  [name];  Service: Orthopedics;  Laterality: Right;   Family History  Problem Relation Age of Onset  . Diabetes Father   . Hypertension Father   . Heart attack Maternal Grandfather   . Cancer Maternal Uncle   . Allergies Daughter    Social History  Substance Use Topics  . Smoking status: Former Smoker    Years: 4.00    Types: Cigarettes    Quit date: 12/22/1990  .  Smokeless tobacco: Never Used     Comment: 1-2 packs a week  . Alcohol use No   Current Medications and Allergies:   .  cetirizine (ZYRTEC) 10 MG tablet, Take 10 mg by mouth every other day., Disp: , Rfl:  .  clomiPHENE (CLOMID) 50 MG tablet, Take by mouth daily., Disp: , Rfl:  .  escitalopram (LEXAPRO) 20 MG tablet, Take 20 mg by mouth daily. Once daily, Disp: , Rfl: 0 .  gabapentin (NEURONTIN) 300 MG capsule, Take 1 capsule (300 mg total) by mouth 3 (three) times daily., Disp: 90 capsule, Rfl: 0 .  metFORMIN (GLUCOPHAGE) 500 MG tablet, TAKE 2 TABLETS BY MOUTH TWICE A DAY WITH A MEAL, Disp: 180 tablet, Rfl: 1 .  methocarbamol (ROBAXIN) 500 MG tablet, Take 500 mg by mouth every 6 (six) hours as  needed for muscle spasms., Disp: , Rfl:  .  naproxen (NAPROSYN) 500 MG tablet, Take 1 tablet (500 mg total) by mouth 2 (two) times daily., Disp: 30 tablet, Rfl: 0 .  rosuvastatin (CRESTOR) 20 MG tablet, TAKE 1 TABLET (20 MG TOTAL) BY MOUTH DAILY., Disp: 90 tablet, Rfl: 2 .  traZODone (DESYREL) 100 MG tablet, Take 100 mg by mouth at bedtime as needed for sleep. , Disp: , Rfl: 0  Allergies  Allergen Reactions  . Atorvastatin Swelling  . Cymbalta [Duloxetine Hcl]     Stomach upset, difficulty urinating, decreased libido.   Review of Systems:   Pertinent items are noted in the HPI. Otherwise, ROS is negative.  Vitals:   Vitals:   08/30/16 1350  BP: 110/88  Pulse: 93  SpO2: 95%  Weight: 264 lb (119.7 kg)  Height: 5\' 10"  (1.778 m)     Body mass index is 37.88 kg/m.  Physical Exam:   Physical Exam  Constitutional: He is oriented to person, place, and time. He appears well-developed and well-nourished. No distress.  HENT:  Head: Normocephalic and atraumatic.  Right Ear: External ear normal.  Left Ear: External ear normal.  Nose: Nose normal.  Mouth/Throat: Oropharynx is clear and moist.  Eyes: Pupils are equal, round, and reactive to light. Conjunctivae and EOM are normal.  Neck: Normal range of motion. Neck supple.  Cardiovascular: Normal rate, regular rhythm, normal heart sounds and intact distal pulses.   Pulmonary/Chest: Effort normal and breath sounds normal.  Abdominal: Soft. Bowel sounds are normal.  Musculoskeletal: Normal range of motion.  Neurological: He is alert and oriented to person, place, and time.  Skin: Skin is warm and dry.  Psychiatric: He has a normal mood and affect. His behavior is normal. Judgment and thought content normal.  Nursing note and vitals reviewed.  Results for orders placed or performed during the hospital encounter of 10/28/15  CBC  Result Value Ref Range   WBC 7.1 4.0 - 10.5 K/uL   RBC 5.84 (H) 4.22 - 5.81 MIL/uL   Hemoglobin 15.2  13.0 - 17.0 g/dL   HCT 16.146.0 09.639.0 - 04.552.0 %   MCV 78.8 78.0 - 100.0 fL   MCH 26.0 26.0 - 34.0 pg   MCHC 33.0 30.0 - 36.0 g/dL   RDW 40.915.7 (H) 81.111.5 - 91.415.5 %   Platelets 219 150 - 400 K/uL  Differential  Result Value Ref Range   Neutrophils Relative % 57 %   Neutro Abs 4.0 1.7 - 7.7 K/uL   Lymphocytes Relative 33 %   Lymphs Abs 2.3 0.7 - 4.0 K/uL   Monocytes Relative 7 %   Monocytes Absolute  0.5 0.1 - 1.0 K/uL   Eosinophils Relative 3 %   Eosinophils Absolute 0.2 0.0 - 0.7 K/uL   Basophils Relative 0 %   Basophils Absolute 0.0 0.0 - 0.1 K/uL  Comprehensive metabolic panel  Result Value Ref Range   Sodium 138 135 - 145 mmol/L   Potassium 4.2 3.5 - 5.1 mmol/L   Chloride 103 101 - 111 mmol/L   CO2 25 22 - 32 mmol/L   Glucose, Bld 107 (H) 65 - 99 mg/dL   BUN 9 6 - 20 mg/dL   Creatinine, Ser 1.610.99 0.61 - 1.24 mg/dL   Calcium 9.4 8.9 - 09.610.3 mg/dL   Total Protein 7.1 6.5 - 8.1 g/dL   Albumin 3.8 3.5 - 5.0 g/dL   AST 21 15 - 41 U/L   ALT 13 (L) 17 - 63 U/L   Alkaline Phosphatase 54 38 - 126 U/L   Total Bilirubin 0.5 0.3 - 1.2 mg/dL   GFR calc non Af Amer >60 >60 mL/min   GFR calc Af Amer >60 >60 mL/min   Anion gap 10 5 - 15   Assessment and Plan:   Problem  Obesity   The patient is asked to make an attempt to improve diet and exercise patterns to aid in medical management of this problem.    Type 2 Diabetes Mellitus Without Complication, Without Long-Term Current Use of Insulin ([name])   Current symptoms: no polyuria or polydipsia, no chest pain, dyspnea or TIA's.  Taking medication compliantly without noted sided effects [x]   YES  []   NO  Maintaining a diabetic diet? [x]   YES  []   NO Trying to exercise on a regular basis? [x]   YES  []   NO  On ACE inhibitor or angiotensin II receptor blocker? []   YES  []   NO On Aspirin? []   YES  [x]   NO  Lab Results  Component Value Date   HGBA1C 6.4 (H) 2018    Wt Readings from Last 3 Encounters:  08/30/16 264 lb (119.7 kg)  11/09/15 266  lb (120.7 kg)  10/28/15 257 lb (116.6 kg)   BP Readings from Last 3 Encounters:  08/30/16 110/88  07/07/16 121/79  11/09/15 116/74   Lab Results  Component Value Date   CREATININE 0.90 10/28/2015     Chronic Pain of Both Knees   Severe. Daily pain 8/10, down to 7/10 with antiinflammatory. Interfering with life. Limited exercise due to pain. After discussion, patient would like to start below medication. Expectations, risks, and potential side effects reviewed. Will start with below medication to be used as sparingly as possible. Will reevaluated in one month.  REVIEW OF PAIN MEDICATION CONTRACT WITH PATIENT We had a long discussion about chronic pain medications, the risks, benefits, potential side effects of medication.  We reviewed the pain contract in detail.  The patient understands the contract and is willing to abide by it.  He understands that he must take the medication as prescribed, he cannot obtain narcotic medication from any other source, he is not to request refills early, he is not to escalate the dose without a discussion.    He understands that narcotic prescriptions cannot be called in, mailed, or faxed. He will need to pick them up in person.  We reviewed that I prescribed using a 28-day schedule to ensure there is no confusion about weekend refills.  He understands that he will need to call a week in advance when he needs medication refills.    We  discussed the need for urine drug screens when they are requested.   We discussed the importance of securing his medication.  I reviewed that I will not refill medications early for loss of medication.  We discussed that there is a database that I reviewed to ensure that he is not obtaining narcotic medication from other providers and that all narcotics must be prescribed by a single provider.  We discussed that if he is not able to abide by the pain contract, I will no longer be able to prescribe narcotics for him.   Hld  (Hyperlipidemia)   Tolerating current statin.   Obstructive Sleep Apnea   NPSG 2006:  AHI 78/hr. Auto optimized to 13-14 cm.  Autoset 01/2011 to 13 cm.   Allergic Rhinitis   Generally controlled with Zyrtec.    Marland Kitchen Reviewed expectations re: course of current medical issues. . Discussed self-management of symptoms. . Outlined signs and symptoms indicating need for more acute intervention. . Patient verbalized understanding and all questions were answered. Marland Kitchen Health Maintenance issues including appropriate healthy diet, exercise, and smoking avoidance were discussed with patient. . See orders for this visit as documented in the electronic medical record. . Patient received an After Visit Summary.  CMA served as Neurosurgeon during this visit. History, Physical, and Plan performed by medical provider. The above documentation has been reviewed and is accurate and complete. [name], D.O.  Mark Rima, DO [place] [place] 09/03/2016  Future Appointments Date Time Provider Department [facility]  09/26/2016 8:15 AM Mark Rima, DO [facility] None

## 2016-09-03 ENCOUNTER — Encounter: Payer: Self-pay | Admitting: Family Medicine

## 2016-09-03 DIAGNOSIS — G894 Chronic pain syndrome: Secondary | ICD-10-CM | POA: Insufficient documentation

## 2016-09-26 ENCOUNTER — Encounter: Payer: Self-pay | Admitting: Family Medicine

## 2016-09-26 ENCOUNTER — Ambulatory Visit (INDEPENDENT_AMBULATORY_CARE_PROVIDER_SITE_OTHER): Payer: 59 | Admitting: Family Medicine

## 2016-09-26 VITALS — BP 132/84 | HR 84 | Temp 98.5°F | Ht 70.0 in | Wt 270.8 lb

## 2016-09-26 DIAGNOSIS — M47816 Spondylosis without myelopathy or radiculopathy, lumbar region: Secondary | ICD-10-CM | POA: Diagnosis not present

## 2016-09-26 DIAGNOSIS — E119 Type 2 diabetes mellitus without complications: Secondary | ICD-10-CM | POA: Diagnosis not present

## 2016-09-26 DIAGNOSIS — M545 Low back pain: Secondary | ICD-10-CM

## 2016-09-26 DIAGNOSIS — Z23 Encounter for immunization: Secondary | ICD-10-CM | POA: Diagnosis not present

## 2016-09-26 DIAGNOSIS — E1140 Type 2 diabetes mellitus with diabetic neuropathy, unspecified: Secondary | ICD-10-CM

## 2016-09-26 DIAGNOSIS — I10 Essential (primary) hypertension: Secondary | ICD-10-CM

## 2016-09-26 DIAGNOSIS — G8929 Other chronic pain: Secondary | ICD-10-CM | POA: Diagnosis not present

## 2016-09-26 DIAGNOSIS — M4696 Unspecified inflammatory spondylopathy, lumbar region: Secondary | ICD-10-CM

## 2016-09-26 DIAGNOSIS — E782 Mixed hyperlipidemia: Secondary | ICD-10-CM | POA: Diagnosis not present

## 2016-09-26 LAB — COMPREHENSIVE METABOLIC PANEL
ALT: 10 U/L (ref 0–53)
AST: 14 U/L (ref 0–37)
Albumin: 4.1 g/dL (ref 3.5–5.2)
Alkaline Phosphatase: 44 U/L (ref 39–117)
BUN: 7 mg/dL (ref 6–23)
CO2: 25 mEq/L (ref 19–32)
Calcium: 9.3 mg/dL (ref 8.4–10.5)
Chloride: 108 mEq/L (ref 96–112)
Creatinine, Ser: 0.94 mg/dL (ref 0.40–1.50)
GFR: 110.49 mL/min (ref >60.00)
Glucose, Bld: 97 mg/dL (ref 70–99)
Potassium: 4.1 mEq/L (ref 3.5–5.1)
Sodium: 141 mEq/L (ref 135–145)
Total Bilirubin: 0.4 mg/dL (ref 0.2–1.2)
Total Protein: 6.9 g/dL (ref 6.0–8.3)

## 2016-09-26 LAB — CBC WITH DIFFERENTIAL/PLATELET
Basophils Absolute: 0 10*3/uL (ref 0.0–0.1)
Basophils Relative: 0.7 % (ref 0.0–3.0)
Eosinophils Absolute: 0.1 10*3/uL (ref 0.0–0.7)
Eosinophils Relative: 2.4 % (ref 0.0–5.0)
HCT: 47.2 % (ref 39.0–52.0)
Hemoglobin: 14.8 g/dL (ref 13.0–17.0)
Lymphocytes Relative: 40.4 % (ref 12.0–46.0)
Lymphs Abs: 1.9 10*3/uL (ref 0.7–4.0)
MCHC: 31.4 g/dL (ref 30.0–36.0)
MCV: 82.3 fl (ref 78.0–100.0)
Monocytes Absolute: 0.3 10*3/uL (ref 0.1–1.0)
Monocytes Relative: 7.4 % (ref 3.0–12.0)
Neutro Abs: 2.3 10*3/uL (ref 1.4–7.7)
Neutrophils Relative %: 49.1 % (ref 43.0–77.0)
Platelets: 180 10*3/uL (ref 150.0–400.0)
RBC: 5.74 Mil/uL (ref 4.22–5.81)
RDW: 15.7 % — ABNORMAL HIGH (ref 11.5–15.5)
WBC: 4.7 10*3/uL (ref 4.0–10.5)

## 2016-09-26 LAB — VITAMIN B12: Vitamin B-12: 287 pg/mL (ref 211–911)

## 2016-09-26 MED ORDER — LOSARTAN POTASSIUM 25 MG PO TABS: 50.0000 mg | ORAL_TABLET | Freq: Every day | ORAL | 0 refills | Status: DC

## 2016-09-26 MED ORDER — ROSUVASTATIN CALCIUM 20 MG PO TABS: 20.0000 mg | ORAL_TABLET | Freq: Every day | ORAL | 2 refills | Status: DC

## 2016-09-26 MED ORDER — OXYCODONE-ACETAMINOPHEN 10-325 MG PO TABS: 1.0000 | ORAL_TABLET | Freq: Three times a day (TID) | ORAL | 0 refills | Status: DC | PRN

## 2016-09-26 MED ORDER — ASPIRIN EC 81 MG PO TBEC: 81.0000 mg | DELAYED_RELEASE_TABLET | Freq: Every day | ORAL | 0 refills | Status: DC

## 2016-09-26 NOTE — Progress Notes (Signed)
[name] is a 46 y.o. male is here for follow up.  History of Present Illness:   Insurance claims handler, CMA, acting as scribe for Dr. Earlene Plater.  HPI:  Patient comes in today for follow up of diabetes and back pain.  States that his back pain is unchanged.  He was only able to get a 1 week supply of Percocet from the pharmacy.  He states this was somewhat helpful when he was taking it.  He has not yet been referred to neurosurgery, but would like a referral today.  Would like to consider neurostimulator vs. Morphine pump for his pain.  He needs a refill on his Crestor today.  Would like the patient to start aspirin 81 mg daily.  The patient has never been on another blood pressure medication that provides protection of his kidneys.  Will start the patient on losartan 50 mg daily.  States he sometimes has some tingling in his hands and feet.  He has not had his B12 level checked recently.  Will check this today.  Health Maintenance Due  Topic Date Due  . HIV Screening  06/11/1984  . HEMOGLOBIN A1C  08/08/2014  . URINE MICROALBUMIN  02/08/2015  . FOOT EXAM  03/20/2015  . PNEUMOCOCCAL POLYSACCHARIDE VACCINE (2) 12/22/2015  . OPHTHALMOLOGY EXAM  02/27/2016  . INFLUENZA VACCINE  08/31/2016   Depression screen PHQ 2/9 08/30/2016  Decreased Interest 0  Down, Depressed, Hopeless 0  PHQ - 2 Score 0   PMHx, SurgHx, SocialHx, FamHx, Medications, and Allergies were reviewed in the Visit Navigator and updated as appropriate.   Patient Active Problem List   Diagnosis Date Noted  . Chronic pain syndrome 09/03/2016  . Trigger thumb, right thumb 05/29/2015  . Carpal tunnel syndrome on right 02/06/2015  . Cervical spondylosis without myelopathy 02/06/2015  . Trigger thumb of right hand 02/06/2015  . Obesity 03/15/2013  . BPH (benign prostatic hypertrophy) 11/15/2012  . Lumbar facet arthropathy (HCC) 08/02/2011  . Lumbar spondylosis 08/02/2011  . Type 2 diabetes mellitus without  complication, without long-term current use of insulin (HCC) 12/22/2010  . Chronic pain of both knees 12/08/2008  . HLD (hyperlipidemia) 08/23/2006  . GOUT 08/23/2006  . Obstructive sleep apnea 08/23/2006  . Allergic rhinitis 08/23/2006  . Chronic low back pain 08/23/2006  . Plantar fasciitis 08/23/2006  . Seizure disorder (HCC) 08/23/2006  . History of CVA (cerebrovascular accident) 08/23/2006   Social History  Substance Use Topics  . Smoking status: Former Smoker    Years: 4.00    Types: Cigarettes    Quit date: 12/22/1990  . Smokeless tobacco: Never Used     Comment: 1-2 packs a week  . Alcohol use No   Current Medications and Allergies:   Current Outpatient Prescriptions:  .  cetirizine (ZYRTEC) 10 MG tablet, Take 10 mg by mouth every other day., Disp: , Rfl:  .  clomiPHENE (CLOMID) 50 MG tablet, Take by mouth daily., Disp: , Rfl:  .  escitalopram (LEXAPRO) 20 MG tablet, Take 20 mg by mouth daily. Once daily, Disp: , Rfl: 0 .  gabapentin (NEURONTIN) 300 MG capsule, Take 1 capsule (300 mg total) by mouth 3 (three) times daily., Disp: 90 capsule, Rfl: 0 .  metFORMIN (GLUCOPHAGE) 500 MG tablet, TAKE 2 TABLETS BY MOUTH TWICE A DAY WITH A MEAL, Disp: 180 tablet, Rfl: 1 .  methocarbamol (ROBAXIN) 500 MG tablet, Take 500 mg by mouth every 6 (six) hours as needed for muscle spasms., Disp: ,  Rfl:  .  naproxen (NAPROSYN) 500 MG tablet, Take 1 tablet (500 mg total) by mouth 2 (two) times daily., Disp: 30 tablet, Rfl: 0 .  traZODone (DESYREL) 100 MG tablet, Take 100 mg by mouth at bedtime as needed for sleep. , Disp: , Rfl: 0 .  losartan (COZAAR) 25 MG tablet, Take 2 tablets (50 mg total) by mouth daily., Disp: 90 tablet, Rfl: 0 .  oxyCODONE-acetaminophen (PERCOCET) 10-325 MG tablet, Take 1 tablet by mouth every 8 (eight) hours as needed (CHRONIC PAIN)., Disp: 90 tablet, Rfl: 0 .  oxyCODONE-acetaminophen (PERCOCET) 10-325 MG tablet, Take 1 tablet by mouth every 8 (eight) hours as needed  (CHRONIC PAIN)., Disp: 90 tablet, Rfl: 0 .  rosuvastatin (CRESTOR) 20 MG tablet, Take 1 tablet (20 mg total) by mouth daily., Disp: 90 tablet, Rfl: 2  Allergies  Allergen Reactions  . Atorvastatin Swelling  . Cymbalta [Duloxetine Hcl]     Stomach upset, difficulty urinating, decreased libido.   Review of Systems   Pertinent items are noted in the HPI. Otherwise, ROS is negative.  Vitals:   Vitals:   09/26/16 0822  BP: 132/84  Pulse: 84  Temp: 98.5 F (36.9 C)  TempSrc: Oral  SpO2: 96%  Weight: 270 lb 12.8 oz (122.8 kg)  Height: 5\' 10"  (1.778 m)     Body mass index is 38.86 kg/m. Physical Exam:   Physical Exam  Constitutional: He is oriented to person, place, and time. He appears well-developed and well-nourished. No distress.  HENT:  Head: Normocephalic and atraumatic.  Right Ear: External ear normal.  Left Ear: External ear normal.  Nose: Nose normal.  Mouth/Throat: Oropharynx is clear and moist.  Eyes: Pupils are equal, round, and reactive to light. Conjunctivae and EOM are normal.  Neck: Normal range of motion. Neck supple.  Cardiovascular: Normal rate, regular rhythm, normal heart sounds and intact distal pulses.   Pulmonary/Chest: Effort normal and breath sounds normal.  Abdominal: Soft. Bowel sounds are normal.  Musculoskeletal: Normal range of motion.  Neurological: He is alert and oriented to person, place, and time.  Skin: Skin is warm and dry.  Psychiatric: He has a normal mood and affect. His behavior is normal. Judgment and thought content normal.  Nursing note and vitals reviewed.   Assessment and Plan:   Darragh was seen today for follow-up and diabetes.  Diagnoses and all orders for this visit:  Type 2 diabetes mellitus without complication, without long-term current use of insulin (HCC) Comments: Will add ARB and ASA to regimen today. Orders: -     CBC with Differential/Platelet -     Comprehensive metabolic panel  Lumbar spondylosis -      oxyCODONE-acetaminophen (PERCOCET) 10-325 MG tablet; Take 1 tablet by mouth every 8 (eight) hours as needed (CHRONIC PAIN). -     oxyCODONE-acetaminophen (PERCOCET) 10-325 MG tablet; Take 1 tablet by mouth every 8 (eight) hours as needed (CHRONIC PAIN). -     oxyCODONE-acetaminophen (PERCOCET) 10-325 MG tablet; Take 1 tablet by mouth every 8 (eight) hours as needed (CHRONIC PAIN). -     Ambulatory referral to Neurosurgery  Lumbar facet arthropathy (HCC) Comments: To Neurosurgery for PAIN CONTROL options. Continue current managment in the meantime. Orders: -     Discontinue: oxyCODONE-acetaminophen (PERCOCET) 10-325 MG tablet; Take 1 tablet by mouth every 8 (eight) hours as needed (CHRONIC PAIN). -     oxyCODONE-acetaminophen (PERCOCET) 10-325 MG tablet; Take 1 tablet by mouth every 8 (eight) hours as needed (CHRONIC PAIN). -  oxyCODONE-acetaminophen (PERCOCET) 10-325 MG tablet; Take 1 tablet by mouth every 8 (eight) hours as needed (CHRONIC PAIN). -     Ambulatory referral to Neurosurgery  Mixed hyperlipidemia -     rosuvastatin (CRESTOR) 20 MG tablet; Take 1 tablet (20 mg total) by mouth daily. -     CBC with Differential/Platelet -     Comprehensive metabolic panel  Type 2 diabetes mellitus with diabetic neuropathy, without long-term current use of insulin (HCC) Comments: Increased neuropathic pain recently, but intetmittent. Will check B12. Orders: -     B12  Essential hypertension Comments: Patient will check B12 at home since we are adding Losartan. Orders: -     losartan (COZAAR) 25 MG tablet; Take 2 tablets (50 mg total) by mouth daily.  Chronic low back pain, unspecified back pain laterality, with sciatica presence unspecified -     oxyCODONE-acetaminophen (PERCOCET) 10-325 MG tablet; Take 1 tablet by mouth every 8 (eight) hours as needed (CHRONIC PAIN). -     oxyCODONE-acetaminophen (PERCOCET) 10-325 MG tablet; Take 1 tablet by mouth every 8 (eight) hours as needed  (CHRONIC PAIN). -     oxyCODONE-acetaminophen (PERCOCET) 10-325 MG tablet; Take 1 tablet by mouth every 8 (eight) hours as needed (CHRONIC PAIN). -     Ambulatory referral to Neurosurgery  Need for immunization against influenza -     Flu Vaccine QUAD 36+ mos IM  . Reviewed expectations re: course of current medical issues. . Discussed self-management of symptoms. . Outlined signs and symptoms indicating need for more acute intervention. . Patient verbalized understanding and all questions were answered. Marland Kitchen Health Maintenance issues including appropriate healthy diet, exercise, and smoking avoidance were discussed with patient. . See orders for this visit as documented in the electronic medical record. . Patient received an After Visit Summary.  CMA served as Neurosurgeon during this visit. History, Physical, and Plan performed by medical provider. The above documentation has been reviewed and is accurate and complete. Helane Rima, D.O.  Helane Rima, DO [place], [facility] 09/26/2016

## 2016-10-20 DIAGNOSIS — M545 Low back pain: Secondary | ICD-10-CM | POA: Diagnosis not present

## 2016-10-25 ENCOUNTER — Other Ambulatory Visit: Payer: Self-pay | Admitting: Neurosurgery

## 2016-10-25 DIAGNOSIS — M545 Low back pain: Secondary | ICD-10-CM

## 2016-11-02 ENCOUNTER — Encounter: Payer: Self-pay | Admitting: Family Medicine

## 2016-11-08 ENCOUNTER — Telehealth: Payer: Self-pay | Admitting: Family Medicine

## 2016-11-08 NOTE — Telephone Encounter (Signed)
Form has been filled out.

## 2016-11-08 NOTE — Telephone Encounter (Signed)
Paperwork: Disability parking Art therapist received by [name] requesting form]: [name]/[employer]   Individual made aware of 3-5 business day turn around (Y/N):Y   Office form(s) completed and placed with paperwork (Y/N):Y   Form location: Dr. Philis Pique folder upfront

## 2016-11-09 NOTE — Telephone Encounter (Signed)
Form placed up front and My Chart message sent to patient that he may pick up form (this is how patient initiated contact regarding form).

## 2016-11-11 ENCOUNTER — Other Ambulatory Visit: Payer: 59

## 2016-11-14 ENCOUNTER — Other Ambulatory Visit: Payer: 59

## 2016-11-15 ENCOUNTER — Ambulatory Visit
Admission: RE | Admit: 2016-11-15 | Discharge: 2016-11-15 | Disposition: A | Discharge status: Home / Self Care | Payer: 59 | Source: Ambulatory Visit | Attending: Neurosurgery | Admitting: Neurosurgery

## 2016-11-15 DIAGNOSIS — M48061 Spinal stenosis, lumbar region without neurogenic claudication: Secondary | ICD-10-CM | POA: Diagnosis not present

## 2016-11-15 DIAGNOSIS — M545 Low back pain: Secondary | ICD-10-CM

## 2016-11-16 DIAGNOSIS — R030 Elevated blood-pressure reading, without diagnosis of hypertension: Secondary | ICD-10-CM | POA: Diagnosis not present

## 2016-11-16 DIAGNOSIS — M4310 Spondylolisthesis, site unspecified: Secondary | ICD-10-CM | POA: Diagnosis not present

## 2016-11-19 ENCOUNTER — Other Ambulatory Visit: Payer: Self-pay | Admitting: Family Medicine

## 2016-11-19 DIAGNOSIS — I10 Essential (primary) hypertension: Secondary | ICD-10-CM

## 2016-11-25 DIAGNOSIS — M79605 Pain in left leg: Secondary | ICD-10-CM | POA: Diagnosis not present

## 2016-11-25 DIAGNOSIS — M545 Low back pain: Secondary | ICD-10-CM | POA: Diagnosis not present

## 2016-11-25 DIAGNOSIS — M79604 Pain in right leg: Secondary | ICD-10-CM | POA: Diagnosis not present

## 2016-11-29 DIAGNOSIS — G4733 Obstructive sleep apnea (adult) (pediatric): Secondary | ICD-10-CM | POA: Diagnosis not present

## 2016-12-01 ENCOUNTER — Other Ambulatory Visit: Payer: Self-pay

## 2016-12-01 ENCOUNTER — Encounter: Payer: Self-pay | Admitting: Family Medicine

## 2016-12-01 MED ORDER — METFORMIN HCL 500 MG PO TABS: ORAL_TABLET | ORAL | 0 refills | Status: DC

## 2016-12-05 DIAGNOSIS — M79605 Pain in left leg: Secondary | ICD-10-CM | POA: Diagnosis not present

## 2016-12-05 DIAGNOSIS — M545 Low back pain: Secondary | ICD-10-CM | POA: Diagnosis not present

## 2016-12-05 DIAGNOSIS — M79604 Pain in right leg: Secondary | ICD-10-CM | POA: Diagnosis not present

## 2016-12-07 DIAGNOSIS — M5416 Radiculopathy, lumbar region: Secondary | ICD-10-CM | POA: Diagnosis not present

## 2016-12-07 DIAGNOSIS — M5126 Other intervertebral disc displacement, lumbar region: Secondary | ICD-10-CM | POA: Diagnosis not present

## 2016-12-12 DIAGNOSIS — M545 Low back pain: Secondary | ICD-10-CM | POA: Diagnosis not present

## 2016-12-12 DIAGNOSIS — M79604 Pain in right leg: Secondary | ICD-10-CM | POA: Diagnosis not present

## 2016-12-12 DIAGNOSIS — M79605 Pain in left leg: Secondary | ICD-10-CM | POA: Diagnosis not present

## 2016-12-15 ENCOUNTER — Encounter: Payer: Self-pay | Admitting: Family Medicine

## 2016-12-16 DIAGNOSIS — M549 Dorsalgia, unspecified: Secondary | ICD-10-CM | POA: Diagnosis not present

## 2016-12-16 DIAGNOSIS — G8929 Other chronic pain: Secondary | ICD-10-CM | POA: Diagnosis not present

## 2016-12-16 NOTE — Telephone Encounter (Signed)
Patient called back to sched appt for disability ppw, but there is nothing available before he goes out of town on 11/27 around 1pm. Please advise.

## 2016-12-19 ENCOUNTER — Other Ambulatory Visit: Payer: Self-pay | Admitting: Surgical

## 2016-12-19 DIAGNOSIS — I10 Essential (primary) hypertension: Secondary | ICD-10-CM

## 2016-12-19 MED ORDER — LOSARTAN POTASSIUM 25 MG PO TABS: 50.0000 mg | ORAL_TABLET | Freq: Every day | ORAL | 1 refills | Status: DC

## 2016-12-20 ENCOUNTER — Other Ambulatory Visit: Payer: Self-pay | Admitting: *Deleted

## 2016-12-20 DIAGNOSIS — I10 Essential (primary) hypertension: Secondary | ICD-10-CM

## 2016-12-20 MED ORDER — LOSARTAN POTASSIUM 25 MG PO TABS: 50.0000 mg | ORAL_TABLET | Freq: Every day | ORAL | 0 refills | Status: DC

## 2016-12-26 ENCOUNTER — Other Ambulatory Visit: Payer: Self-pay | Admitting: *Deleted

## 2016-12-26 ENCOUNTER — Encounter: Payer: Self-pay | Admitting: Family Medicine

## 2016-12-26 ENCOUNTER — Ambulatory Visit: Payer: 59 | Admitting: Family Medicine

## 2016-12-26 VITALS — BP 126/62 | HR 100 | Temp 98.6°F | Wt 280.8 lb

## 2016-12-26 DIAGNOSIS — G894 Chronic pain syndrome: Secondary | ICD-10-CM | POA: Diagnosis not present

## 2016-12-26 DIAGNOSIS — Z8673 Personal history of transient ischemic attack (TIA), and cerebral infarction without residual deficits: Secondary | ICD-10-CM

## 2016-12-26 DIAGNOSIS — Z9989 Dependence on other enabling machines and devices: Secondary | ICD-10-CM

## 2016-12-26 DIAGNOSIS — G4700 Insomnia, unspecified: Secondary | ICD-10-CM

## 2016-12-26 DIAGNOSIS — G4733 Obstructive sleep apnea (adult) (pediatric): Secondary | ICD-10-CM

## 2016-12-26 DIAGNOSIS — F419 Anxiety disorder, unspecified: Secondary | ICD-10-CM

## 2016-12-26 DIAGNOSIS — F341 Dysthymic disorder: Secondary | ICD-10-CM | POA: Diagnosis not present

## 2016-12-26 MED ORDER — METFORMIN HCL 500 MG PO TABS: ORAL_TABLET | ORAL | 0 refills | Status: DC

## 2016-12-26 NOTE — Progress Notes (Signed)
[name] [name] is a 46 y.o. male is here for follow up.  History of Present Illness:   HPI: PAPERWORK COMPLETED TOGETHER TODAY FOR [employer] DISABILITY. SEE SCANNED.  Health Maintenance Due  Topic Date Due  . HIV Screening  06/11/1984  . PNEUMOCOCCAL POLYSACCHARIDE VACCINE (2) 12/22/2015   Depression screen PHQ 2/9 08/30/2016  Decreased Interest 0  Down, Depressed, Hopeless 0  PHQ - 2 Score 0   PMHx, SurgHx, SocialHx, FamHx, Medications, and Allergies were reviewed in the Visit Navigator and updated as appropriate.   Patient Active Problem List   Diagnosis Date Noted  . Chronic pain syndrome 09/03/2016  . Trigger thumb, right thumb 05/29/2015  . Carpal tunnel syndrome on right 02/06/2015  . Cervical spondylosis without myelopathy 02/06/2015  . Trigger thumb of right hand 02/06/2015  . Obesity 03/15/2013  . BPH (benign prostatic hypertrophy) 11/15/2012  . Lumbar facet arthropathy 08/02/2011  . Lumbar spondylosis 08/02/2011  . Type 2 diabetes mellitus without complication, without long-term current use of insulin (HCC) 12/22/2010  . Chronic pain of both knees 12/08/2008  . HLD (hyperlipidemia) 08/23/2006  . GOUT 08/23/2006  . Obstructive sleep apnea 08/23/2006  . Allergic rhinitis 08/23/2006  . Chronic low back pain 08/23/2006  . Plantar fasciitis 08/23/2006  . Seizure disorder (HCC) 08/23/2006  . History of CVA (cerebrovascular accident) 08/23/2006   Social History   Tobacco Use  . Smoking status: Former Smoker    Years: 4.00    Types: Cigarettes    Last attempt to quit: 12/22/1990    Years since quitting: 26.0  . Smokeless tobacco: Never Used  . Tobacco comment: 1-2 packs a week  Substance Use Topics  . Alcohol use: No    Alcohol/week: 0.0 oz  . Drug use: No   Current Medications and Allergies:   Current Outpatient Medications:  .  aspirin EC 81 MG tablet, Take 1 tablet (81 mg total) by mouth daily., Disp: 30 tablet, Rfl: 0 .  cetirizine (ZYRTEC) 10 MG  tablet, Take 10 mg by mouth every other day., Disp: , Rfl:  .  clomiPHENE (CLOMID) 50 MG tablet, Take by mouth daily., Disp: , Rfl:  .  escitalopram (LEXAPRO) 20 MG tablet, Take 20 mg by mouth daily. Once daily, Disp: , Rfl: 0 .  gabapentin (NEURONTIN) 300 MG capsule, Take 1 capsule (300 mg total) by mouth 3 (three) times daily., Disp: 90 capsule, Rfl: 0 .  losartan (COZAAR) 25 MG tablet, Take 2 tablets (50 mg total) by mouth daily., Disp: 180 tablet, Rfl: 0 .  lurasidone (LATUDA) 40 MG TABS tablet, Take 40 mg by mouth daily with breakfast., Disp: , Rfl:  .  metFORMIN (GLUCOPHAGE) 500 MG tablet, TAKE 2 TABLETS BY MOUTH TWICE A DAY WITH A MEAL, Disp: 120 tablet, Rfl: 0 .  methocarbamol (ROBAXIN) 500 MG tablet, Take 500 mg by mouth every 6 (six) hours as needed for muscle spasms., Disp: , Rfl:  .  naproxen (NAPROSYN) 500 MG tablet, Take 1 tablet (500 mg total) by mouth 2 (two) times daily., Disp: 30 tablet, Rfl: 0 .  oxyCODONE-acetaminophen (PERCOCET) 10-325 MG tablet, Take 1 tablet by mouth every 8 (eight) hours as needed (CHRONIC PAIN)., Disp: 90 tablet, Rfl: 0 .  oxyCODONE-acetaminophen (PERCOCET) 10-325 MG tablet, Take 1 tablet by mouth every 8 (eight) hours as needed (CHRONIC PAIN)., Disp: 90 tablet, Rfl: 0 .  rosuvastatin (CRESTOR) 20 MG tablet, Take 1 tablet (20 mg total) by mouth daily., Disp: 90 tablet, Rfl: 2 .  traZODone (DESYREL) 100 MG tablet, Take 100 mg by mouth at bedtime as needed for sleep. , Disp: , Rfl: 0   Allergies  Allergen Reactions  . Atorvastatin Swelling  . Cymbalta [Duloxetine Hcl]     Stomach upset, difficulty urinating, decreased libido.   Review of Systems   Pertinent items are noted in the HPI. Otherwise, ROS is negative.  Vitals:   Vitals:   12/26/16 1447  BP: 126/62  Pulse: 100  Temp: 98.6 F (37 C)  TempSrc: Oral  SpO2: 95%  Weight: 280 lb 12.8 oz (127.4 kg)     Body mass index is 40.29 kg/m.   Physical Exam:   Physical Exam  Constitutional:  He is oriented to person, place, and time. He appears well-developed and well-nourished. No distress.  HENT:  Head: Normocephalic and atraumatic.  Right Ear: External ear normal.  Left Ear: External ear normal.  Nose: Nose normal.  Mouth/Throat: Oropharynx is clear and moist.  Eyes: Conjunctivae and EOM are normal. Pupils are equal, round, and reactive to light.  Neck: Normal range of motion. Neck supple.  Cardiovascular: Normal rate, regular rhythm, normal heart sounds and intact distal pulses.  Pulmonary/Chest: Effort normal and breath sounds normal.  Abdominal: Soft. Bowel sounds are normal.  Musculoskeletal: Normal range of motion.  Neurological: He is alert and oriented to person, place, and time.  Skin: Skin is warm and dry.  Psychiatric: He has a normal mood and affect. His behavior is normal. Judgment and thought content normal.  Nursing note and vitals reviewed.   Assessment and Plan:   [name] was seen today for follow-up.  Diagnoses and all orders for this visit:  Dysthymia  History of CVA (cerebrovascular accident)  Chronic pain syndrome  Anxiety  Insomnia, unspecified type  OSA on CPAP   Records requested if needed. Time spent with the patient: 25 minutes, of which >50% was spent in obtaining information about his symptoms, reviewing his previous labs, evaluations, and treatments, counseling him about his condition (please see the discussed topics above), and developing a plan to further investigate it; he had a number of questions which I addressed.   . Reviewed expectations re: course of current medical issues. . Discussed self-management of symptoms. . Outlined signs and symptoms indicating need for more acute intervention. . Patient verbalized understanding and all questions were answered. Marland Kitchen. Health Maintenance issues including appropriate healthy diet, exercise, and smoking avoidance were discussed with patient. . See orders for this visit as documented in  the electronic medical record. . Patient received an After Visit Summary.  Mark RimaErica Thedford Bunton, DO [place], [facility] 12/26/2016  No future appointments.

## 2016-12-27 ENCOUNTER — Ambulatory Visit: Payer: Self-pay | Admitting: Family Medicine

## 2016-12-27 DIAGNOSIS — G4733 Obstructive sleep apnea (adult) (pediatric): Secondary | ICD-10-CM | POA: Diagnosis not present

## 2017-01-01 ENCOUNTER — Encounter: Payer: Self-pay | Admitting: Family Medicine

## 2017-01-01 DIAGNOSIS — F419 Anxiety disorder, unspecified: Secondary | ICD-10-CM | POA: Insufficient documentation

## 2017-01-01 DIAGNOSIS — F339 Major depressive disorder, recurrent, unspecified: Secondary | ICD-10-CM | POA: Insufficient documentation

## 2017-01-18 ENCOUNTER — Encounter: Payer: Self-pay | Admitting: Family Medicine

## 2017-02-21 DIAGNOSIS — Z0100 Encounter for examination of eyes and vision without abnormal findings: Secondary | ICD-10-CM | POA: Diagnosis not present

## 2017-02-21 LAB — HM DIABETES EYE EXAM

## 2017-02-28 DIAGNOSIS — G4733 Obstructive sleep apnea (adult) (pediatric): Secondary | ICD-10-CM | POA: Diagnosis not present

## 2017-03-02 ENCOUNTER — Encounter: Payer: Self-pay | Admitting: Surgical

## 2017-03-13 DIAGNOSIS — E119 Type 2 diabetes mellitus without complications: Secondary | ICD-10-CM | POA: Diagnosis not present

## 2017-03-13 DIAGNOSIS — M2578 Osteophyte, vertebrae: Secondary | ICD-10-CM | POA: Diagnosis not present

## 2017-03-13 DIAGNOSIS — M47896 Other spondylosis, lumbar region: Secondary | ICD-10-CM | POA: Diagnosis not present

## 2017-03-13 DIAGNOSIS — E785 Hyperlipidemia, unspecified: Secondary | ICD-10-CM | POA: Diagnosis not present

## 2017-03-17 DIAGNOSIS — R293 Abnormal posture: Secondary | ICD-10-CM | POA: Diagnosis not present

## 2017-03-17 DIAGNOSIS — M2560 Stiffness of unspecified joint, not elsewhere classified: Secondary | ICD-10-CM | POA: Diagnosis not present

## 2017-03-17 DIAGNOSIS — M545 Low back pain: Secondary | ICD-10-CM | POA: Diagnosis not present

## 2017-03-28 DIAGNOSIS — G5603 Carpal tunnel syndrome, bilateral upper limbs: Secondary | ICD-10-CM | POA: Diagnosis not present

## 2017-03-29 DIAGNOSIS — Z4689 Encounter for fitting and adjustment of other specified devices: Secondary | ICD-10-CM | POA: Diagnosis not present

## 2017-03-29 DIAGNOSIS — M545 Low back pain: Secondary | ICD-10-CM | POA: Diagnosis not present

## 2017-03-31 DIAGNOSIS — M7711 Lateral epicondylitis, right elbow: Secondary | ICD-10-CM | POA: Diagnosis not present

## 2017-03-31 DIAGNOSIS — M7712 Lateral epicondylitis, left elbow: Secondary | ICD-10-CM | POA: Diagnosis not present

## 2017-04-04 DIAGNOSIS — G5603 Carpal tunnel syndrome, bilateral upper limbs: Secondary | ICD-10-CM | POA: Diagnosis not present

## 2017-04-18 DIAGNOSIS — I10 Essential (primary) hypertension: Secondary | ICD-10-CM | POA: Diagnosis not present

## 2017-04-18 DIAGNOSIS — G5603 Carpal tunnel syndrome, bilateral upper limbs: Secondary | ICD-10-CM | POA: Diagnosis not present

## 2017-04-18 DIAGNOSIS — E119 Type 2 diabetes mellitus without complications: Secondary | ICD-10-CM | POA: Diagnosis not present

## 2017-05-01 DIAGNOSIS — R51 Headache: Secondary | ICD-10-CM | POA: Diagnosis not present

## 2017-05-12 DIAGNOSIS — G5603 Carpal tunnel syndrome, bilateral upper limbs: Secondary | ICD-10-CM | POA: Diagnosis not present

## 2017-05-13 DIAGNOSIS — R51 Headache: Secondary | ICD-10-CM | POA: Diagnosis not present

## 2017-05-15 DIAGNOSIS — G5603 Carpal tunnel syndrome, bilateral upper limbs: Secondary | ICD-10-CM | POA: Diagnosis not present

## 2017-05-22 DIAGNOSIS — Z4802 Encounter for removal of sutures: Secondary | ICD-10-CM | POA: Diagnosis not present

## 2017-05-23 ENCOUNTER — Encounter: Payer: Self-pay | Admitting: Family Medicine

## 2017-05-24 DIAGNOSIS — I69852 Hemiplegia and hemiparesis following other cerebrovascular disease affecting left dominant side: Secondary | ICD-10-CM | POA: Diagnosis not present

## 2017-05-24 DIAGNOSIS — G43109 Migraine with aura, not intractable, without status migrainosus: Secondary | ICD-10-CM | POA: Diagnosis not present

## 2017-05-24 DIAGNOSIS — Z0001 Encounter for general adult medical examination with abnormal findings: Secondary | ICD-10-CM | POA: Diagnosis not present

## 2017-05-29 DIAGNOSIS — G4733 Obstructive sleep apnea (adult) (pediatric): Secondary | ICD-10-CM | POA: Diagnosis not present

## 2017-05-30 ENCOUNTER — Ambulatory Visit: Payer: 59 | Admitting: Family Medicine

## 2017-05-30 VITALS — BP 108/74 | HR 97 | Temp 98.1°F | Ht 70.0 in | Wt 275.0 lb

## 2017-05-30 DIAGNOSIS — M25511 Pain in right shoulder: Secondary | ICD-10-CM

## 2017-05-30 DIAGNOSIS — M722 Plantar fascial fibromatosis: Secondary | ICD-10-CM

## 2017-05-30 DIAGNOSIS — M5441 Lumbago with sciatica, right side: Secondary | ICD-10-CM | POA: Diagnosis not present

## 2017-05-30 DIAGNOSIS — M47812 Spondylosis without myelopathy or radiculopathy, cervical region: Secondary | ICD-10-CM

## 2017-05-30 DIAGNOSIS — M25562 Pain in left knee: Secondary | ICD-10-CM

## 2017-05-30 DIAGNOSIS — M47816 Spondylosis without myelopathy or radiculopathy, lumbar region: Secondary | ICD-10-CM | POA: Diagnosis not present

## 2017-05-30 DIAGNOSIS — F4312 Post-traumatic stress disorder, chronic: Secondary | ICD-10-CM | POA: Diagnosis not present

## 2017-05-30 DIAGNOSIS — G8929 Other chronic pain: Secondary | ICD-10-CM

## 2017-05-30 DIAGNOSIS — M25561 Pain in right knee: Secondary | ICD-10-CM

## 2017-05-30 DIAGNOSIS — M5442 Lumbago with sciatica, left side: Secondary | ICD-10-CM | POA: Diagnosis not present

## 2017-05-30 NOTE — Progress Notes (Signed)
[name] is a 47 y.o. male is here for follow up.  History of Present Illness:   HPI: See Assessment and Plan section for Problem Based Charting of issues discussed today.   Health Maintenance Due  Topic Date Due  . HIV Screening  06/11/1984  . PNEUMOCOCCAL POLYSACCHARIDE VACCINE (2) 12/22/2015  . HEMOGLOBIN A1C  01/17/2017   Depression screen PHQ 2/9 05/30/2017 08/30/2016  Decreased Interest 3 0  Down, Depressed, Hopeless 3 0  PHQ - 2 Score 6 0  Altered sleeping 3 -  Tired, decreased energy 3 -  Change in appetite 2 -  Feeling bad or failure about yourself  1 -  Trouble concentrating 3 -  Moving slowly or fidgety/restless 2 -  Suicidal thoughts 1 -  PHQ-9 Score 21 -   PMHx, SurgHx, SocialHx, FamHx, Medications, and Allergies were reviewed in the Visit Navigator and updated as appropriate.   Patient Active Problem List   Diagnosis Date Noted  . Depression, recurrent (HCC) 01/01/2017  . Anxiety disorder 01/01/2017  . Chronic pain syndrome 09/03/2016  . Insomnia 07/15/2015  . Trigger thumb, right thumb 05/29/2015  . Carpal tunnel syndrome on right 02/06/2015  . Cervical spondylosis without myelopathy 02/06/2015  . Trigger thumb of right hand 02/06/2015  . Obesity 03/15/2013  . BPH (benign prostatic hypertrophy) 11/15/2012  . Lumbar facet arthropathy 08/02/2011  . Lumbar spondylosis 08/02/2011  . Type 2 diabetes mellitus without complication, without long-term current use of insulin (HCC) 12/22/2010  . Chronic pain of both knees 12/08/2008  . HLD (hyperlipidemia) 08/23/2006  . GOUT 08/23/2006  . Obstructive sleep apnea 08/23/2006  . Allergic rhinitis 08/23/2006  . Chronic low back pain 08/23/2006  . Plantar fasciitis 08/23/2006  . Seizure disorder (HCC) 08/23/2006  . History of CVA (cerebrovascular accident) 08/23/2006   Social History   Tobacco Use  . Smoking status: Former Smoker    Years: 4.00    Types: Cigarettes    Last attempt to quit:  12/22/1990    Years since quitting: 26.4  . Smokeless tobacco: Never Used  . Tobacco comment: 1-2 packs a week  Substance Use Topics  . Alcohol use: No    Alcohol/week: 0.0 oz  . Drug use: No   Current Medications and Allergies:   .  aspirin EC 81 MG tablet, Take 1 tablet (81 mg total) by mouth daily., Disp: 30 tablet, Rfl: 0 .  cetirizine (ZYRTEC) 10 MG tablet, Take 10 mg by mouth every other day., Disp: , Rfl:  .  clomiPHENE (CLOMID) 50 MG tablet, Take by mouth daily., Disp: , Rfl:  .  escitalopram (LEXAPRO) 20 MG tablet, Take 20 mg by mouth daily. Once daily, Disp: , Rfl: 0 .  gabapentin (NEURONTIN) 300 MG capsule, Take 1 capsule (300 mg total) by mouth 3 (three) times daily., Disp: 90 capsule, Rfl: 0 .  losartan (COZAAR) 25 MG tablet, Take 2 tablets (50 mg total) by mouth daily., Disp: 180 tablet, Rfl: 0 .  lurasidone (LATUDA) 40 MG TABS tablet, Take 40 mg by mouth daily with breakfast., Disp: , Rfl:  .  metFORMIN (GLUCOPHAGE) 500 MG tablet, TAKE 2 TABLETS BY MOUTH TWICE A DAY WITH A MEAL, Disp: 360 tablet, Rfl: 0 .  methocarbamol (ROBAXIN) 500 MG tablet, Take 500 mg by mouth every 6 (six) hours as needed for muscle spasms., Disp: , Rfl:  .  naproxen (NAPROSYN) 500 MG tablet, Take 1 tablet (500 mg total) by mouth 2 (two) times daily., Disp:  30 tablet, Rfl: 0 .  oxyCODONE-acetaminophen (PERCOCET) 10-325 MG tablet, Take 1 tablet by mouth every 8 (eight) hours as needed (CHRONIC PAIN)., Disp: 90 tablet, Rfl: 0 .  oxyCODONE-acetaminophen (PERCOCET) 10-325 MG tablet, Take 1 tablet by mouth every 8 (eight) hours as needed (CHRONIC PAIN)., Disp: 90 tablet, Rfl: 0 .  rosuvastatin (CRESTOR) 20 MG tablet, Take 1 tablet (20 mg total) by mouth daily., Disp: 90 tablet, Rfl: 2 .  traZODone (DESYREL) 100 MG tablet, Take 100 mg by mouth at bedtime as needed for sleep. , Disp: , Rfl: 0   Allergies  Allergen Reactions  . Atorvastatin Swelling  . Cymbalta [Duloxetine Hcl]     Stomach upset,  difficulty urinating, decreased libido.   Review of Systems   Pertinent items are noted in the HPI. Otherwise, ROS is negative.  Vitals:   Vitals:   05/30/17 1305  BP: 108/74  Pulse: 97  Temp: 98.1 F (36.7 C)  SpO2: 98%  Weight: 275 lb (124.7 kg)  Height:  (1.778 m)     Body mass index is 39.46 kg/m.   Physical Exam:   Physical Exam  Constitutional: He is oriented to person, place, and time. He appears well-developed and well-nourished. No distress.  HENT:  Head: Normocephalic and atraumatic.  Right Ear: External ear normal.  Left Ear: External ear normal.  Nose: Nose normal.  Mouth/Throat: Oropharynx is clear and moist.  Eyes: Pupils are equal, round, and reactive to light. Conjunctivae and EOM are normal.  Neck: Normal range of motion. Neck supple.  Cardiovascular: Normal rate, regular rhythm, normal heart sounds and intact distal pulses.  Pulmonary/Chest: Effort normal and breath sounds normal.  Abdominal: Soft. Bowel sounds are normal.  Neurological: He is alert and oriented to person, place, and time.  Skin: Skin is warm and dry.  Psychiatric: He has a normal mood and affect. His behavior is normal. Judgment and thought content normal.  Nursing note and vitals reviewed.   Assessment and Plan:   [name] was seen today for follow-up.  Diagnoses and all orders for this visit:  Cervical spondylosis without myelopathy  Lumbar facet arthropathy  Lumbar spondylosis  Plantar fasciitis  Chronic pain of both knees   Paperwork completed for patient regarding [employer] benefits.  Please see scanned for that.  Please see letter.  Records requested if needed. Time spent with the patient: 30 minutes, of which >50% was spent in obtaining information about his symptoms, reviewing his previous labs, evaluations, and treatments, counseling him about his condition (please see the discussed topics above), and developing a plan to further investigate it; he had a number of  questions which I addressed.   . Reviewed expectations re: course of current medical issues. . Discussed self-management of symptoms. . Outlined signs and symptoms indicating need for more acute intervention. . Patient verbalized understanding and all questions were answered. Marland Kitchen Health Maintenance issues including appropriate healthy diet, exercise, and smoking avoidance were discussed with patient. . See orders for this visit as documented in the electronic medical record. . Patient received an After Visit Summary.  Helane Rima, DO [place], [facility] 05/30/2017  Future Appointments  Date Time Provider Department Center  06/14/2017 11:45 AM Oretha Milch, MD [facility] None

## 2017-06-03 ENCOUNTER — Encounter: Payer: Self-pay | Admitting: Family Medicine

## 2017-06-03 DIAGNOSIS — G8929 Other chronic pain: Secondary | ICD-10-CM | POA: Insufficient documentation

## 2017-06-03 DIAGNOSIS — F4312 Post-traumatic stress disorder, chronic: Secondary | ICD-10-CM | POA: Insufficient documentation

## 2017-06-03 DIAGNOSIS — Z9182 Personal history of military deployment: Secondary | ICD-10-CM | POA: Insufficient documentation

## 2017-06-03 DIAGNOSIS — M25511 Pain in right shoulder: Secondary | ICD-10-CM

## 2017-06-14 ENCOUNTER — Encounter: Payer: Self-pay | Admitting: Pulmonary Disease

## 2017-06-14 ENCOUNTER — Ambulatory Visit (INDEPENDENT_AMBULATORY_CARE_PROVIDER_SITE_OTHER): Payer: 59 | Admitting: Pulmonary Disease

## 2017-06-14 DIAGNOSIS — G4733 Obstructive sleep apnea (adult) (pediatric): Secondary | ICD-10-CM | POA: Diagnosis not present

## 2017-06-14 NOTE — Addendum Note (Signed)
Addended by: Maurene Capes on: 06/14/2017 12:18 PM   Modules accepted: Orders

## 2017-06-14 NOTE — Assessment & Plan Note (Signed)
Weight loss encouraged 

## 2017-06-14 NOTE — Assessment & Plan Note (Signed)
CPAP seems to be working well, auto settings are adequate.  Try new kind of full facemask, ResMed air fit F 30  Weight loss encouraged, compliance with goal of at least 4-6 hrs every night is the expectation. Advised against medications with sedative side effects Cautioned against driving when sleepy - understanding that sleepiness will vary on a day to day basis

## 2017-06-14 NOTE — Progress Notes (Signed)
   Subjective:    Patient ID: [name], male    DOB: May 07, 1970, 47 y.o.   MRN: [id number]  HPI 47 year old obese, diabetic for follow-up of OSA. This was diagnosed in 2006  New CPAP since 2016 He has settled with a full facemask.  No problems with mask or pressure, no dryness of mouth or nasal passages, feels rested and denies sleep pressure in the daytime.  I reviewed results on his phone app, usage seems to be adequate with good compliance, pressure seems to be adequate no residual events with AHI less than 3/hour and leak is minimal  He denies history of hypertension, is on losartan for kidneys and verapamil for migraine headaches   Significant tests/ events  NPSG 2006:  AHI 78/hr Auto optimized to 13-14cm.  autoset 01/2011:  13cm  Review of Systems Patient denies significant dyspnea,cough, hemoptysis,  chest pain, palpitations, pedal edema, orthopnea, paroxysmal nocturnal dyspnea, lightheadedness, nausea, vomiting, abdominal or  leg pains      Objective:   Physical Exam  Gen. Pleasant, obese, in no distress ENT - class 2 airway, no post nasal drip Neck: No JVD, no thyromegaly, no carotid bruits Lungs: no use of accessory muscles, no dullness to percussion, decreased without rales or rhonchi  Cardiovascular: Rhythm regular, heart sounds  normal, no murmurs or gallops, no peripheral edema Musculoskeletal: No deformities, no cyanosis or clubbing , no tremors       Assessment & Plan:

## 2017-06-14 NOTE — Patient Instructions (Signed)
CPAP seems to be working well, settings are adequate.  Try new kind of full facemask, ResMed air fit F 30

## 2017-06-29 DIAGNOSIS — G4731 Primary central sleep apnea: Secondary | ICD-10-CM | POA: Diagnosis not present

## 2017-06-29 DIAGNOSIS — G4733 Obstructive sleep apnea (adult) (pediatric): Secondary | ICD-10-CM | POA: Diagnosis not present

## 2017-07-15 DIAGNOSIS — L282 Other prurigo: Secondary | ICD-10-CM | POA: Diagnosis not present

## 2017-07-24 ENCOUNTER — Encounter: Payer: Self-pay | Admitting: Family Medicine

## 2017-07-25 ENCOUNTER — Encounter: Payer: Self-pay | Admitting: Family Medicine

## 2017-07-25 ENCOUNTER — Ambulatory Visit: Payer: 59 | Admitting: Family Medicine

## 2017-07-25 VITALS — BP 132/84 | HR 95 | Temp 98.4°F | Ht 70.0 in | Wt 277.0 lb

## 2017-07-25 DIAGNOSIS — L270 Generalized skin eruption due to drugs and medicaments taken internally: Secondary | ICD-10-CM | POA: Diagnosis not present

## 2017-07-25 MED ORDER — HYDROXYZINE HCL 25 MG PO TABS: 25.0000 mg | ORAL_TABLET | Freq: Three times a day (TID) | ORAL | 0 refills | Status: DC | PRN

## 2017-07-25 MED ORDER — HYDROXYZINE HCL 25 MG PO TABS
25.0000 mg | ORAL_TABLET | Freq: Three times a day (TID) | ORAL | 0 refills | Status: DC | PRN
Start: 2017-07-25 — End: 2017-09-01

## 2017-07-25 MED ORDER — PREDNISONE 5 MG PO TABS: ORAL_TABLET | ORAL | 0 refills | Status: DC

## 2017-07-25 MED ORDER — METHYLPREDNISOLONE ACETATE 80 MG/ML IJ SUSP
80.0000 mg | Freq: Once | INTRAMUSCULAR | Status: AC
  Administered 2017-07-25: 80 mg via INTRAMUSCULAR

## 2017-07-25 MED ORDER — TRIAMCINOLONE ACETONIDE 0.025 % EX OINT: 1.0000 "application " | TOPICAL_OINTMENT | Freq: Two times a day (BID) | CUTANEOUS | 0 refills | Status: DC

## 2017-07-25 NOTE — Progress Notes (Signed)
Mark BarbanKeifer Augustin CoupeRomer Hodge is a 47 y.o. male is here for follow up.  History of Present Illness:   Mark BottomJamie Wheeley CMA acting as scribe for Dr. Earlene PlaterWallace.  HPI: Patient comes in today for an acute problem.   Rash: Patient states that he has had a rash in different areas of the body. He stated that he has been to urgent care for this. They put him on prednisone that helped in the beginning. The last day he was on the medication the rash came back in a different location.   He recently started a new medication for back pain. The rash has continued since that time.  Health Maintenance Due  Topic Date Due  . HIV Screening  06/11/1984  . URINE MICROALBUMIN  02/08/2015  . PNEUMOCOCCAL POLYSACCHARIDE VACCINE (2) 12/22/2015  . HEMOGLOBIN A1C  01/17/2017  . FOOT EXAM  06/20/2017   Depression screen PHQ 2/9 05/30/2017 08/30/2016  Decreased Interest 3 0  Down, Depressed, Hopeless 3 0  PHQ - 2 Score 6 0  Altered sleeping 3 -  Tired, decreased energy 3 -  Change in appetite 2 -  Feeling bad or failure about yourself  1 -  Trouble concentrating 3 -  Moving slowly or fidgety/restless 2 -  Suicidal thoughts 1 -  PHQ-9 Score 21 -   PMHx, SurgHx, SocialHx, FamHx, Medications, and Allergies were reviewed in the Visit Navigator and updated as appropriate.   Patient Active Problem List   Diagnosis Date Noted  . Chronic post-traumatic stress disorder (PTSD) after military combat 06/03/2017  . Chronic right shoulder pain 06/03/2017  . Depression, recurrent (HCC) 01/01/2017  . Anxiety disorder 01/01/2017  . Chronic pain syndrome 09/03/2016  . Insomnia 07/15/2015  . Trigger thumb, right thumb 05/29/2015  . Carpal tunnel syndrome on right 02/06/2015  . Cervical spondylosis without myelopathy 02/06/2015  . Trigger thumb of right hand 02/06/2015  . Obesity 03/15/2013  . BPH (benign prostatic hypertrophy) 11/15/2012  . Lumbar facet arthropathy 08/02/2011  . Lumbar spondylosis 08/02/2011  . Type 2  diabetes mellitus without complication, without long-term current use of insulin (HCC) 12/22/2010  . Chronic pain of both knees 12/08/2008  . HLD (hyperlipidemia) 08/23/2006  . GOUT 08/23/2006  . Obstructive sleep apnea 08/23/2006  . Allergic rhinitis 08/23/2006  . Chronic low back pain 08/23/2006  . Plantar fasciitis 08/23/2006  . Seizure disorder (HCC) 08/23/2006  . History of CVA (cerebrovascular accident) 08/23/2006   Social History   Tobacco Use  . Smoking status: Former Smoker    Years: 4.00    Types: Cigarettes    Last attempt to quit: 12/22/1990    Years since quitting: 26.6  . Smokeless tobacco: Never Used  . Tobacco comment: 1-2 packs a week  Substance Use Topics  . Alcohol use: No    Alcohol/week: 0.0 oz  . Drug use: No   Current Medications and Allergies:   Current Outpatient Medications:  .  acetaminophen (TYLENOL) 500 MG tablet, Take 1,000 mg by mouth as needed., Disp: , Rfl:  .  aspirin EC 81 MG tablet, Take 1 tablet (81 mg total) by mouth daily., Disp: 30 tablet, Rfl: 0 .  cetirizine (ZYRTEC) 10 MG tablet, Take 10 mg by mouth every other day., Disp: , Rfl:  .  cyclobenzaprine (FLEXERIL) 10 MG tablet, Take 10 mg by mouth 3 (three) times daily., Disp: , Rfl:  .  diclofenac (VOLTAREN) 75 MG EC tablet, Take 75 mg by mouth 2 (two) times daily., Disp: ,  Rfl:  .  diclofenac sodium (VOLTAREN) 1 % GEL, Apply 2 g topically 2 (two) times daily., Disp: , Rfl:  .  fluticasone (FLONASE) 50 MCG/ACT nasal spray, Place 2 sprays into both nostrils daily., Disp: , Rfl:  .  MENTHOL-METHYL SALICYLATE EX, Apply 10-15 % topically 3 (three) times daily., Disp: , Rfl:  .  metFORMIN (GLUCOPHAGE) 500 MG tablet, TAKE 2 TABLETS BY MOUTH TWICE A DAY WITH A MEAL, Disp: 360 tablet, Rfl: 0 .  oxyCODONE-acetaminophen (PERCOCET) 10-325 MG tablet, Take 1 tablet by mouth every 8 (eight) hours as needed (CHRONIC PAIN)., Disp: 90 tablet, Rfl: 0 .  rosuvastatin (CRESTOR) 20 MG tablet, Take 1 tablet  (20 mg total) by mouth daily., Disp: 90 tablet, Rfl: 2 .  sildenafil (VIAGRA) 100 MG tablet, Take 100 mg by mouth as needed for erectile dysfunction (Take one-half tablet by mouth as instructed (take 1 hour prior to sexual activity *do not exceed 1 dose per 24 hour period*))., Disp: , Rfl:  .  traZODone (DESYREL) 100 MG tablet, Take 100 mg by mouth at bedtime as needed for sleep. , Disp: , Rfl: 0 .  venlafaxine XR (EFFEXOR-XR) 75 MG 24 hr capsule, , Disp: , Rfl:  .  verapamil (CALAN) 80 MG tablet, , Disp: , Rfl:    Allergies  Allergen Reactions  . Atorvastatin Swelling  . Tizanidine Hives  . Cymbalta [Duloxetine Hcl]     Stomach upset, difficulty urinating, decreased libido.   Review of Systems   Pertinent items are noted in the HPI. Otherwise, ROS is negative.  Vitals:   Vitals:   07/25/17 1306  BP: 132/84  Pulse: 95  Temp: 98.4 F (36.9 C)  TempSrc: Oral  SpO2: 96%  Weight: 277 lb (125.6 kg)  Height: 5\' 10"  (1.778 m)     Body mass index is 39.75 kg/m.  Physical Exam:   Physical Exam  Constitutional: He is oriented to person, place, and time. He appears well-developed and well-nourished. No distress.  HENT:  Head: Normocephalic and atraumatic.  Right Ear: External ear normal.  Left Ear: External ear normal.  Nose: Nose normal.  Mouth/Throat: Oropharynx is clear and moist.  Eyes: Pupils are equal, round, and reactive to light. Conjunctivae and EOM are normal.  Neck: Normal range of motion. Neck supple.  Cardiovascular: Normal rate, regular rhythm, normal heart sounds and intact distal pulses.  Pulmonary/Chest: Effort normal and breath sounds normal.  Abdominal: Soft. Bowel sounds are normal.  Musculoskeletal: Normal range of motion.  Neurological: He is alert and oriented to person, place, and time.  Skin: Skin is warm and dry. Rash noted. Rash is maculopapular.     Psychiatric: He has a normal mood and affect. His behavior is normal. Judgment and thought content  normal.  Nursing note and vitals reviewed.  Diabetic Foot Exam - Simple   Simple Foot Form Diabetic Foot exam was performed with the following findings:  Yes 07/27/2017 11:56 AM  Visual Inspection No deformities, no ulcerations, no other skin breakdown bilaterally:  Yes Sensation Testing Intact to touch and monofilament testing bilaterally:  Yes Pulse Check Posterior Tibialis and Dorsalis pulse intact bilaterally:  Yes Comments     Results for orders placed or performed in visit on 03/02/17  HM DIABETES EYE EXAM  Result Value Ref Range   HM Diabetic Eye Exam No Retinopathy No Retinopathy    Assessment and Plan:   Shashwat was seen today for rash.  Diagnoses and all orders for this visit:  Drug eruption -     methylPREDNISolone acetate (DEPO-MEDROL) injection 80 mg -     hydrOXYzine (ATARAX/VISTARIL) 25 MG tablet; Take 1 tablet (25 mg total) by mouth every 8 (eight) hours as needed for anxiety or itching (insomnia). -     predniSONE (DELTASONE) 5 MG tablet; 6-5-4-3-2-1-off -     triamcinolone (KENALOG) 0.025 % ointment; Apply 1 application topically 2 (two) times daily.    . Reviewed expectations re: course of current medical issues. . Discussed self-management of symptoms. . Outlined signs and symptoms indicating need for more acute intervention. . Patient verbalized understanding and all questions were answered. Marland Kitchen Health Maintenance issues including appropriate healthy diet, exercise, and smoking avoidance were discussed with patient. . See orders for this visit as documented in the electronic medical record. . Patient received an After Visit Summary.  Helane Rima, DO [place] [place] 07/27/2017  Future Appointments  Date Time Provider Department Center  08/30/2017  7:00 AM Helane Rima, DO [facility]    CMA served as scribe during this visit. History, Physical, and Plan performed by medical provider. The above documentation has been reviewed and is  accurate and complete. Helane Rima, D.O.

## 2017-08-06 ENCOUNTER — Encounter: Payer: Self-pay | Admitting: Family Medicine

## 2017-08-23 DIAGNOSIS — Z8673 Personal history of transient ischemic attack (TIA), and cerebral infarction without residual deficits: Secondary | ICD-10-CM | POA: Diagnosis not present

## 2017-08-23 DIAGNOSIS — G43109 Migraine with aura, not intractable, without status migrainosus: Secondary | ICD-10-CM | POA: Diagnosis not present

## 2017-08-23 DIAGNOSIS — R569 Unspecified convulsions: Secondary | ICD-10-CM | POA: Diagnosis not present

## 2017-08-24 DIAGNOSIS — R51 Headache: Secondary | ICD-10-CM | POA: Diagnosis not present

## 2017-08-27 DIAGNOSIS — G4733 Obstructive sleep apnea (adult) (pediatric): Secondary | ICD-10-CM | POA: Diagnosis not present

## 2017-08-30 ENCOUNTER — Ambulatory Visit: Payer: 59 | Admitting: Family Medicine

## 2017-08-31 NOTE — Progress Notes (Signed)
[name] is a 47 y.o. male is here for follow up.  History of Present Illness:   HPI:   Diabetes: Current symptoms: no polyuria or polydipsia, no chest pain, dyspnea or TIA's.  Taking medication compliantly without noted sided effects []   YES  []   NO  Episodes of hypoglycemia? []   YES  []   NO Maintaining a diabetic diet? []   YES  []   NO Trying to exercise on a regular basis? []   YES  []   NO  On ACE inhibitor or angiotensin II receptor blocker? []   YES  []   NO On Aspirin? []   YES  []   NO  Lab Results  Component Value Date   HGBA1C 8.1 (H) 09/01/2017    Lab Results  Component Value Date   MICROALBUR 1.0 09/01/2017    Lab Results  Component Value Date   CHOL 249 (H) 02/07/2014   HDL 38.90 (L) 02/07/2014   LDLCALC 182 (H) 02/07/2014   LDLDIRECT 148.1 11/04/2010   TRIG 139.0 02/07/2014   CHOLHDL 6 02/07/2014     Wt Readings from Last 3 Encounters:  09/01/17 276 lb 6.4 oz (125.4 kg)  07/25/17 277 lb (125.6 kg)  06/14/17 279 lb 3.2 oz (126.6 kg)   BP Readings from Last 3 Encounters:  09/01/17 118/82  07/25/17 132/84  06/14/17 122/84   Lab Results  Component Value Date   CREATININE 0.94 09/01/2017   Back pain is worsening. Out of work yesterday and today due to pain and inability to function well. Asks about note v FMLA.  Health Maintenance Due  Topic Date Due  . HIV Screening  06/11/1984  . PNEUMOCOCCAL POLYSACCHARIDE VACCINE (2) 12/22/2015  . INFLUENZA VACCINE  08/31/2017   Depression screen [facility] 2/9 09/01/2017 05/30/2017 08/30/2016  Decreased Interest 3 3 0  Down, Depressed, Hopeless 2 3 0  PHQ - 2 Score 5 6 0  Altered sleeping 3 3 -  Tired, decreased energy 3 3 -  Change in appetite 3 2 -  Feeling bad or failure about yourself  1 1 -  Trouble concentrating 3 3 -  Moving slowly or fidgety/restless 3 2 -  Suicidal thoughts 0 1 -  PHQ-9 Score 21 21 -  Difficult doing work/chores Very difficult - -   PMHx, SurgHx, SocialHx, FamHx, Medications,  and Allergies were reviewed in the Visit Navigator and updated as appropriate.   Patient Active Problem List   Diagnosis Date Noted  . Inflammatory spondylopathy of lumbar region (HCC) 09/02/2017  . Chronic post-traumatic stress disorder (PTSD) after military combat 06/03/2017  . Chronic right shoulder pain 06/03/2017  . Depression, recurrent (HCC) 01/01/2017  . Anxiety disorder 01/01/2017  . Chronic pain syndrome 09/03/2016  . Insomnia 07/15/2015  . Trigger thumb, right thumb 05/29/2015  . Carpal tunnel syndrome on right 02/06/2015  . Cervical spondylosis without myelopathy 02/06/2015  . Trigger thumb of right hand 02/06/2015  . Obesity 03/15/2013  . BPH (benign prostatic hypertrophy) 11/15/2012  . Lumbar facet arthropathy 08/02/2011  . Lumbar spondylosis 08/02/2011  . Type 2 diabetes mellitus with diabetic neuropathy, without long-term current use of insulin (HCC) 12/22/2010  . Chronic pain of both knees 12/08/2008  . Hyperlipidemia associated with type 2 diabetes mellitus (HCC), on statin 08/23/2006  . Gout 08/23/2006  . Obstructive sleep apnea 08/23/2006  . Allergic rhinitis 08/23/2006  . Chronic low back pain 08/23/2006  . Seizure disorder (HCC) 08/23/2006  . History of CVA (cerebrovascular accident) 08/23/2006   Social History  Tobacco Use  . Smoking status: Former Smoker    Years: 4.00    Types: Cigarettes    Last attempt to quit: 12/22/1990    Years since quitting: 26.7  . Smokeless tobacco: Never Used  . Tobacco comment: 1-2 packs a week  Substance Use Topics  . Alcohol use: No    Alcohol/week: 0.0 oz  . Drug use: No   Current Medications and Allergies:   Current Outpatient Medications:  .  acetaminophen (TYLENOL) 500 MG tablet, Take 1,000 mg by mouth as needed., Disp: , Rfl:  .  aspirin EC 81 MG tablet, Take 1 tablet (81 mg total) by mouth daily., Disp: 30 tablet, Rfl: 0 .  cetirizine (ZYRTEC) 10 MG tablet, Take 10 mg by mouth every other day., Disp: , Rfl:   .  cyclobenzaprine (FLEXERIL) 10 MG tablet, Take 10 mg by mouth 3 (three) times daily., Disp: , Rfl:  .  diclofenac (VOLTAREN) 75 MG EC tablet, Take 75 mg by mouth 2 (two) times daily., Disp: , Rfl:  .  diclofenac sodium (VOLTAREN) 1 % GEL, Apply 2 g topically 2 (two) times daily., Disp: , Rfl:  .  fluticasone (FLONASE) 50 MCG/ACT nasal spray, Place 2 sprays into both nostrils daily., Disp: , Rfl:  .  MENTHOL-METHYL SALICYLATE EX, Apply 10-15 % topically 3 (three) times daily., Disp: , Rfl:  .  metFORMIN (GLUCOPHAGE) 500 MG tablet, TAKE 2 TABLETS BY MOUTH TWICE A DAY WITH A MEAL, Disp: 360 tablet, Rfl: 0 .  oxyCODONE-acetaminophen (PERCOCET) 10-325 MG tablet, Take 1 tablet by mouth every 8 (eight) hours as needed (CHRONIC PAIN)., Disp: 90 tablet, Rfl: 0 .  rosuvastatin (CRESTOR) 20 MG tablet, Take 1 tablet (20 mg total) by mouth daily., Disp: 90 tablet, Rfl: 2 .  sildenafil (VIAGRA) 100 MG tablet, Take 100 mg by mouth as needed for erectile dysfunction (Take one-half tablet by mouth as instructed (take 1 hour prior to sexual activity *do not exceed 1 dose per 24 hour period*))., Disp: , Rfl:  .  traZODone (DESYREL) 100 MG tablet, Take 100 mg by mouth at bedtime as needed for sleep. , Disp: , Rfl: 0 .  triamcinolone (KENALOG) 0.025 % ointment, Apply 1 application topically 2 (two) times daily., Disp: 454 g, Rfl: 0 .  venlafaxine XR (EFFEXOR-XR) 75 MG 24 hr capsule, , Disp: , Rfl:  .  verapamil (CALAN) 80 MG tablet, , Disp: , Rfl:  .  Dulaglutide (TRULICITY) 0.75 MG/0.5ML SOPN, 0.75 mg [place] q wk x 2 wks , then increase to 1.5 mg [place] if tolerating, Disp: 4 pen, Rfl: 0 .  hydrOXYzine (ATARAX/VISTARIL) 25 MG tablet, Take 1 tablet (25 mg total) by mouth at bedtime as needed for anxiety (insomnia)., Disp: 60 tablet, Rfl: 0   Allergies  Allergen Reactions  . Atorvastatin Swelling  . Tizanidine Hives  . Cymbalta [Duloxetine Hcl]     Stomach upset, difficulty urinating, decreased libido.   Review of  Systems   Pertinent items are noted in the HPI. Otherwise, ROS is negative.  Vitals:   Vitals:   09/01/17 0803  BP: 118/82  Pulse: 80  Temp: 98.5 F (36.9 C)  TempSrc: Oral  SpO2: 97%  Weight: 276 lb 6.4 oz (125.4 kg)  Height: 5\' 10"  (1.778 m)     Body mass index is 39.66 kg/m.  Physical Exam:   Physical Exam  Constitutional: He is oriented to person, place, and time. He appears well-developed and well-nourished. No distress.  HENT:  Head: Normocephalic and atraumatic.  Right Ear: External ear normal.  Left Ear: External ear normal.  Nose: Nose normal.  Mouth/Throat: Oropharynx is clear and moist.  Eyes: Pupils are equal, round, and reactive to light. Conjunctivae and EOM are normal.  Neck: Normal range of motion. Neck supple.  Cardiovascular: Normal rate, regular rhythm, normal heart sounds and intact distal pulses.  Pulmonary/Chest: Effort normal and breath sounds normal.  Abdominal: Soft. Bowel sounds are normal.  Musculoskeletal: Normal range of motion.  Neurological: He is alert and oriented to person, place, and time.  Skin: Skin is warm and dry.  Psychiatric: He has a normal mood and affect. His behavior is normal. Judgment and thought content normal.  Nursing note and vitals reviewed.    Assessment and Plan:   Deiontae was seen today for back pain.  Diagnoses and all orders for this visit:  Type 2 diabetes mellitus with diabetic polyneuropathy, without long-term current use of insulin (HCC) -     Microalbumin / creatinine urine ratio -     Comprehensive metabolic panel -     Hemoglobin A1c  Screening for HIV (human immunodeficiency virus) -     HIV antibody  B12 deficiency -     Vitamin B12  Chronic pain syndrome Comments: Continue current treatment. Work note to be out x 2 weeks. Okay to complete FMLA paperwork.  Inflammatory spondylopathy of lumbar region [facility])  Type 2 diabetes mellitus with diabetic neuropathy, without long-term current use of  insulin (HCC) -     Dulaglutide (TRULICITY) 0.75 MG/0.5ML SOPN; 0.75 mg [place] q wk x 2 wks , then increase to 1.5 mg [place] if tolerating  Depression, recurrent (HCC)  Seizure disorder (HCC)  Urticaria -     hydrOXYzine (ATARAX/VISTARIL) 25 MG tablet; Take 1 tablet (25 mg total) by mouth at bedtime as needed for anxiety (insomnia).  Gout, unspecified cause, unspecified chronicity, unspecified site  Seasonal allergic rhinitis due to pollen  History of CVA (cerebrovascular accident)    . Reviewed expectations re: course of current medical issues. . Discussed self-management of symptoms. . Outlined signs and symptoms indicating need for more acute intervention. . Patient verbalized understanding and all questions were answered. Marland Kitchen Health Maintenance issues including appropriate healthy diet, exercise, and smoking avoidance were discussed with patient. . See orders for this visit as documented in the electronic medical record. . Patient received an After Visit Summary.  Helane Rima, DO [place] [place] 09/02/2017  Future Appointments  Date Time Provider Department Center  12/04/2017  8:00 AM Helane Rima, DO [facility]

## 2017-09-01 ENCOUNTER — Encounter: Payer: Self-pay | Admitting: Surgical

## 2017-09-01 ENCOUNTER — Ambulatory Visit: Payer: 59 | Admitting: Family Medicine

## 2017-09-01 ENCOUNTER — Encounter: Payer: Self-pay | Admitting: Family Medicine

## 2017-09-01 VITALS — BP 118/82 | HR 80 | Temp 98.5°F | Ht 70.0 in | Wt 276.4 lb

## 2017-09-01 DIAGNOSIS — Z114 Encounter for screening for human immunodeficiency virus [HIV]: Secondary | ICD-10-CM

## 2017-09-01 DIAGNOSIS — G894 Chronic pain syndrome: Secondary | ICD-10-CM | POA: Diagnosis not present

## 2017-09-01 DIAGNOSIS — M109 Gout, unspecified: Secondary | ICD-10-CM

## 2017-09-01 DIAGNOSIS — E1142 Type 2 diabetes mellitus with diabetic polyneuropathy: Secondary | ICD-10-CM

## 2017-09-01 DIAGNOSIS — E538 Deficiency of other specified B group vitamins: Secondary | ICD-10-CM | POA: Diagnosis not present

## 2017-09-01 DIAGNOSIS — Z8673 Personal history of transient ischemic attack (TIA), and cerebral infarction without residual deficits: Secondary | ICD-10-CM

## 2017-09-01 DIAGNOSIS — G40909 Epilepsy, unspecified, not intractable, without status epilepticus: Secondary | ICD-10-CM

## 2017-09-01 DIAGNOSIS — M4696 Unspecified inflammatory spondylopathy, lumbar region: Secondary | ICD-10-CM

## 2017-09-01 DIAGNOSIS — E119 Type 2 diabetes mellitus without complications: Secondary | ICD-10-CM

## 2017-09-01 DIAGNOSIS — F339 Major depressive disorder, recurrent, unspecified: Secondary | ICD-10-CM

## 2017-09-01 DIAGNOSIS — E1140 Type 2 diabetes mellitus with diabetic neuropathy, unspecified: Secondary | ICD-10-CM

## 2017-09-01 DIAGNOSIS — L509 Urticaria, unspecified: Secondary | ICD-10-CM

## 2017-09-01 DIAGNOSIS — J301 Allergic rhinitis due to pollen: Secondary | ICD-10-CM

## 2017-09-01 LAB — HEMOGLOBIN A1C: Hgb A1c MFr Bld: 8.1 % — ABNORMAL HIGH (ref 4.6–6.5)

## 2017-09-01 LAB — MICROALBUMIN / CREATININE URINE RATIO
Creatinine,U: 222.1 mg/dL
Microalb Creat Ratio: 0.5 mg/g (ref 0.0–30.0)
Microalb, Ur: 1 mg/dL (ref 0.0–1.9)

## 2017-09-01 LAB — COMPREHENSIVE METABOLIC PANEL
ALT: 12 U/L (ref 0–53)
AST: 11 U/L (ref 0–37)
Albumin: 4.1 g/dL (ref 3.5–5.2)
Alkaline Phosphatase: 57 U/L (ref 39–117)
BUN: 10 mg/dL (ref 6–23)
CO2: 26 mEq/L (ref 19–32)
Calcium: 9.5 mg/dL (ref 8.4–10.5)
Chloride: 103 mEq/L (ref 96–112)
Creatinine, Ser: 0.94 mg/dL (ref 0.40–1.50)
GFR: 110.05 mL/min (ref >60.00)
Glucose, Bld: 135 mg/dL — ABNORMAL HIGH (ref 70–99)
Potassium: 3.9 mEq/L (ref 3.5–5.1)
Sodium: 138 mEq/L (ref 135–145)
Total Bilirubin: 0.4 mg/dL (ref 0.2–1.2)
Total Protein: 7.2 g/dL (ref 6.0–8.3)

## 2017-09-01 LAB — VITAMIN B12: Vitamin B-12: 229 pg/mL (ref 211–911)

## 2017-09-01 MED ORDER — DULAGLUTIDE 0.75 MG/0.5ML ~~LOC~~ SOAJ: SUBCUTANEOUS | 0 refills | Status: DC

## 2017-09-01 MED ORDER — HYDROXYZINE HCL 25 MG PO TABS: 25.0000 mg | ORAL_TABLET | Freq: Every evening | ORAL | 0 refills | Status: DC | PRN

## 2017-09-02 DIAGNOSIS — M4696 Unspecified inflammatory spondylopathy, lumbar region: Secondary | ICD-10-CM | POA: Insufficient documentation

## 2017-09-02 LAB — HIV ANTIBODY (ROUTINE TESTING W REFLEX): HIV 1&2 Ab, 4th Generation: NONREACTIVE

## 2017-09-18 ENCOUNTER — Encounter: Payer: Self-pay | Admitting: Family Medicine

## 2017-10-03 DIAGNOSIS — G4733 Obstructive sleep apnea (adult) (pediatric): Secondary | ICD-10-CM | POA: Diagnosis not present

## 2017-10-06 DIAGNOSIS — Z0279 Encounter for issue of other medical certificate: Secondary | ICD-10-CM

## 2017-10-08 DIAGNOSIS — G4733 Obstructive sleep apnea (adult) (pediatric): Secondary | ICD-10-CM | POA: Diagnosis not present

## 2017-10-09 ENCOUNTER — Other Ambulatory Visit: Payer: Self-pay | Admitting: Family Medicine

## 2017-10-09 DIAGNOSIS — E782 Mixed hyperlipidemia: Secondary | ICD-10-CM

## 2017-10-15 ENCOUNTER — Other Ambulatory Visit: Payer: Self-pay | Admitting: Family Medicine

## 2017-10-15 DIAGNOSIS — E1140 Type 2 diabetes mellitus with diabetic neuropathy, unspecified: Secondary | ICD-10-CM

## 2017-10-17 ENCOUNTER — Telehealth: Payer: Self-pay | Admitting: *Deleted

## 2017-10-17 NOTE — Telephone Encounter (Signed)
Copied from CRM [phone]. Topic: General - Other >> Oct 17, 2017 10:01 AM Marylen PontoMcneil, Ja-Kwan wrote: Reason for CRM: Benson NorwayLinda Diamond Director of [employer] at [employer] states they received a doctor's note for pt to return to work on 09/18/17 but they never received the Certification of Serious Health condition (FMLA) form. She asked that the form be faxed to her at [phone] and if there are any questions or concerns she can be reached at [phone]

## 2017-10-21 ENCOUNTER — Other Ambulatory Visit: Payer: Self-pay | Admitting: Family Medicine

## 2017-10-21 DIAGNOSIS — L509 Urticaria, unspecified: Secondary | ICD-10-CM

## 2017-10-23 ENCOUNTER — Other Ambulatory Visit: Payer: Self-pay | Admitting: Family Medicine

## 2017-10-23 DIAGNOSIS — L509 Urticaria, unspecified: Secondary | ICD-10-CM

## 2017-11-07 DIAGNOSIS — M545 Low back pain: Secondary | ICD-10-CM | POA: Diagnosis not present

## 2017-11-07 DIAGNOSIS — G8929 Other chronic pain: Secondary | ICD-10-CM | POA: Diagnosis not present

## 2017-11-21 ENCOUNTER — Encounter: Payer: Self-pay | Admitting: Family Medicine

## 2017-11-21 ENCOUNTER — Encounter: Payer: Self-pay | Admitting: Surgical

## 2017-11-21 ENCOUNTER — Ambulatory Visit: Payer: 59 | Admitting: Family Medicine

## 2017-11-21 VITALS — BP 120/78 | HR 92 | Temp 98.3°F | Ht 70.0 in | Wt 274.0 lb

## 2017-11-21 DIAGNOSIS — M5441 Lumbago with sciatica, right side: Secondary | ICD-10-CM

## 2017-11-21 DIAGNOSIS — M5442 Lumbago with sciatica, left side: Secondary | ICD-10-CM | POA: Diagnosis not present

## 2017-11-21 DIAGNOSIS — G8929 Other chronic pain: Secondary | ICD-10-CM

## 2017-11-21 DIAGNOSIS — Z23 Encounter for immunization: Secondary | ICD-10-CM | POA: Diagnosis not present

## 2017-11-21 DIAGNOSIS — J209 Acute bronchitis, unspecified: Secondary | ICD-10-CM

## 2017-11-21 DIAGNOSIS — J0100 Acute maxillary sinusitis, unspecified: Secondary | ICD-10-CM

## 2017-11-21 MED ORDER — AMOXICILLIN-POT CLAVULANATE 875-125 MG PO TABS: 1.0000 | ORAL_TABLET | Freq: Two times a day (BID) | ORAL | 0 refills | Status: DC

## 2017-11-21 MED ORDER — PREDNISONE 5 MG PO TABS: ORAL_TABLET | ORAL | 0 refills | Status: DC

## 2017-11-21 MED ORDER — OXYCODONE-ACETAMINOPHEN 10-325 MG PO TABS: 1.0000 | ORAL_TABLET | ORAL | 0 refills | Status: AC | PRN

## 2017-11-21 NOTE — Progress Notes (Signed)
[name] is a 47 y.o. male is here for follow up.  History of Present Illness:   HPI:   1. Chronic midline low back pain with bilateral sciatica. S/P 3 injections for back at [place], with last being 10/6. Still in PT.    2. Subacute maxillary sinusitis. Left, with pressure, congestion, and malaise.     3. Bronchitis with bronchospasm. Cough, productive, without wheeze, SOB, or fever. Nonsmoker. Has tried OTC cough medication. Worse at night.     There are no preventive care reminders to display for this patient. Depression screen [facility] 2/9 09/01/2017 05/30/2017 08/30/2016  Decreased Interest 3 3 0  Down, Depressed, Hopeless 2 3 0  PHQ - 2 Score 5 6 0  Altered sleeping 3 3 -  Tired, decreased energy 3 3 -  Change in appetite 3 2 -  Feeling bad or failure about yourself  1 1 -  Trouble concentrating 3 3 -  Moving slowly or fidgety/restless 3 2 -  Suicidal thoughts 0 1 -  PHQ-9 Score 21 21 -  Difficult doing work/chores Very difficult - -   PMHx, SurgHx, SocialHx, FamHx, Medications, and Allergies were reviewed in the Visit Navigator and updated as appropriate.   Patient Active Problem List   Diagnosis Date Noted  . Inflammatory spondylopathy of lumbar region (HCC) 09/02/2017  . Chronic post-traumatic stress disorder (PTSD) after military combat 06/03/2017  . Chronic right shoulder pain 06/03/2017  . Depression, recurrent (HCC) 01/01/2017  . Anxiety disorder 01/01/2017  . Chronic pain syndrome 09/03/2016  . Insomnia 07/15/2015  . Trigger thumb, right thumb 05/29/2015  . Carpal tunnel syndrome on right 02/06/2015  . Cervical spondylosis without myelopathy 02/06/2015  . Trigger thumb of right hand 02/06/2015  . Obesity 03/15/2013  . BPH (benign prostatic hypertrophy) 11/15/2012  . Lumbar facet arthropathy 08/02/2011  . Lumbar spondylosis 08/02/2011  . Type 2 diabetes mellitus with diabetic neuropathy, without long-term current use of insulin (HCC) 12/22/2010  .  Chronic pain of both knees 12/08/2008  . Hyperlipidemia associated with type 2 diabetes mellitus (HCC), on statin 08/23/2006  . Gout 08/23/2006  . Obstructive sleep apnea 08/23/2006  . Allergic rhinitis 08/23/2006  . Chronic low back pain 08/23/2006  . Seizure disorder (HCC) 08/23/2006  . History of CVA (cerebrovascular accident) 08/23/2006   Social History   Tobacco Use  . Smoking status: Former Smoker    Years: 4.00    Types: Cigarettes    Last attempt to quit: 12/22/1990    Years since quitting: 26.9  . Smokeless tobacco: Never Used  . Tobacco comment: 1-2 packs a week  Substance Use Topics  . Alcohol use: No    Alcohol/week: 0.0 standard drinks  . Drug use: No   Current Medications and Allergies:   .  acetaminophen (TYLENOL) 500 MG tablet, Take 1,000 mg by mouth as needed., Disp: , Rfl:  .  aspirin EC 81 MG tablet, Take 1 tablet (81 mg total) by mouth daily., Disp: 30 tablet, Rfl: 0 .  cetirizine (ZYRTEC) 10 MG tablet, Take 10 mg by mouth every other day., Disp: , Rfl:  .  cyclobenzaprine (FLEXERIL) 10 MG tablet, Take 10 mg by mouth 3 (three) times daily., Disp: , Rfl:  .  diclofenac (VOLTAREN) 75 MG EC tablet, Take 75 mg by mouth 2 (two) times daily., Disp: , Rfl:  .  diclofenac sodium (VOLTAREN) 1 % GEL, Apply 2 g topically 2 (two) times daily., Disp: , Rfl:  .  fluticasone (FLONASE) 50 MCG/ACT nasal spray, Place 2 sprays into both nostrils daily., Disp: , Rfl:  .  hydrOXYzine (ATARAX/VISTARIL) 25 MG tablet, TAKE 1 TABLET BY MOUTH AT BEDTIME AS NEEDED FOR ANXIETY ., Disp: 90 tablet, Rfl: 1 .  hydrOXYzine (ATARAX/VISTARIL) 25 MG tablet, TAKE 1 TABLET BY MOUTH AT BEDTIME AS NEEDED FOR ANXIETY ., Disp: 30 tablet, Rfl: 1 .  MENTHOL-METHYL SALICYLATE EX, Apply 10-15 % topically 3 (three) times daily., Disp: , Rfl:  .  metFORMIN (GLUCOPHAGE) 500 MG tablet, TAKE 2 TABLETS BY MOUTH TWICE A DAY WITH A MEAL, Disp: 360 tablet, Rfl: 0 .  oxyCODONE-acetaminophen (PERCOCET) 10-325 MG  tablet, Take 1 tablet by mouth every 8 (eight) hours as needed (CHRONIC PAIN)., Disp: 90 tablet, Rfl: 0 .  rosuvastatin (CRESTOR) 20 MG tablet, TAKE 1 TABLET BY MOUTH EVERY DAY, Disp: 90 tablet, Rfl: 2 .  sildenafil (VIAGRA) 100 MG tablet, Take 100 mg by mouth as needed for erectile dysfunction (Take one-half tablet by mouth as instructed (take 1 hour prior to sexual activity *do not exceed 1 dose per 24 hour period*))., Disp: , Rfl:  .  traZODone (DESYREL) 100 MG tablet, Take 100 mg by mouth at bedtime as needed for sleep. , Disp: , Rfl: 0 .  triamcinolone (KENALOG) 0.025 % ointment, Apply 1 application topically 2 (two) times daily., Disp: 454 g, Rfl: 0 .  TRULICITY 0.75 MG/0.5ML SOPN, INJECT 0.75MG  SUB-Q EVERY WEEK FOR 2 WEEKS, THEN INCREASE TO 1.5MG  SUB-Q IF TOLERATING, Disp: 2 pen, Rfl: 1 .  venlafaxine XR (EFFEXOR-XR) 75 MG 24 hr capsule, , Disp: , Rfl:  .  verapamil (CALAN) 80 MG tablet, , Disp: , Rfl:    Allergies  Allergen Reactions  . Atorvastatin Swelling  . Tizanidine Hives  . Cymbalta [Duloxetine Hcl]     Stomach upset, difficulty urinating, decreased libido.   Review of Systems   Pertinent items are noted in the HPI. Otherwise, ROS is negative.  Vitals:   Vitals:   11/21/17 0830  BP: 120/78  Pulse: 92  Temp: 98.3 F (36.8 C)  TempSrc: Oral  SpO2: 97%  Weight: 274 lb (124.3 kg)  Height: 5\' 10"  (1.778 m)     Body mass index is 39.31 kg/m.  Physical Exam:   Physical Exam  Constitutional: He is oriented to person, place, and time. He appears well-developed and well-nourished. No distress.  HENT:  Head: Normocephalic and atraumatic.  Right Ear: External ear normal.  Left Ear: External ear normal.  Nose: Left sinus exhibits maxillary sinus tenderness and frontal sinus tenderness.  Mouth/Throat: Oropharynx is clear and moist.  Eyes: Pupils are equal, round, and reactive to light. Conjunctivae and EOM are normal.  Neck: Normal range of motion. Neck supple.    Cardiovascular: Normal rate, regular rhythm, normal heart sounds and intact distal pulses.  Pulmonary/Chest: Effort normal and breath sounds normal.  Abdominal: Soft. Bowel sounds are normal.  Neurological: He is alert and oriented to person, place, and time.  Skin: Skin is warm and dry.  Psychiatric: He has a normal mood and affect. His behavior is normal. Judgment and thought content normal.  Nursing note and vitals reviewed.  Assessment and Plan:   Diagnoses and all orders for this visit:  Chronic midline low back pain with bilateral sciatica -     oxyCODONE-acetaminophen (PERCOCET) 10-325 MG tablet; Take 1 tablet by mouth every 4 (four) hours as needed for up to 5 days for pain.  Subacute maxillary sinusitis -  predniSONE (DELTASONE) 5 MG tablet; 6-6-5-5-4-4-3-3-2-2-1-1-0 -     amoxicillin-clavulanate (AUGMENTIN) 875-125 MG tablet; Take 1 tablet by mouth 2 (two) times daily.  Bronchitis with bronchospasm -     predniSONE (DELTASONE) 5 MG tablet; 6-6-5-5-4-4-3-3-2-2-1-1-0  Need for immunization against influenza -     Flu Vaccine QUAD 36+ mos IM   . Reviewed expectations re: course of current medical issues. . Discussed self-management of symptoms. . Outlined signs and symptoms indicating need for more acute intervention. . Patient verbalized understanding and all questions were answered. Marland Kitchen Health Maintenance issues including appropriate healthy diet, exercise, and smoking avoidance were discussed with patient. . See orders for this visit as documented in the electronic medical record. . Patient received an After Visit Summary.  Helane Rima, DO [place], [facility] 11/26/2017

## 2017-11-25 DIAGNOSIS — G4733 Obstructive sleep apnea (adult) (pediatric): Secondary | ICD-10-CM | POA: Diagnosis not present

## 2017-12-03 NOTE — Progress Notes (Deleted)
[name] is a 47 y.o. male is here for follow up.  History of Present Illness:   HPI: See Assessment and Plan section for Problem Based Charting of issues discussed today.   There are no preventive care reminders to display for this patient. Depression screen [facility] 2/9 09/01/2017 05/30/2017 08/30/2016  Decreased Interest 3 3 0  Down, Depressed, Hopeless 2 3 0  PHQ - 2 Score 5 6 0  Altered sleeping 3 3 -  Tired, decreased energy 3 3 -  Change in appetite 3 2 -  Feeling bad or failure about yourself  1 1 -  Trouble concentrating 3 3 -  Moving slowly or fidgety/restless 3 2 -  Suicidal thoughts 0 1 -  PHQ-9 Score 21 21 -  Difficult doing work/chores Very difficult - -   PMHx, SurgHx, SocialHx, FamHx, Medications, and Allergies were reviewed in the Visit Navigator and updated as appropriate.   Patient Active Problem List   Diagnosis Date Noted  . Inflammatory spondylopathy of lumbar region (HCC) 09/02/2017  . Chronic post-traumatic stress disorder (PTSD) after military combat 06/03/2017  . Chronic right shoulder pain 06/03/2017  . Depression, recurrent (HCC) 01/01/2017  . Anxiety disorder 01/01/2017  . Chronic pain syndrome 09/03/2016  . Insomnia 07/15/2015  . Trigger thumb, right thumb 05/29/2015  . Carpal tunnel syndrome on right 02/06/2015  . Cervical spondylosis without myelopathy 02/06/2015  . Trigger thumb of right hand 02/06/2015  . Obesity 03/15/2013  . BPH (benign prostatic hypertrophy) 11/15/2012  . Lumbar facet arthropathy 08/02/2011  . Lumbar spondylosis 08/02/2011  . Type 2 diabetes mellitus with diabetic neuropathy, without long-term current use of insulin (HCC) 12/22/2010  . Chronic pain of both knees 12/08/2008  . Hyperlipidemia associated with type 2 diabetes mellitus (HCC), on statin 08/23/2006  . Gout 08/23/2006  . Obstructive sleep apnea 08/23/2006  . Allergic rhinitis 08/23/2006  . Chronic low back pain 08/23/2006  . Seizure disorder (HCC)  08/23/2006  . History of CVA (cerebrovascular accident) 08/23/2006   Social History   Tobacco Use  . Smoking status: Former Smoker    Years: 4.00    Types: Cigarettes    Last attempt to quit: 12/22/1990    Years since quitting: 26.9  . Smokeless tobacco: Never Used  . Tobacco comment: 1-2 packs a week  Substance Use Topics  . Alcohol use: No    Alcohol/week: 0.0 standard drinks  . Drug use: No   Current Medications and Allergies:   Current Outpatient Medications:  .  acetaminophen (TYLENOL) 500 MG tablet, Take 1,000 mg by mouth as needed., Disp: , Rfl:  .  amoxicillin-clavulanate (AUGMENTIN) 875-125 MG tablet, Take 1 tablet by mouth 2 (two) times daily., Disp: 20 tablet, Rfl: 0 .  aspirin EC 81 MG tablet, Take 1 tablet (81 mg total) by mouth daily., Disp: 30 tablet, Rfl: 0 .  cetirizine (ZYRTEC) 10 MG tablet, Take 10 mg by mouth every other day., Disp: , Rfl:  .  cyclobenzaprine (FLEXERIL) 10 MG tablet, Take 10 mg by mouth 3 (three) times daily., Disp: , Rfl:  .  diclofenac (VOLTAREN) 75 MG EC tablet, Take 75 mg by mouth 2 (two) times daily., Disp: , Rfl:  .  diclofenac sodium (VOLTAREN) 1 % GEL, Apply 2 g topically 2 (two) times daily., Disp: , Rfl:  .  fluticasone (FLONASE) 50 MCG/ACT nasal spray, Place 2 sprays into both nostrils daily., Disp: , Rfl:  .  hydrOXYzine (ATARAX/VISTARIL) 25 MG tablet, TAKE 1  TABLET BY MOUTH AT BEDTIME AS NEEDED FOR ANXIETY ., Disp: 30 tablet, Rfl: 1 .  MENTHOL-METHYL SALICYLATE EX, Apply 10-15 % topically 3 (three) times daily., Disp: , Rfl:  .  metFORMIN (GLUCOPHAGE) 500 MG tablet, TAKE 2 TABLETS BY MOUTH TWICE A DAY WITH A MEAL, Disp: 360 tablet, Rfl: 0 .  predniSONE (DELTASONE) 5 MG tablet, 6-6-5-5-4-4-3-3-2-2-1-1-0, Disp: 21 tablet, Rfl: 0 .  rosuvastatin (CRESTOR) 20 MG tablet, TAKE 1 TABLET BY MOUTH EVERY DAY, Disp: 90 tablet, Rfl: 2 .  sildenafil (VIAGRA) 100 MG tablet, Take 100 mg by mouth as needed for erectile dysfunction (Take one-half  tablet by mouth as instructed (take 1 hour prior to sexual activity *do not exceed 1 dose per 24 hour period*))., Disp: , Rfl:  .  traZODone (DESYREL) 100 MG tablet, Take 100 mg by mouth at bedtime as needed for sleep. , Disp: , Rfl: 0 .  triamcinolone (KENALOG) 0.025 % ointment, Apply 1 application topically 2 (two) times daily., Disp: 454 g, Rfl: 0 .  TRULICITY 0.75 MG/0.5ML SOPN, INJECT 0.75MG  SUB-Q EVERY WEEK FOR 2 WEEKS, THEN INCREASE TO 1.5MG  SUB-Q IF TOLERATING, Disp: 2 pen, Rfl: 1 .  venlafaxine XR (EFFEXOR-XR) 150 MG 24 hr capsule, Take 1 capsule by mouth daily., Disp: , Rfl:  .  verapamil (CALAN) 80 MG tablet, , Disp: , Rfl:   Allergies  Allergen Reactions  . Atorvastatin Swelling  . Tizanidine Hives  . Cymbalta [Duloxetine Hcl]     Stomach upset, difficulty urinating, decreased libido.   Review of Systems   Pertinent items are noted in the HPI. Otherwise, ROS is negative.  Vitals:  There were no vitals filed for this visit.   There is no height or weight on file to calculate BMI.  Physical Exam:   Physical Exam  Results for orders placed or performed in visit on 09/01/17  HIV antibody  Result Value Ref Range   HIV 1&2 Ab, 4th Generation NON-REACTIVE NON-REACTI  Microalbumin / creatinine urine ratio  Result Value Ref Range   Microalb, Ur 1.0 0.0 - 1.9 mg/dL   Creatinine,U 161.0 mg/dL   Microalb Creat Ratio 0.5 0.0 - 30.0 mg/g  Comprehensive metabolic panel  Result Value Ref Range   Sodium 138 135 - 145 mEq/L   Potassium 3.9 3.5 - 5.1 mEq/L   Chloride 103 96 - 112 mEq/L   CO2 26 19 - 32 mEq/L   Glucose, Bld 135 (H) 70 - 99 mg/dL   BUN 10 6 - 23 mg/dL   Creatinine, Ser 9.60 0.40 - 1.50 mg/dL   Total Bilirubin 0.4 0.2 - 1.2 mg/dL   Alkaline Phosphatase 57 39 - 117 U/L   AST 11 0 - 37 U/L   ALT 12 0 - 53 U/L   Total Protein 7.2 6.0 - 8.3 g/dL   Albumin 4.1 3.5 - 5.2 g/dL   Calcium 9.5 8.4 - 45.4 mg/dL   GFR 098.11 >91.47 mL/min  Hemoglobin A1c  Result Value  Ref Range   Hgb A1c MFr Bld 8.1 (H) 4.6 - 6.5 %  Vitamin B12  Result Value Ref Range   Vitamin B-12 229 211 - 911 pg/mL    Assessment and Plan:   No problem-specific Assessment & Plan notes found for this encounter.   No orders of the defined types were placed in this encounter.   No orders of the defined types were placed in this encounter.   . Reviewed expectations re: course of current medical issues. Marland Kitchen  Discussed self-management of symptoms. . Outlined signs and symptoms indicating need for more acute intervention. . Patient verbalized understanding and all questions were answered. Marland Kitchen Health Maintenance issues including appropriate healthy diet, exercise, and smoking avoidance were discussed with patient. . See orders for this visit as documented in the electronic medical record. . Patient received an After Visit Summary.  Briscoe Deutscher, DO [place], [facility] 12/03/2017

## 2017-12-04 ENCOUNTER — Ambulatory Visit: Payer: 59 | Admitting: Family Medicine

## 2017-12-05 ENCOUNTER — Encounter: Payer: Self-pay | Admitting: Surgical

## 2017-12-05 ENCOUNTER — Ambulatory Visit: Payer: 59 | Admitting: Family Medicine

## 2017-12-05 ENCOUNTER — Encounter: Payer: Self-pay | Admitting: Family Medicine

## 2017-12-05 VITALS — BP 128/76 | HR 97 | Temp 97.7°F | Ht 70.0 in | Wt 279.0 lb

## 2017-12-05 DIAGNOSIS — E538 Deficiency of other specified B group vitamins: Secondary | ICD-10-CM

## 2017-12-05 DIAGNOSIS — E1142 Type 2 diabetes mellitus with diabetic polyneuropathy: Secondary | ICD-10-CM

## 2017-12-05 DIAGNOSIS — E782 Mixed hyperlipidemia: Secondary | ICD-10-CM | POA: Diagnosis not present

## 2017-12-05 DIAGNOSIS — G894 Chronic pain syndrome: Secondary | ICD-10-CM | POA: Diagnosis not present

## 2017-12-05 DIAGNOSIS — F339 Major depressive disorder, recurrent, unspecified: Secondary | ICD-10-CM

## 2017-12-05 LAB — LIPID PANEL
Cholesterol: 262 mg/dL — ABNORMAL HIGH (ref 0–200)
HDL: 52 mg/dL (ref >39.00)
LDL Cholesterol: 182 mg/dL — ABNORMAL HIGH (ref 0–99)
NonHDL: 209.77
Total CHOL/HDL Ratio: 5
Triglycerides: 141 mg/dL (ref 0.0–149.0)
VLDL: 28.2 mg/dL (ref 0.0–40.0)

## 2017-12-05 LAB — HEMOGLOBIN A1C: Hgb A1c MFr Bld: 7.2 % — ABNORMAL HIGH (ref 4.6–6.5)

## 2017-12-05 MED ORDER — CYANOCOBALAMIN 1000 MCG/ML IJ SOLN
1000.0000 ug | Freq: Once | INTRAMUSCULAR | Status: AC
  Administered 2017-12-05: 1000 ug via INTRAMUSCULAR

## 2017-12-05 NOTE — Progress Notes (Signed)
[name] is a 47 y.o. male is here for follow up.  History of Present Illness:   HPI: See Assessment and Plan section for Problem Based Charting of issues discussed today.   1. Back pain. Still severe. Doing all non-medication treatments we have discussed, including ice and PT. Interested in spinal cord stimulator (previously d/w [facility] in [place] and Dr. Dutch Quint at [facility]).  2. Low energy. 3. Weight gain. Discussed Bariatric surgery as an option.  4. DM, HTN, HLD.  Current symptoms: no polyuria or polydipsia, no chest pain, dyspnea or TIA's.  Lab Results  Component Value Date   HGBA1C 7.2 (H) 12/05/2017    Lab Results  Component Value Date   MICROALBUR 1.0 09/01/2017    Lab Results  Component Value Date   CHOL 262 (H) 12/05/2017   HDL 52.00 12/05/2017   LDLCALC 182 (H) 12/05/2017   LDLDIRECT 148.1 11/04/2010   TRIG 141.0 12/05/2017   CHOLHDL 5 12/05/2017     Wt Readings from Last 3 Encounters:  12/05/17 279 lb (126.6 kg)  11/21/17 274 lb (124.3 kg)  09/01/17 276 lb 6.4 oz (125.4 kg)   BP Readings from Last 3 Encounters:  12/05/17 128/76  11/21/17 120/78  09/01/17 118/82   Lab Results  Component Value Date   CREATININE 0.94 09/01/2017   Depression screen PHQ 2/9 09/01/2017 05/30/2017 08/30/2016  Decreased Interest 3 3 0  Down, Depressed, Hopeless 2 3 0  PHQ - 2 Score 5 6 0  Altered sleeping 3 3 -  Tired, decreased energy 3 3 -  Change in appetite 3 2 -  Feeling bad or failure about yourself  1 1 -  Trouble concentrating 3 3 -  Moving slowly or fidgety/restless 3 2 -  Suicidal thoughts 0 1 -  PHQ-9 Score 21 21 -  Difficult doing work/chores Very difficult - -   PMHx, SurgHx, SocialHx, FamHx, Medications, and Allergies were reviewed in the Visit Navigator and updated as appropriate.   Patient Active Problem List   Diagnosis Date Noted  . Vitamin B 12 deficiency 12/09/2017  . Mixed hyperlipidemia 12/09/2017  . Inflammatory  spondylopathy of lumbar region (HCC) 09/02/2017  . Chronic post-traumatic stress disorder (PTSD) after military combat 06/03/2017  . Chronic right shoulder pain 06/03/2017  . Depression, recurrent (HCC) 01/01/2017  . Anxiety disorder 01/01/2017  . Chronic pain syndrome 09/03/2016  . Insomnia 07/15/2015  . Trigger thumb, right thumb 05/29/2015  . Carpal tunnel syndrome on right 02/06/2015  . Cervical spondylosis without myelopathy 02/06/2015  . Trigger thumb of right hand 02/06/2015  . Obesity 03/15/2013  . BPH (benign prostatic hypertrophy) 11/15/2012  . Lumbar facet arthropathy 08/02/2011  . Lumbar spondylosis 08/02/2011  . Type 2 diabetes mellitus with diabetic polyneuropathy, without long-term current use of insulin (HCC) 12/22/2010  . Chronic pain of both knees 12/08/2008  . Hyperlipidemia associated with type 2 diabetes mellitus (HCC), on statin 08/23/2006  . Gout 08/23/2006  . Obstructive sleep apnea 08/23/2006  . Allergic rhinitis 08/23/2006  . Chronic low back pain 08/23/2006  . Seizure disorder (HCC) 08/23/2006  . History of CVA (cerebrovascular accident) 08/23/2006   Social History   Tobacco Use  . Smoking status: Former Smoker    Years: 4.00    Types: Cigarettes    Last attempt to quit: 12/22/1990    Years since quitting: 26.9  . Smokeless tobacco: Never Used  . Tobacco comment: 1-2 packs a week  Substance Use Topics  .  Alcohol use: No    Alcohol/week: 0.0 standard drinks  . Drug use: No   Current Medications and Allergies:   Current Outpatient Medications:  .  acetaminophen (TYLENOL) 500 MG tablet, Take 1,000 mg by mouth as needed., Disp: , Rfl:  .  amoxicillin-clavulanate (AUGMENTIN) 875-125 MG tablet, Take 1 tablet by mouth 2 (two) times daily., Disp: 20 tablet, Rfl: 0 .  aspirin EC 81 MG tablet, Take 1 tablet (81 mg total) by mouth daily., Disp: 30 tablet, Rfl: 0 .  cetirizine (ZYRTEC) 10 MG tablet, Take 10 mg by mouth every other day., Disp: , Rfl:  .   cyclobenzaprine (FLEXERIL) 10 MG tablet, Take 10 mg by mouth 3 (three) times daily., Disp: , Rfl:  .  diclofenac (VOLTAREN) 75 MG EC tablet, Take 75 mg by mouth 2 (two) times daily., Disp: , Rfl:  .  diclofenac sodium (VOLTAREN) 1 % GEL, Apply 2 g topically 2 (two) times daily., Disp: , Rfl:  .  fluticasone (FLONASE) 50 MCG/ACT nasal spray, Place 2 sprays into both nostrils daily., Disp: , Rfl:  .  hydrOXYzine (ATARAX/VISTARIL) 25 MG tablet, TAKE 1 TABLET BY MOUTH AT BEDTIME AS NEEDED FOR ANXIETY ., Disp: 30 tablet, Rfl: 1 .  MENTHOL-METHYL SALICYLATE EX, Apply 10-15 % topically 3 (three) times daily., Disp: , Rfl:  .  metFORMIN (GLUCOPHAGE) 500 MG tablet, TAKE 2 TABLETS BY MOUTH TWICE A DAY WITH A MEAL, Disp: 360 tablet, Rfl: 0 .  predniSONE (DELTASONE) 5 MG tablet, 6-6-5-5-4-4-3-3-2-2-1-1-0, Disp: 21 tablet, Rfl: 0 .  rosuvastatin (CRESTOR) 20 MG tablet, TAKE 1 TABLET BY MOUTH EVERY DAY, Disp: 90 tablet, Rfl: 2 .  sildenafil (VIAGRA) 100 MG tablet, Take 100 mg by mouth as needed for erectile dysfunction (Take one-half tablet by mouth as instructed (take 1 hour prior to sexual activity *do not exceed 1 dose per 24 hour period*))., Disp: , Rfl:  .  traZODone (DESYREL) 100 MG tablet, Take 100 mg by mouth at bedtime as needed for sleep. , Disp: , Rfl: 0 .  triamcinolone (KENALOG) 0.025 % ointment, Apply 1 application topically 2 (two) times daily., Disp: 454 g, Rfl: 0 .  TRULICITY 0.75 MG/0.5ML SOPN, INJECT 0.75MG  SUB-Q EVERY WEEK FOR 2 WEEKS, THEN INCREASE TO 1.5MG  SUB-Q IF TOLERATING, Disp: 2 pen, Rfl: 1 .  venlafaxine XR (EFFEXOR-XR) 150 MG 24 hr capsule, Take 1 capsule by mouth daily., Disp: , Rfl:  .  verapamil (CALAN) 80 MG tablet, , Disp: , Rfl:    Allergies  Allergen Reactions  . Atorvastatin Swelling  . Tizanidine Hives  . Cymbalta [Duloxetine Hcl]     Stomach upset, difficulty urinating, decreased libido.   Review of Systems   Pertinent items are noted in the HPI. Otherwise, ROS is  negative.  Vitals:   Vitals:   12/05/17 1000  BP: 128/76  Pulse: 97  Temp: 97.7 F (36.5 C)  TempSrc: Oral  SpO2: 97%  Weight: 279 lb (126.6 kg)  Height: 5\' 10"  (1.778 m)     Body mass index is 40.03 kg/m.  Physical Exam:   Physical Exam  Constitutional: He is oriented to person, place, and time. He appears well-developed and well-nourished. No distress.  HENT:  Head: Normocephalic and atraumatic.  Right Ear: External ear normal.  Left Ear: External ear normal.  Nose: Nose normal.  Mouth/Throat: Oropharynx is clear and moist.  Eyes: Pupils are equal, round, and reactive to light. Conjunctivae and EOM are normal.  Neck: Normal range of motion.  Neck supple.  Cardiovascular: Normal rate, regular rhythm and intact distal pulses.  Pulmonary/Chest: Effort normal and breath sounds normal.  Abdominal: Soft. Bowel sounds are normal.  Musculoskeletal:       Lumbar back: He exhibits decreased range of motion and spasm.  Neurological: He is alert and oriented to person, place, and time. He displays abnormal reflex.  Skin: Skin is warm and dry.  Psychiatric: He has a normal mood and affect. His behavior is normal. Judgment and thought content normal.  Nursing note and vitals reviewed.   Results for orders placed or performed in visit on 12/05/17  Lipid panel  Result Value Ref Range   Cholesterol 262 (H) 0 - 200 mg/dL   Triglycerides 956.2 0.0 - 149.0 mg/dL   HDL 13.08 >65.78 mg/dL   VLDL 46.9 0.0 - 62.9 mg/dL   LDL Cholesterol 528 (H) 0 - 99 mg/dL   Total CHOL/HDL Ratio 5    NonHDL 209.77   Hemoglobin A1c  Result Value Ref Range   Hgb A1c MFr Bld 7.2 (H) 4.6 - 6.5 %    Assessment and Plan:   Vitamin B 12 deficiency Lab Results  Component Value Date   VITAMINB12 229 09/01/2017   Patient currently taking Metformin, which will decrease B12. Has polyneuropathy so need to replace to see if it improves. Should take 1000 mcg daily orally.   Type 2 diabetes mellitus with  diabetic polyneuropathy, without long-term current use of insulin (HCC) Medication compliance: compliant all of the time, diabetic diet compliance: noncompliant some of the time, home glucose monitoring: is not performed, further diabetic ROS: no polyuria or polydipsia, no chest pain, dyspnea or TIA's, no hypoglycemia, no medication side effects noted.  Diabetes Health Maintenance Due  Topic Date Due  . OPHTHALMOLOGY EXAM  02/21/2018  . HEMOGLOBIN A1C  06/05/2018  . FOOT EXAM  07/26/2018  . URINE MICROALBUMIN  09/02/2018   Diabetes QM Metrics Latest Ref Rng & Units 12/05/2017 09/01/2017 02/21/2017  HbA1c 4.6 - 6.5 % 7.2(H) 8.1(H) -  Microalb 0.0 - 1.9 mg/dL - 1.0 -  Micro/Creat Ratio 0.0 - 30.0 mg/g - 0.5 -  LDL Cacl 0 - 99 mg/dL 413(K) - -  Eye Exam No Retinopathy - - No Retinopathy  Some recent data might be hidden   BP& BMI  12/05/2017 11/21/2017 09/01/2017 07/25/2017 06/14/2017  BP 128/76 120/78 118/82 132/84 122/84  BMI 40.03 39.31 39.66 39.75 40.06  Some recent data might be hidden    Plan: 1.  Patient is counseled on appropriate foot care. 2.  BP goal < 130/80. 3.  LDL goal of < 100, HDL > 40 and TG < 150.  4.  Eye Exam yearly and Dental Exam every 6 months. 5.  Dietary recommendations: < 100 g carbohydrates daily. 6.  Physical Activity recommendations: as tolerated. 7.  Pneumovax at diagnosis and once 65+. Wait five years between first dose and dose after 65.  8.  Influenza annually.   Mixed hyperlipidemia Is the patient taking medications without problems? No, not regularly. Trying to exercise on a regular basis? No. Compliant with diet? No.  Lipid Panel  Lab Results  Component Value Date   CHOL 262 (H) 12/05/2017   HDL 52.00 12/05/2017   LDLCALC 182 (H) 12/05/2017   LDLDIRECT 148.1 11/04/2010   TRIG 141.0 12/05/2017   CHOLHDL 5 12/05/2017   Lab Results  Component Value Date   ALT 12 09/01/2017   AST 11 09/01/2017   ALKPHOS 57 09/01/2017  BILITOT 0.4 09/01/2017    Dyslipidemia under poor control.  Plan: 1. Continue dietary measures. 2. Continue regular exercise, an average 40 minutes of moderate to vigorous-intensity aerobic activity 3 or 4 times per week. 3. Lipid-lowering medications: restart statin.  Depression, recurrent (HCC) Current symptoms include depressed mood and hopelessness. Symptoms have been unchanged since that time. Patient denies recurrent thoughts of death and suicidal thoughts with specific plan. Previous treatment includes: medication. He complains of the following side effects from the treatment: none.  Depression, unchanged.    Plan: 1. Medications: continue Effexor. 2. List of counselors provided. 3. Instructed patient to contact office or on-call physician promptly should condition worsen or any new symptoms appear. IF THE PATIENT HAS ANY SUICIDAL OR HOMICIDAL IDEATIONS, CALL THE OFFICE, DISCUSS WITH A SUPPORT MEMBER, OR GO TO THE ER IMMEDIATELY. Patient was agreeable with this plan.  Chronic pain syndrome Chronic Pain, established problem, stable. 4. Current Medications: reviewed. 5. Side Effects: none 6. Controlled substance database reviewed and appropriate: Yes  ROS: No constipation, sedation, falls, or confusion.  Plan: 1. Pt warned that strong pain medication has side effects.  Watch for sedation and any alteration in mentation. Pt to stop medication if having inappropriate side effects. Also, all pain medications can have habit forming qualities: patient is aware of this information.  Orders Placed This Encounter  Procedures  . Lipid panel  . Hemoglobin A1c   Meds ordered this encounter  Medications  . cyanocobalamin ((VITAMIN B-12)) injection 1,000 mcg    . Reviewed expectations re: course of current medical issues. . Discussed self-management of symptoms. . Outlined signs and symptoms indicating need for more acute intervention. . Patient verbalized understanding and all questions were  answered. Marland Kitchen Health Maintenance issues including appropriate healthy diet, exercise, and smoking avoidance were discussed with patient. . See orders for this visit as documented in the electronic medical record. . Patient received an After Visit Summary.  Helane Rima, DO [place], [place] 12/09/2017

## 2017-12-06 ENCOUNTER — Encounter: Payer: Self-pay | Admitting: Family Medicine

## 2017-12-07 DIAGNOSIS — Z0279 Encounter for issue of other medical certificate: Secondary | ICD-10-CM

## 2017-12-09 ENCOUNTER — Encounter: Payer: Self-pay | Admitting: Family Medicine

## 2017-12-09 DIAGNOSIS — E538 Deficiency of other specified B group vitamins: Secondary | ICD-10-CM | POA: Insufficient documentation

## 2017-12-09 DIAGNOSIS — E782 Mixed hyperlipidemia: Secondary | ICD-10-CM | POA: Insufficient documentation

## 2017-12-09 NOTE — Assessment & Plan Note (Signed)
Chronic Pain, established problem, stable. 1. Current Medications: reviewed. 2. Side Effects: none 3. Controlled substance database reviewed and appropriate: Yes  ROS: No constipation, sedation, falls, or confusion.  Plan: 1. Pt warned that strong pain medication has side effects.  Watch for sedation and any alteration in mentation. Pt to stop medication if having inappropriate side effects. Also, all pain medications can have habit forming qualities: patient is aware of this information.

## 2017-12-09 NOTE — Assessment & Plan Note (Addendum)
Current symptoms include depressed mood and hopelessness. Symptoms have been unchanged since that time. Patient denies recurrent thoughts of death and suicidal thoughts with specific plan. Previous treatment includes: medication. He complains of the following side effects from the treatment: none.  Depression, unchanged.    Plan: 1. Medications: continue Effexor. 2. List of counselors provided. 3. Instructed patient to contact office or on-call physician promptly should condition worsen or any new symptoms appear. IF THE PATIENT HAS ANY SUICIDAL OR HOMICIDAL IDEATIONS, CALL THE OFFICE, DISCUSS WITH A SUPPORT MEMBER, OR GO TO THE ER IMMEDIATELY. Patient was agreeable with this plan.

## 2017-12-09 NOTE — Assessment & Plan Note (Signed)
Lab Results  Component Value Date   VITAMINB12 229 09/01/2017   Patient currently taking Metformin, which will decrease B12. Has polyneuropathy so need to replace to see if it improves. Should take 1000 mcg daily orally.

## 2017-12-09 NOTE — Assessment & Plan Note (Signed)
Medication compliance: compliant all of the time, diabetic diet compliance: noncompliant some of the time, home glucose monitoring: is not performed, further diabetic ROS: no polyuria or polydipsia, no chest pain, dyspnea or TIA's, no hypoglycemia, no medication side effects noted.  Diabetes Health Maintenance Due  Topic Date Due  . OPHTHALMOLOGY EXAM  02/21/2018  . HEMOGLOBIN A1C  06/05/2018  . FOOT EXAM  07/26/2018  . URINE MICROALBUMIN  09/02/2018   Diabetes QM Metrics Latest Ref Rng & Units 12/05/2017 09/01/2017 02/21/2017  HbA1c 4.6 - 6.5 % 7.2(H) 8.1(H) -  Microalb 0.0 - 1.9 mg/dL - 1.0 -  Micro/Creat Ratio 0.0 - 30.0 mg/g - 0.5 -  LDL Cacl 0 - 99 mg/dL 960(A) - -  Eye Exam No Retinopathy - - No Retinopathy  Some recent data might be hidden   BP& BMI  12/05/2017 11/21/2017 09/01/2017 07/25/2017 06/14/2017  BP 128/76 120/78 118/82 132/84 122/84  BMI 40.03 39.31 39.66 39.75 40.06  Some recent data might be hidden    Plan: 1.  Patient is counseled on appropriate foot care. 2.  BP goal < 130/80. 3.  LDL goal of < 100, HDL > 40 and TG < 150.  4.  Eye Exam yearly and Dental Exam every 6 months. 5.  Dietary recommendations: < 100 g carbohydrates daily. 6.  Physical Activity recommendations: as tolerated. 7.  Pneumovax at diagnosis and once 65+. Wait five years between first dose and dose after 65.  8.  Influenza annually.

## 2017-12-09 NOTE — Assessment & Plan Note (Signed)
Is the patient taking medications without problems? No, not regularly. Trying to exercise on a regular basis? No. Compliant with diet? No.  Lipid Panel  Lab Results  Component Value Date   CHOL 262 (H) 12/05/2017   HDL 52.00 12/05/2017   LDLCALC 182 (H) 12/05/2017   LDLDIRECT 148.1 11/04/2010   TRIG 141.0 12/05/2017   CHOLHDL 5 12/05/2017   Lab Results  Component Value Date   ALT 12 09/01/2017   AST 11 09/01/2017   ALKPHOS 57 09/01/2017   BILITOT 0.4 09/01/2017   Dyslipidemia under poor control.  Plan: 1. Continue dietary measures. 2. Continue regular exercise, an average 40 minutes of moderate to vigorous-intensity aerobic activity 3 or 4 times per week. 3. Lipid-lowering medications: restart statin.

## 2017-12-12 ENCOUNTER — Other Ambulatory Visit: Payer: Self-pay | Admitting: Family Medicine

## 2017-12-12 DIAGNOSIS — E1140 Type 2 diabetes mellitus with diabetic neuropathy, unspecified: Secondary | ICD-10-CM

## 2017-12-14 ENCOUNTER — Other Ambulatory Visit: Payer: Self-pay

## 2017-12-14 DIAGNOSIS — E1140 Type 2 diabetes mellitus with diabetic neuropathy, unspecified: Secondary | ICD-10-CM

## 2017-12-14 MED ORDER — DULAGLUTIDE 0.75 MG/0.5ML ~~LOC~~ SOAJ: 1.5000 mg | SUBCUTANEOUS | 1 refills | Status: DC

## 2018-01-02 DIAGNOSIS — M545 Low back pain: Secondary | ICD-10-CM | POA: Diagnosis not present

## 2018-01-03 ENCOUNTER — Encounter: Payer: Self-pay | Admitting: Family Medicine

## 2018-01-03 DIAGNOSIS — G8929 Other chronic pain: Secondary | ICD-10-CM

## 2018-01-03 DIAGNOSIS — M5442 Lumbago with sciatica, left side: Principal | ICD-10-CM

## 2018-01-03 DIAGNOSIS — M5441 Lumbago with sciatica, right side: Principal | ICD-10-CM

## 2018-01-04 NOTE — Telephone Encounter (Signed)
Last o/v 12/05/2017  F/u 03/07/18 Last script percocet 10/325 #20 no rf on 11/21/17

## 2018-01-05 MED ORDER — OXYCODONE-ACETAMINOPHEN 10-325 MG PO TABS: 1.0000 | ORAL_TABLET | Freq: Four times a day (QID) | ORAL | 0 refills | Status: DC | PRN

## 2018-01-22 DIAGNOSIS — G4731 Primary central sleep apnea: Secondary | ICD-10-CM | POA: Diagnosis not present

## 2018-01-22 DIAGNOSIS — G4733 Obstructive sleep apnea (adult) (pediatric): Secondary | ICD-10-CM | POA: Diagnosis not present

## 2018-02-14 ENCOUNTER — Telehealth: Payer: Self-pay

## 2018-02-14 ENCOUNTER — Other Ambulatory Visit: Payer: Self-pay

## 2018-02-14 DIAGNOSIS — E1140 Type 2 diabetes mellitus with diabetic neuropathy, unspecified: Secondary | ICD-10-CM

## 2018-02-14 MED ORDER — DULAGLUTIDE 1.5 MG/0.5ML ~~LOC~~ SOAJ: 1.5000 mg | SUBCUTANEOUS | 2 refills | Status: DC

## 2018-02-14 NOTE — Telephone Encounter (Signed)
Refill request received from pharmacy for Trulicity 0.75 mg/mL. Per OV note 12/05/2017, start with 0.75mg /mL then increase to 1.5 mg/mL if tolerating well.   Spoke with pt, he reports that he has been tolerating Trulicity well.   OK to send in new Rx for Trulicity 1.5mg /mL, inject 1.5 mg into the skin once weekly?  F/u visit scheduled for 03/07/2018.

## 2018-02-14 NOTE — Telephone Encounter (Signed)
New rx sent in for Trulicity 1.5 mg/mL, inj 1.5 mg once weekly.   Called pt and advised.

## 2018-02-14 NOTE — Telephone Encounter (Signed)
Yes, please.

## 2018-02-14 NOTE — Addendum Note (Signed)
Addended by: Dierdre Searles on: 02/14/2018 09:57 AM   Modules accepted: Orders

## 2018-02-20 ENCOUNTER — Telehealth: Payer: Self-pay

## 2018-02-20 NOTE — Telephone Encounter (Signed)
PA for Trulicity 1.5 mg/0.28mL KEY [id number]  Initiated via [email or web address]  [name] (Key: [id number])   Your information has been sent to [facility].

## 2018-02-20 NOTE — Telephone Encounter (Signed)
[name] ([name])   This request has received a Favorable outcome. Please note any additional information provided by [facility] at the bottom of your screen. You will also receive a faxed copy of the determination.

## 2018-02-20 NOTE — Telephone Encounter (Signed)
[name] (Key: [id number])  Trulicity 1.5MG /0.5ML pen-injectors  Form [facility] Electronic Prior Authorization Form (2017 NCPDP)  Created  38 minutes ago  Sent to Plan  6 minutes ago  Plan Response  5 minutes ago  Submit Clinical Questions  less than a minute ago  Determination    Wait for Determination Please wait for [facility] 2017 NCPDP to return a determination.  ? Prescriber Instructions  ? Patient  ? Drug Requested  ? Provider  ? Prescriber Next Steps  ? Prior Authorization Request Evaluation Questions  ? Upload Pertinent Records    Pending approval

## 2018-02-24 IMAGING — CT CT HEAD W/O CM
3 of 4 series · 17 of 47 positions shown, 20 images · non-contrast
Comparison: None.

CLINICAL DATA: Evaluate for headache, delete

EXAM:
CT HEAD WITHOUT CONTRAST
TECHNIQUE: Contiguous axial images were obtained from the base of the skull
through the vertex without intravenous contrast.

[Series 201: head w/o, idose (1) · axial · non-contrast · 0.49mm/px · z∈[+146,+281]mm · 11 of 33 slices shown, 14 images]
[im 3/33  brain]
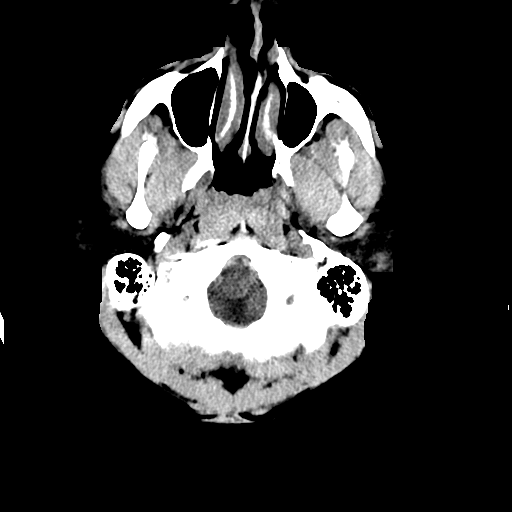
[im 3/33  bone]
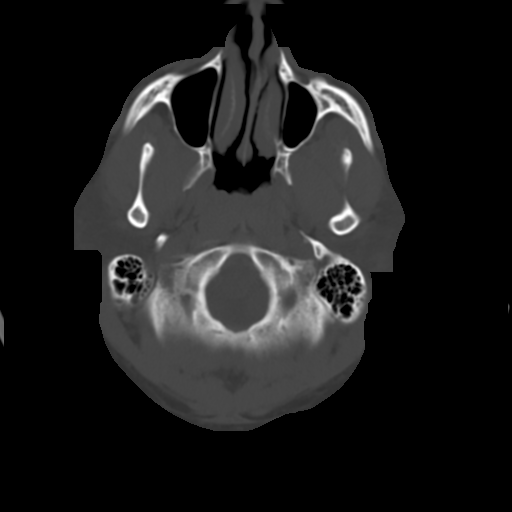
[im 5/33  brain]
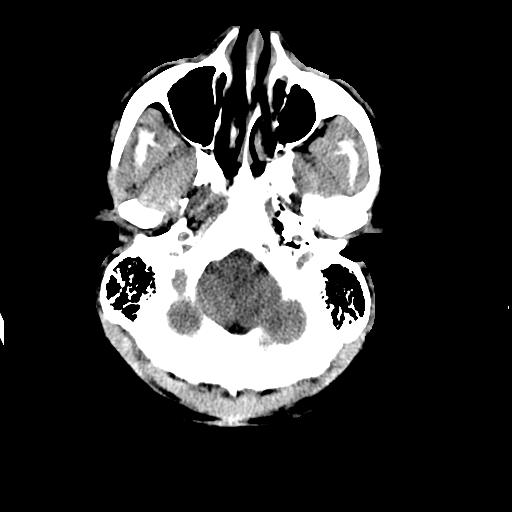
[im 7/33  brain]
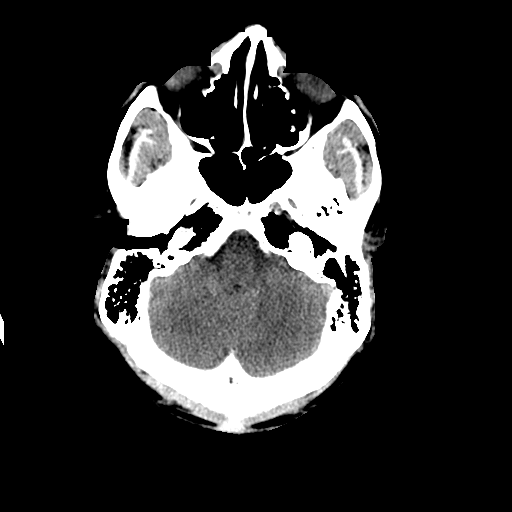
[im 12/33  brain]
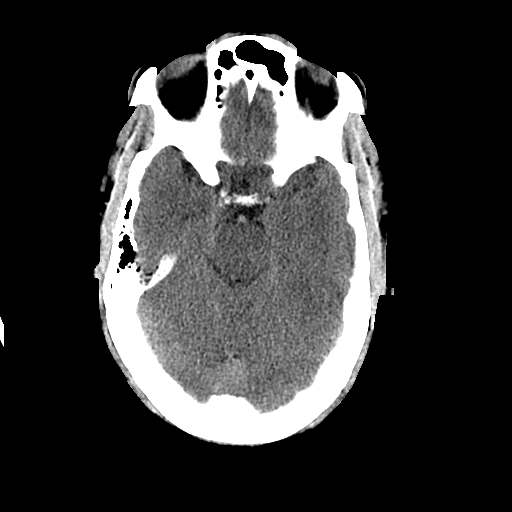
[im 14/33  brain]
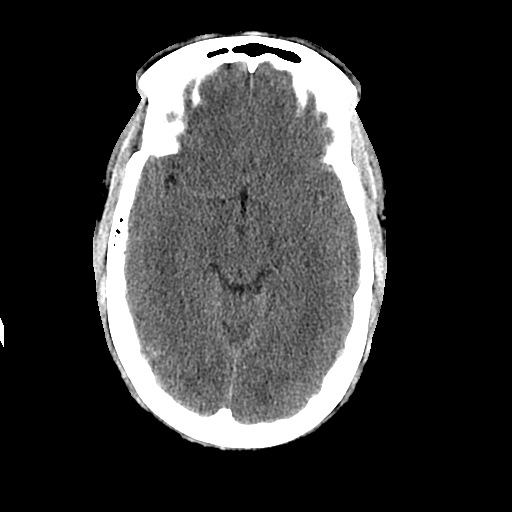
[im 14/33  bone]
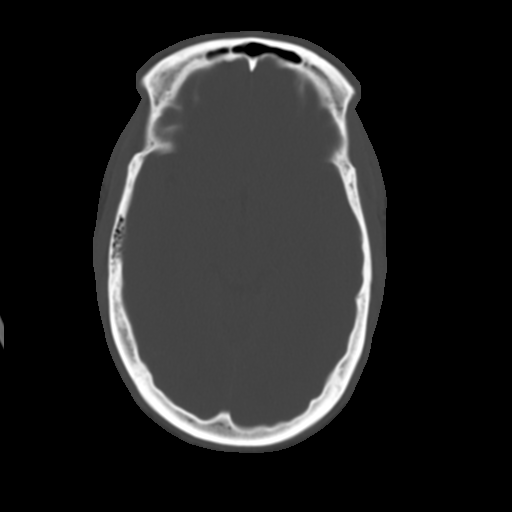
[im 17/33  brain]
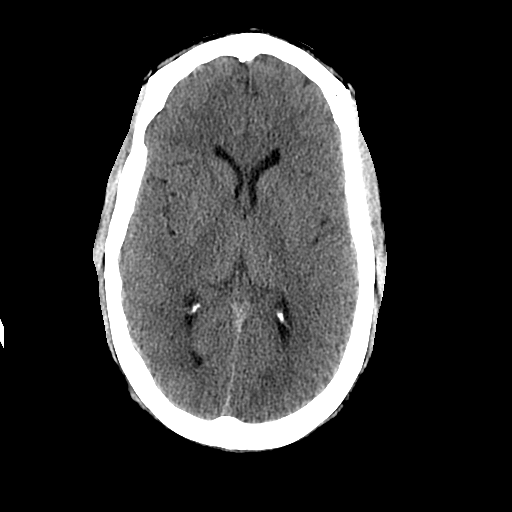
[im 19/33  brain]
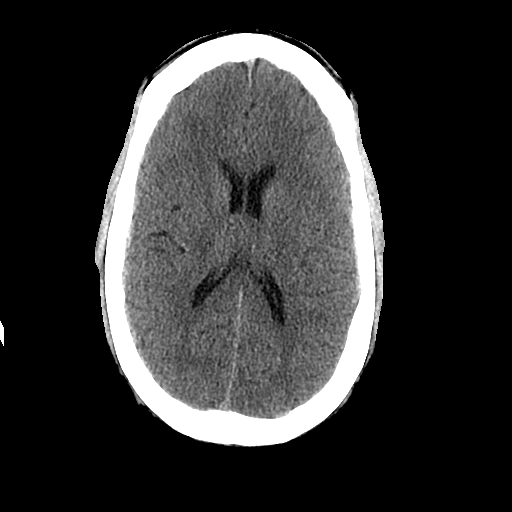
[im 21/33  brain]
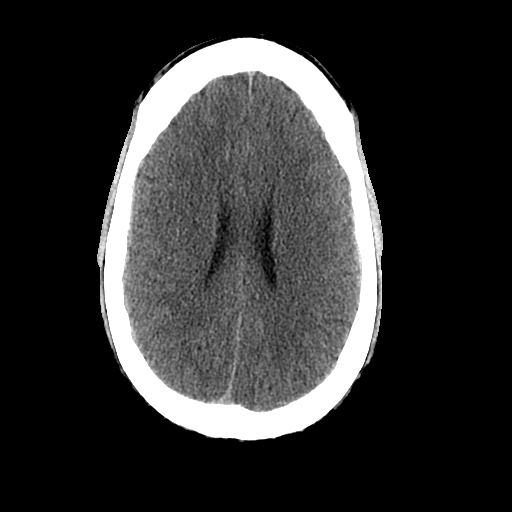
[im 26/33  brain]
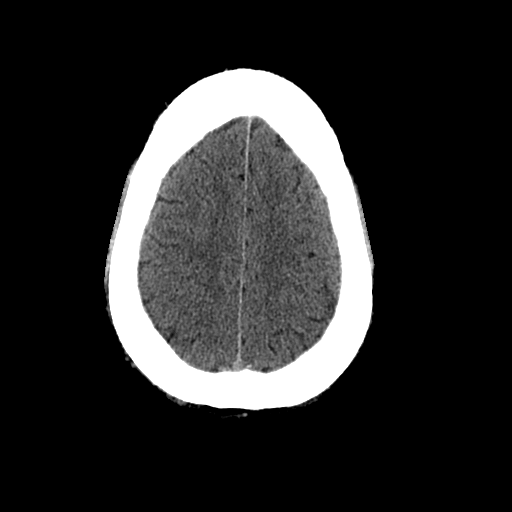
[im 26/33  bone]
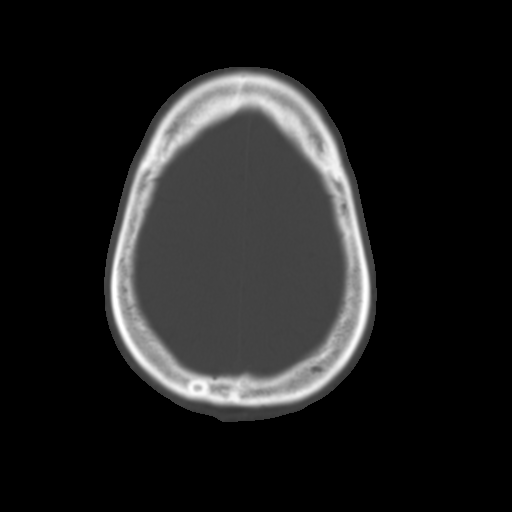
[im 28/33  brain]
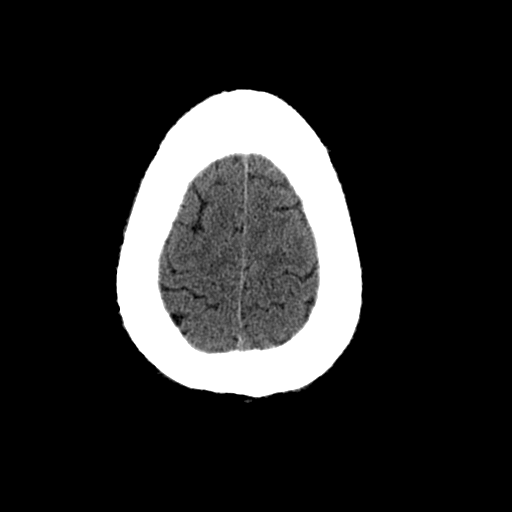
[im 30/33  brain]
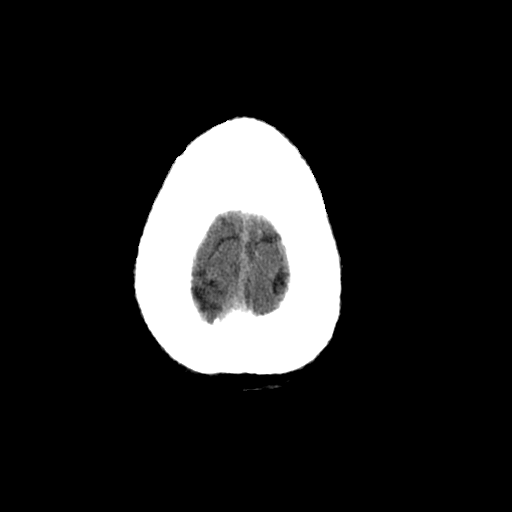

[Series 203: coronal st, idose (1) · coronal · 0.40mm/px · 3 of 83 slices shown]
[im 28/83  brain]
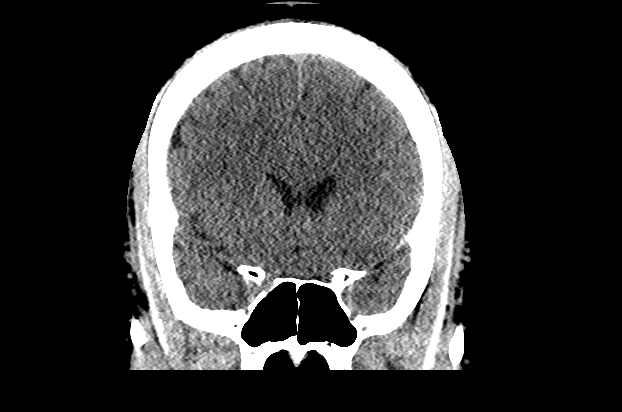
[im 37/83  brain]
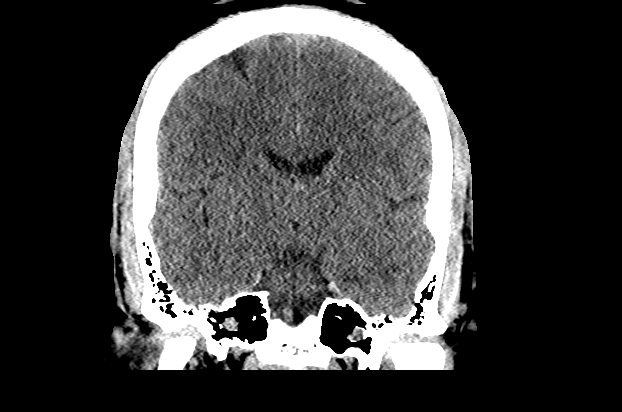
[im 46/83  brain]
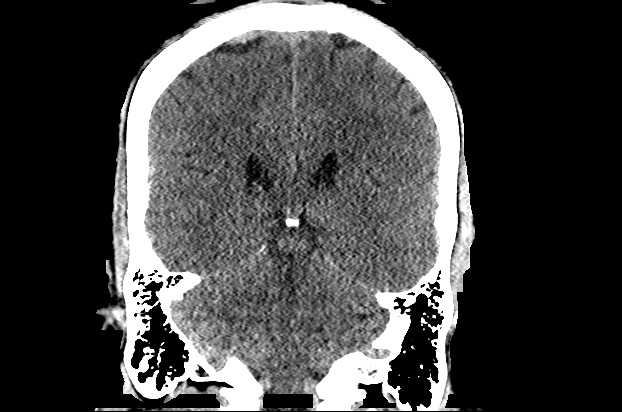

[Series 204: sagittal st, idose (1) · sagittal · 0.40mm/px · 3 of 83 slices shown]
[im 28/83  brain]
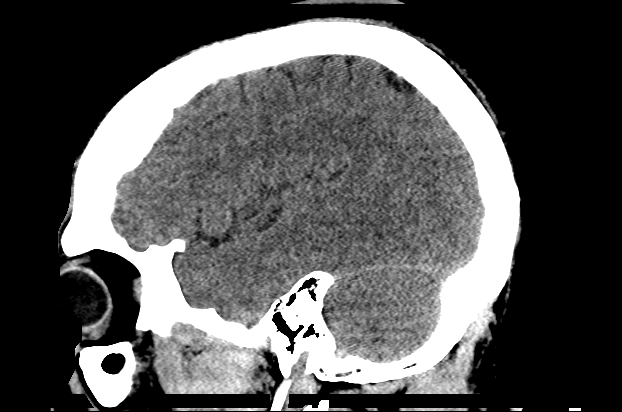
[im 42/83  brain]
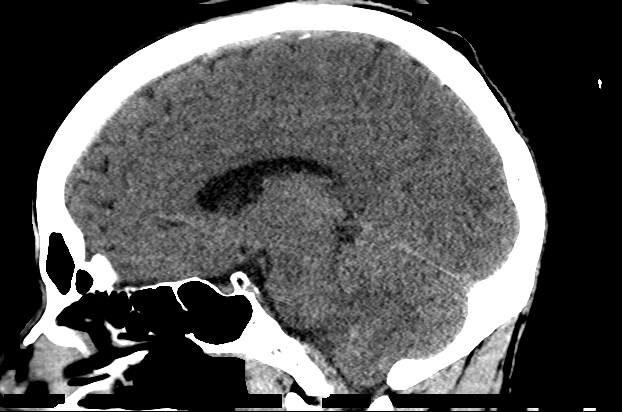
[im 55/83  brain]
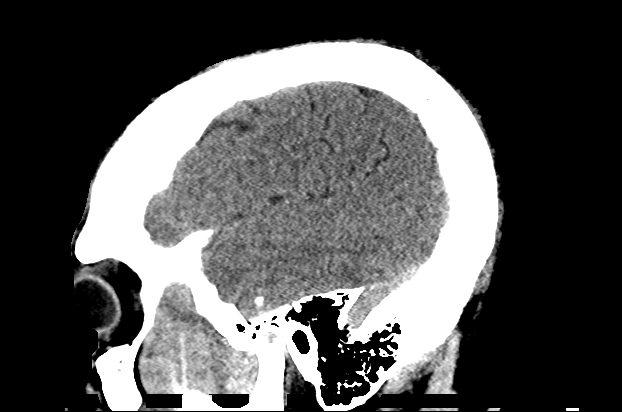

[17 of 47 positions shown; findings below may reference images not displayed]

FINDINGS: Brain: No intracranial hemorrhage, mass effect or midline shift. No
acute cortical infarction. No mass lesion is noted on this
unenhanced scan. The gray and white-matter differentiation is
preserved. No hydrocephalus. No intra or extra-axial fluid
collection.

Vascular: No hyperdense vessel or unexpected calcification.

Skull: Normal. Negative for fracture or focal lesion.

Sinuses/Orbits: No acute finding.

Other: None.
IMPRESSION: No acute intracranial abnormality. No definite acute cortical
infarction.

## 2018-02-24 IMAGING — MR MR HEAD W/O CM
9 of 11 series · 34 of 48 positions shown · non-contrast
Comparison: CT head without contrast from the same day.

CLINICAL DATA: Left upper extremity weakness beginning last
evening. Personal history of CVA and seizure.

EXAM:
MRI HEAD WITHOUT CONTRAST
TECHNIQUE: Multiplanar, multiecho pulse sequences of the brain and surrounding
structures were obtained without intravenous contrast.

[Series 3: DWI · axial · 3.0mm · 0.94mm/px · z∈[-80,+73]mm · 7 of 106 slices shown (1 of 2)]
[im 1/106]
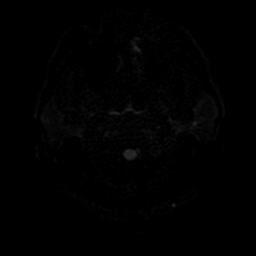
[im 18/106]
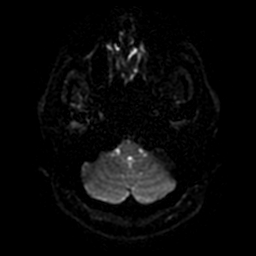
[im 36/106]
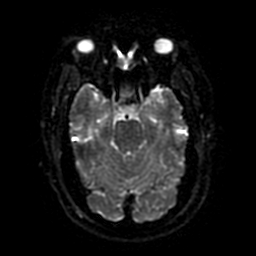
[im 53/106]
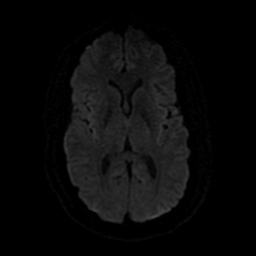
[im 71/106]
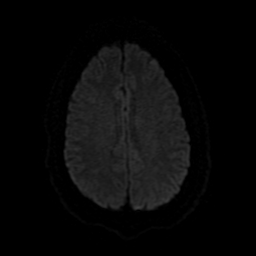
[im 88/106]
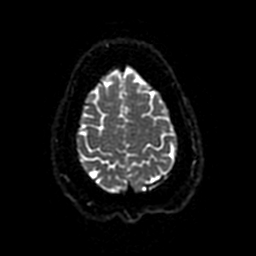
[im 106/106]
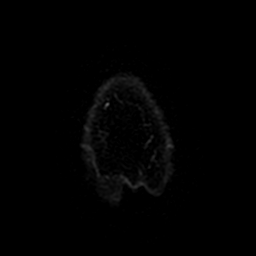

[Series 4: T2 · axial · 5.0mm · 0.47mm/px · z∈[-85,+82]mm · 2 of 29 slices shown (1 of 2)]
[im 1/29]
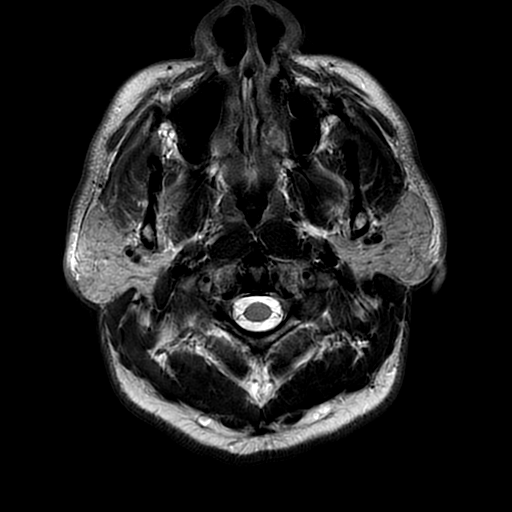
[im 29/29]
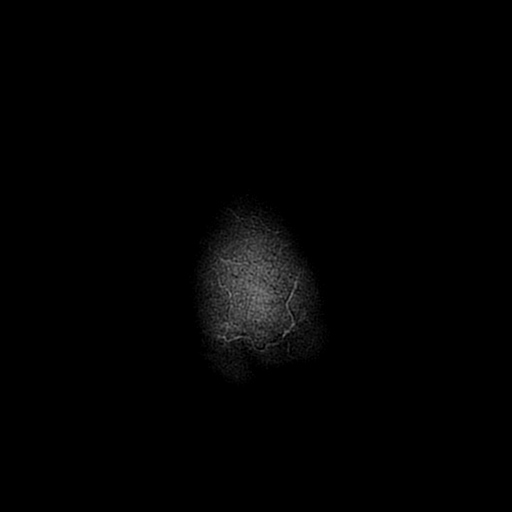

[Series 5: FLAIR · axial · 5.0mm · 0.94mm/px · z∈[-79,+82]mm · 2 of 28 slices shown (1 of 2)]
[im 1/28]
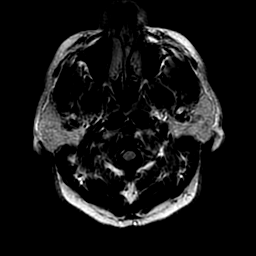
[im 28/28]
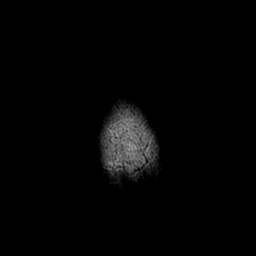

[Series 6: DWI · coronal · 4.0mm · 0.94mm/px · 6 of 72 slices shown (2 of 2)]
[im 1/72]
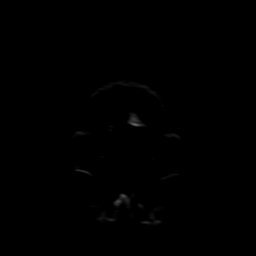
[im 15/72]
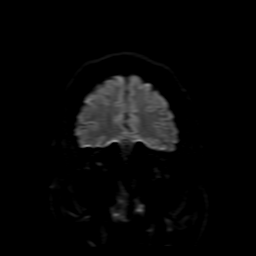
[im 29/72]
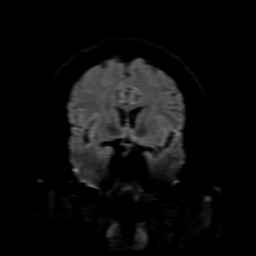
[im 43/72]
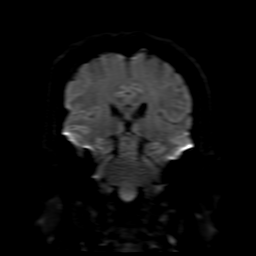
[im 57/72]
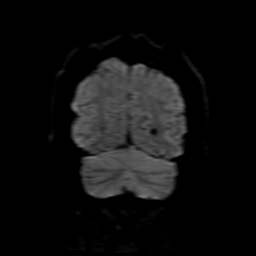
[im 72/72]
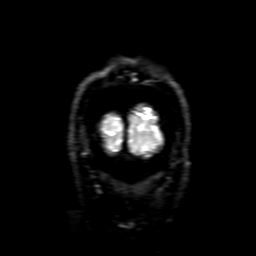

[Series 7: FLAIR · sagittal · 5.0mm · 0.47mm/px · 2 of 23 slices shown (2 of 2)]
[im 1/23]
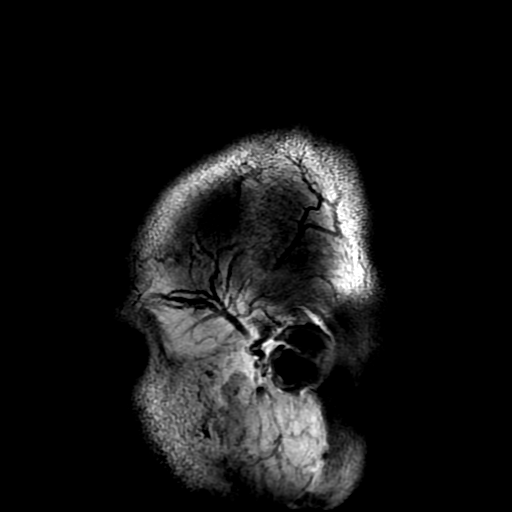
[im 23/23]
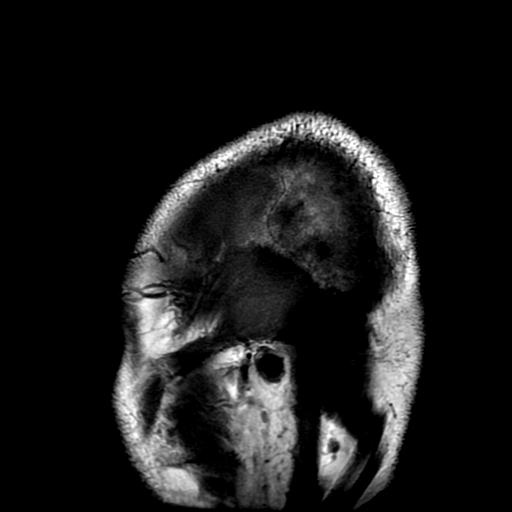

[Series 8: (person_name) · axial · 3.0mm · 0.47mm/px · z∈[-83,+21]mm · 6 of 100 slices shown]
[im 1/100]
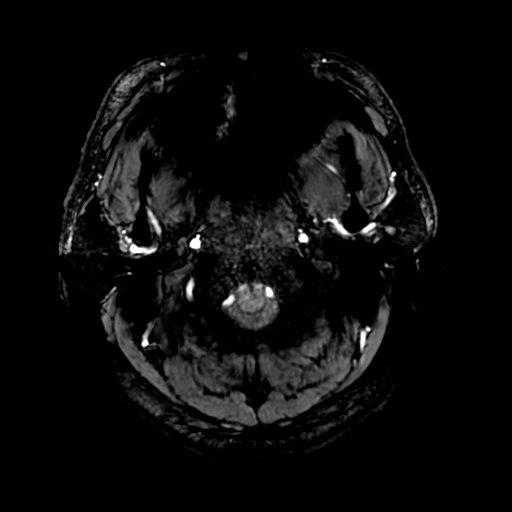
[im 15/100]
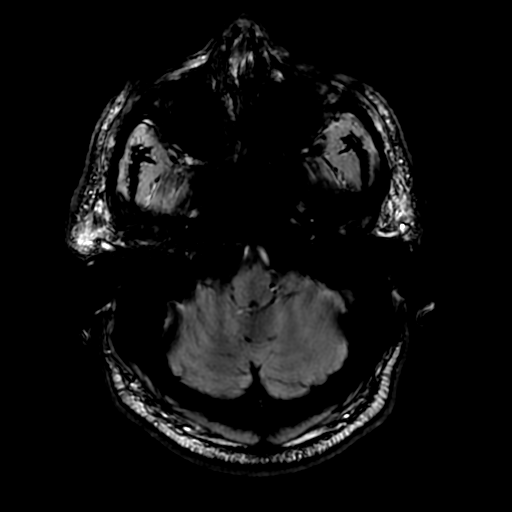
[im 29/100]
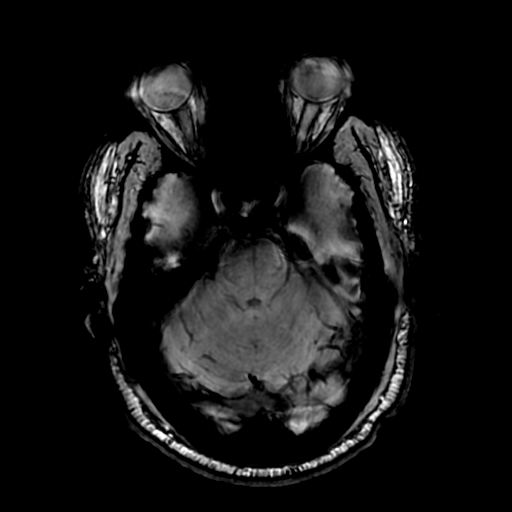
[im 43/100]
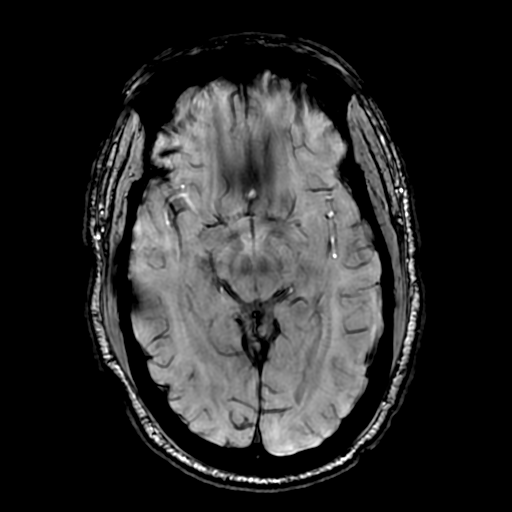
[im 57/100]
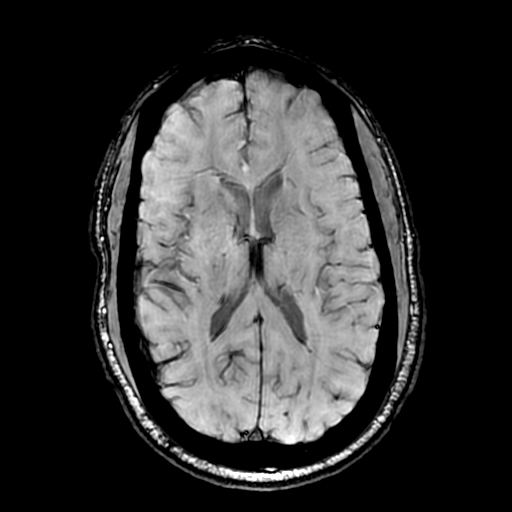
[im 71/100]
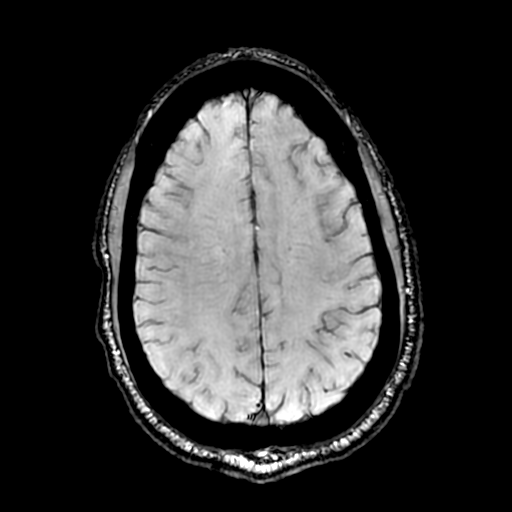

[Series 10: T2 · coronal · 5.0mm · 0.43mm/px · 2 of 30 slices shown (2 of 2)]
[im 1/30]
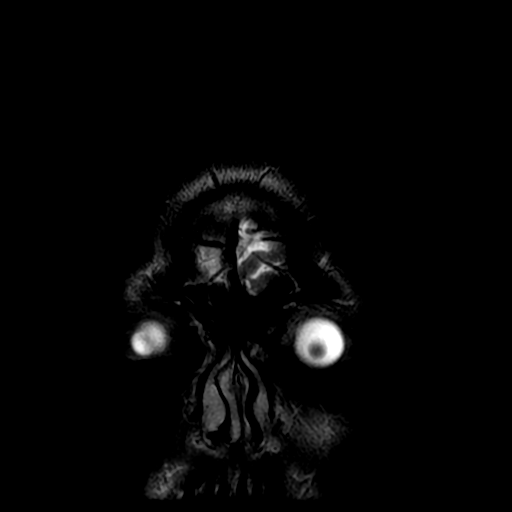
[im 30/30]
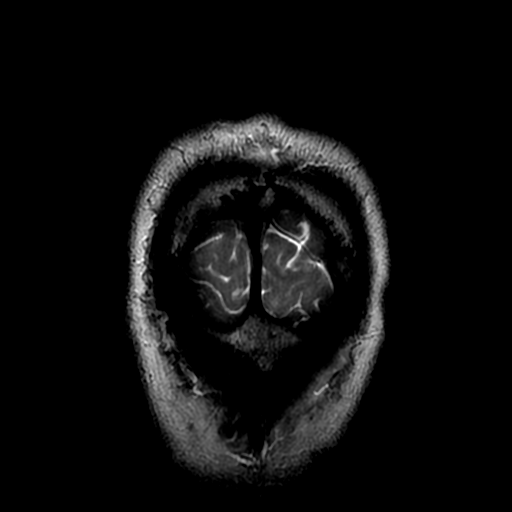

[Series 350: ADC · axial · 3.0mm · 0.94mm/px · z∈[-80,+73]mm · 4 of 52 slices shown (1 of 2)]
[im 1/52]
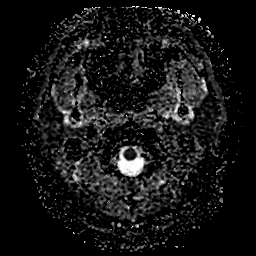
[im 18/52]
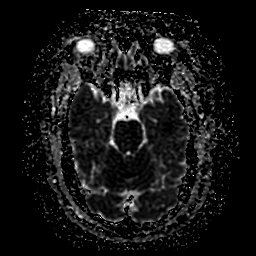
[im 35/52]
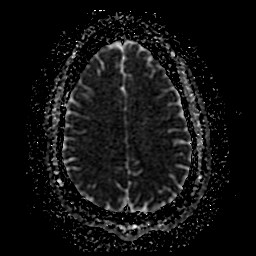
[im 52/52]
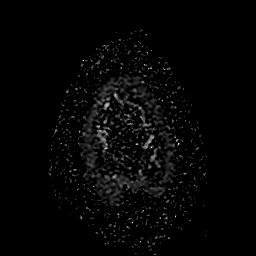

[Series 650: ADC · coronal · 4.0mm · 0.94mm/px · 3 of 36 slices shown (2 of 2)]
[im 1/36]
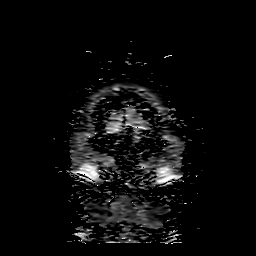
[im 18/36]
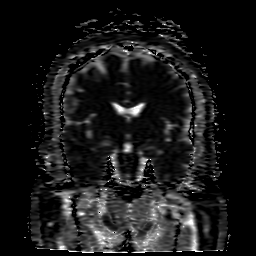
[im 36/36]
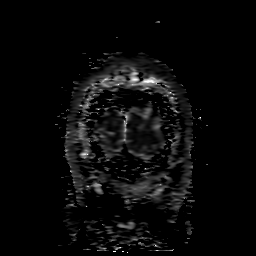

[34 of 48 positions shown; findings below may reference images not displayed]

FINDINGS: Brain: Diffusion-weighted images demonstrate no evidence for acute
or subacute infarction. No acute hemorrhage or mass lesion is
present. The ventricles are normal size. No significant extra-axial
fluid collection is present. There is no significant white matter
disease. The brainstem and cerebellum are normal.

Vascular: Flow is present in the major intracranial arteries.

Skull and upper cervical spine: The skullbase is within normal
limits. The internal auditory canals are normal. Midline sagittal
structures are unremarkable.

Sinuses/Orbits: The paranasal sinuses are clear. There is some fluid
in the posterior left mastoid air cells. No obstructing
nasopharyngeal lesion is present.

Other:
IMPRESSION: Negative MRI of the brain.

## 2018-03-05 ENCOUNTER — Ambulatory Visit: Payer: Self-pay

## 2018-03-05 NOTE — Telephone Encounter (Signed)
Patient called, left VM to return call to discuss symptoms.    Summary: nausea, vomiting, temp   Pts wife calling and states today her husband has been vomiting, having nausea, weakness, body aches, temp of 102, and chills. Scheduled an appt for tomorrow morning. Wife would like advice of how to help her husband tonight. Please advise.

## 2018-03-05 NOTE — Telephone Encounter (Signed)
Left message for pt to return call to office to discuss symptoms.

## 2018-03-06 ENCOUNTER — Ambulatory Visit (INDEPENDENT_AMBULATORY_CARE_PROVIDER_SITE_OTHER): Payer: BLUE CROSS/BLUE SHIELD | Admitting: Family Medicine

## 2018-03-06 ENCOUNTER — Encounter: Payer: Self-pay | Admitting: Family Medicine

## 2018-03-06 VITALS — BP 110/68 | HR 98 | Temp 98.6°F | Ht 70.0 in | Wt 261.0 lb

## 2018-03-06 DIAGNOSIS — R509 Fever, unspecified: Secondary | ICD-10-CM

## 2018-03-06 DIAGNOSIS — K2900 Acute gastritis without bleeding: Secondary | ICD-10-CM

## 2018-03-06 DIAGNOSIS — K529 Noninfective gastroenteritis and colitis, unspecified: Secondary | ICD-10-CM

## 2018-03-06 LAB — GLUCOSE, POCT (MANUAL RESULT ENTRY): POC Glucose: 94 mg/dl (ref 70–99)

## 2018-03-06 LAB — POC INFLUENZA A&B (BINAX/QUICKVUE)
Influenza A, POC: NEGATIVE
Influenza B, POC: NEGATIVE

## 2018-03-06 MED ORDER — OMEPRAZOLE 40 MG PO CPDR: 40.0000 mg | DELAYED_RELEASE_CAPSULE | Freq: Every day | ORAL | 3 refills | Status: DC

## 2018-03-06 MED ORDER — FAMOTIDINE 20 MG PO TABS: 20.0000 mg | ORAL_TABLET | Freq: Two times a day (BID) | ORAL | 0 refills | Status: DC

## 2018-03-06 MED ORDER — ONDANSETRON 4 MG PO TBDP: 4.0000 mg | ORAL_TABLET | Freq: Three times a day (TID) | ORAL | 0 refills | Status: DC | PRN

## 2018-03-06 NOTE — Telephone Encounter (Signed)
Added to note for today

## 2018-03-06 NOTE — Telephone Encounter (Signed)
See note

## 2018-03-06 NOTE — Progress Notes (Signed)
Ky BarbanKeifer Augustin CoupeRomer Petersen is a 48 y.o. male here for an acute visit.  History of Present Illness:   Fever   This is a new problem. The current episode started in the past 7 days. The problem has been gradually improving. The maximum temperature noted was 101 to 101.9 F. The temperature was taken using an oral thermometer. Associated symptoms include abdominal pain, diarrhea, nausea and vomiting. Pertinent negatives include no chest pain, coughing, headaches, rash, sore throat or urinary pain. He has tried NSAIDs and fluids for the symptoms. The treatment provided no relief.  Risk factors: sick contacts   Risk factors: no contaminated food, no occupational exposure, no recent sickness and no recent travel     PMHx, SurgHx, SocialHx, Medications, and Allergies were reviewed in the Visit Navigator and updated as appropriate.  Current Medications   .  acetaminophen (TYLENOL) 500 MG tablet, Take 1,000 mg by mouth as needed., Disp: , Rfl:  .  aspirin EC 81 MG tablet, Take 1 tablet (81 mg total) by mouth daily., Disp: 30 tablet, Rfl: 0 .  cetirizine (ZYRTEC) 10 MG tablet, Take 10 mg by mouth every other day., Disp: , Rfl:  .  cyclobenzaprine (FLEXERIL) 10 MG tablet, Take 10 mg by mouth 3 (three) times daily., Disp: , Rfl:  .  diclofenac (VOLTAREN) 75 MG EC tablet, Take 75 mg by mouth 2 (two) times daily., Disp: , Rfl:  .  diclofenac sodium (VOLTAREN) 1 % GEL, Apply 2 g topically 2 (two) times daily., Disp: , Rfl:  .  Dulaglutide (TRULICITY) 1.5 MG/0.5ML SOPN, Inject 1.5 mg into the skin once a week., Disp: 4 pen, Rfl: 2 .  fluticasone (FLONASE) 50 MCG/ACT nasal spray, Place 2 sprays into both nostrils daily., Disp: , Rfl:  .  hydrOXYzine (ATARAX/VISTARIL) 25 MG tablet, TAKE 1 TABLET BY MOUTH AT BEDTIME AS NEEDED FOR ANXIETY ., Disp: 30 tablet, Rfl: 1 .  MENTHOL-METHYL SALICYLATE EX, Apply 10-15 % topically 3 (three) times daily., Disp: , Rfl:  .  metFORMIN (GLUCOPHAGE) 500 MG tablet, TAKE 2 TABLETS  BY MOUTH TWICE A DAY WITH A MEAL, Disp: 360 tablet, Rfl: 0 .  oxyCODONE-acetaminophen (PERCOCET) 10-325 MG tablet, Take 1 tablet by mouth every 6 (six) hours as needed for pain., Disp: 120 tablet, Rfl: 0 .  rosuvastatin (CRESTOR) 20 MG tablet, TAKE 1 TABLET BY MOUTH EVERY DAY, Disp: 90 tablet, Rfl: 2 .  sildenafil (VIAGRA) 100 MG tablet, Take 100 mg by mouth as needed for erectile dysfunction (Take one-half tablet by mouth as instructed (take 1 hour prior to sexual activity *do not exceed 1 dose per 24 hour period*))., Disp: , Rfl:  .  traZODone (DESYREL) 100 MG tablet, Take 100 mg by mouth at bedtime as needed for sleep. , Disp: , Rfl: 0 .  triamcinolone (KENALOG) 0.025 % ointment, Apply 1 application topically 2 (two) times daily., Disp: 454 g, Rfl: 0 .  venlafaxine XR (EFFEXOR-XR) 150 MG 24 hr capsule, Take 1 capsule by mouth daily., Disp: , Rfl:  .  verapamil (CALAN) 80 MG tablet, , Disp: , Rfl:    Allergies  Allergen Reactions  . Atorvastatin Swelling  . Tizanidine Hives  . Cymbalta [Duloxetine Hcl]     Stomach upset, difficulty urinating, decreased libido.   Review of Systems   Pertinent items are noted in the HPI. Otherwise, ROS is negative.  Vitals   Vitals:   03/06/18 1038  BP: 110/68  Pulse: 98  Temp: 98.6 F (37 C)  TempSrc: Oral  SpO2: 97%  Weight: 261 lb (118.4 kg)  Height: 5\' 10"  (1.778 m)     Body mass index is 37.45 kg/m.  Physical Exam   Physical Exam Constitutional:      Appearance: He is well-developed. He is ill-appearing. He is not diaphoretic.  HENT:     Head: Normocephalic and atraumatic.     Right Ear: Tympanic membrane and external ear normal.     Left Ear: Tympanic membrane and external ear normal.     Nose: Nose normal.     Mouth/Throat:     Mouth: Mucous membranes are moist.  Eyes:     Conjunctiva/sclera: Conjunctivae normal.  Neck:     Musculoskeletal: Normal range of motion.  Cardiovascular:     Rate and Rhythm: Normal rate and  regular rhythm.  Pulmonary:     Effort: Pulmonary effort is normal. No respiratory distress.  Abdominal:     General: Bowel sounds are normal.     Palpations: Abdomen is soft.     Tenderness: There is abdominal tenderness in the epigastric area.  Musculoskeletal: Normal range of motion.        General: No deformity.  Skin:    General: Skin is warm.  Neurological:     Mental Status: He is alert and oriented to person, place, and time.  Psychiatric:        Behavior: Behavior normal.    Assessment and Plan   Wake was seen today for fever.  Diagnoses and all orders for this visit:  Fever and chills -     POC Influenza A&B(BINAX/QUICKVUE) -     POCT Glucose (CBG)  Gastroenteritis -     ondansetron (ZOFRAN ODT) 4 MG disintegrating tablet; Take 1 tablet (4 mg total) by mouth every 8 (eight) hours as needed for nausea or vomiting.  Acute gastritis without hemorrhage, unspecified gastritis type -     omeprazole (PRILOSEC) 40 MG capsule; Take 1 capsule (40 mg total) by mouth daily. -     famotidine (PEPCID) 20 MG tablet; Take 1 tablet (20 mg total) by mouth 2 (two) times daily.    . Reviewed expectations re: course of current medical issues. . Discussed self-management of symptoms. . Outlined signs and symptoms indicating need for more acute intervention. . Patient verbalized understanding and all questions were answered. Marland Kitchen Health Maintenance issues including appropriate healthy diet, exercise, and smoking avoidance were discussed with patient. . See orders for this visit as documented in the electronic medical record. . Patient received an After Visit Summary.  Helane Rima, DO [place], [facility] 03/09/2018

## 2018-03-07 ENCOUNTER — Ambulatory Visit: Payer: 59 | Admitting: Family Medicine

## 2018-03-23 ENCOUNTER — Telehealth: Payer: Self-pay | Admitting: Family Medicine

## 2018-03-23 NOTE — Telephone Encounter (Signed)
See request °

## 2018-03-23 NOTE — Telephone Encounter (Signed)
Copied from CRM [id number]. Topic: Quick Communication - See Telephone Encounter >> Mar 23, 2018  3:29 PM Angela Nevin wrote: CRM for notification. See Telephone encounter for: 03/23/18.  Patient states that the medication Dulaglutide (TRULICITY) 1.5 MG/0.5ML SOPN is too expensive (600$) and inquired if Dr. Earlene Plater knew of any coupons or could advise him of a more cost effective. Please advise.

## 2018-03-26 NOTE — Telephone Encounter (Signed)
Please offer coupons. I think that we have one month free cards. Ask him to call insurance to see if Ozempic or Victoza covered.

## 2018-03-26 NOTE — Telephone Encounter (Signed)
Please advise 

## 2018-03-27 LAB — HM DIABETES EYE EXAM

## 2018-03-28 ENCOUNTER — Encounter: Payer: Self-pay | Admitting: Family Medicine

## 2018-03-28 ENCOUNTER — Ambulatory Visit (INDEPENDENT_AMBULATORY_CARE_PROVIDER_SITE_OTHER): Payer: BLUE CROSS/BLUE SHIELD | Admitting: Family Medicine

## 2018-03-28 VITALS — BP 110/66 | HR 87 | Temp 98.6°F | Ht 70.0 in | Wt 258.0 lb

## 2018-03-28 DIAGNOSIS — F339 Major depressive disorder, recurrent, unspecified: Secondary | ICD-10-CM

## 2018-03-28 DIAGNOSIS — E1142 Type 2 diabetes mellitus with diabetic polyneuropathy: Secondary | ICD-10-CM

## 2018-03-28 DIAGNOSIS — R5383 Other fatigue: Secondary | ICD-10-CM

## 2018-03-28 DIAGNOSIS — E1169 Type 2 diabetes mellitus with other specified complication: Secondary | ICD-10-CM

## 2018-03-28 DIAGNOSIS — G894 Chronic pain syndrome: Secondary | ICD-10-CM

## 2018-03-28 DIAGNOSIS — G4733 Obstructive sleep apnea (adult) (pediatric): Secondary | ICD-10-CM

## 2018-03-28 DIAGNOSIS — G4701 Insomnia due to medical condition: Secondary | ICD-10-CM

## 2018-03-28 DIAGNOSIS — E785 Hyperlipidemia, unspecified: Secondary | ICD-10-CM

## 2018-03-28 NOTE — Patient Instructions (Addendum)
Call your insurance and see if they cover Ozempic or Victoza

## 2018-03-28 NOTE — Telephone Encounter (Signed)
Patient was in the office for follow up reviewed all information with him.

## 2018-03-28 NOTE — Progress Notes (Signed)
[name] is a 48 y.o. male is here for follow up.  History of Present Illness:    HPI: Please see assessment and plan for problem based charting of issues today.  Health Maintenance Due  Topic Date Due  . OPHTHALMOLOGY EXAM  02/21/2018   Depression screen [facility] 2/9 03/28/2018 09/01/2017 05/30/2017  Decreased Interest '1 3 3  ' Down, Depressed, Hopeless '1 2 3  ' PHQ - 2 Score '2 5 6  ' Altered sleeping '1 3 3  ' Tired, decreased energy '1 3 3  ' Change in appetite '1 3 2  ' Feeling bad or failure about yourself  0 1 1  Trouble concentrating '1 3 3  ' Moving slowly or fidgety/restless '1 3 2  ' Suicidal thoughts 0 0 1  PHQ-9 Score '7 21 21  ' Difficult doing work/chores Somewhat difficult Very difficult -   PMHx, SurgHx, SocialHx, FamHx, Medications, and Allergies were reviewed in the Visit Navigator and updated as appropriate.   Patient Active Problem List   Diagnosis Date Noted  . Morbid obesity ([place]) 03/29/2018  . Vitamin B 12 deficiency 12/09/2017  . Inflammatory spondylopathy of lumbar region ([place]) 09/02/2017  . Chronic post-traumatic stress disorder (PTSD) after military combat 06/03/2017  . Chronic right shoulder pain 06/03/2017  . Depression, recurrent ([place]) 01/01/2017  . Chronic pain syndrome 09/03/2016  . Insomnia 07/15/2015  . Trigger thumb, right thumb 05/29/2015  . Carpal tunnel syndrome on right 02/06/2015  . Cervical spondylosis without myelopathy 02/06/2015  . BPH (benign prostatic hypertrophy) 11/15/2012  . Lumbar facet arthropathy 08/02/2011  . Lumbar spondylosis 08/02/2011  . Type 2 diabetes mellitus with diabetic polyneuropathy, without long-term current use of insulin ([place]) 12/22/2010  . Chronic pain of both knees 12/08/2008  . Hyperlipidemia associated with type 2 diabetes mellitus ([place]), on statin 08/23/2006  . Gout 08/23/2006  . Obstructive sleep apnea 08/23/2006  . Allergic rhinitis 08/23/2006  . Chronic low back pain 08/23/2006  . Seizure disorder ([place])  08/23/2006  . History of CVA (cerebrovascular accident) 08/23/2006   Social History   Tobacco Use  . Smoking status: Former Smoker    Years: 4.00    Types: Cigarettes    Last attempt to quit: 12/22/1990    Years since quitting: 27.2  . Smokeless tobacco: Never Used  . Tobacco comment: 1-2 packs a week  Substance Use Topics  . Alcohol use: No    Alcohol/week: 0.0 standard drinks  . Drug use: No   Current Medications and Allergies   Current Outpatient Medications:  .  acetaminophen (TYLENOL) 500 MG tablet, Take 1,000 mg by mouth as needed., Disp: , Rfl:  .  aspirin EC 81 MG tablet, Take 1 tablet (81 mg total) by mouth daily., Disp: 30 tablet, Rfl: 0 .  cetirizine (ZYRTEC) 10 MG tablet, Take 10 mg by mouth every other day., Disp: , Rfl:  .  cyclobenzaprine (FLEXERIL) 10 MG tablet, Take 10 mg by mouth 3 (three) times daily., Disp: , Rfl:  .  diclofenac (VOLTAREN) 75 MG EC tablet, Take 75 mg by mouth 2 (two) times daily., Disp: , Rfl:  .  diclofenac sodium (VOLTAREN) 1 % GEL, Apply 2 g topically 2 (two) times daily., Disp: , Rfl:  .  Dulaglutide (TRULICITY) 1.5 XF/8.1WE SOPN, Inject 1.5 mg into the skin once a week., Disp: 4 pen, Rfl: 2 .  famotidine (PEPCID) 20 MG tablet, Take 1 tablet (20 mg total) by mouth 2 (two) times daily., Disp: 30 tablet, Rfl: 0 .  fluticasone (FLONASE)  50 MCG/ACT nasal spray, Place 2 sprays into both nostrils daily., Disp: , Rfl:  .  hydrOXYzine (ATARAX/VISTARIL) 25 MG tablet, TAKE 1 TABLET BY MOUTH AT BEDTIME AS NEEDED FOR ANXIETY ., Disp: 30 tablet, Rfl: 1 .  MENTHOL-METHYL SALICYLATE EX, Apply 72-53 % topically 3 (three) times daily., Disp: , Rfl:  .  metFORMIN (GLUCOPHAGE) 500 MG tablet, TAKE 2 TABLETS BY MOUTH TWICE A DAY WITH A MEAL, Disp: 360 tablet, Rfl: 0 .  omeprazole (PRILOSEC) 40 MG capsule, Take 1 capsule (40 mg total) by mouth daily., Disp: 30 capsule, Rfl: 3 .  oxyCODONE-acetaminophen (PERCOCET) 10-325 MG tablet, Take 1 tablet by mouth every 6  (six) hours as needed for pain., Disp: 120 tablet, Rfl: 0 .  rosuvastatin (CRESTOR) 20 MG tablet, TAKE 1 TABLET BY MOUTH EVERY DAY, Disp: 90 tablet, Rfl: 2 .  sildenafil (VIAGRA) 100 MG tablet, Take 100 mg by mouth as needed for erectile dysfunction (Take one-half tablet by mouth as instructed (take 1 hour prior to sexual activity *do not exceed 1 dose per 24 hour period*))., Disp: , Rfl:  .  traZODone (DESYREL) 100 MG tablet, Take 100 mg by mouth at bedtime as needed for sleep. , Disp: , Rfl: 0 .  triamcinolone (KENALOG) 0.025 % ointment, Apply 1 application topically 2 (two) times daily., Disp: 454 g, Rfl: 0 .  venlafaxine XR (EFFEXOR-XR) 150 MG 24 hr capsule, Take 1 capsule by mouth daily., Disp: , Rfl:  .  verapamil (CALAN) 80 MG tablet, , Disp: , Rfl:    Allergies  Allergen Reactions  . Atorvastatin Swelling  . Tizanidine Hives  . Cymbalta [Duloxetine Hcl]     Stomach upset, difficulty urinating, decreased libido.   Review of Systems   Pertinent items are noted in the HPI. Otherwise, a complete ROS is negative.  Vitals   Vitals:   03/28/18 1104  BP: 110/66  Pulse: 87  Temp: 98.6 F (37 C)  TempSrc: Oral  SpO2: 96%  Weight: 258 lb (117 kg)  Height: '5\' 10"'  (1.778 m)     Body mass index is 37.02 kg/m.  Physical Exam   Physical Exam Vitals signs and nursing note reviewed.  Constitutional:      General: He is not in acute distress.    Appearance: He is well-developed.  HENT:     Head: Normocephalic and atraumatic.     Right Ear: External ear normal.     Left Ear: External ear normal.     Nose:     Left Sinus: Maxillary sinus tenderness and frontal sinus tenderness present.  Eyes:     Conjunctiva/sclera: Conjunctivae normal.     Pupils: Pupils are equal, round, and reactive to light.  Neck:     Musculoskeletal: Normal range of motion and neck supple.  Cardiovascular:     Rate and Rhythm: Normal rate and regular rhythm.     Heart sounds: Normal heart sounds.    Pulmonary:     Effort: Pulmonary effort is normal.     Breath sounds: Normal breath sounds.  Abdominal:     General: Bowel sounds are normal.     Palpations: Abdomen is soft.  Skin:    General: Skin is warm and dry.  Neurological:     Mental Status: He is alert and oriented to person, place, and time.  Psychiatric:        Behavior: Behavior normal.        Thought Content: Thought content normal.  Judgment: Judgment normal.     Results for orders placed or performed in visit on 03/06/18  POC Influenza A&B(BINAX/QUICKVUE)  Result Value Ref Range   Influenza A, POC Negative Negative   Influenza B, POC Negative Negative  POCT Glucose (CBG)  Result Value Ref Range   POC Glucose 94 70 - 99 mg/dl    Assessment and Plan   Chronic pain syndrome Patient doing well with weight loss.  He is working on exercising as tolerated and eating healthy foods.  His pain is at baseline, which is an 8 out of 10.  He has multiple degenerative changes of his lumbar spine and large joints due to his time in the [employer].  This has severely limited his mobility and impaired his function and lifestyle.  He has undergone epidurals and joint injections, physical therapy multiple times, he is still actively seeing acupuncture and the chiropractor.  He is doing tai chi and gentle yoga poses.  He has multiple massage devices at home to help with muscle spasms.  And he has been taking prescribed medications for pain without concerns.  Unfortunately, his pain is just not controlled.  Also unfortunately, his disability claim has been denied.  I will work on locating a pain specialist that may have some new techniques for pain control.  We discussed a spinal cord stimulator or morphine pump as possibilities.  Type 2 diabetes mellitus with diabetic polyneuropathy, without long-term current use of insulin (H[facility]) Tolerating medications without side effects.  Continues to maintain a diabetic diet.  Exercises as  tolerated.  Medications include Trulicity and metformin.  He is on a statin and aspirin.  Labs today.  Hyperlipidemia associated with type 2 diabetes mellitus ([place]), on statin Tolerating medications without side effects.  At the last lipid check, he had not been taking a statin for about 3 weeks.  He has been taking it consistently lately.  We will recheck labs.  No myalgias.  Insomnia Sleep continues to be an issue for this patient.  He does go to bed at a reasonable hour but pain wakes him.  Morbid obesity ([place]) Patient with BMI above 35 and multiple comorbid conditions.  We continue to discuss healthy food choices and exercise as tolerated.  He is doing a great job at this time.  Obstructive sleep apnea Compliant with CPAP.  Depression, recurrent ([place]) Patient has significant anxiety depression related to his chronic pain.  He is compliant with all prescribed treatments.  He has associated PTSD from his time in the [place] and insomnia.  Continue with current treatment for now.  No suicidal homicidal thoughts.  Orders Placed This Encounter  Procedures  . CBC with Differential/Platelet  . Comprehensive metabolic panel  . Hemoglobin A1c  . Lipid panel  . TSH  . Testos,Total,Free and SHBG (Male)  . B12   No orders of the defined types were placed in this encounter.  . Reviewed expectations re: course of current medical issues. . Discussed self-management of symptoms. . Outlined signs and symptoms indicating need for more acute intervention. . Patient verbalized understanding and all questions were answered. Marland Kitchen Health Maintenance issues including appropriate healthy diet, exercise, and smoking avoidance were discussed with patient. . See orders for this visit as documented in the electronic medical record. . Patient received an After Visit Summary.  Briscoe Deutscher, DO [place], [facility] 03/29/2018

## 2018-03-29 ENCOUNTER — Encounter: Payer: Self-pay | Admitting: Family Medicine

## 2018-03-29 DIAGNOSIS — Z6838 Body mass index (BMI) 38.0-38.9, adult: Secondary | ICD-10-CM | POA: Insufficient documentation

## 2018-03-29 NOTE — Assessment & Plan Note (Signed)
Patient has significant anxiety depression related to his chronic pain.  He is compliant with all prescribed treatments.  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  Continue with current treatment for now.  No suicidal homicidal thoughts.

## 2018-03-29 NOTE — Assessment & Plan Note (Signed)
Compliant with CPAP 

## 2018-03-29 NOTE — Assessment & Plan Note (Signed)
Sleep continues to be an issue for this patient.  He does go to bed at a reasonable hour but pain wakes him.

## 2018-03-29 NOTE — Assessment & Plan Note (Signed)
Tolerating medications without side effects.  Continues to maintain a diabetic diet.  Exercises as tolerated.  Medications include Trulicity and metformin.  He is on a statin and aspirin.  Labs today.

## 2018-03-29 NOTE — Assessment & Plan Note (Signed)
Patient with BMI above 35 and multiple comorbid conditions.  We continue to discuss healthy food choices and exercise as tolerated.  He is doing a great job at this time.

## 2018-03-29 NOTE — Assessment & Plan Note (Addendum)
Patient doing well with weight loss.  He is working on exercising as tolerated and eating healthy foods.  His pain is at baseline, which is an 8 out of 10.  He has multiple degenerative changes of his lumbar spine and large joints due to his time in the [employer].  This has severely limited his mobility and impaired his function and lifestyle.  He has undergone epidurals and joint injections, physical therapy multiple times, he is still actively seeing acupuncture and the chiropractor.  He is doing tai chi and gentle yoga poses.  He has multiple massage devices at home to help with muscle spasms.  And he has been taking prescribed medications for pain without concerns.  Unfortunately, his pain is just not controlled.  Also unfortunately, his disability claim has been denied.  I will work on locating a pain specialist that may have some new techniques for pain control.  We discussed a spinal cord stimulator or morphine pump as possibilities.

## 2018-03-29 NOTE — Assessment & Plan Note (Signed)
Tolerating medications without side effects.  At the last lipid check, he had not been taking a statin for about 3 weeks.  He has been taking it consistently lately.  We will recheck labs.  No myalgias.

## 2018-04-03 ENCOUNTER — Encounter: Payer: Self-pay | Admitting: Family Medicine

## 2018-05-29 ENCOUNTER — Encounter: Payer: Self-pay | Admitting: Family Medicine

## 2018-11-23 DIAGNOSIS — G4733 Obstructive sleep apnea (adult) (pediatric): Secondary | ICD-10-CM | POA: Diagnosis not present

## 2018-12-30 DIAGNOSIS — Z20828 Contact with and (suspected) exposure to other viral communicable diseases: Secondary | ICD-10-CM | POA: Diagnosis not present

## 2018-12-31 ENCOUNTER — Telehealth: Payer: Self-pay | Admitting: Internal Medicine

## 2018-12-31 NOTE — Telephone Encounter (Signed)
Copied from [place] [id number]. Topic: Appointment Scheduling - Transfer of Care >> Dec 31, 2018 11:22 AM Parke Poisson wrote: Pt is requesting to transfer FROM: Dr Juleen China Pt is requesting to transfer TO: Dr Alain Marion Reason for requested transfer: She is no longer at [facility] to patient's current PCP (transferring FROM).

## 2019-01-01 NOTE — Telephone Encounter (Signed)
Unfortunately, I'm not able to accept any more new patients at this time.  I'm sorry! Thank you!  

## 2019-01-02 NOTE — Telephone Encounter (Signed)
Please see MD response.

## 2019-01-02 NOTE — Telephone Encounter (Signed)
See note, contact pt to see about transferring to a different provider

## 2019-01-03 NOTE — Telephone Encounter (Signed)
Patient is scheduled with Dr. Jonni Sanger.

## 2019-01-03 NOTE — Telephone Encounter (Signed)
[name], patient would like a call from you to see about getting worked in with any of the [facility] providers based on location. [facility] says [facility] is not accepting patients at all on each provider. Please contact patient to advise.   I did mention that we at [facility] do have providers however, he wants to speak with you first about [facility] providers before driving out to our office instead.

## 2019-01-03 NOTE — Telephone Encounter (Signed)
I have spoken with patient.  I have informed him that we do not have an MD taking patients at this time.

## 2019-02-25 ENCOUNTER — Other Ambulatory Visit: Payer: Self-pay

## 2019-02-25 ENCOUNTER — Other Ambulatory Visit (INDEPENDENT_AMBULATORY_CARE_PROVIDER_SITE_OTHER): Payer: BC Managed Care – PPO

## 2019-02-25 DIAGNOSIS — E1169 Type 2 diabetes mellitus with other specified complication: Secondary | ICD-10-CM

## 2019-02-25 DIAGNOSIS — E538 Deficiency of other specified B group vitamins: Secondary | ICD-10-CM | POA: Diagnosis not present

## 2019-02-25 DIAGNOSIS — E1142 Type 2 diabetes mellitus with diabetic polyneuropathy: Secondary | ICD-10-CM | POA: Diagnosis not present

## 2019-02-25 DIAGNOSIS — E785 Hyperlipidemia, unspecified: Secondary | ICD-10-CM | POA: Diagnosis not present

## 2019-02-25 DIAGNOSIS — R5383 Other fatigue: Secondary | ICD-10-CM

## 2019-02-25 LAB — COMPREHENSIVE METABOLIC PANEL WITH GFR
ALT: 13 U/L (ref 0–53)
AST: 15 U/L (ref 0–37)
Albumin: 4.2 g/dL (ref 3.5–5.2)
Alkaline Phosphatase: 50 U/L (ref 39–117)
BUN: 10 mg/dL (ref 6–23)
CO2: 25 meq/L (ref 19–32)
Calcium: 9.1 mg/dL (ref 8.4–10.5)
Chloride: 106 meq/L (ref 96–112)
Creatinine, Ser: 0.82 mg/dL (ref 0.40–1.50)
GFR: 120.48 mL/min
Glucose, Bld: 83 mg/dL (ref 70–99)
Potassium: 4.1 meq/L (ref 3.5–5.1)
Sodium: 138 meq/L (ref 135–145)
Total Bilirubin: 0.3 mg/dL (ref 0.2–1.2)
Total Protein: 7.2 g/dL (ref 6.0–8.3)

## 2019-02-25 LAB — LIPID PANEL
Cholesterol: 130 mg/dL (ref 0–200)
HDL: 47.6 mg/dL (ref >39.00)
LDL Cholesterol: 69 mg/dL (ref 0–99)
NonHDL: 82.55
Total CHOL/HDL Ratio: 3
Triglycerides: 68 mg/dL (ref 0.0–149.0)
VLDL: 13.6 mg/dL (ref 0.0–40.0)

## 2019-02-25 LAB — CBC WITH DIFFERENTIAL/PLATELET
Basophils Absolute: 0.1 10*3/uL (ref 0.0–0.1)
Basophils Relative: 1.2 % (ref 0.0–3.0)
Eosinophils Absolute: 0.1 10*3/uL (ref 0.0–0.7)
Eosinophils Relative: 1.6 % (ref 0.0–5.0)
HCT: 42.4 % (ref 39.0–52.0)
Hemoglobin: 13.8 g/dL (ref 13.0–17.0)
Lymphocytes Relative: 42.5 % (ref 12.0–46.0)
Lymphs Abs: 2.7 10*3/uL (ref 0.7–4.0)
MCHC: 32.6 g/dL (ref 30.0–36.0)
MCV: 75 fl — ABNORMAL LOW (ref 78.0–100.0)
Monocytes Absolute: 0.4 10*3/uL (ref 0.1–1.0)
Monocytes Relative: 5.7 % (ref 3.0–12.0)
Neutro Abs: 3.1 10*3/uL (ref 1.4–7.7)
Neutrophils Relative %: 49 % (ref 43.0–77.0)
Platelets: 222 10*3/uL (ref 150.0–400.0)
RBC: 5.65 Mil/uL (ref 4.22–5.81)
RDW: 17.1 % — ABNORMAL HIGH (ref 11.5–15.5)
WBC: 6.4 10*3/uL (ref 4.0–10.5)

## 2019-02-25 LAB — TSH: TSH: 1.44 u[IU]/mL (ref 0.35–4.50)

## 2019-02-25 LAB — TESTOSTERONE: Testosterone: 282.85 ng/dL — ABNORMAL LOW (ref 300.00–890.00)

## 2019-02-25 LAB — VITAMIN B12: Vitamin B-12: 323 pg/mL (ref 211–911)

## 2019-02-25 LAB — HEMOGLOBIN A1C: Hgb A1c MFr Bld: 7.1 % — ABNORMAL HIGH (ref 4.6–6.5)

## 2019-02-26 ENCOUNTER — Other Ambulatory Visit: Payer: Self-pay

## 2019-02-26 DIAGNOSIS — G4733 Obstructive sleep apnea (adult) (pediatric): Secondary | ICD-10-CM | POA: Diagnosis not present

## 2019-02-27 ENCOUNTER — Encounter: Payer: Self-pay | Admitting: Family Medicine

## 2019-02-27 ENCOUNTER — Ambulatory Visit (INDEPENDENT_AMBULATORY_CARE_PROVIDER_SITE_OTHER): Payer: BC Managed Care – PPO | Admitting: Family Medicine

## 2019-02-27 VITALS — BP 152/90 | HR 94 | Temp 98.0°F | Ht 70.0 in | Wt 258.8 lb

## 2019-02-27 DIAGNOSIS — E1169 Type 2 diabetes mellitus with other specified complication: Secondary | ICD-10-CM | POA: Diagnosis not present

## 2019-02-27 DIAGNOSIS — Z8673 Personal history of transient ischemic attack (TIA), and cerebral infarction without residual deficits: Secondary | ICD-10-CM

## 2019-02-27 DIAGNOSIS — F1190 Opioid use, unspecified, uncomplicated: Secondary | ICD-10-CM

## 2019-02-27 DIAGNOSIS — E538 Deficiency of other specified B group vitamins: Secondary | ICD-10-CM

## 2019-02-27 DIAGNOSIS — K219 Gastro-esophageal reflux disease without esophagitis: Secondary | ICD-10-CM

## 2019-02-27 DIAGNOSIS — G894 Chronic pain syndrome: Secondary | ICD-10-CM | POA: Diagnosis not present

## 2019-02-27 DIAGNOSIS — G4733 Obstructive sleep apnea (adult) (pediatric): Secondary | ICD-10-CM

## 2019-02-27 DIAGNOSIS — M5442 Lumbago with sciatica, left side: Secondary | ICD-10-CM

## 2019-02-27 DIAGNOSIS — F4312 Post-traumatic stress disorder, chronic: Secondary | ICD-10-CM

## 2019-02-27 DIAGNOSIS — M5441 Lumbago with sciatica, right side: Secondary | ICD-10-CM | POA: Diagnosis not present

## 2019-02-27 DIAGNOSIS — G8929 Other chronic pain: Secondary | ICD-10-CM

## 2019-02-27 DIAGNOSIS — E785 Hyperlipidemia, unspecified: Secondary | ICD-10-CM

## 2019-02-27 DIAGNOSIS — E1142 Type 2 diabetes mellitus with diabetic polyneuropathy: Secondary | ICD-10-CM | POA: Diagnosis not present

## 2019-02-27 DIAGNOSIS — N400 Enlarged prostate without lower urinary tract symptoms: Secondary | ICD-10-CM

## 2019-02-27 LAB — MICROALBUMIN / CREATININE URINE RATIO
Creatinine,U: 37 mg/dL
Microalb Creat Ratio: 1.9 mg/g (ref 0.0–30.0)
Microalb, Ur: <0.7 mg/dL (ref 0.0–1.9)

## 2019-02-27 MED ORDER — OXYCODONE-ACETAMINOPHEN 10-325 MG PO TABS: 1.0000 | ORAL_TABLET | Freq: Four times a day (QID) | ORAL | 0 refills | Status: DC | PRN

## 2019-02-27 MED ORDER — FARXIGA 10 MG PO TABS: 10.0000 mg | ORAL_TABLET | Freq: Every day | ORAL | 3 refills | Status: DC

## 2019-02-27 MED ORDER — VITAMIN B-12 100 MCG PO TABS: 100.0000 ug | ORAL_TABLET | Freq: Every day | ORAL | Status: DC

## 2019-02-27 NOTE — Progress Notes (Signed)
Subjective  CC:  Chief Complaint  Patient presents with  . Transitions Of Care    TOC from Dr. Earlene Plater  . Diabetes    Blood readings are stable. Pt checks readings at home once a week. Has not uses Trulicity since first dose a few months ago due to price.  . Hyperlipidemia    Patient is currently taking medication. No side effects.  . Sleep Apnea    Has appointment with Dr. Vassie Loll tomorrow. Pt uses machine daily.    HPI: [name] is a 49 y.o. male who presents to [facility] today to establish care with me as a new patient.  TOC to establish care. Last ov 04/2019! I reviewed chart.  Pleasant 49 year old male on disability for chronic back pain with multiple medical problems as listed below.  Reviewed each problem in detail and discussed treatment options and care plan.  He has the following concerns or needs:  Diabetes follow up: His diabetic control is reported as Improved.  He was on Trulicity but had to stop due to cost.  The [place] gave him a D DPP 4 inhibitor to start.  He has been on this for a month.  No adverse effects.  Denies symptoms of hypoglycemia He denies exertional CP or SOB or symptomatic hypoglycemia. He denies foot sores but admits to mild paresthesias.  He has multiple chronic problems including history of a CVA, hyperlipidemia on a statin which is well-tolerated, sleep apnea on CPAP followed by pulmonology, history of BPH which is currently asymptomatic but he has seen a neurologist in the past, history of GERD which is well controlled currently, PTSD and depression on medications doing well.  He is disabled and lives with his wife.  His main current problem continues to be pain management.  He uses oxycodone 3 times weekly.  However his pain is uncontrolled.  I reviewed notes and spoke to patient.  He has failed multiple other pain management strategies including orthopedics, physical therapy, TENS unit, epidural steroid injections and  management by Ortho.  He recently has had right-sided buttock numbness and pain.  No weakness. Immunization History  Administered Date(s) Administered  . Influenza Split 09/13/2011  . Influenza Whole 11/18/2008  . Influenza,inj,Quad PF,6+ Mos 11/15/2012, 11/12/2013, 09/26/2016, 11/21/2017, 11/19/2018  . Pneumococcal Polysaccharide-23 12/22/2010  . Td 03/16/2009    Diabetes Related Lab Review: Lab Results  Component Value Date   HGBA1C 7.1 (H) 02/25/2019   HGBA1C 7.2 (H) 12/05/2017   HGBA1C 8.1 (H) 09/01/2017    Lab Results  Component Value Date   MICROALBUR 1.0 09/01/2017   Lab Results  Component Value Date   CREATININE 0.82 02/25/2019   BUN 10 02/25/2019   NA 138 02/25/2019   K 4.1 02/25/2019   CL 106 02/25/2019   CO2 25 02/25/2019   Lab Results  Component Value Date   CHOL 130 02/25/2019   CHOL 262 (H) 12/05/2017   CHOL 249 (H) 02/07/2014   Lab Results  Component Value Date   HDL 47.60 02/25/2019   HDL 52.00 12/05/2017   HDL 38.90 (L) 02/07/2014   Lab Results  Component Value Date   LDLCALC 69 02/25/2019   LDLCALC 182 (H) 12/05/2017   LDLCALC 182 (H) 02/07/2014   Lab Results  Component Value Date   TRIG 68.0 02/25/2019   TRIG 141.0 12/05/2017   TRIG 139.0 02/07/2014   Lab Results  Component Value Date   CHOLHDL 3 02/25/2019  CHOLHDL 5 12/05/2017   CHOLHDL 6 02/07/2014   Lab Results  Component Value Date   LDLDIRECT 148.1 11/04/2010   LDLDIRECT 207.7 02/08/2007   The 10-year ASCVD risk score Denman George(Goff DC Jr., et al., 2013) is: 11.3%   Values used to calculate the score:     Age: [age] years     Sex: Male     Is Non-Hispanic African American: Yes     Diabetic: Yes     Tobacco smoker: No     Systolic Blood Pressure: 152 mmHg     Is BP treated: No     HDL Cholesterol: 47.6 mg/dL     Total Cholesterol: 130 mg/dL  BP Readings from Last 3 Encounters:  02/27/19 (!) 152/90  03/28/18 110/66  03/06/18 110/68   Wt Readings from Last 3 Encounters:   02/27/19 258 lb 12.8 oz (117.4 kg)  03/28/18 258 lb (117 kg)  03/06/18 261 lb (118.4 kg)    Health Maintenance  Topic Date Due  . URINE MICROALBUMIN  09/02/2018  . TETANUS/TDAP  03/17/2019  . OPHTHALMOLOGY EXAM  03/28/2019  . HEMOGLOBIN A1C  08/25/2019  . FOOT EXAM  02/27/2020  . INFLUENZA VACCINE  Completed  . PNEUMOCOCCAL POLYSACCHARIDE VACCINE AGE 69-64 HIGH RISK  Completed  . HIV Screening  Completed    Assessment  1. Type 2 diabetes mellitus with diabetic polyneuropathy, without long-term current use of insulin (HCC)   2. Hyperlipidemia associated with type 2 diabetes mellitus (HCC)   3. Chronic midline low back pain with bilateral sciatica   4. Chronic pain syndrome   5. History of CVA (cerebrovascular accident)   6. Obstructive sleep apnea   7. Chronic post-traumatic stress disorder (PTSD) after military combat   8. Benign prostatic hyperplasia without lower urinary tract symptoms   9. Vitamin B 12 deficiency   10. Chronic narcotic use   11. Gastroesophageal reflux disease without esophagitis      Plan   Time: today I spent 60 minutes reviewing chart, labs, prior problems, specialists visits and face to face time discussing chronic mgt of his problems.   DM: fairly well controlled. Add farxiga if able. Educated. Recheck 3 months. rec close f/u.   HLD is at goal on statin. Nl lfts  Chronic pain: uncontrolled. Has failed conservative therapies and alternative therapies. On intermittent narcotics chronically. Refer to pain mgt to get on better regimen. F/u with ortho as needed. Refilled meds today. I reviewed patient's records from the PMP aware controlled substance registry today.  It shows prescriptions from 2019.  Obstructive sleep apnea on CPAP.  Reports good compliance and efficacy  PTSD and depression stable on Effexor.  Insomnia stable on trazodone  History of CVA and TIAs: Continue aspirin therapy.  Recommend excellent control of diabetes, blood pressure and  lipids.  Continue statin.  No current symptoms.  Does not currently have a neurologist.  Vitamin B12 deficiency: Recommend restarting vitamin B12 supplementation daily.  GERD: Now well controlled with as needed Pepcid use only.  Health maintenance is currently up-to-date.  Elevated blood pressure without history of hypertension.  Patient reports due to stress today.  Will monitor.  Follow up:  No follow-ups on file. Orders Placed This Encounter  Procedures  . Microalbumin / creatinine urine ratio  . Ambulatory referral to Pain Clinic   Meds ordered this encounter  Medications  . dapagliflozin propanediol (FARXIGA) 10 MG TABS tablet    Sig: Take 10 mg by mouth daily.    Dispense:  90 tablet    Refill:  3  . oxyCODONE-acetaminophen (PERCOCET) 10-325 MG tablet    Sig: Take 1 tablet by mouth every 6 (six) hours as needed for pain.    Dispense:  120 tablet    Refill:  0  . vitamin B-12 (CYANOCOBALAMIN) 100 MCG tablet    Sig: Take 1 tablet (100 mcg total) by mouth daily.    Dispense:        Depression screen [facility] 2/9 02/27/2019 03/28/2018 09/01/2017 05/30/2017 08/30/2016  Decreased Interest 1 1 3 3  0  Down, Depressed, Hopeless 1 1 2 3  0  PHQ - 2 Score 2 2 5 6  0  Altered sleeping 1 1 3 3  -  Tired, decreased energy 3 1 3 3  -  Change in appetite 0 1 3 2  -  Feeling bad or failure about yourself  1 0 1 1 -  Trouble concentrating 2 1 3 3  -  Moving slowly or fidgety/restless 0 1 3 2  -  Suicidal thoughts 0 0 0 1 -  PHQ-9 Score 9 7 21 21  -  Difficult doing work/chores Somewhat difficult Somewhat difficult Very difficult - -    We updated and reviewed the patient's past history in detail and it is documented below.  Patient Active Problem List   Diagnosis Date Noted  . Morbid obesity ([place]) 03/29/2018    Priority: High  . Chronic post-traumatic stress disorder (PTSD) after military combat 06/03/2017    Priority: High  . Major depression, recurrent, chronic ([place]) 01/01/2017    Priority:  High  . Chronic pain syndrome 09/03/2016    Priority: High  . Type 2 diabetes mellitus with diabetic polyneuropathy, without long-term current use of insulin ([place]) 12/22/2010    Priority: High    dxd in 2009   . Hyperlipidemia associated with type 2 diabetes mellitus ([place]), on statin 08/23/2006    Priority: High  . Obstructive sleep apnea 08/23/2006    Priority: High    NPSG 2006:  AHI 78/hr. Auto optimized to 13-14 cm.  Autoset 01/2011 to 13 cm.   . Chronic low back pain 08/23/2006    Priority: High    Using back brace.  Using as needed Percocet.    . Seizure disorder ([place]) 08/23/2006    Priority: High  . History of CVA (cerebrovascular accident) 08/23/2006    Priority: High  . Insomnia 07/15/2015    Priority: Medium  . BPH (benign prostatic hyperplasia) 11/15/2012    Priority: Medium  . Gout 08/23/2006    Priority: Medium  . Vitamin B 12 deficiency 12/09/2017    Priority: Low  . Allergic rhinitis 08/23/2006    Priority: Low    Generally controlled with Zyrtec.   . Inflammatory spondylopathy of lumbar region ([place]) 09/02/2017  . Chronic right shoulder pain 06/03/2017  . Trigger thumb, right thumb 05/29/2015  . Carpal tunnel syndrome on right 02/06/2015  . Cervical spondylosis without myelopathy 02/06/2015  . Lumbar facet arthropathy 08/02/2011  . Lumbar spondylosis 08/02/2011  . Chronic pain of both knees 12/08/2008    Severe. Daily pain 8/10, down to 7/10 with antiinflammatory. Interfering with life. Limited exercise due to pain.       Health Maintenance  Topic Date Due  . URINE MICROALBUMIN  09/02/2018  . TETANUS/TDAP  03/17/2019  . OPHTHALMOLOGY EXAM  03/28/2019  . HEMOGLOBIN A1C  08/25/2019  . FOOT EXAM  02/27/2020  . INFLUENZA VACCINE  Completed  . PNEUMOCOCCAL POLYSACCHARIDE VACCINE AGE 74-64 HIGH RISK  Completed  .  HIV Screening  Completed   Immunization History  Administered Date(s) Administered  . Influenza Split 09/13/2011  . Influenza Whole  11/18/2008  . Influenza,inj,Quad PF,6+ Mos 11/15/2012, 11/12/2013, 09/26/2016, 11/21/2017, 11/19/2018  . Pneumococcal Polysaccharide-23 12/22/2010  . Td 03/16/2009   Current Meds  Medication Sig  . acetaminophen (TYLENOL) 500 MG tablet Take 1,000 mg by mouth as needed.  . Alogliptin Benzoate 25 MG TABS Take by mouth daily. Take half a tablet by mouth daily  . aspirin EC 81 MG tablet Take 1 tablet (81 mg total) by mouth daily.  . cetirizine (ZYRTEC) 10 MG tablet Take 10 mg by mouth every other day.  . cyclobenzaprine (FLEXERIL) 10 MG tablet Take 10 mg by mouth 3 (three) times daily.  . diclofenac (VOLTAREN) 75 MG EC tablet Take 75 mg by mouth 2 (two) times daily.  . diclofenac sodium (VOLTAREN) 1 % GEL Apply 2 g topically 2 (two) times daily.  . famotidine (PEPCID) 20 MG tablet Take 1 tablet (20 mg total) by mouth 2 (two) times daily as needed for heartburn or indigestion.  . fluticasone (FLONASE) 50 MCG/ACT nasal spray Place 2 sprays into both nostrils daily.  Marland Kitchen MENTHOL-METHYL SALICYLATE EX Apply 10-15 % topically 3 (three) times daily.  . metFORMIN (GLUCOPHAGE) 500 MG tablet TAKE 2 TABLETS BY MOUTH TWICE A DAY WITH A MEAL  . oxyCODONE-acetaminophen (PERCOCET) 10-325 MG tablet Take 1 tablet by mouth every 6 (six) hours as needed for pain.  . rosuvastatin (CRESTOR) 20 MG tablet TAKE 1 TABLET BY MOUTH EVERY DAY  . sildenafil (VIAGRA) 100 MG tablet Take 100 mg by mouth as needed for erectile dysfunction (Take one-half tablet by mouth as instructed (take 1 hour prior to sexual activity *do not exceed 1 dose per 24 hour period*)).  Marland Kitchen traZODone (DESYREL) 100 MG tablet Take 100 mg by mouth at bedtime as needed for sleep.   Marland Kitchen triamcinolone (KENALOG) 0.025 % ointment Apply 1 application topically 2 (two) times daily.  Marland Kitchen venlafaxine XR (EFFEXOR-XR) 150 MG 24 hr capsule Take 1 capsule by mouth daily.  . [DISCONTINUED] famotidine (PEPCID) 20 MG tablet Take 1 tablet (20 mg total) by mouth 2 (two) times  daily.  . [DISCONTINUED] oxyCODONE-acetaminophen (PERCOCET) 10-325 MG tablet Take 1 tablet by mouth every 6 (six) hours as needed for pain.  . [DISCONTINUED] verapamil (CALAN) 80 MG tablet     Allergies: Patient is allergic to atorvastatin; tizanidine; and cymbalta [duloxetine hcl]. Past Medical History Patient  has a past medical history of Adrenal abnormality (HCC), Allergy, Atypical chest pain, BPH (benign prostatic hypertrophy), DM (diabetes mellitus) (HCC), GERD (gastroesophageal reflux disease), Gout, H/O: CVA (cardiovascular accident), Hyperlipidemia, Knee pain, bilateral, LBP (low back pain), OSA (obstructive sleep apnea), Plantar fasciitis, and Seizure disorder (HCC). Past Surgical History Patient  has a past surgical history that includes Trigger finger release (Right, 05/14/2015); Neck surgery; Shoulder surgery (Right); and Heel spur surgery (Left). Family History: Patient family history includes Allergies in his daughter; Cancer in his maternal uncle; Diabetes in his father; Heart attack in his maternal grandfather; Hypertension in his father. Social History:  Patient  reports that he quit smoking about 28 years ago. His smoking use included cigarettes. He quit after 4.00 years of use. He has never used smokeless tobacco. He reports that he does not drink alcohol or use drugs.  Review of Systems: Constitutional: negative for fever or malaise Ophthalmic: negative for photophobia, double vision or loss of vision Cardiovascular: negative for chest pain, dyspnea on  exertion, or new LE swelling Respiratory: negative for SOB or persistent cough Gastrointestinal: negative for abdominal pain, change in bowel habits or melena Genitourinary: negative for dysuria or gross hematuria Musculoskeletal: negative for new gait disturbance or muscular weakness Integumentary: negative for new or persistent rashes Neurological: negative for TIA or stroke symptoms Psychiatric: negative for SI or  delusions Allergic/Immunologic: negative for hives  Patient Care Team    Relationship Specialty Notifications Start End  Willow Ora, MD PCP - General Family Medicine  02/27/19     Objective  Vitals: BP (!) 152/90 (BP Location: Left Arm, Patient Position: Sitting, Cuff Size: Large)   Pulse 94   Temp 98 F (36.7 C) (Temporal)   Ht 5\' 10"  (1.778 m)   Wt 258 lb 12.8 oz (117.4 kg)   SpO2 98%   BMI 37.13 kg/m  General:  Well developed, well nourished, no acute distress  Psych:  Alert and oriented,normal mood and affect HEENT:  Normocephalic, atraumatic, non-icteric sclera, PERRL, oropharynx is without mass or exudate, supple neck without adenopathy, mass or thyromegaly Cardiovascular:  RRR without gallop, rub or murmur, no edema Respiratory:  Good breath sounds bilaterally, CTAB with normal respiratory effort Gastrointestinal: normal bowel sounds, soft, non-tender, no noted masses. No HSM Neurologic:    Mental status is normal.  Diabetic Foot Exam: Appearance - no lesions, ulcers or calluses Skin - no sigificant pallor or erythema Monofilament testing - sensitive bilaterally in following locations:  Right - Great toe, medial, central, lateral ball and posterior foot intact  Left - Great toe, medial, central, lateral ball and posterior foot intact Pulses - +2 distally bilaterally   Commons side effects, risks, benefits, and alternatives for medications and treatment plan prescribed today were discussed, and the patient expressed understanding of the given instructions. Patient is instructed to call or message via MyChart if he/she has any questions or concerns regarding our treatment plan. No barriers to understanding were identified. We discussed Red Flag symptoms and signs in detail. Patient expressed understanding regarding what to do in case of urgent or emergency type symptoms.   Medication list was reconciled, printed and provided to the patient in AVS. Patient instructions and  summary information was reviewed with the patient as documented in the AVS. This note was prepared with assistance of Dragon voice recognition software. Occasional wrong-word or sound-a-like substitutions may have occurred due to the inherent limitations of voice recognition software  This visit occurred during the SARS-CoV-2 public health emergency.  Safety protocols were in place, including screening questions prior to the visit, additional usage of staff PPE, and extensive cleaning of exam room while observing appropriate contact time as indicated for disinfecting solutions.

## 2019-02-27 NOTE — Patient Instructions (Signed)
Please return in 3 months for diabetes follow up  We will start a new medication for your diabetes called farxiga.  We will call you with information regarding your referral appointment. Pain management office.  If you do not hear from [place] within the next 2 weeks, please let me know. It can take time to get appointments set up with theses specialists.   It was a pleasure meeting you today! Thank you for choosing [place] to meet your healthcare needs! I truly look forward to working with you. If you have any questions or concerns, please send me a message via Mychart or call the office at [phone].  Dapagliflozin tablets What is this medicine? DAPAGLIFLOZIN (DAP a gli FLOE zin) controls blood sugar in people with diabetes. It is used with lifestyle changes like diet and exercise. It also treats heart failure. It may lower the need for treatment of heart failure in the hospital. This medicine may be used for other purposes; ask your health care provider or pharmacist if you have questions. COMMON BRAND NAME(S): Marcelline Deist What should I tell my health care provider before I take this medicine? They need to know if you have any of these conditions:  dehydration  diabetic ketoacidosis  diet low in salt  eating less due to illness, surgery, dieting, or any other reason  having surgery  history of pancreatitis or pancreas problems  history of yeast infection of the penis or vagina  if you often drink alcohol  infections in the bladder, kidneys, or urinary tract  kidney disease  low blood pressure  on hemodialysis  problems urinating  type 1 diabetes  uncircumcised male  an unusual or allergic reaction to dapagliflozin, other medicines, foods, dyes, or preservatives  pregnant or trying to get pregnant  breast-feeding How should I use this medicine? Take this medicine by mouth with a glass of water. Follow the directions on the prescription label. You can take it with or without  food. If it upsets your stomach, take it with food. Take this medicine in the morning. Take your dose at the same time each day. Do not take more often than directed. Do not stop taking except on your doctor's advice. A special MedGuide will be given to you by the pharmacist with each prescription and refill. Be sure to read this information carefully each time. Talk to your pediatrician regarding the use of this medicine in children. Special care may be needed. Overdosage: If you think you have taken too much of this medicine contact a poison control center or emergency room at once. NOTE: This medicine is only for you. Do not share this medicine with others. What if I miss a dose? If you miss a dose, take it as soon as you can. If it is almost time for your next dose, take only that dose. Do not take double or extra doses. What may interact with this medicine? Do not take this medicine with any of the following medications:  gatifloxacin This medicine may also interact with the following medications:  alcohol  certain medicines for blood pressure, heart disease  diuretics  insulin  nateglinide  pioglitazone  quinolone antibiotics like ciprofloxacin, levofloxacin, ofloxacin  repaglinide  some herbal dietary supplements  steroid medicines like prednisone or cortisone  sulfonylureas like glimepiride, glipizide, glyburide  thyroid medicine This list may not describe all possible interactions. Give your health care provider a list of all the medicines, herbs, non-prescription drugs, or dietary supplements you use. Also tell them  if you smoke, drink alcohol, or use illegal drugs. Some items may interact with your medicine. What should I watch for while using this medicine? Visit your doctor or health care professional for regular checks on your progress. This medicine can cause a serious condition in which there is too much acid in the blood. If you develop nausea, vomiting,  stomach pain, unusual tiredness, or breathing problems, stop taking this medicine and call your doctor right away. If possible, use a ketone dipstick to check for ketones in your urine. A test called the HbA1C (A1C) will be monitored. This is a simple blood test. It measures your blood sugar control over the last 2 to 3 months. You will receive this test every 3 to 6 months. Learn how to check your blood sugar. Learn the symptoms of low and high blood sugar and how to manage them. Always carry a quick-source of sugar with you in case you have symptoms of low blood sugar. Examples include hard sugar candy or glucose tablets. Make sure others know that you can choke if you eat or drink when you develop serious symptoms of low blood sugar, such as seizures or unconsciousness. They must get medical help at once. Tell your doctor or health care professional if you have high blood sugar. You might need to change the dose of your medicine. If you are sick or exercising more than usual, you might need to change the dose of your medicine. Do not skip meals. Ask your doctor or health care professional if you should avoid alcohol. Many nonprescription cough and cold products contain sugar or alcohol. These can affect blood sugar. Wear a medical ID bracelet or chain, and carry a card that describes your disease and details of your medicine and dosage times. What side effects may I notice from receiving this medicine? Side effects that you should report to your doctor or health care professional as soon as possible:  allergic reactions like skin rash, itching or hives, swelling of the face, lips, or tongue  breathing problems  dizziness  feeling faint or lightheaded, falls  muscle weakness  nausea, vomiting, unusual stomach upset or pain  new pain or tenderness, change in skin color, sores or ulcers, or infection in legs or feet  penile discharge, itching, or pain in men  signs and symptoms of a genital  infection, such as fever; tenderness, redness, or swelling in the genitals or area from the genitals to the back of the rectum  signs and symptoms of low blood sugar such as feeling anxious, confusion, dizziness, increased hunger, unusually weak or tired, sweating, shakiness, cold, irritable, headache, blurred vision, fast heartbeat, loss of consciousness  signs and symptoms of a urinary tract infection, such as fever, chills, a burning feeling when urinating, blood in the urine, back pain  trouble passing urine or change in the amount of urine, including an urgent need to urinate more often, in larger amounts, or at night  unusual tiredness  vaginal discharge, itching, or odor in women Side effects that usually do not require medical attention (report to your doctor or health care professional if they continue or are bothersome):  mild increase in urination  thirsty This list may not describe all possible side effects. Call your doctor for medical advice about side effects. You may report side effects to FDA at 1-800-FDA-1088. Where should I keep my medicine? Keep out of the reach of children. Store at room temperature between 15 and 30 degrees C (59 and  86 degrees F). Throw away any unused medicine after the expiration date. NOTE: This sheet is a summary. It may not cover all possible information. If you have questions about this medicine, talk to your doctor, pharmacist, or health care provider.  2020 Elsevier/Gold Standard (2018-06-07 18:58:14)

## 2019-02-28 ENCOUNTER — Ambulatory Visit: Payer: BC Managed Care – PPO | Admitting: Pulmonary Disease

## 2019-02-28 ENCOUNTER — Encounter: Payer: Self-pay | Admitting: Pulmonary Disease

## 2019-02-28 ENCOUNTER — Other Ambulatory Visit: Payer: Self-pay

## 2019-02-28 DIAGNOSIS — E1142 Type 2 diabetes mellitus with diabetic polyneuropathy: Secondary | ICD-10-CM

## 2019-02-28 DIAGNOSIS — G4733 Obstructive sleep apnea (adult) (pediatric): Secondary | ICD-10-CM

## 2019-02-28 NOTE — Patient Instructions (Signed)
CPAP is working well on the current settings of 13 cm.  If you lose weight 230 pounds, we will consider doing a home sleep study

## 2019-02-28 NOTE — Assessment & Plan Note (Signed)
Appears well controlled with good compliance on CPAP Discussed cardiovascular morbidity with his degree of OSA and diabetes. He is motivated for some more weight loss and if he gets down to 230 pounds, we can repeat a home sleep test to reassess severity  Weight loss encouraged, compliance with goal of at least 4-6 hrs every night is the expectation. Advised against medications with sedative side effects Cautioned against driving when sleepy - understanding that sleepiness will vary on a day to day basis

## 2019-02-28 NOTE — Progress Notes (Signed)
   Subjective:    Patient ID: Danella Maiers, male    DOB: [birth year], 49 y.o.   MRN: [id number]  HPI  49 yo obese, diabetic for follow-up of OSA. -diagnosed in 2006  New CPAP since 2016  On his last visit, we discussed AirFit F30 full facemask.  He did get this from the [place] a few weeks ago and seems to be tolerating this well. He reports being arrested denies excessive somnolence. Has lost 21 pounds to his current weight of 258  CT abdominal report was reviewed which shows excellent compliance on 13 cm with very few residual events and mild leak which has been corrected in the last week with change of mask  Blood pressure slight high today and mild tachycardia  Significant tests/ events reviewed  NPSG 2006: AHI 78/hr Auto optimized to 13-14cm.  autoset 01/2011: 13cm  Review of Systems Patient denies significant dyspnea,cough, hemoptysis,  chest pain, palpitations, pedal edema, orthopnea, paroxysmal nocturnal dyspnea, lightheadedness, nausea, vomiting, abdominal or  leg pains      Objective:   Physical Exam   Gen. Pleasant, obese, in no distress ENT - no lesions, no post nasal drip Neck: No JVD, no thyromegaly, no carotid bruits Lungs: no use of accessory muscles, no dullness to percussion, decreased without rales or rhonchi  Cardiovascular: Rhythm regular, heart sounds  normal, no murmurs or gallops, no peripheral edema Musculoskeletal: No deformities, no cyanosis or clubbing , no tremors        Assessment & Plan:

## 2019-02-28 NOTE — Assessment & Plan Note (Signed)
Mild hyertension and tachycardia which he attributes to whitecoat effect He will recheck his blood pressure at home

## 2019-03-20 DIAGNOSIS — G894 Chronic pain syndrome: Secondary | ICD-10-CM | POA: Diagnosis not present

## 2019-03-20 DIAGNOSIS — Z79899 Other long term (current) drug therapy: Secondary | ICD-10-CM | POA: Diagnosis not present

## 2019-03-20 DIAGNOSIS — R29898 Other symptoms and signs involving the musculoskeletal system: Secondary | ICD-10-CM | POA: Diagnosis not present

## 2019-03-20 DIAGNOSIS — M545 Low back pain: Secondary | ICD-10-CM | POA: Diagnosis not present

## 2019-03-20 DIAGNOSIS — M25561 Pain in right knee: Secondary | ICD-10-CM | POA: Diagnosis not present

## 2019-03-20 DIAGNOSIS — Z79891 Long term (current) use of opiate analgesic: Secondary | ICD-10-CM | POA: Diagnosis not present

## 2019-03-28 LAB — HM DIABETES EYE EXAM

## 2019-03-29 ENCOUNTER — Encounter: Payer: Self-pay | Admitting: Family Medicine

## 2019-03-29 NOTE — Telephone Encounter (Signed)
Can you put together a letter of need for gastric bypass. I suspect you have done several for wallace in the past? Thanks.

## 2019-03-30 ENCOUNTER — Other Ambulatory Visit: Payer: Self-pay

## 2019-04-01 ENCOUNTER — Encounter: Payer: Self-pay | Admitting: Family Medicine

## 2019-04-01 MED ORDER — METFORMIN HCL 500 MG PO TABS: ORAL_TABLET | ORAL | 2 refills | Status: DC

## 2019-04-02 DIAGNOSIS — M545 Low back pain: Secondary | ICD-10-CM | POA: Diagnosis not present

## 2019-04-02 DIAGNOSIS — M79609 Pain in unspecified limb: Secondary | ICD-10-CM | POA: Diagnosis not present

## 2019-04-04 DIAGNOSIS — Z79891 Long term (current) use of opiate analgesic: Secondary | ICD-10-CM | POA: Diagnosis not present

## 2019-04-04 DIAGNOSIS — Z79899 Other long term (current) drug therapy: Secondary | ICD-10-CM | POA: Diagnosis not present

## 2019-04-04 DIAGNOSIS — F330 Major depressive disorder, recurrent, mild: Secondary | ICD-10-CM | POA: Diagnosis not present

## 2019-04-04 DIAGNOSIS — G4700 Insomnia, unspecified: Secondary | ICD-10-CM | POA: Diagnosis not present

## 2019-04-08 ENCOUNTER — Encounter: Payer: Self-pay | Admitting: Family Medicine

## 2019-04-22 ENCOUNTER — Other Ambulatory Visit (HOSPITAL_COMMUNITY): Payer: Self-pay | Admitting: Surgery

## 2019-04-22 ENCOUNTER — Other Ambulatory Visit: Payer: Self-pay | Admitting: Surgery

## 2019-05-02 ENCOUNTER — Other Ambulatory Visit: Payer: Self-pay

## 2019-05-02 ENCOUNTER — Encounter: Payer: BC Managed Care – PPO | Attending: Surgery | Admitting: Dietician

## 2019-05-02 VITALS — Ht 70.0 in | Wt 265.4 lb

## 2019-05-02 DIAGNOSIS — Z6838 Body mass index (BMI) 38.0-38.9, adult: Secondary | ICD-10-CM | POA: Diagnosis not present

## 2019-05-02 NOTE — Progress Notes (Signed)
Nutrition Assessment  Proposed Surgery: RYGB   Height: 5'10" Weight: 265.4lbs BMI: 38.1 Upper IBW% (UIBW): 145% (UIBW 183lbs)  Patient's Goal Weight: 225lbs  Medical History: Type 2 diabetes, hyperlipidemia, gout, sleep apnea, GERD, chronic back and knee pain Medications and Supplements: acetaminophen prn, cetirizine, cyclobenzaprine prn, dapagliflozin, famotidine, fluticasone nasal spray, Menthol-methyl salicylate, metFORMIN, oxyCODONE-acetaminophen prn, rosuvastatin, sildenafil prn, venlafaxine, vitamin B12, vitamin D3  Previous surgeries: foot, knee, shoulder, and neck surgeries Drug allergies: atorvastatin, tizanidine, cymbalta (duloxetine) Food allergies: none Alcohol use: none  Tobacco use: none  Physical activity: low impact exercises 30 minutes, 2x a week  Weight history: Childhood: normal, active    Adolescence: normal, active    Adulthood: began gaining after birth of second child    Weight 1 year ago: lower than current weight until COVID  Dieting/ weight loss history: tried weight watchers, juicing, personal training; pain has limited continuance of programs -- weight loss followed by regain  Dietary Recall:  Daily pattern: 1-3 meals and 1-2 snacks. Dining out: 3-5 meals per week. Breakfast: sometimes skips; belvita, bagel; eggs and bacon and grits; pancakes; cheerios, oatmeal, coffee; smoothies Lunch: sometimes skips; sometimes chick fila, unsweet tea or balck coffee; limits green/ leafy beg due to GI upset, occ salad; sandwich if home Supper: usually cooked at home-- 3/31 grilled chicken/ salmon/ pork chop/ beef -- occ crock pot meal -- + veg, jasmine rice; occ out -- [place], [place] Snack(s): popcorn, cheezits, almonds/ pistachios with raisins or cranberries -- trail mix Beverages: water, coffee throughout the day, unsweet tea, occ cranberry juice or gatorade  Psychosocial: Emotional eating history: eats less when with depressed; sometimes high intake of sweets due  to stress Disordered eating history: no Other: none  Intervention:  Patient has researched this procedure by discussing with PCP, consulting with friends who have undergone weight loss surgery, online seminar.   Instructed him on pre-op diet guidelines, including liver reduction diet, dietary changes to begin now to improve diet quality and ease transition to bariatric diet .   Discussed stages of the bariatric diet after surgery as well as the importance of adequate protein and fluid intake.   Discussed post-op vitamin supplementation, as weill as importance of chewing foods thoroughly, avoiding fluids during meals, and eating at regular intervals  Summary:  Patient has begun making diet and lifestyle changes in effort to lose/ maintain weight and prepare for bariatric surgery.  He has solid support from family and friends.   He agrees to work on more consistent eating schedule and chewing foods more thoroughly prior to surgery.   Heunderstands the surgery requires lifelong commitment to diet and lifestyle change, and is motivated to follow the bariatric diet after surgery. From a nutrition standpoint, he is ready to proceed with the bariatric surgery program.    Plan:  Patient commits to returning for pre-op class prior to surgery.   He will plan to return for post-op RD visits beginning 2 weeks after surgery.

## 2019-05-02 NOTE — Patient Instructions (Signed)
   Practice chewing food more when eating, gradually work up to 20-30 chews for each bite of food.  Work on more regular schedule for meals and snacks. Have quick and healthy options on hand. If you include a protein drink, limit the frequency so you don't get burnout after surgery.

## 2019-05-07 ENCOUNTER — Other Ambulatory Visit: Payer: Self-pay

## 2019-05-07 ENCOUNTER — Ambulatory Visit (HOSPITAL_COMMUNITY)
Admission: RE | Admit: 2019-05-07 | Discharge: 2019-05-07 | Disposition: A | Discharge status: Home / Self Care | Payer: BC Managed Care – PPO | Source: Ambulatory Visit | Attending: Surgery | Admitting: Surgery

## 2019-05-07 DIAGNOSIS — K224 Dyskinesia of esophagus: Secondary | ICD-10-CM | POA: Diagnosis not present

## 2019-05-07 DIAGNOSIS — Z01818 Encounter for other preprocedural examination: Secondary | ICD-10-CM | POA: Diagnosis not present

## 2019-05-21 ENCOUNTER — Other Ambulatory Visit: Payer: Self-pay

## 2019-05-21 ENCOUNTER — Encounter: Payer: Self-pay | Admitting: Family Medicine

## 2019-05-21 ENCOUNTER — Ambulatory Visit (INDEPENDENT_AMBULATORY_CARE_PROVIDER_SITE_OTHER): Payer: BC Managed Care – PPO | Admitting: Family Medicine

## 2019-05-21 VITALS — BP 126/76 | HR 79 | Temp 97.3°F | Resp 18 | Ht 70.0 in | Wt 267.2 lb

## 2019-05-21 DIAGNOSIS — E1169 Type 2 diabetes mellitus with other specified complication: Secondary | ICD-10-CM

## 2019-05-21 DIAGNOSIS — G894 Chronic pain syndrome: Secondary | ICD-10-CM | POA: Diagnosis not present

## 2019-05-21 DIAGNOSIS — E1142 Type 2 diabetes mellitus with diabetic polyneuropathy: Secondary | ICD-10-CM | POA: Diagnosis not present

## 2019-05-21 DIAGNOSIS — Z6838 Body mass index (BMI) 38.0-38.9, adult: Secondary | ICD-10-CM

## 2019-05-21 DIAGNOSIS — Z8673 Personal history of transient ischemic attack (TIA), and cerebral infarction without residual deficits: Secondary | ICD-10-CM

## 2019-05-21 DIAGNOSIS — E785 Hyperlipidemia, unspecified: Secondary | ICD-10-CM

## 2019-05-21 DIAGNOSIS — E66812 Obesity, class 2: Secondary | ICD-10-CM

## 2019-05-21 DIAGNOSIS — F339 Major depressive disorder, recurrent, unspecified: Secondary | ICD-10-CM

## 2019-05-21 DIAGNOSIS — G4733 Obstructive sleep apnea (adult) (pediatric): Secondary | ICD-10-CM

## 2019-05-21 LAB — POCT GLYCOSYLATED HEMOGLOBIN (HGB A1C): Hemoglobin A1C: 6.7 % — AB (ref 4.0–5.6)

## 2019-05-21 NOTE — Patient Instructions (Signed)
Please return in 3-6 months for your annual complete physical; please come fasting and recheck diabetes.  Your diabetes is now controlled. Continue your current medications.  Good luck with your weight loss.   If you have any questions or concerns, please don't hesitate to send me a message via MyChart or call the office at [phone]. Thank you for visiting with [place] today! It's our pleasure caring for you.

## 2019-05-21 NOTE — Progress Notes (Signed)
Subjective  CC:  Chief Complaint  Patient presents with  . Diabetes    No new concerns at this time.     HPI: [name] is a 49 y.o. male who presents to the office today for follow up of diabetes and problems listed above in the chief complaint.   Diabetes follow up: 3 months ago we added farxiga to met. Doing well. Voiding more but no complications or AEs.  His diabetic control is reported as Improved. Only checks fastings intermittently: avg 120s.  He denies exertional CP or SOB or symptomatic hypoglycemia. He denies foot sores or paresthesias. He has sought out consultation from bariatrics: seriously contemplating weight loss surgery. imms up to date  HLD is at goal on statin; well tolerated.   H/o CVA w/o new neuro sxs. Ran out of asa but will restart once purchases more. No headaches.   No h/o HTN. Not on ace or arb w/ normal urine microalbumin testing in January.   Chronic pain syndrome: saw Dr. Vira Blanco, had epidural steroid injection and was started on seroquel and rexulti - no longer taking either due to side effects and weight gain. Hoping weight loss will help pain control and be able to increase activity. Remains on narcotics as needed.   OSA on cpap. Sleep remains a problem but more related to pain awakening him.   Wt Readings from Last 3 Encounters:  05/21/19 267 lb 3.2 oz (121.2 kg)  05/02/19 265 lb 6.4 oz (120.4 kg)  02/28/19 258 lb 12.8 oz (117.4 kg)    BP Readings from Last 3 Encounters:  05/21/19 126/76  02/28/19 130/90  02/27/19 (!) 152/90    Assessment  1. Type 2 diabetes mellitus with diabetic polyneuropathy, without long-term current use of insulin ([place])   2. Chronic pain syndrome   3. Hyperlipidemia associated with type 2 diabetes mellitus ([place]), on statin   4. History of CVA (cerebrovascular accident)   5. Major depression, recurrent, chronic (HCC)   6. Class 2 severe obesity due to excess calories with serious comorbidity and body mass  index (BMI) of 38.0 to 38.9 in adult ([place])   7. Obstructive sleep apnea      Plan   Diabetes is currently well controlled. Continue same meds. Congratulated.   HLD at goal  Cva: no new sxs or deficits. Restart asa  Mood is stable on effexor  Obesity: bariatrics eval ongoing  OSA  Low T: started conversation. At this time, will recheck at a later date after weight loss to better assess if sxs are possible related to low T. High risk med for this pt.   Follow up: Return in about 6 months (around 11/20/2019) for complete physical, follow up Diabetes.. Orders Placed This Encounter  Procedures  . POCT HgB A1C   No orders of the defined types were placed in this encounter.     Immunization History  Administered Date(s) Administered  . Influenza Split 09/13/2011  . Influenza Whole 11/18/2008  . Influenza,inj,Quad PF,6+ Mos 11/15/2012, 11/12/2013, 09/26/2016, 11/21/2017, 11/19/2018  . PFIZER SARS-COV-2 Vaccination 04/14/2019, 05/12/2019  . Pneumococcal Polysaccharide-23 12/22/2010  . Td 03/16/2009    Diabetes Related Lab Review: Lab Results  Component Value Date   HGBA1C 6.7 (A) 05/21/2019   HGBA1C 7.1 (H) 02/25/2019   HGBA1C 7.2 (H) 12/05/2017    Lab Results  Component Value Date   MICROALBUR <0.7 02/27/2019   Lab Results  Component Value Date   CREATININE 0.82 02/25/2019   BUN 10  02/25/2019   NA 138 02/25/2019   K 4.1 02/25/2019   CL 106 02/25/2019   CO2 25 02/25/2019   Lab Results  Component Value Date   CHOL 130 02/25/2019   CHOL 262 (H) 12/05/2017   CHOL 249 (H) 02/07/2014   Lab Results  Component Value Date   HDL 47.60 02/25/2019   HDL 52.00 12/05/2017   HDL 38.90 (L) 02/07/2014   Lab Results  Component Value Date   LDLCALC 69 02/25/2019   LDLCALC 182 (H) 12/05/2017   LDLCALC 182 (H) 02/07/2014   Lab Results  Component Value Date   TRIG 68.0 02/25/2019   TRIG 141.0 12/05/2017   TRIG 139.0 02/07/2014   Lab Results  Component Value Date    CHOLHDL 3 02/25/2019   CHOLHDL 5 12/05/2017   CHOLHDL 6 02/07/2014   Lab Results  Component Value Date   LDLDIRECT 148.1 11/04/2010   LDLDIRECT 207.7 02/08/2007   The 10-year ASCVD risk score Mikey Bussing DC Jr., et al., 2013) is: 8.2%   Values used to calculate the score:     Age: 34 years     Sex: Male     Is Non-Hispanic African American: Yes     Diabetic: Yes     Tobacco smoker: No     Systolic Blood Pressure: 701 mmHg     Is BP treated: No     HDL Cholesterol: 47.6 mg/dL     Total Cholesterol: 130 mg/dL I have reviewed the Cross, Fam and Soc history. Patient Active Problem List   Diagnosis Date Noted  . Class 2 severe obesity due to excess calories with serious comorbidity and body mass index (BMI) of 38.0 to 38.9 in adult [facility]) 03/29/2018    Priority: High  . Chronic post-traumatic stress disorder (PTSD) after military combat 06/03/2017    Priority: High  . Major depression, recurrent, chronic ([place]) 01/01/2017    Priority: High  . Chronic pain syndrome 09/03/2016    Priority: High  . Type 2 diabetes mellitus with diabetic polyneuropathy, without long-term current use of insulin ([place]) 12/22/2010    Priority: High    dxd in 2009   . Hyperlipidemia associated with type 2 diabetes mellitus ([place]), on statin 08/23/2006    Priority: High  . Obstructive sleep apnea 08/23/2006    Priority: High    NPSG 2006:  AHI 78/hr. Auto optimized to 13-14 cm.  Autoset 01/2011 to 13 cm.   . Chronic low back pain 08/23/2006    Priority: High    Using back brace.  Using as needed Percocet.    . Seizure disorder ([place]) 08/23/2006    Priority: High  . History of CVA (cerebrovascular accident) 08/23/2006    Priority: High  . Insomnia 07/15/2015    Priority: Medium  . BPH (benign prostatic hyperplasia) 11/15/2012    Priority: Medium  . Gout 08/23/2006    Priority: Medium  . Vitamin B 12 deficiency 12/09/2017    Priority: Low  . Allergic rhinitis 08/23/2006    Priority: Low     Generally controlled with Zyrtec.   . Inflammatory spondylopathy of lumbar region ([place]) 09/02/2017  . Chronic right shoulder pain 06/03/2017  . Trigger thumb, right thumb 05/29/2015  . Carpal tunnel syndrome on right 02/06/2015  . Cervical spondylosis without myelopathy 02/06/2015  . Lumbar facet arthropathy 08/02/2011  . Lumbar spondylosis 08/02/2011  . Chronic pain of both knees 12/08/2008    Severe. Daily pain 8/10, down to 7/10 with antiinflammatory. Interfering with life.  Limited exercise due to pain.        Social History: Patient  reports that he quit smoking about 28 years ago. His smoking use included cigarettes. He quit after 4.00 years of use. He has never used smokeless tobacco. He reports that he does not drink alcohol or use drugs.  Review of Systems: Ophthalmic: negative for eye pain, loss of vision or double vision Cardiovascular: negative for chest pain Respiratory: negative for SOB or persistent cough Gastrointestinal: negative for abdominal pain Genitourinary: negative for dysuria or gross hematuria MSK: negative for foot lesions Neurologic: negative for weakness or gait disturbance  Objective  Vitals: BP 126/76   Pulse 79   Temp (!) 97.3 F (36.3 C) (Temporal)   Resp 18   Ht _0  (1.778 m)   Wt 267 lb 3.2 oz (121.2 kg)   SpO2 100%   BMI 38.34 kg/m  General: well appearing, no acute distress  Psych:  Alert and oriented, normal mood and affect HEENT:  Normocephalic, atraumatic, moist mucous membranes, supple neck  Cardiovascular:  Nl S1 and S2, RRR without murmur, gallop or rub. no edema Respiratory:  Good breath sounds bilaterally, CTAB with normal effort, no rales Gastrointestinal: normal BS, soft, nontender Skin:  Warm, no rashes Neurologic:   Mental status is normal. normal gait Foot exam: no erythema, pallor, or cyanosis visible nl proprioception and sensation to monofilament testing bilaterally, +2 distal pulses bilaterally    Diabetic  education: ongoing education regarding chronic disease management for diabetes was given today. We continue to reinforce the ABC's of diabetic management: A1c (<7 or 8 dependent upon patient), tight blood pressure control, and cholesterol management with goal LDL < 100 minimally. We discuss diet strategies, exercise recommendations, medication options and possible side effects. At each visit, we review recommended immunizations and preventive care recommendations for diabetics and stress that good diabetic control can prevent other problems. See below for this patient's data.    Commons side effects, risks, benefits, and alternatives for medications and treatment plan prescribed today were discussed, and the patient expressed understanding of the given instructions. Patient is instructed to call or message via MyChart if he/she has any questions or concerns regarding our treatment plan. No barriers to understanding were identified. We discussed Red Flag symptoms and signs in detail. Patient expressed understanding regarding what to do in case of urgent or emergency type symptoms.   Medication list was reconciled, printed and provided to the patient in AVS. Patient instructions and summary information was reviewed with the patient as documented in the AVS. This note was prepared with assistance of Dragon voice recognition software. Occasional wrong-word or sound-a-like substitutions may have occurred due to the inherent limitations of voice recognition software  This visit occurred during the SARS-CoV-2 public health emergency.  Safety protocols were in place, including screening questions prior to the visit, additional usage of staff PPE, and extensive cleaning of exam room while observing appropriate contact time as indicated for disinfecting solutions.

## 2019-05-27 DIAGNOSIS — G4733 Obstructive sleep apnea (adult) (pediatric): Secondary | ICD-10-CM | POA: Diagnosis not present

## 2019-05-28 ENCOUNTER — Ambulatory Visit: Payer: BC Managed Care – PPO | Admitting: Family Medicine

## 2019-06-05 ENCOUNTER — Ambulatory Visit (INDEPENDENT_AMBULATORY_CARE_PROVIDER_SITE_OTHER): Payer: BC Managed Care – PPO | Admitting: Psychology

## 2019-06-05 DIAGNOSIS — F509 Eating disorder, unspecified: Secondary | ICD-10-CM | POA: Diagnosis not present

## 2019-06-18 ENCOUNTER — Ambulatory Visit: Payer: BC Managed Care – PPO | Admitting: Psychology

## 2019-06-21 ENCOUNTER — Other Ambulatory Visit: Payer: Self-pay

## 2019-06-21 ENCOUNTER — Encounter: Payer: BC Managed Care – PPO | Attending: Surgery | Admitting: Dietician

## 2019-06-21 DIAGNOSIS — Z6838 Body mass index (BMI) 38.0-38.9, adult: Secondary | ICD-10-CM

## 2019-06-21 NOTE — Progress Notes (Signed)
Pre-Operative Nutrition Class:  Appt start time: 0900   End time:  1100.  Patient was seen on Jun 21, 2019 for Pre-Operative Bariatric Surgery Education at [facility] at [facility].   Surgery date: TBD Surgery type: RYGB  [facility] weight at [facility]: 265.4 lbs  Weight today: 265.9 lbs  InBody  BODY COMP RESULTS    BMI (kg/m^2) 38.2  Fat Mass (lbs) 95.3  Dry Lean Mass (lbs) 45.6  Total Body Water (lbs) 125.0   Samples given per MNT protocol. Patient educated on appropriate usage:  Lot number Expiration date  Celebrate Vitamins RNY pack:  Calcium soft chews cherry    1070   10/2020  Calcium soft chews watermelon 0309 05/2020  Calcium soft chews straw-ban cream 1011 07/2020  Calcium soft chews caf mocha 6578 05/2020  Calcium soft chews blackberry 0426 03/2020  Calcium soft chews orange 0275 03/2020  Calcium soft chews caramel 0349 07/2020  Calcium soft chews chocolate 1010 07/2020  Calcium soft chew Isl. fruit 0338 07/2020  Calcium soft chew lemon cream 1032 08/2020    Calcium (tablet) 500 cherry tart [phone] 02/2020  B12 Quick-melt Cherry   [phone] 01/2021       Iron soft chews 4m cherry burst 240102V206/2022  Iron soft chews 679mtwisted citrus 2053664Q006/2022  Iron 45 tab grape 00347-42598/2022      Multivitamin soft chews very cherry 21011B6 07/2020  Multivitamin soft chews strawberry 2156387F67/2022  Multi complete 45 watermelon 00433-29514/2022  Multivitamin grape [phone] 05/2020      Celebrate Vitamins bulk packs:     Calcium soft chews watermelon 0309 05/2020  Calcium soft chew Isl. fruit 0338 07/2020  Calcium soft chew lemon cream 1032 08/2020      Multivitamin soft chews very cherry 21011B6 07/2020  Multivitamin soft chews strawberry 2188416S07/2022    Premier Beverages:  Clear protein drink    Vanilla shake 036301600/10/2019  Chocolate shake 031093238/29/2021         Unjury products:  Vanilla shake    [id number] 05/27/2020  Chocolate shake 104270W2B7S4/28/2022    Unflavored powder 122831511/2022  Chocolate powder 527616073/2022  Chicken soup powder 123710621/2022    The following the learning objectives were met by the patient during this course:  Identify Pre-Op Dietary Goals and will begin 2 weeks pre-operatively  Identify appropriate sources of fluids and proteins   State protein recommendations and appropriate sources pre and post-operatively  Identify Post-Operative Dietary Goals and will follow for 2 weeks post-operatively  Identify appropriate multivitamin and calcium sources  Describe the need for physical activity post-operatively and will follow MD recommendations  State when to call healthcare provider regarding medication questions or post-operative complications  Handouts given during class include:  Pre-Op Bariatric Surgery Diet Handout  Protein Shake Handout  Post-Op Bariatric Surgery Nutrition Handout  BELT Program Information Flyer  Support Group Information Flyer  [facility] Bariatric Supplements Price List  Follow-Up Plan: [facility] post operatively for diet advancement per MD.

## 2019-08-26 DIAGNOSIS — K219 Gastro-esophageal reflux disease without esophagitis: Secondary | ICD-10-CM | POA: Diagnosis not present

## 2019-08-26 DIAGNOSIS — F329 Major depressive disorder, single episode, unspecified: Secondary | ICD-10-CM | POA: Diagnosis not present

## 2019-08-26 DIAGNOSIS — E119 Type 2 diabetes mellitus without complications: Secondary | ICD-10-CM | POA: Diagnosis not present

## 2019-08-26 DIAGNOSIS — G4733 Obstructive sleep apnea (adult) (pediatric): Secondary | ICD-10-CM | POA: Diagnosis not present

## 2019-08-26 DIAGNOSIS — G4730 Sleep apnea, unspecified: Secondary | ICD-10-CM | POA: Diagnosis not present

## 2019-09-12 ENCOUNTER — Encounter: Payer: Self-pay | Admitting: Family Medicine

## 2019-10-17 ENCOUNTER — Ambulatory Visit (INDEPENDENT_AMBULATORY_CARE_PROVIDER_SITE_OTHER): Payer: BC Managed Care – PPO | Admitting: Psychology

## 2019-10-17 DIAGNOSIS — F509 Eating disorder, unspecified: Secondary | ICD-10-CM | POA: Diagnosis not present

## 2019-10-31 ENCOUNTER — Telehealth: Payer: Self-pay | Admitting: Skilled Nursing Facility1

## 2019-10-31 NOTE — Telephone Encounter (Signed)
Dietitian called pt to assess their understanding of the pre-op nutrition recommendations through the teach back method to ensure the pts knowledge readiness in preparation for surgery.     

## 2019-11-20 ENCOUNTER — Encounter: Payer: BC Managed Care – PPO | Admitting: Family Medicine

## 2019-11-28 NOTE — Progress Notes (Addendum)
COVID Vaccine Completed: x2 Date COVID Vaccine completed:  04-14-19 & 05-12-19 COVID vaccine manufacturer: Pfizer    Moderna   [employer]   PCP - Asencion Partridge, MD Cardiologist -  Nanetta Batty, MD.  Last OV 03-05-15  Chest x-ray -  EKG - 05-07-19 in Epic Stress Test - 03-10-15 in Epic ECHO -  Cardiac Cath -  Pacemaker/ICD device last checked:  Sleep Study - 11-04-2004 in Epic. + OSA CPAP - Yes 13 cm  Fasting Blood Sugar - 80-150 Checks Blood Sugar - every two weeks  Blood Thinner Instructions: Aspirin Instructions: Last Dose:  Anesthesia review:  OSA, hx of chest pain, CVA 2001, Seizure disorder (last seizure 2001)  Patient denies shortness of breath, fever, cough and chest pain at PAT appointment   Patient verbalized understanding of instructions that were given to them at the PAT appointment. Patient was also instructed that they will need to review over the PAT instructions again at home before surgery.

## 2019-11-28 NOTE — Patient Instructions (Addendum)
DUE TO COVID-19 ONLY ONE VISITOR IS ALLOWED TO COME WITH YOU AND STAY IN THE WAITING ROOM ONLY DURING PRE OP AND PROCEDURE.   IF YOU WILL BE ADMITTED INTO THE HOSPITAL YOU ARE ALLOWED ONE SUPPORT PERSON DURING VISITATION HOURS ONLY (10AM -8PM)   . The support person may change daily. . The support person must pass our screening, gel in and out, and wear a mask at all times, including in the patient's room. . Patients must also wear a mask when staff or their support person are in the room.         Your procedure is scheduled on:  Tuesday, 12-03-19   Report to [facility] Main  Entrance   Report to Short Stay at 5:30 AM   [facility])    Call this number if you have problems the morning of surgery [phone]   Do not eat food :After Midnight.   May have liquids until 4:30 AM  day of surgery  CLEAR LIQUID DIET  Foods Allowed                                                                     Foods Excluded  Water, Black Coffee and tea, regular and decaf              liquids that you cannot  Plain Jell-O in any flavor  (No red)                                     see through such as: Fruit ices (not with fruit pulp)                                      milk, soups, orange juice              Iced Popsicles (No red)                                      All solid food                                   Apple juices Sports drinks like Gatorade (No red) Lightly seasoned clear broth or consume(fat free) Sugar, honey syrup    Oral Hygiene is also important to reduce your risk of infection.                                    Remember - BRUSH YOUR TEETH THE MORNING OF SURGERY WITH YOUR REGULAR TOOTHPASTE   Do NOT smoke after Midnight   Take these medicines the morning of surgery with A SIP OF WATER: Certirizine, Famotidine, Rosuvastatin, Venlafaxine  How to Manage Your Diabetes Before and After Surgery  Why is it important to control my blood sugar before and after  surgery? . Improving blood sugar levels before and after surgery helps  healing and can limit problems. . A way of improving blood sugar control is eating a healthy diet by: o  Eating less sugar and carbohydrates o  Increasing activity/exercise o  Talking with your doctor about reaching your blood sugar goals . High blood sugars (greater than 180 mg/dL) can raise your risk of infections and slow your recovery, so you will need to focus on controlling your diabetes during the weeks before surgery. . Make sure that the doctor who takes care of your diabetes knows about your planned surgery including the date and location.  How do I manage my blood sugar before surgery? . Check your blood sugar at least 4 times a day, starting 2 days before surgery, to make sure that the level is not too high or low. o Check your blood sugar the morning of your surgery when you wake up and every 2 hours until you get to the Short Stay unit. . If your blood sugar is less than 70 mg/dL, you will need to treat for low blood sugar: o Do not take insulin. o Treat a low blood sugar (less than 70 mg/dL) with  cup of clear juice (cranberry or apple), 4 glucose tablets, OR glucose gel. o Recheck blood sugar in 15 minutes after treatment (to make sure it is greater than 70 mg/dL). If your blood sugar is not greater than 70 mg/dL on recheck, call [phone] for further instructions. . Report your blood sugar to the short stay nurse when you get to Short Stay.  . If you are admitted to the hospital after surgery: o Your blood sugar will be checked by the staff and you will probably be given insulin after surgery (instead of oral diabetes medicines) to make sure you have good blood sugar levels. o The goal for blood sugar control after surgery is 80-180 mg/dL.   WHAT DO I DO ABOUT MY DIABETES MEDICATION?  Marland Kitchen Do not take oral diabetes medicines (pills) the morning of surgery.  . THE DAY BEFORE SURGERY:  Do not take  [place].               Take Metformin as prescribed.       . THE MORNING OF SURGERY:  Do not take Marcelline Deist     Do not take Metformin.   Reviewed and Endorsed by [facility] Patient Education Committee, August 2015                               You may not have any metal on your body including  jewelry, and body piercings             Do not wear  lotions, powders, perfumes/cologne, or deodorant             Men may shave face and neck.   Do not bring valuables to the hospital. [facility] IS NOT  RESPONSIBLE   FOR VALUABLES.   Contacts, dentures or bridgework may not be worn into surgery.   Bring small overnight bag day of surgery.                 Please read over the following fact sheets you were given: IF YOU HAVE QUESTIONS ABOUT YOUR PRE OP INSTRUCTIONS PLEASE CALL [phone]   [place] - Preparing for Surgery Before surgery, you can play an important role.  Because skin is not sterile, your skin needs to be as free of germs  as possible.  You can reduce the number of germs on your skin by washing with CHG (chlorahexidine gluconate) soap before surgery.  CHG is an antiseptic cleaner which kills germs and bonds with the skin to continue killing germs even after washing. Please DO NOT use if you have an allergy to CHG or antibacterial soaps.  If your skin becomes reddened/irritated stop using the CHG and inform your nurse when you arrive at Short Stay. Do not shave (including legs and underarms) for at least 48 hours prior to the first CHG shower.  You may shave your face/neck.  Please follow these instructions carefully:  1.  Shower with CHG Soap the night before surgery and the  morning of surgery.  2.  If you choose to wash your hair, wash your hair first as usual with your normal  shampoo.  3.  After you shampoo, rinse your hair and body thoroughly to remove the shampoo.                             4.  Use CHG as you would any other liquid soap.  You can apply chg directly to  the skin and wash.  Gently with a scrungie or clean washcloth.  5.  Apply the CHG Soap to your body ONLY FROM THE NECK DOWN.   Do   not use on face/ open                           Wound or open sores. Avoid contact with eyes, ears mouth and   genitals (private parts).                       Wash face,  Genitals (private parts) with your normal soap.             6.  Wash thoroughly, paying special attention to the area where your    surgery  will be performed.  7.  Thoroughly rinse your body with warm water from the neck down.  8.  DO NOT shower/wash with your normal soap after using and rinsing off the CHG Soap.                9.  Pat yourself dry with a clean towel.            10.  Wear clean pajamas.            11.  Place clean sheets on your bed the night of your first shower and do not  sleep with pets. Day of Surgery : Do not apply any lotions/deodorants the morning of surgery.  Please wear clean clothes to the hospital/surgery center.  FAILURE TO FOLLOW THESE INSTRUCTIONS MAY RESULT IN THE CANCELLATION OF YOUR SURGERY  PATIENT SIGNATURE_________________________________  NURSE SIGNATURE__________________________________  ________________________________________________________________________   Rogelia MireIncentive Spirometer  An incentive spirometer is a tool that can help keep your lungs clear and active. This tool measures how well you are filling your lungs with each breath. Taking long deep breaths may help reverse or decrease the chance of developing breathing (pulmonary) problems (especially infection) following:  A long period of time when you are unable to move or be active. BEFORE THE PROCEDURE   If the spirometer includes an indicator to show your best effort, your nurse or respiratory therapist will set it to a desired goal.  If possible, sit up straight  or lean slightly forward. Try not to slouch.  Hold the incentive spirometer in an upright position. INSTRUCTIONS FOR USE   1. Sit on the edge of your bed if possible, or sit up as far as you can in bed or on a chair. 2. Hold the incentive spirometer in an upright position. 3. Breathe out normally. 4. Place the mouthpiece in your mouth and seal your lips tightly around it. 5. Breathe in slowly and as deeply as possible, raising the piston or the ball toward the top of the column. 6. Hold your breath for 3-5 seconds or for as long as possible. Allow the piston or ball to fall to the bottom of the column. 7. Remove the mouthpiece from your mouth and breathe out normally. 8. Rest for a few seconds and repeat Steps 1 through 7 at least 10 times every 1-2 hours when you are awake. Take your time and take a few normal breaths between deep breaths. 9. The spirometer may include an indicator to show your best effort. Use the indicator as a goal to work toward during each repetition. 10. After each set of 10 deep breaths, practice coughing to be sure your lungs are clear. If you have an incision (the cut made at the time of surgery), support your incision when coughing by placing a pillow or rolled up towels firmly against it. Once you are able to get out of bed, walk around indoors and cough well. You may stop using the incentive spirometer when instructed by your caregiver.  RISKS AND COMPLICATIONS  Take your time so you do not get dizzy or light-headed.  If you are in pain, you may need to take or ask for pain medication before doing incentive spirometry. It is harder to take a deep breath if you are having pain. AFTER USE  Rest and breathe slowly and easily.  It can be helpful to keep track of a log of your progress. Your caregiver can provide you with a simple table to help with this. If you are using the spirometer at home, follow these instructions: SEEK MEDICAL CARE IF:   You are having difficultly using the spirometer.  You have trouble using the spirometer as often as instructed.  Your pain medication is  not giving enough relief while using the spirometer.  You develop fever of 100.5 F (38.1 C) or higher. SEEK IMMEDIATE MEDICAL CARE IF:   You cough up bloody sputum that had not been present before.  You develop fever of 102 F (38.9 C) or greater.  You develop worsening pain at or near the incision site. MAKE SURE YOU:   Understand these instructions.  Will watch your condition.  Will get help right away if you are not doing well or get worse. Document Released: 05/30/2006 Document Revised: 04/11/2011 Document Reviewed: 07/31/2006 [employer] Patient Information 2014 [employer], [place].   ________________________________________________________________________    WHAT IS A BLOOD TRANSFUSION? Blood Transfusion Information  A transfusion is the replacement of blood or some of its parts. Blood is made up of multiple cells which provide different functions.  Red blood cells carry oxygen and are used for blood loss replacement.  White blood cells fight against infection.  Platelets control bleeding.  Plasma helps clot blood.  Other blood products are available for specialized needs, such as hemophilia or other clotting disorders. BEFORE THE TRANSFUSION  Who gives blood for transfusions?   Healthy volunteers who are fully evaluated to make sure their blood is safe. This is  blood bank blood. Transfusion therapy is the safest it has ever been in the practice of medicine. Before blood is taken from a donor, a complete history is taken to make sure that person has no history of diseases nor engages in risky social behavior (examples are intravenous drug use or sexual activity with multiple partners). The donor's travel history is screened to minimize risk of transmitting infections, such as malaria. The donated blood is tested for signs of infectious diseases, such as HIV and hepatitis. The blood is then tested to be sure it is compatible with you in order to minimize the chance of a  transfusion reaction. If you or a relative donates blood, this is often done in anticipation of surgery and is not appropriate for emergency situations. It takes many days to process the donated blood. RISKS AND COMPLICATIONS Although transfusion therapy is very safe and saves many lives, the main dangers of transfusion include:   Getting an infectious disease.  Developing a transfusion reaction. This is an allergic reaction to something in the blood you were given. Every precaution is taken to prevent this. The decision to have a blood transfusion has been considered carefully by your caregiver before blood is given. Blood is not given unless the benefits outweigh the risks. AFTER THE TRANSFUSION  Right after receiving a blood transfusion, you will usually feel much better and more energetic. This is especially true if your red blood cells have gotten low (anemic). The transfusion raises the level of the red blood cells which carry oxygen, and this usually causes an energy increase.  The nurse administering the transfusion will monitor you carefully for complications. HOME CARE INSTRUCTIONS  No special instructions are needed after a transfusion. You may find your energy is better. Speak with your caregiver about any limitations on activity for underlying diseases you may have. SEEK MEDICAL CARE IF:   Your condition is not improving after your transfusion.  You develop redness or irritation at the intravenous (IV) site. SEEK IMMEDIATE MEDICAL CARE IF:  Any of the following symptoms occur over the next 12 hours:  Shaking chills.  You have a temperature by mouth above 102 F (38.9 C), not controlled by medicine.  Chest, back, or muscle pain.  People around you feel you are not acting correctly or are confused.  Shortness of breath or difficulty breathing.  Dizziness and fainting.  You get a rash or develop hives.  You have a decrease in urine output.  Your urine turns a dark  color or changes to pink, red, or brown. Any of the following symptoms occur over the next 10 days:  You have a temperature by mouth above 102 F (38.9 C), not controlled by medicine.  Shortness of breath.  Weakness after normal activity.  The white part of the eye turns yellow (jaundice).  You have a decrease in the amount of urine or are urinating less often.  Your urine turns a dark color or changes to pink, red, or brown. Document Released: 01/15/2000 Document Revised: 04/11/2011 Document Reviewed: 09/03/2007 [facility] Patient Information 2014 [place], [place].  _______________________________________________________________________

## 2019-11-29 ENCOUNTER — Other Ambulatory Visit (HOSPITAL_COMMUNITY)
Admission: RE | Admit: 2019-11-29 | Discharge: 2019-11-29 | Disposition: A | Discharge status: Home / Self Care | Payer: BC Managed Care – PPO | Source: Ambulatory Visit | Attending: Surgery | Admitting: Surgery

## 2019-11-29 ENCOUNTER — Encounter (HOSPITAL_COMMUNITY)
Admission: RE | Admit: 2019-11-29 | Discharge: 2019-11-29 | Disposition: A | Discharge status: Home / Self Care | Payer: BC Managed Care – PPO | Source: Ambulatory Visit | Attending: Surgery | Admitting: Surgery

## 2019-11-29 ENCOUNTER — Encounter (HOSPITAL_COMMUNITY): Payer: Self-pay

## 2019-11-29 ENCOUNTER — Other Ambulatory Visit: Payer: Self-pay

## 2019-11-29 DIAGNOSIS — Z01812 Encounter for preprocedural laboratory examination: Secondary | ICD-10-CM | POA: Diagnosis not present

## 2019-11-29 DIAGNOSIS — Z20822 Contact with and (suspected) exposure to covid-19: Secondary | ICD-10-CM | POA: Diagnosis not present

## 2019-11-29 DIAGNOSIS — E118 Type 2 diabetes mellitus with unspecified complications: Secondary | ICD-10-CM | POA: Insufficient documentation

## 2019-11-29 HISTORY — DX: Other complications of anesthesia, initial encounter: T88.59XA

## 2019-11-29 HISTORY — DX: Depression, unspecified: F32.A

## 2019-11-29 HISTORY — DX: Anxiety disorder, unspecified: F41.9

## 2019-11-29 HISTORY — DX: Unspecified osteoarthritis, unspecified site: M19.90

## 2019-11-29 LAB — CBC WITH DIFFERENTIAL/PLATELET
Abs Immature Granulocytes: 0.01 10*3/uL (ref 0.00–0.07)
Basophils Absolute: 0 10*3/uL (ref 0.0–0.1)
Basophils Relative: 1 %
Eosinophils Absolute: 0.1 10*3/uL (ref 0.0–0.5)
Eosinophils Relative: 1 %
HCT: 47.8 % (ref 39.0–52.0)
Hemoglobin: 15.3 g/dL (ref 13.0–17.0)
Immature Granulocytes: 0 %
Lymphocytes Relative: 38 %
Lymphs Abs: 2.4 10*3/uL (ref 0.7–4.0)
MCH: 25.1 pg — ABNORMAL LOW (ref 26.0–34.0)
MCHC: 32 g/dL (ref 30.0–36.0)
MCV: 78.4 fL — ABNORMAL LOW (ref 80.0–100.0)
Monocytes Absolute: 0.5 10*3/uL (ref 0.1–1.0)
Monocytes Relative: 8 %
Neutro Abs: 3.2 10*3/uL (ref 1.7–7.7)
Neutrophils Relative %: 52 %
Platelets: 277 10*3/uL (ref 150–400)
RBC: 6.1 MIL/uL — ABNORMAL HIGH (ref 4.22–5.81)
RDW: 16.9 % — ABNORMAL HIGH (ref 11.5–15.5)
WBC: 6.2 10*3/uL (ref 4.0–10.5)
nRBC: 0 % (ref 0.0–0.2)

## 2019-11-29 LAB — COMPREHENSIVE METABOLIC PANEL
ALT: 18 U/L (ref 0–44)
AST: 23 U/L (ref 15–41)
Albumin: 4.2 g/dL (ref 3.5–5.0)
Alkaline Phosphatase: 45 U/L (ref 38–126)
Anion gap: 11 (ref 5–15)
BUN: 16 mg/dL (ref 6–20)
CO2: 25 mmol/L (ref 22–32)
Calcium: 9.4 mg/dL (ref 8.9–10.3)
Chloride: 103 mmol/L (ref 98–111)
Creatinine, Ser: 0.96 mg/dL (ref 0.61–1.24)
GFR, Estimated: >60 mL/min (ref >60)
Glucose, Bld: 96 mg/dL (ref 70–99)
Potassium: 4.2 mmol/L (ref 3.5–5.1)
Sodium: 139 mmol/L (ref 135–145)
Total Bilirubin: 1.1 mg/dL (ref 0.3–1.2)
Total Protein: 8.2 g/dL — ABNORMAL HIGH (ref 6.5–8.1)

## 2019-11-29 LAB — SARS CORONAVIRUS 2 (TAT 6-24 HRS): SARS Coronavirus 2: NEGATIVE

## 2019-11-29 LAB — GLUCOSE, CAPILLARY: Glucose-Capillary: 113 mg/dL — ABNORMAL HIGH (ref 70–99)

## 2019-11-30 ENCOUNTER — Encounter: Payer: Self-pay | Admitting: Family Medicine

## 2019-11-30 LAB — HEMOGLOBIN A1C
Hgb A1c MFr Bld: 7.2 % — ABNORMAL HIGH (ref 4.8–5.6)
Mean Plasma Glucose: 160 mg/dL

## 2019-12-01 NOTE — H&P (Signed)
Chief Complaint:  Obesity with multiple comorbidities  History of Present Illness:  [name] is an 49 y.o. male who is a disabled veteran with DM, OSA, arthritis, GERD, hypercholesterol, seizures and a hx of a stroke.  He is followed by Dr. Angelina Pih.  He and his wife, [name] have been seen in the office and informed consent obtained regarding the risks and benefits of this procedure.  Questions answered and he is ready for surgery.    Past Medical History:  Diagnosis Date  . Adrenal abnormality (HCC)   . Allergy   . Anxiety   . Arthritis   . Atypical chest pain   . BPH (benign prostatic hypertrophy)   . Complication of anesthesia    Hard time waking up after neck surgery  . Depression   . DM (diabetes mellitus) (HCC)   . GERD (gastroesophageal reflux disease)   . Gout   . H/O: CVA (cardiovascular accident)   . Hyperlipidemia   . Knee pain, bilateral   . LBP (low back pain)   . OSA (obstructive sleep apnea)   . Plantar fasciitis   . Seizure disorder [facility])     Past Surgical History:  Procedure Laterality Date  . CARPAL TUNNEL RELEASE    . ENDOSCOPIC PLANTAR FASCIOTOMY    . HEEL SPUR SURGERY Left   . NECK SURGERY     Titanium Plate in Vertebrae  . SHOULDER SURGERY Right   . TRIGGER FINGER RELEASE Right 05/14/2015   Procedure: RIGHT THUMB TRIGGER RELEASE ;  Surgeon: Betha Loa, MD;  Location: [facility];  Service: Orthopedics;  Laterality: Right;    No current facility-administered medications for this encounter.   Current Outpatient Medications  Medication Sig Dispense Refill  . acetaminophen (TYLENOL) 500 MG tablet Take 1,000 mg by mouth every 6 (six) hours as needed for mild pain or moderate pain.     . cetirizine (ZYRTEC) 10 MG tablet Take 10 mg by mouth daily as needed for allergies.     . Cholecalciferol (VITAMIN D3) 125 MCG (5000 UT) CAPS Take 5,000 Units by mouth daily.     . cyclobenzaprine (FLEXERIL) 10 MG tablet Take 10 mg by mouth  3 (three) times daily.    . dapagliflozin propanediol (FARXIGA) 10 MG TABS tablet Take 10 mg by mouth daily. 90 tablet 3  . diclofenac sodium (VOLTAREN) 1 % GEL Apply 2 g topically 2 (two) times daily.    . famotidine (PEPCID) 20 MG tablet Take 20 mg by mouth daily as needed for heartburn or indigestion.  30 tablet 0  . fluticasone (FLONASE) 50 MCG/ACT nasal spray Place 2 sprays into both nostrils daily as needed for allergies or rhinitis.     Marland Kitchen ibuprofen (ADVIL) 600 MG tablet Take 600 mg by mouth every 6 (six) hours as needed for mild pain or moderate pain.    . metFORMIN (GLUCOPHAGE) 500 MG tablet TAKE 2 TABLETS BY MOUTH TWICE A DAY WITH A MEAL (Patient taking differently: Take 1,000 mg by mouth 2 (two) times daily with a meal. ) 360 tablet 2  . Oral Electrolytes (EMERGEN-C ELECTRO MIX) PACK Take 1 Package by mouth daily.    Marland Kitchen oxyCODONE-acetaminophen (PERCOCET) 10-325 MG tablet Take 1 tablet by mouth every 6 (six) hours as needed for pain. 120 tablet 0  . rosuvastatin (CRESTOR) 20 MG tablet TAKE 1 TABLET BY MOUTH EVERY DAY (Patient taking differently: Take 40 mg by mouth daily. ) 90 tablet 2  . sildenafil (  VIAGRA) 100 MG tablet Take 100 mg by mouth as needed for erectile dysfunction (Take one-half tablet by mouth as instructed (take 1 hour prior to sexual activity *do not exceed 1 dose per 24 hour period*)).    Marland Kitchen. traZODone (DESYREL) 100 MG tablet Take 200 mg by mouth at bedtime as needed for sleep.   0  . venlafaxine XR (EFFEXOR-XR) 150 MG 24 hr capsule Take 150 mg by mouth 2 (two) times daily.     . vitamin B-12 (CYANOCOBALAMIN) 100 MCG tablet Take 1 tablet (100 mcg total) by mouth daily. (Patient taking differently: Take 5,000 mcg by mouth daily. )    . aspirin EC 81 MG tablet Take 1 tablet (81 mg total) by mouth daily. (Patient not taking: Reported on 05/02/2019) 30 tablet 0   Atorvastatin, Tizanidine, and Cymbalta [duloxetine hcl] Family History  Problem Relation Age of Onset  . Diabetes  Father   . Hypertension Father   . Heart attack Maternal Grandfather   . Cancer Maternal Uncle   . Allergies Daughter    Social History:   reports that he quit smoking about 28 years ago. His smoking use included cigarettes. He quit after 4.00 years of use. He has never used smokeless tobacco. He reports that he does not drink alcohol and does not use drugs.   REVIEW OF SYSTEMS : Negative except for see problem list  Physical Exam:   HT is 69 in; weight is 263 and BMI ~ 39 Gen:  WDWN AAM NAD  Neurological: Alert and oriented to person, place, and time. Motor and sensory function is grossly intact  Head: Normocephalic and atraumatic.  Eyes: Conjunctivae are normal. Pupils are equal, round, and reactive to light. No scleral icterus.  Neck: Normal range of motion. Neck supple. No tracheal deviation or thyromegaly present.  Cardiovascular:  SR without murmurs or gallops.  No carotid bruits Breast:  Not examined Respiratory: Effort normal.  No respiratory distress. No chest wall tenderness. Breath sounds normal.  No wheezes, rales or rhonchi.  Abdomen:  Obese with no prior surgery GU:  Not examined Musculoskeletal: Normal range of motion. Extremities are nontender. No cyanosis, edema or clubbing noted-He does have some issues with athritis.  No history of DVT.   Lymphadenopathy: No cervical, preauricular, postauricular or axillary adenopathy is present Skin: Skin is warm and dry. No rash noted. No diaphoresis. No erythema. No pallor. Pscyh: Normal mood and affect. Behavior is normal. Judgment and thought content normal.   LABORATORY RESULTS: Results for orders placed or performed during the hospital encounter of 11/29/19 (from the past 48 hour(s))  CBC WITH DIFFERENTIAL     Status: Abnormal   Collection Time: 11/29/19  1:43 PM  Result Value Ref Range   WBC 6.2 4.0 - 10.5 K/uL   RBC 6.10 (H) 4.22 - 5.81 MIL/uL   Hemoglobin 15.3 13.0 - 17.0 g/dL   HCT 40.947.8 39 - 52 %   MCV 78.4 (L) 80.0  - 100.0 fL   MCH 25.1 (L) 26.0 - 34.0 pg   MCHC 32.0 30.0 - 36.0 g/dL   RDW 81.116.9 (H) 91.411.5 - 78.215.5 %   Platelets 277 150 - 400 K/uL   nRBC 0.0 0.0 - 0.2 %   Neutrophils Relative % 52 %   Neutro Abs 3.2 1.7 - 7.7 K/uL   Lymphocytes Relative 38 %   Lymphs Abs 2.4 0.7 - 4.0 K/uL   Monocytes Relative 8 %   Monocytes Absolute 0.5 0.1 - 1.0 K/uL  Eosinophils Relative 1 %   Eosinophils Absolute 0.1 0.0 - 0.5 K/uL   Basophils Relative 1 %   Basophils Absolute 0.0 0.0 - 0.1 K/uL   Immature Granulocytes 0 %   Abs Immature Granulocytes 0.01 0.00 - 0.07 K/uL    Comment: Performed at [facility], [address] [place], [place] [address]  Comprehensive metabolic panel     Status: Abnormal   Collection Time: 11/29/19  1:43 PM  Result Value Ref Range   Sodium 139 135 - 145 mmol/L   Potassium 4.2 3.5 - 5.1 mmol/L   Chloride 103 98 - 111 mmol/L   CO2 25 22 - 32 mmol/L   Glucose, Bld 96 70 - 99 mg/dL    Comment: Glucose reference range applies only to samples taken after fasting for at least 8 hours.   BUN 16 6 - 20 mg/dL   Creatinine, Ser 9.93 0.61 - 1.24 mg/dL   Calcium 9.4 8.9 - 57.0 mg/dL   Total Protein 8.2 (H) 6.5 - 8.1 g/dL   Albumin 4.2 3.5 - 5.0 g/dL   AST 23 15 - 41 U/L   ALT 18 0 - 44 U/L   Alkaline Phosphatase 45 38 - 126 U/L   Total Bilirubin 1.1 0.3 - 1.2 mg/dL   GFR, Estimated >17 >79 mL/min    Comment: (NOTE) Calculated using the CKD-EPI Creatinine Equation (2021)    Anion gap 11 5 - 15    Comment: Performed at [facility], [address] [place], [place] [address]  Type and screen [facility]     Status: None   Collection Time: 11/29/19  1:43 PM  Result Value Ref Range   ABO/RH(D) O POS    Antibody Screen NEG    Sample Expiration 12/13/2019,2359    Extend sample reason      NO TRANSFUSIONS OR PREGNANCY IN THE PAST 3 MONTHS Performed at [facility], [address] [place], [place]  [address]   Hemoglobin A1c per protocol     Status: Abnormal   Collection Time: 11/29/19  1:43 PM  Result Value Ref Range   Hgb A1c MFr Bld 7.2 (H) 4.8 - 5.6 %    Comment: (NOTE)         Prediabetes: 5.7 - 6.4         Diabetes: >6.4         Glycemic control for adults with diabetes: <7.0    Mean Plasma Glucose 160 mg/dL    Comment: (NOTE) Performed At: [facility] [address] [place], [place] [id number] Jolene Schimke MD [phone]      RADIOLOGY RESULTS: No results found.  Problem List: Patient Active Problem List   Diagnosis Date Noted  . Class 2 severe obesity due to excess calories with serious comorbidity and body mass index (BMI) of 38.0 to 38.9 in adult (HCC) 03/29/2018  . Vitamin B 12 deficiency 12/09/2017  . Inflammatory spondylopathy of lumbar region (HCC) 09/02/2017  . Chronic post-traumatic stress disorder (PTSD) after military combat 06/03/2017  . Chronic right shoulder pain 06/03/2017  . Major depression, recurrent, chronic (HCC) 01/01/2017  . Chronic pain syndrome 09/03/2016  . Insomnia 07/15/2015  . Trigger thumb, right thumb 05/29/2015  . Carpal tunnel syndrome on right 02/06/2015  . Cervical spondylosis without myelopathy 02/06/2015  . BPH (benign prostatic hyperplasia) 11/15/2012  . Lumbar facet arthropathy 08/02/2011  . Lumbar spondylosis 08/02/2011  . Type 2 diabetes mellitus with diabetic polyneuropathy,  without long-term current use of insulin (HCC) 12/22/2010  . Chronic pain of both knees 12/08/2008  . Hyperlipidemia associated with type 2 diabetes mellitus (HCC), on statin 08/23/2006  . Gout 08/23/2006  . Obstructive sleep apnea 08/23/2006  . Allergic rhinitis 08/23/2006  . Chronic low back pain 08/23/2006  . Seizure disorder (HCC) 08/23/2006  . History of CVA (cerebrovascular accident) 08/23/2006    Assessment & Plan: Morbid obesity and multiple comorbities for roux en Y gastric bypass. He understands the procedure.        Matt B. Daphine Deutscher, MD, [facility] [phone] beeper [phone]  12/01/2019 1:07 PM

## 2019-12-02 MED ORDER — BUPIVACAINE LIPOSOME 1.3 % IJ SUSP
20.0000 mL | Freq: Once | INTRAMUSCULAR | Status: DC
  Filled 2019-12-02: qty 20

## 2019-12-02 NOTE — Progress Notes (Signed)
A1c sent to Dr. Martin to review. 

## 2019-12-02 NOTE — Anesthesia Preprocedure Evaluation (Addendum)
Anesthesia Evaluation  Patient identified by MRN, date of birth, ID band Patient awake    Reviewed: Allergy & Precautions, NPO status , Patient's Chart, lab work & pertinent test results  Airway Mallampati: III  TM Distance: >3 FB Neck ROM: Full    Dental  (+) Dental Advisory Given   Pulmonary sleep apnea , former smoker,    breath sounds clear to auscultation       Cardiovascular negative cardio ROS   Rhythm:Regular Rate:Normal     Neuro/Psych Seizures -,  CVA    GI/Hepatic Neg liver ROS, GERD  ,  Endo/Other  diabetes, Type 2, Oral Hypoglycemic Agents  Renal/GU negative Renal ROS     Musculoskeletal  (+) Arthritis ,   Abdominal   Peds  Hematology negative hematology ROS (+)   Anesthesia Other Findings   Reproductive/Obstetrics                             Lab Results  Component Value Date   WBC 6.2 11/29/2019   HGB 15.3 11/29/2019   HCT 47.8 11/29/2019   MCV 78.4 (L) 11/29/2019   PLT 277 11/29/2019   Lab Results  Component Value Date   CREATININE 0.96 11/29/2019   BUN 16 11/29/2019   NA 139 11/29/2019   K 4.2 11/29/2019   CL 103 11/29/2019   CO2 25 11/29/2019    Anesthesia Physical Anesthesia Plan  ASA: III  Anesthesia Plan: General   Post-op Pain Management:    Induction: Intravenous  PONV Risk Score and Plan: 3 and Dexamethasone, Ondansetron, Treatment may vary due to age or medical condition, Midazolam and Scopolamine patch - Pre-op  Airway Management Planned: Oral ETT  Additional Equipment: None  Intra-op Plan:   Post-operative Plan: Extubation in OR  Informed Consent: I have reviewed the patients History and Physical, chart, labs and discussed the procedure including the risks, benefits and alternatives for the proposed anesthesia with the patient or authorized representative who has indicated his/her understanding and acceptance.     Dental advisory  given  Plan Discussed with: CRNA  Anesthesia Plan Comments:       Anesthesia Quick Evaluation

## 2019-12-03 ENCOUNTER — Inpatient Hospital Stay (HOSPITAL_COMMUNITY)
Admission: RE | Admit: 2019-12-03 | Discharge: 2019-12-04 | DRG: 621 | Disposition: A | Discharge status: Home / Self Care | Payer: BC Managed Care – PPO | Attending: Surgery | Admitting: Surgery

## 2019-12-03 ENCOUNTER — Encounter (HOSPITAL_COMMUNITY)
Admission: RE | Disposition: A | Discharge status: Home / Self Care | Payer: Self-pay | Source: Home / Self Care | Attending: Surgery

## 2019-12-03 ENCOUNTER — Inpatient Hospital Stay (HOSPITAL_COMMUNITY): Payer: BC Managed Care – PPO | Admitting: Anesthesiology

## 2019-12-03 ENCOUNTER — Other Ambulatory Visit: Payer: Self-pay

## 2019-12-03 ENCOUNTER — Encounter (HOSPITAL_COMMUNITY): Payer: Self-pay | Admitting: Surgery

## 2019-12-03 DIAGNOSIS — Z79899 Other long term (current) drug therapy: Secondary | ICD-10-CM | POA: Diagnosis not present

## 2019-12-03 DIAGNOSIS — E1169 Type 2 diabetes mellitus with other specified complication: Secondary | ICD-10-CM | POA: Diagnosis present

## 2019-12-03 DIAGNOSIS — E7800 Pure hypercholesterolemia, unspecified: Secondary | ICD-10-CM | POA: Diagnosis present

## 2019-12-03 DIAGNOSIS — Z8673 Personal history of transient ischemic attack (TIA), and cerebral infarction without residual deficits: Secondary | ICD-10-CM

## 2019-12-03 DIAGNOSIS — F4312 Post-traumatic stress disorder, chronic: Secondary | ICD-10-CM | POA: Diagnosis present

## 2019-12-03 DIAGNOSIS — G40909 Epilepsy, unspecified, not intractable, without status epilepticus: Secondary | ICD-10-CM | POA: Diagnosis present

## 2019-12-03 DIAGNOSIS — G894 Chronic pain syndrome: Secondary | ICD-10-CM | POA: Diagnosis not present

## 2019-12-03 DIAGNOSIS — Z6839 Body mass index (BMI) 39.0-39.9, adult: Secondary | ICD-10-CM | POA: Diagnosis not present

## 2019-12-03 DIAGNOSIS — E1142 Type 2 diabetes mellitus with diabetic polyneuropathy: Secondary | ICD-10-CM | POA: Diagnosis not present

## 2019-12-03 DIAGNOSIS — Z20822 Contact with and (suspected) exposure to covid-19: Secondary | ICD-10-CM | POA: Diagnosis present

## 2019-12-03 DIAGNOSIS — Z87891 Personal history of nicotine dependence: Secondary | ICD-10-CM

## 2019-12-03 DIAGNOSIS — G4733 Obstructive sleep apnea (adult) (pediatric): Secondary | ICD-10-CM | POA: Diagnosis not present

## 2019-12-03 DIAGNOSIS — Z6836 Body mass index (BMI) 36.0-36.9, adult: Secondary | ICD-10-CM | POA: Diagnosis not present

## 2019-12-03 DIAGNOSIS — N400 Enlarged prostate without lower urinary tract symptoms: Secondary | ICD-10-CM | POA: Diagnosis present

## 2019-12-03 DIAGNOSIS — K219 Gastro-esophageal reflux disease without esophagitis: Secondary | ICD-10-CM | POA: Diagnosis present

## 2019-12-03 DIAGNOSIS — Z9884 Bariatric surgery status: Secondary | ICD-10-CM

## 2019-12-03 DIAGNOSIS — E119 Type 2 diabetes mellitus without complications: Secondary | ICD-10-CM | POA: Diagnosis not present

## 2019-12-03 DIAGNOSIS — M109 Gout, unspecified: Secondary | ICD-10-CM | POA: Diagnosis not present

## 2019-12-03 DIAGNOSIS — E785 Hyperlipidemia, unspecified: Secondary | ICD-10-CM | POA: Diagnosis not present

## 2019-12-03 DIAGNOSIS — Z7984 Long term (current) use of oral hypoglycemic drugs: Secondary | ICD-10-CM | POA: Diagnosis not present

## 2019-12-03 HISTORY — PX: GASTRIC ROUX-EN-Y: SHX5262

## 2019-12-03 LAB — CBC
HCT: 49 % (ref 39.0–52.0)
Hemoglobin: 15.5 g/dL (ref 13.0–17.0)
MCH: 25.1 pg — ABNORMAL LOW (ref 26.0–34.0)
MCHC: 31.6 g/dL (ref 30.0–36.0)
MCV: 79.4 fL — ABNORMAL LOW (ref 80.0–100.0)
Platelets: 214 10*3/uL (ref 150–400)
RBC: 6.17 MIL/uL — ABNORMAL HIGH (ref 4.22–5.81)
RDW: 16.8 % — ABNORMAL HIGH (ref 11.5–15.5)
WBC: 13.4 10*3/uL — ABNORMAL HIGH (ref 4.0–10.5)
nRBC: 0 % (ref 0.0–0.2)

## 2019-12-03 LAB — GLUCOSE, CAPILLARY
Glucose-Capillary: 119 mg/dL — ABNORMAL HIGH (ref 70–99)
Glucose-Capillary: 154 mg/dL — ABNORMAL HIGH (ref 70–99)
Glucose-Capillary: 152 mg/dL — ABNORMAL HIGH (ref 70–99)

## 2019-12-03 LAB — TYPE AND SCREEN
ABO/RH(D): O POS
Antibody Screen: NEGATIVE

## 2019-12-03 LAB — ABO/RH: ABO/RH(D): O POS

## 2019-12-03 LAB — CREATININE, SERUM
Creatinine, Ser: 1.23 mg/dL (ref 0.61–1.24)
GFR, Estimated: >60 mL/min (ref >60)

## 2019-12-03 SURGERY — LAPAROSCOPIC ROUX-EN-Y GASTRIC BYPASS WITH UPPER ENDOSCOPY
Anesthesia: General | Site: Abdomen

## 2019-12-03 MED ORDER — PANTOPRAZOLE SODIUM 40 MG IV SOLR
40.0000 mg | Freq: Every day | INTRAVENOUS | Status: DC
  Administered 2019-12-03: 40 mg via INTRAVENOUS
  Filled 2019-12-03: qty 40

## 2019-12-03 MED ORDER — MIDAZOLAM HCL 2 MG/2ML IJ SOLN
INTRAMUSCULAR | Status: DC | PRN
  Administered 2019-12-03: 2 mg via INTRAVENOUS

## 2019-12-03 MED ORDER — LIDOCAINE 2% (20 MG/ML) 5 ML SYRINGE
INTRAMUSCULAR | Status: DC | PRN
  Administered 2019-12-03: 1.5 mg/kg/h via INTRAVENOUS

## 2019-12-03 MED ORDER — PHENYLEPHRINE HCL (PRESSORS) 10 MG/ML IV SOLN
INTRAVENOUS | Status: AC
  Filled 2019-12-03: qty 1

## 2019-12-03 MED ORDER — OXYCODONE HCL 5 MG/5ML PO SOLN
5.0000 mg | Freq: Four times a day (QID) | ORAL | Status: DC | PRN
  Administered 2019-12-03 – 2019-12-04 (×2): 5 mg via ORAL
  Filled 2019-12-03 (×2): qty 5

## 2019-12-03 MED ORDER — PROPOFOL 10 MG/ML IV BOLUS
INTRAVENOUS | Status: AC
  Filled 2019-12-03: qty 20

## 2019-12-03 MED ORDER — TISSEEL VH 10 ML EX KIT
PACK | CUTANEOUS | Status: DC | PRN
  Administered 2019-12-03: 1

## 2019-12-03 MED ORDER — PROPOFOL 10 MG/ML IV BOLUS
INTRAVENOUS | Status: DC | PRN
  Administered 2019-12-03: 200 mg via INTRAVENOUS

## 2019-12-03 MED ORDER — LACTATED RINGERS IV SOLN: INTRAVENOUS | Status: DC

## 2019-12-03 MED ORDER — FENTANYL CITRATE (PF) 100 MCG/2ML IJ SOLN
INTRAMUSCULAR | Status: AC
  Filled 2019-12-03: qty 2

## 2019-12-03 MED ORDER — TISSEEL VH 10 ML EX KIT
PACK | CUTANEOUS | Status: AC
  Filled 2019-12-03: qty 1

## 2019-12-03 MED ORDER — SODIUM CHLORIDE 0.9 % IV SOLN
2.0000 g | INTRAVENOUS | Status: AC
  Administered 2019-12-03: 2 g via INTRAVENOUS
  Filled 2019-12-03: qty 2

## 2019-12-03 MED ORDER — ACETAMINOPHEN 500 MG PO TABS
1000.0000 mg | ORAL_TABLET | ORAL | Status: AC
  Administered 2019-12-03: 1000 mg via ORAL
  Filled 2019-12-03: qty 2

## 2019-12-03 MED ORDER — FENTANYL CITRATE (PF) 250 MCG/5ML IJ SOLN
INTRAMUSCULAR | Status: AC
  Filled 2019-12-03: qty 5

## 2019-12-03 MED ORDER — LIDOCAINE 2% (20 MG/ML) 5 ML SYRINGE
INTRAMUSCULAR | Status: AC
  Filled 2019-12-03: qty 5

## 2019-12-03 MED ORDER — MIDAZOLAM HCL 2 MG/2ML IJ SOLN
INTRAMUSCULAR | Status: AC
  Filled 2019-12-03: qty 2

## 2019-12-03 MED ORDER — CHLORHEXIDINE GLUCONATE CLOTH 2 % EX PADS: 6.0000 | MEDICATED_PAD | Freq: Once | CUTANEOUS | Status: DC

## 2019-12-03 MED ORDER — APREPITANT 40 MG PO CAPS
40.0000 mg | ORAL_CAPSULE | ORAL | Status: AC
  Administered 2019-12-03: 40 mg via ORAL
  Filled 2019-12-03: qty 1

## 2019-12-03 MED ORDER — SUCCINYLCHOLINE CHLORIDE 200 MG/10ML IV SOSY
PREFILLED_SYRINGE | INTRAVENOUS | Status: AC
  Filled 2019-12-03: qty 10

## 2019-12-03 MED ORDER — ROCURONIUM BROMIDE 10 MG/ML (PF) SYRINGE
PREFILLED_SYRINGE | INTRAVENOUS | Status: DC | PRN
  Administered 2019-12-03: 50 mg via INTRAVENOUS
  Administered 2019-12-03: 20 mg via INTRAVENOUS
  Administered 2019-12-03: 50 mg via INTRAVENOUS
  Administered 2019-12-03 (×2): 20 mg via INTRAVENOUS
  Administered 2019-12-03: 10 mg via INTRAVENOUS

## 2019-12-03 MED ORDER — ONDANSETRON HCL 4 MG/2ML IJ SOLN
INTRAMUSCULAR | Status: DC | PRN
  Administered 2019-12-03: 4 mg via INTRAVENOUS

## 2019-12-03 MED ORDER — FENTANYL CITRATE (PF) 100 MCG/2ML IJ SOLN
25.0000 ug | INTRAMUSCULAR | Status: DC | PRN
  Administered 2019-12-03 (×2): 50 ug via INTRAVENOUS

## 2019-12-03 MED ORDER — METOPROLOL TARTRATE 5 MG/5ML IV SOLN: 5.0000 mg | Freq: Four times a day (QID) | INTRAVENOUS | Status: DC | PRN

## 2019-12-03 MED ORDER — ENSURE MAX PROTEIN PO LIQD
2.0000 [oz_av] | ORAL | Status: DC
  Administered 2019-12-04 (×4): 2 [oz_av] via ORAL

## 2019-12-03 MED ORDER — ROCURONIUM BROMIDE 10 MG/ML (PF) SYRINGE
PREFILLED_SYRINGE | INTRAVENOUS | Status: AC
  Filled 2019-12-03: qty 10

## 2019-12-03 MED ORDER — HEPARIN SODIUM (PORCINE) 5000 UNIT/ML IJ SOLN
5000.0000 [IU] | Freq: Three times a day (TID) | INTRAMUSCULAR | Status: DC
  Administered 2019-12-03 – 2019-12-04 (×4): 5000 [IU] via SUBCUTANEOUS
  Filled 2019-12-03 (×4): qty 1

## 2019-12-03 MED ORDER — MORPHINE SULFATE (PF) 2 MG/ML IV SOLN
1.0000 mg | INTRAVENOUS | Status: DC | PRN
  Administered 2019-12-03: 2 mg via INTRAVENOUS
  Filled 2019-12-03: qty 1

## 2019-12-03 MED ORDER — FENTANYL CITRATE (PF) 250 MCG/5ML IJ SOLN
INTRAMUSCULAR | Status: DC | PRN
  Administered 2019-12-03: 100 ug via INTRAVENOUS
  Administered 2019-12-03: 50 ug via INTRAVENOUS
  Administered 2019-12-03: 150 ug via INTRAVENOUS
  Administered 2019-12-03: 50 ug via INTRAVENOUS

## 2019-12-03 MED ORDER — KETAMINE HCL 10 MG/ML IJ SOLN
INTRAMUSCULAR | Status: DC | PRN
  Administered 2019-12-03: 60 mg via INTRAVENOUS

## 2019-12-03 MED ORDER — SUGAMMADEX SODIUM 200 MG/2ML IV SOLN
INTRAVENOUS | Status: DC | PRN
  Administered 2019-12-03: 300 mg via INTRAVENOUS

## 2019-12-03 MED ORDER — ONDANSETRON HCL 4 MG/2ML IJ SOLN
INTRAMUSCULAR | Status: AC
  Filled 2019-12-03: qty 2

## 2019-12-03 MED ORDER — PHENYLEPHRINE 40 MCG/ML (10ML) SYRINGE FOR IV PUSH (FOR BLOOD PRESSURE SUPPORT)
PREFILLED_SYRINGE | INTRAVENOUS | Status: DC | PRN
  Administered 2019-12-03 (×3): 80 ug via INTRAVENOUS

## 2019-12-03 MED ORDER — SODIUM CHLORIDE (PF) 0.9 % IJ SOLN
INTRAMUSCULAR | Status: AC
  Filled 2019-12-03: qty 20

## 2019-12-03 MED ORDER — CHLORHEXIDINE GLUCONATE 0.12 % MT SOLN
15.0000 mL | Freq: Once | OROMUCOSAL | Status: AC
  Administered 2019-12-03: 15 mL via OROMUCOSAL

## 2019-12-03 MED ORDER — SODIUM CHLORIDE (PF) 0.9 % IJ SOLN
INTRAMUSCULAR | Status: DC | PRN
  Administered 2019-12-03: 20 mL

## 2019-12-03 MED ORDER — LIDOCAINE HCL 2 % IJ SOLN
INTRAMUSCULAR | Status: AC
  Filled 2019-12-03: qty 20

## 2019-12-03 MED ORDER — ACETAMINOPHEN 160 MG/5ML PO SOLN
1000.0000 mg | Freq: Three times a day (TID) | ORAL | Status: DC
  Administered 2019-12-03 – 2019-12-04 (×3): 1000 mg via ORAL
  Filled 2019-12-03 (×3): qty 40.6

## 2019-12-03 MED ORDER — ACETAMINOPHEN 500 MG PO TABS
1000.0000 mg | ORAL_TABLET | Freq: Three times a day (TID) | ORAL | Status: DC
  Filled 2019-12-03: qty 2

## 2019-12-03 MED ORDER — ORAL CARE MOUTH RINSE: 15.0000 mL | Freq: Once | OROMUCOSAL | Status: AC

## 2019-12-03 MED ORDER — AMISULPRIDE (ANTIEMETIC) 5 MG/2ML IV SOLN: 10.0000 mg | Freq: Once | INTRAVENOUS | Status: DC | PRN

## 2019-12-03 MED ORDER — DEXAMETHASONE SODIUM PHOSPHATE 10 MG/ML IJ SOLN
INTRAMUSCULAR | Status: AC
  Filled 2019-12-03: qty 1

## 2019-12-03 MED ORDER — PHENYLEPHRINE 40 MCG/ML (10ML) SYRINGE FOR IV PUSH (FOR BLOOD PRESSURE SUPPORT)
PREFILLED_SYRINGE | INTRAVENOUS | Status: AC
  Filled 2019-12-03: qty 10

## 2019-12-03 MED ORDER — ONDANSETRON HCL 4 MG/2ML IJ SOLN: 4.0000 mg | INTRAMUSCULAR | Status: DC | PRN

## 2019-12-03 MED ORDER — SUGAMMADEX SODIUM 500 MG/5ML IV SOLN
INTRAVENOUS | Status: AC
  Filled 2019-12-03: qty 5

## 2019-12-03 MED ORDER — HEPARIN SODIUM (PORCINE) 5000 UNIT/ML IJ SOLN
5000.0000 [IU] | INTRAMUSCULAR | Status: AC
  Administered 2019-12-03: 5000 [IU] via SUBCUTANEOUS
  Filled 2019-12-03: qty 1

## 2019-12-03 MED ORDER — BUPIVACAINE LIPOSOME 1.3 % IJ SUSP
INTRAMUSCULAR | Status: DC | PRN
  Administered 2019-12-03: 20 mL

## 2019-12-03 MED ORDER — LIDOCAINE 2% (20 MG/ML) 5 ML SYRINGE
INTRAMUSCULAR | Status: DC | PRN
  Administered 2019-12-03: 100 mg via INTRAVENOUS

## 2019-12-03 MED ORDER — PHENOL 1.4 % MT LIQD
1.0000 | OROMUCOSAL | Status: DC | PRN
  Administered 2019-12-03: 1 via OROMUCOSAL
  Filled 2019-12-03: qty 177

## 2019-12-03 MED ORDER — LACTATED RINGERS IR SOLN
Status: DC | PRN
  Administered 2019-12-03: 1000 mL

## 2019-12-03 MED ORDER — KCL IN DEXTROSE-NACL 20-5-0.45 MEQ/L-%-% IV SOLN
INTRAVENOUS | Status: DC
  Filled 2019-12-03 (×3): qty 1000

## 2019-12-03 MED ORDER — DEXAMETHASONE SODIUM PHOSPHATE 10 MG/ML IJ SOLN
INTRAMUSCULAR | Status: DC | PRN
  Administered 2019-12-03: 10 mg via INTRAVENOUS

## 2019-12-03 MED ORDER — PHENYLEPHRINE HCL-NACL 10-0.9 MG/250ML-% IV SOLN
INTRAVENOUS | Status: DC | PRN
  Administered 2019-12-03: 50 ug/min via INTRAVENOUS

## 2019-12-03 MED ORDER — SCOPOLAMINE 1 MG/3DAYS TD PT72
1.0000 | MEDICATED_PATCH | TRANSDERMAL | Status: DC
  Administered 2019-12-03: 1.5 mg via TRANSDERMAL
  Filled 2019-12-03: qty 1

## 2019-12-03 MED ORDER — KETAMINE HCL 10 MG/ML IJ SOLN
INTRAMUSCULAR | Status: AC
  Filled 2019-12-03: qty 1

## 2019-12-03 SURGICAL SUPPLY — 64 items
APPLICATOR COTTON TIP 6 STRL (MISCELLANEOUS) ×2 IMPLANT
APPLICATOR COTTON TIP 6IN STRL (MISCELLANEOUS) ×4
APPLIER CLIP ROT 10 11.4 M/L (STAPLE)
APPLIER CLIP ROT 13.4 12 LRG (CLIP)
BLADE SURG 15 STRL LF DISP TIS (BLADE) ×1 IMPLANT
BLADE SURG 15 STRL SS (BLADE) ×2
CABLE HIGH FREQUENCY MONO STRZ (ELECTRODE) IMPLANT
CLIP APPLIE ROT 10 11.4 M/L (STAPLE) IMPLANT
CLIP APPLIE ROT 13.4 12 LRG (CLIP) IMPLANT
CLIP SUT LAPRA TY ABSORB (SUTURE) ×2 IMPLANT
COVER WAND RF STERILE (DRAPES) IMPLANT
DEVICE SUT QUICK LOAD TK 5 (STAPLE) IMPLANT
DEVICE SUT TI-KNOT TK 5X26 (MISCELLANEOUS) IMPLANT
DEVICE SUTURE ENDOST 10MM (ENDOMECHANICALS) ×2 IMPLANT
DISSECTOR BLUNT TIP ENDO 5MM (MISCELLANEOUS) IMPLANT
DRAIN PENROSE 0.25X18 (DRAIN) ×2 IMPLANT
GAUZE 4X4 16PLY RFD (DISPOSABLE) ×2 IMPLANT
GLOVE BIOGEL M 8.0 STRL (GLOVE) ×2 IMPLANT
GOWN STRL REUS W/TWL XL LVL3 (GOWN DISPOSABLE) ×4 IMPLANT
HANDLE STAPLE EGIA 4 XL (STAPLE) ×2 IMPLANT
KIT BASIN OR (CUSTOM PROCEDURE TRAY) ×2 IMPLANT
KIT GASTRIC LAVAGE 34FR ADT (SET/KITS/TRAYS/PACK) ×2 IMPLANT
KIT TURNOVER KIT A (KITS) IMPLANT
MARKER SKIN DUAL TIP RULER LAB (MISCELLANEOUS) ×2 IMPLANT
MAT PREVALON FULL STRYKER (MISCELLANEOUS) ×2 IMPLANT
NEEDLE SPNL 22GX3.5 QUINCKE BK (NEEDLE) ×2 IMPLANT
PACK CARDIOVASCULAR III (CUSTOM PROCEDURE TRAY) ×2 IMPLANT
PENCIL SMOKE EVACUATOR (MISCELLANEOUS) IMPLANT
RELOAD EGIA 45 MED/THCK PURPLE (STAPLE) ×2 IMPLANT
RELOAD EGIA 45 TAN VASC (STAPLE) IMPLANT
RELOAD EGIA 60 MED/THCK PURPLE (STAPLE) ×6 IMPLANT
RELOAD EGIA 60 TAN VASC (STAPLE) ×4 IMPLANT
RELOAD ENDO STITCH 2.0 (ENDOMECHANICALS) ×24
RELOAD TRI 45 ART MED THCK PUR (STAPLE) ×4 IMPLANT
RELOAD TRI 60 ART MED THCK PUR (STAPLE) IMPLANT
SCISSORS LAP 5X45 EPIX DISP (ENDOMECHANICALS) ×2 IMPLANT
SEALANT SURGICAL APPL DUAL CAN (MISCELLANEOUS) ×2 IMPLANT
SET IRRIG TUBING LAPAROSCOPIC (IRRIGATION / IRRIGATOR) ×2 IMPLANT
SET TUBE SMOKE EVAC HIGH FLOW (TUBING) ×2 IMPLANT
SHEARS HARMONIC ACE PLUS 45CM (MISCELLANEOUS) ×2 IMPLANT
SLEEVE ADV FIXATION 12X100MM (TROCAR) ×4 IMPLANT
SLEEVE ADV FIXATION 5X100MM (TROCAR) IMPLANT
SOL ANTI FOG 6CC (MISCELLANEOUS) ×1 IMPLANT
SOLUTION ANTI FOG 6CC (MISCELLANEOUS) ×1
STAPLER VISISTAT 35W (STAPLE) IMPLANT
SUT MNCRL AB 4-0 PS2 18 (SUTURE) ×6 IMPLANT
SUT RELOAD ENDO STITCH 2 48X1 (ENDOMECHANICALS) ×7
SUT RELOAD ENDO STITCH 2.0 (ENDOMECHANICALS) ×5
SUT SURGIDAC NAB ES-9 0 48 120 (SUTURE) IMPLANT
SUT VIC AB 2-0 SH 27 (SUTURE) ×2
SUT VIC AB 2-0 SH 27X BRD (SUTURE) ×1 IMPLANT
SUTURE RELOAD END STTCH 2 48X1 (ENDOMECHANICALS) ×7 IMPLANT
SUTURE RELOAD ENDO STITCH 2.0 (ENDOMECHANICALS) ×5 IMPLANT
SYR 10ML ECCENTRIC (SYRINGE) ×2 IMPLANT
SYR 20ML LL LF (SYRINGE) ×4 IMPLANT
SYR 50ML LL SCALE MARK (SYRINGE) ×2 IMPLANT
TOWEL OR 17X26 10 PK STRL BLUE (TOWEL DISPOSABLE) ×2 IMPLANT
TOWEL OR NON WOVEN STRL DISP B (DISPOSABLE) ×2 IMPLANT
TRAY FOLEY MTR SLVR 16FR STAT (SET/KITS/TRAYS/PACK) ×2 IMPLANT
TROCAR ADV FIXATION 12X100MM (TROCAR) ×2 IMPLANT
TROCAR ADV FIXATION 5X100MM (TROCAR) ×2 IMPLANT
TROCAR BLADELESS OPT 5 100 (ENDOMECHANICALS) ×4 IMPLANT
TROCAR XCEL 12X100 BLDLESS (ENDOMECHANICALS) ×2 IMPLANT
TUBING CONNECTING 10 (TUBING) ×2 IMPLANT

## 2019-12-03 NOTE — Anesthesia Postprocedure Evaluation (Signed)
Anesthesia Post Note  Patient: [name]  Procedure(s) Performed: LAPAROSCOPIC ROUX-EN-Y GASTRIC BYPASS WITH UPPER ENDOSCOPY (N/A Abdomen)     Patient location during evaluation: PACU Anesthesia Type: General Level of consciousness: awake and alert Pain management: pain level controlled Vital Signs Assessment: post-procedure vital signs reviewed and stable Respiratory status: spontaneous breathing, nonlabored ventilation, respiratory function stable and patient connected to nasal cannula oxygen Cardiovascular status: blood pressure returned to baseline and stable Postop Assessment: no apparent nausea or vomiting Anesthetic complications: no   No complications documented.  Last Vitals:  Vitals:   12/03/19 1145 12/03/19 1215  BP: (!) 134/106 (!) 140/94  Pulse: 95 96  Resp: 11 18  Temp:  36.8 C  SpO2: 98% 100%    Last Pain:  Vitals:   12/03/19 1251  TempSrc:   PainSc: Asleep                 Kennieth Rad

## 2019-12-03 NOTE — Progress Notes (Signed)
Discussed post op day goals with patient including ambulation, IS, diet progression, pain, and nausea control.  BSTOP education provided including BSTOP information guide, "Guide for Pain Management after your Bariatric Procedure".  Questions answered. 

## 2019-12-03 NOTE — Op Note (Signed)
Name:  [name] MRN: [id number] Date of Surgery: 12/03/2019  Preop Diagnosis:  Morbid Obesity, S/P RYGB  Postop Diagnosis:  Morbid Obesity, S/P RYGB (Weight - 119 kg, BMI - 36.7)  Procedure:  Upper endoscopy  (Intraoperative)  Surgeon:  Ovidio Kin, M.D.  Anesthesia:  GET  Indications for procedure: [name] is a 49 y.o. male whose primary care physician is Willow Ora, MD and has completed a Roux-en-Y gastric bypass today by Dr. Daphine Deutscher.  I am doing an intraoperative upper endoscopy to evaluate the gastric pouch and the gastro-jejunal anastomosis.  Operative Note: The patient is under general anesthesia.  Dr. Daphine Deutscher is laparoscoping the patient while I do an upper endoscopy to evaluate the stomach pouch and gastrojejunal anastomosis.  With the patient intubated, I passed the Olympus endoscope without difficulty down the esophagus.  The esophago-gastric junction was at 42 cm.  The gastro-jejunal anastomosis was at 47 cm.  The mucosa of the stomach looked viable and the staple line was intact without bleeding.  The gastro-jejunal anastomosis looked okay.  While I insufflated the stomach pouch with air, Dr. Daphine Deutscher clamped off the efferent limb of the jejunum.  He then flooded the upper abdomen with saline to put the gastric pouch and gastro-jejunal anastomosis under saline.  There was no bubbling or evidence of a leak.    The scope was then withdrawn.  The esophagus was unremarkable and the patient tolerated the endoscopy without difficulty.  Ovidio Kin, MD, [facility] Office phone:  [phone]

## 2019-12-03 NOTE — Anesthesia Procedure Notes (Signed)
Procedure Name: Intubation Date/Time: 12/03/2019 7:21 AM Performed by: Florene Route, CRNA Patient Re-evaluated:Patient Re-evaluated prior to induction Oxygen Delivery Method: Circle system utilized Preoxygenation: Pre-oxygenation with 100% oxygen Induction Type: IV induction Ventilation: Mask ventilation without difficulty and Oral airway inserted - appropriate to patient size Laryngoscope Size: Miller and 3 Grade View: Grade II Tube type: Oral Tube size: 8.0 mm Number of attempts: 1 Airway Equipment and Method: Stylet Placement Confirmation: ETT inserted through vocal cords under direct vision,  positive ETCO2 and breath sounds checked- equal and bilateral Secured at: 22 cm Tube secured with: Tape Dental Injury: Teeth and Oropharynx as per pre-operative assessment

## 2019-12-03 NOTE — Op Note (Signed)
03 December 2019 Surgeon: Pollyann Savoy. Daphine Deutscher, MD, FACS Asst:  Ovidio Kin, MD, FACS Anesthesia: General endotracheal Drains: None  Procedure: Laparoscopic Roux en Y gastric bypass with 40 cm BP limb and 100 cm Roux limb, antecolic, antegastric, candy cane to the left.  Closure of Peterson's defect. Upper endoscopy by Dr. Ezzard Standing.   Description of Procedure:  The patient was taken to OR 1 at [facility] and given general anesthesia.  The abdomen was prepped with PCMX and draped sterilely.  A time out was performed.    The operation began by identifying the ligament of Treitz. I measured 40 cm downstream and divided the bowel with a 6 cm Covidian stapler.  I sutured a Penrose drain along the Roux limb end.  I measured a 1 meter (100 cm) Roux limb and then placed the distal bowels to the BP limb side by side and performed a stapled jejunojejunostomy. The common defect was closed from either end with 4-0 Vicryl using the Endo Stitch. The mesenteric defect was closed with a running 2-0 silk using the Endo Stitch. Tisseel was applied to the suture line.  The omentum was divided with the harmonic scalpel.  The Nathanson retractor was inserted in the left lateral segment of liver was retracted. The foregut dissection ensued.  6 cm along the lessor curvature I entered the retrogastric space and created a pouch with multiple firings of the purple load the first two of them had no TRS.  The pouch looked good and I did resect a portion of the remnant that was a narrow area that might not drain.  The Roux limb was then brought up with the candycane pointed left and a back row of sutures of 2-0 Vicryl were placed. I opened along the right side of each structure and inserted the 4.5 cm stapler to create the gastrojejunostomy. The common defect was closed from either end with 2-0 Vicryl and a second row was placed anterior to that the Ewald tube acting as a stent across the anastomosis. The Penrose drain was removed. Peterson's  defect was closed with 2-0 silk.   Endoscopy was performed by Dr. Ezzard Standing and no bubbles or bleeding were noted.   The incisions were injected with Exparel as a TAP block and were closed with 4-0 Monocryl and Dermabond.  The patient was taken to the recovery room in satisfactory condition.  Matt B. Daphine Deutscher, MD, FACS

## 2019-12-03 NOTE — Progress Notes (Signed)
PHARMACY CONSULT FOR:  Risk Assessment for Post-Discharge VTE Following Bariatric Surgery  Post-Discharge VTE Risk Assessment: This patient's probability of 30-day post-discharge VTE is increased due to the factors marked: X  Male    Age >/=60 years    BMI >/=50 kg/m2    CHF    Dyspnea at Rest    Paraplegia  X  Non-gastric-band surgery    Operation Time >/=3 hr    Return to OR     Length of Stay >/= 3 d   Hx of VTE   Hypercoagulable condition   Significant venous stasis       Predicted probability of 30-day post-discharge VTE: 0.31%  Other patient-specific factors to consider:none  Recommendation for Discharge: No pharmacologic prophylaxis post-discharge  Jacquez Sheetz is a 49 y.o. male who underwent laparoscopic Roux-en-Y gastric bypass 12/03/2019   Allergies  Allergen Reactions  . Atorvastatin Swelling  . Tizanidine Hives  . Cymbalta [Duloxetine Hcl]     Stomach upset, difficulty urinating, decreased libido.    Patient Measurements: Weight: 115.9 kg (255 lb 9.6 oz) Body mass index is 36.67 kg/m.  No results for input(s): WBC, HGB, HCT, PLT, APTT, CREATININE, LABCREA, CREATININE, CREAT24HRUR, MG, PHOS, ALBUMIN, PROT, ALBUMIN, AST, ALT, ALKPHOS, BILITOT, BILIDIR, IBILI in the last 72 hours. Estimated Creatinine Clearance: 117.4 mL/min (by C-G formula based on SCr of 0.96 mg/dL).    Past Medical History:  Diagnosis Date  . Adrenal abnormality (HCC)   . Allergy   . Anxiety   . Arthritis   . Atypical chest pain   . BPH (benign prostatic hypertrophy)   . Complication of anesthesia    Hard time waking up after neck surgery  . Depression   . DM (diabetes mellitus) (HCC)   . GERD (gastroesophageal reflux disease)   . Gout   . H/O: CVA (cardiovascular accident)   . Hyperlipidemia   . Knee pain, bilateral   . LBP (low back pain)   . OSA (obstructive sleep apnea)   . Plantar fasciitis   . Seizure disorder (HCC)      Medications Prior to Admission   Medication Sig Dispense Refill Last Dose  . acetaminophen (TYLENOL) 500 MG tablet Take 1,000 mg by mouth every 6 (six) hours as needed for mild pain or moderate pain.    Past Week at Unknown time  . cetirizine (ZYRTEC) 10 MG tablet Take 10 mg by mouth daily as needed for allergies.    Past Week at Unknown time  . Cholecalciferol (VITAMIN D3) 125 MCG (5000 UT) CAPS Take 5,000 Units by mouth daily.    Past Week at Unknown time  . cyclobenzaprine (FLEXERIL) 10 MG tablet Take 10 mg by mouth 3 (three) times daily.   12/02/2019 at Unknown time  . dapagliflozin propanediol (FARXIGA) 10 MG TABS tablet Take 10 mg by mouth daily. 90 tablet 3 12/02/2019 at Unknown time  . diclofenac sodium (VOLTAREN) 1 % GEL Apply 2 g topically 2 (two) times daily.   Past Week at Unknown time  . famotidine (PEPCID) 20 MG tablet Take 20 mg by mouth daily as needed for heartburn or indigestion.  30 tablet 0 12/02/2019 at Unknown time  . fluticasone (FLONASE) 50 MCG/ACT nasal spray Place 2 sprays into both nostrils daily as needed for allergies or rhinitis.    12/02/2019 at Unknown time  . ibuprofen (ADVIL) 600 MG tablet Take 600 mg by mouth every 6 (six) hours as needed for mild pain or moderate pain.  Past Week at Unknown time  . metFORMIN (GLUCOPHAGE) 500 MG tablet TAKE 2 TABLETS BY MOUTH TWICE A DAY WITH A MEAL (Patient taking differently: Take 1,000 mg by mouth 2 (two) times daily with a meal. ) 360 tablet 2 12/02/2019 at Unknown time  . Oral Electrolytes (EMERGEN-C ELECTRO MIX) PACK Take 1 Package by mouth daily.   12/02/2019 at Unknown time  . oxyCODONE-acetaminophen (PERCOCET) 10-325 MG tablet Take 1 tablet by mouth every 6 (six) hours as needed for pain. 120 tablet 0 12/02/2019 at Unknown time  . rosuvastatin (CRESTOR) 20 MG tablet TAKE 1 TABLET BY MOUTH EVERY DAY (Patient taking differently: Take 40 mg by mouth daily. ) 90 tablet 2 12/02/2019 at Unknown time  . sildenafil (VIAGRA) 100 MG tablet Take 100 mg by mouth as needed  for erectile dysfunction (Take one-half tablet by mouth as instructed (take 1 hour prior to sexual activity *do not exceed 1 dose per 24 hour period*)).   Past Week at Unknown time  . traZODone (DESYREL) 100 MG tablet Take 200 mg by mouth at bedtime as needed for sleep.   0 Past Month at Unknown time  . venlafaxine XR (EFFEXOR-XR) 150 MG 24 hr capsule Take 150 mg by mouth 2 (two) times daily.    12/02/2019 at Unknown time  . vitamin B-12 (CYANOCOBALAMIN) 100 MCG tablet Take 1 tablet (100 mcg total) by mouth daily. (Patient taking differently: Take 5,000 mcg by mouth daily. )   Past Week at Unknown time  . aspirin EC 81 MG tablet Take 1 tablet (81 mg total) by mouth daily. (Patient not taking: Reported on 05/02/2019) 30 tablet 0      Herby Abraham, Pharm.D 12/03/2019 10:43 AM

## 2019-12-03 NOTE — Discharge Instructions (Signed)
   GASTRIC BYPASS/SLEEVE  Home Care Instructions   These instructions are to help you care for yourself when you go home.  Call: If you have any problems. . Call [phone] and ask for the surgeon on call . If you need immediate help, come to the ER at [facility].  . Tell the ER staff that you are a new post-op gastric bypass or gastric sleeve patient   Signs and symptoms to report: . Severe vomiting or nausea o If you cannot keep down clear liquids for longer than 1 day, call your surgeon  . Abdominal pain that does not get better after taking your pain medication . Fever over 100.4 F with chills . Heart beating over 100 beats a minute . Shortness of breath at rest . Chest pain .  Redness, swelling, drainage, or foul odor at incision (surgical) sites .  If your incisions open or pull apart . Swelling or pain in calf (lower leg) . Diarrhea (Loose bowel movements that happen often), frequent watery, uncontrolled bowel movements . Constipation, (no bowel movements for 3 days) if this happens: Pick one o Milk of Magnesia, 2 tablespoons by mouth, 3 times a day for 2 days if needed o Stop taking Milk of Magnesia once you have a bowel movement o Call your doctor if constipation continues Or o Miralax  (instead of Milk of Magnesia) following the label instructions o Stop taking Miralax once you have a bowel movement o Call your doctor if constipation continues . Anything you think is not normal   Normal side effects after surgery: . Unable to sleep at night or unable to focus . Irritability or moody . Being tearful (crying) or depressed These are common complaints, possibly related to your anesthesia medications that put you to sleep, stress of surgery, and change in lifestyle.  This usually goes away a few weeks after surgery.  If these feelings continue, call your primary care doctor.   Wound Care: You may have surgical glue, steri-strips, or staples over your incisions after  surgery . Surgical glue:  Looks like a clear film over your incisions and will wear off a little at a time . Steri-strips: Strips of tape over your incisions. You may notice a yellowish color on the skin under the steri-strips. This is used to make the   steri-strips stick better. Do not pull the steri-strips off - let them fall off . Staples: Staples may be removed before you leave the hospital o If you go home with staples, call [facility], [phone] at for an appointment with your surgeon's nurse to have staples removed 10 days after surgery. . Showering: You may shower two (2) days after your surgery unless your surgeon tells you differently o Wash gently around incisions with warm soapy water, rinse well, and gently pat dry  o No tub baths until staples are removed, steri-strips fall off or glue is gone.    Medications: . Medications should be liquid or crushed if larger than the size of a dime . Extended release pills (medication that release a little bit at a time through the day) should NOT be crushed or cut. (examples include XL, ER, DR, SR) . Depending on the size and number of medications you take, you may need to space (take a few throughout the day)/change the time you take your medications so that you do not over-fill your pouch (smaller stomach) . Make sure you follow-up with your primary care doctor to   make medication changes needed during rapid weight loss and life-style changes . If you have diabetes, follow up with the doctor that orders your diabetes medication(s) within one week after surgery and check your blood sugar regularly. . Do not drive while taking prescription pain medication  . It is ok to take Tylenol by the bottle instructions with your pain medicine or instead of your pain medicine as needed.  DO NOT TAKE NSAIDS (EXAMPLES OF NSAIDS:  IBUPROFREN/ NAPROXEN)  Diet:                    First 2 Weeks  You will see the dietician t about two (2) weeks  after your surgery. The dietician will increase the types of foods you can eat if you are handling liquids well: . If you have severe vomiting or nausea and cannot keep down clear liquids lasting longer than 1 day, call your surgeon @ [phone]) Protein Shake . Drink at least 2 ounces of shake 5-6 times per day . Each serving of protein shakes (usually 8 - 12 ounces) should have: o 15 grams of protein  o And no more than 5 grams of carbohydrate  . Goal for protein each day: o Men = 80 grams per day o Women = 60 grams per day . Protein powder may be added to fluids such as non-fat milk or Lactaid milk or unsweetened Soy/Almond milk (limit to 35 grams added protein powder per serving)  Hydration . Slowly increase the amount of water and other clear liquids as tolerated (See Acceptable Fluids) . Slowly increase the amount of protein shake as tolerated  .  Sip fluids slowly and throughout the day.  Do not use straws. . May use sugar substitutes in small amounts (no more than 6 - 8 packets per day; i.e. Splenda)  Fluid Goal . The first goal is to drink at least 8 ounces of protein shake/drink per day (or as directed by the nutritionist); some examples of protein shakes are Syntrax Nectar, Adkins Advantage, EAS Edge HP, and Unjury. See handout from pre-op Bariatric Education Class: o Slowly increase the amount of protein shake you drink as tolerated o You may find it easier to slowly sip shakes throughout the day o It is important to get your proteins in first . Your fluid goal is to drink 64 - 100 ounces of fluid daily o It may take a few weeks to build up to this . 32 oz (or more) should be clear liquids  And  . 32 oz (or more) should be full liquids (see below for examples) . Liquids should not contain sugar, caffeine, or carbonation  Clear Liquids: . Water or Sugar-free flavored water (i.e. Fruit H2O, Propel) . Decaffeinated coffee or tea (sugar-free) . Crystal Lite, Wyler's Lite,  Minute Maid Lite . Sugar-free Jell-O . Bouillon or broth . Sugar-free Popsicle:   *Less than 20 calories each; Limit 1 per day  Full Liquids: Protein Shakes/Drinks + 2 choices per day of other full liquids . Full liquids must be: o No More Than 15 grams of Carbs per serving  o No More Than 3 grams of Fat per serving . Strained low-fat cream soup (except Cream of Potato or Tomato) . Non-Fat milk . Fat-free Lactaid Milk . Unsweetened Soy Or Unsweetened Almond Milk . Low Sugar yogurt (Dannon Lite & Fit, Greek yogurt; Oikos Triple Zero; Chobani Simply 100; Yoplait 100 calorie Greek - No Fruit on the Bottom)    Vitamins   and Minerals . Start 1 day after surgery unless otherwise directed by your surgeon . Chewable Bariatric Specific Multivitamin / Multimineral Supplement with iron (Example: Bariatric Advantage Multi EA) . Chewable Calcium with Vitamin D-3 (Example: 3 Chewable Calcium Plus 600 with Vitamin D-3) o Take 500 mg three (3) times a day for a total of 1500 mg each day o Do not take all 3 doses of calcium at one time as it may cause constipation, and you can only absorb 500 mg  at a time  o Do not mix multivitamins containing iron with calcium supplements; take 2 hours apart . Menstruating women and those with a history of anemia (a blood disease that causes weakness) may need extra iron o Talk with your doctor to see if you need more iron . Do not stop taking or change any vitamins or minerals until you talk to your dietitian or surgeon . Your Dietitian and/or surgeon must approve all vitamin and mineral supplements   Activity and Exercise: Limit your physical activity as instructed by your doctor.  It is important to continue walking at home.  During this time, use these guidelines: . Do not lift anything greater than ten (10) pounds for at least two (2) weeks . Do not go back to work or drive until your surgeon says you can . You may have sex when you feel comfortable  o It is  VERY important for male patients to use a reliable birth control method; fertility often increases after surgery  o All hormonal birth control will be ineffective for 30 days after surgery due to medications given during surgery a barrier method must be used. o Do not get pregnant for at least 18 months . Start exercising as soon as your doctor tells you that you can o Make sure your doctor approves any physical activity . Start with a simple walking program . Walk 5-15 minutes each day, 7 days per week.  . Slowly increase until you are walking 30-45 minutes per day Consider joining our BELT program. [phone] or email [email or web address]   Special Instructions Things to remember: . Use your CPAP when sleeping if this applies to you  . [facility] has two free Bariatric Surgery Support Groups that meet monthly o The 3rd Thursday of each month, 6 pm o The 2nd Friday of each month, 11:30 through December 2021 . It is very important to keep all follow up appointments with your surgeon, dietitian, primary care physician, and behavioral health practitioner . Routine follow up schedule with your surgeon include appointments at 2-3 weeks, 6-8 weeks, 6 months, and 1 year at a minimum.  Your surgeon may request to see you more often.   . After the first year, please follow up with your bariatric surgeon and dietitian at least once a year in order to maintain best weight loss results   [facility]: [phone] [facility]: [phone] Bariatric Nurse Coordinator: [phone]      Reviewed and Endorsed  by  Patient Education Committee, June, 2016 Edits Approved: Aug, 2018    

## 2019-12-03 NOTE — Transfer of Care (Signed)
Immediate Anesthesia Transfer of Care Note  Patient: [name]  Procedure(s) Performed: LAPAROSCOPIC ROUX-EN-Y GASTRIC BYPASS WITH UPPER ENDOSCOPY (N/A Abdomen)  Patient Location: PACU  Anesthesia Type:General  Level of Consciousness: drowsy  Airway & Oxygen Therapy: Patient Spontanous Breathing and Patient connected to face mask oxygen  Post-op Assessment: Report given to RN and Post -op Vital signs reviewed and stable  Post vital signs: Reviewed and stable  Last Vitals:  Vitals Value Taken Time  BP    Temp    Pulse 98 12/03/19 1042  Resp 14 12/03/19 1042  SpO2 100 % 12/03/19 1042  Vitals shown include unvalidated device data.  Last Pain:  Vitals:   12/03/19 0600  TempSrc: Oral         Complications: No complications documented.

## 2019-12-03 NOTE — Interval H&P Note (Signed)
History and Physical Interval Note:  12/03/2019 7:05 AM  [name]  has presented today for surgery, with the diagnosis of morbid obesity.  The various methods of treatment have been discussed with the patient and family. After consideration of risks, benefits and other options for treatment, the patient has consented to  Procedure(s): LAPAROSCOPIC ROUX-EN-Y GASTRIC BYPASS WITH UPPER ENDOSCOPY (N/A) as a surgical intervention.  The patient's history has been reviewed, patient examined, no change in status, stable for surgery.  I have reviewed the patient's chart and labs.  Questions were answered to the patient's satisfaction.     [name]

## 2019-12-04 ENCOUNTER — Encounter (HOSPITAL_COMMUNITY): Payer: Self-pay | Admitting: Surgery

## 2019-12-04 LAB — CBC WITH DIFFERENTIAL/PLATELET
Abs Immature Granulocytes: 0.01 10*3/uL (ref 0.00–0.07)
Basophils Absolute: 0 10*3/uL (ref 0.0–0.1)
Basophils Relative: 0 %
Eosinophils Absolute: 0 10*3/uL (ref 0.0–0.5)
Eosinophils Relative: 0 %
HCT: 45 % (ref 39.0–52.0)
Hemoglobin: 14.6 g/dL (ref 13.0–17.0)
Immature Granulocytes: 0 %
Lymphocytes Relative: 17 %
Lymphs Abs: 1.2 10*3/uL (ref 0.7–4.0)
MCH: 25.1 pg — ABNORMAL LOW (ref 26.0–34.0)
MCHC: 32.4 g/dL (ref 30.0–36.0)
MCV: 77.5 fL — ABNORMAL LOW (ref 80.0–100.0)
Monocytes Absolute: 0.7 10*3/uL (ref 0.1–1.0)
Monocytes Relative: 10 %
Neutro Abs: 5.2 10*3/uL (ref 1.7–7.7)
Neutrophils Relative %: 73 %
Platelets: 273 10*3/uL (ref 150–400)
RBC: 5.81 MIL/uL (ref 4.22–5.81)
RDW: 16.6 % — ABNORMAL HIGH (ref 11.5–15.5)
WBC: 7.1 10*3/uL (ref 4.0–10.5)
nRBC: 0 % (ref 0.0–0.2)

## 2019-12-04 MED ORDER — PANTOPRAZOLE SODIUM 40 MG PO TBEC: 40.0000 mg | DELAYED_RELEASE_TABLET | Freq: Every day | ORAL | 0 refills | Status: DC

## 2019-12-04 MED ORDER — ACETAMINOPHEN 500 MG PO TABS: 1000.0000 mg | ORAL_TABLET | Freq: Three times a day (TID) | ORAL | 0 refills | Status: AC

## 2019-12-04 MED ORDER — PANTOPRAZOLE SODIUM 40 MG PO TBEC: 40.0000 mg | DELAYED_RELEASE_TABLET | Freq: Every day | ORAL | Status: DC

## 2019-12-04 MED ORDER — ONDANSETRON 4 MG PO TBDP: 4.0000 mg | ORAL_TABLET | Freq: Four times a day (QID) | ORAL | 0 refills | Status: DC | PRN

## 2019-12-04 MED ORDER — OXYCODONE HCL 5 MG PO TABS: 5.0000 mg | ORAL_TABLET | Freq: Four times a day (QID) | ORAL | 0 refills | Status: DC | PRN

## 2019-12-04 NOTE — Discharge Summary (Signed)
Physician Discharge Summary  Patient ID: [name] MRN: [id number] DOB/AGE: 1970/05/26 49 y.o.  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  These were advanced and he was ready for discharge on PD 1  Consults: none  Significant Diagnostic Studies: none    Discharge Exam: Blood pressure 128/90, pulse 88, temperature 98.7 F (37.1 C), resp. rate 20, height 5\' 10"  (1.778 m), weight 115.9 kg, SpO2 97 %. Incisions OK  Disposition: Discharge disposition: 01-Home or Self Care       Discharge Instructions     Ambulate hourly while awake   Complete by: As directed    Call MD for:  difficulty breathing, headache or visual disturbances   Complete by: As directed    Call MD for:  persistant dizziness or light-headedness   Complete by: As directed    Call MD for:  persistant nausea and vomiting   Complete by: As directed    Call MD for:  redness, tenderness, or signs of infection (pain, swelling, redness, odor or green/yellow discharge around incision site)   Complete by: As directed    Call MD for:  severe uncontrolled pain   Complete by: As directed    Call MD for:  temperature >101 F   Complete by: As directed    Diet bariatric full liquid   Complete by: As directed    Incentive spirometry   Complete by: As directed    Perform hourly while awake      Allergies as of 12/04/2019       Reactions   Atorvastatin Swelling   Tizanidine Hives   Cymbalta [duloxetine Hcl]    Stomach upset, difficulty urinating, decreased libido.        Medication List     STOP taking these medications    aspirin EC 81 MG tablet   diclofenac sodium 1 % Gel Commonly known  as: VOLTAREN   ibuprofen 600 MG tablet Commonly known as: ADVIL   Pepcid 20 MG tablet Generic drug: famotidine       TAKE these medications    acetaminophen 500 MG tablet Commonly known as: TYLENOL Take 1,000 mg by mouth every 6 (six) hours as needed for mild pain or moderate pain. What changed: Another medication with the same name was added. Make sure you understand how and when to take each.   acetaminophen 500 MG tablet Commonly known as: TYLENOL Take 2 tablets (1,000 mg total) by mouth every 8 (eight) hours for 5 days. What changed: You were already taking a medication with the same name, and this prescription was added. Make sure you understand how and when to take each.   cetirizine 10 MG tablet Commonly known as: ZYRTEC Take 10 mg by mouth daily as needed for allergies.   cyclobenzaprine 10 MG tablet Commonly known as: FLEXERIL Take 10 mg by mouth 3 (three) times daily.   Emergen-C Electro Mix Pack Take 1 Package by mouth daily.   Farxiga 10 MG Tabs tablet Generic drug: dapagliflozin propanediol Take 10 mg by mouth daily. Notes to patient: Monitor Blood Sugar Frequently and keep a log for primary care physician, you may need to adjust medication dosage  with rapid weight loss.     fluticasone 50 MCG/ACT nasal spray Commonly known as: FLONASE Place 2 sprays into both nostrils daily as needed for allergies or rhinitis.   metFORMIN 500 MG tablet Commonly known as: GLUCOPHAGE TAKE 2 TABLETS BY MOUTH TWICE A DAY WITH A MEAL What changed:  how much to take how to take this when to take this additional instructions   ondansetron 4 MG disintegrating tablet Commonly known as: ZOFRAN-ODT Take 1 tablet (4 mg total) by mouth every 6 (six) hours as needed for nausea or vomiting.   oxyCODONE 5 MG immediate release tablet Commonly known as: Oxy IR/ROXICODONE Take 1 tablet (5 mg total) by mouth every 6 (six) hours as needed for severe pain.   oxyCODONE-acetaminophen  10-325 MG tablet Commonly known as: PERCOCET Take 1 tablet by mouth every 6 (six) hours as needed for pain.   pantoprazole 40 MG tablet Commonly known as: PROTONIX Take 1 tablet (40 mg total) by mouth daily.   rosuvastatin 20 MG tablet Commonly known as: CRESTOR TAKE 1 TABLET BY MOUTH EVERY DAY What changed: how much to take   sildenafil 100 MG tablet Commonly known as: VIAGRA Take 100 mg by mouth as needed for erectile dysfunction (Take one-half tablet by mouth as instructed (take 1 hour prior to sexual activity *do not exceed 1 dose per 24 hour period*)).   traZODone 100 MG tablet Commonly known as: DESYREL Take 200 mg by mouth at bedtime as needed for sleep.   venlafaxine XR 150 MG 24 hr capsule Commonly known as: EFFEXOR-XR Take 150 mg by mouth 2 (two) times daily.   vitamin B-12 100 MCG tablet Commonly known as: CYANOCOBALAMIN Take 1 tablet (100 mcg total) by mouth daily. What changed: how much to take   Vitamin D3 125 MCG (5000 UT) Caps Take 5,000 Units by mouth daily.        Follow-up Information     Sur[facility], [facility]. Go on 12/19/2019.   Specialty: General Surgery Why: at 9 am with Dr Luretha Murphy.  Please arrive 15 minutes prior to appointment.  Thank you Contact information: [address] [place] [address] [phone]         Hedda Slade, PA-C. Go on 01/23/2020.   Specialty: General Surgery Why: at 845 am,  Please arrive 15 minutes prior to appointment.  Thank you Contact information: [address] [place] [address] [phone]                 Signed: [name] 12/04/2019, 3:12 PM

## 2019-12-04 NOTE — Progress Notes (Signed)
Discharge instructions given to pt and all questions were answered.  

## 2019-12-04 NOTE — Progress Notes (Signed)
Pt has met his water goal. Pt wants to wait until breakfast time to start shake. Pt is ambulating, voiding, using IS. Pain controlled. No complaints.

## 2019-12-04 NOTE — Progress Notes (Signed)
Nutrition Note  RD consulted for diet education for patient s/p bariatric surgery. Bariatric nurse coordinator providing education.  If nutrition issues arise, please consult RD.   Aimi Essner, MS, RD, LDN Inpatient Clinical Dietitian Contact information available via Amion  

## 2019-12-04 NOTE — Progress Notes (Signed)
Patient alert and oriented, pain is controlled. Patient is tolerating fluids, advanced to protein shake today, patient is tolerating well. Reviewed Gastric Bypass discharge instructions with patient and patient is able to articulate understanding. Provided information on BELT program, Support Group and [facility] outpatient pharmacy. All questions answered, will continue to monitor.    

## 2019-12-04 NOTE — Progress Notes (Signed)
Patient alert and oriented, Post op day 1.  Provided support and encouragement.  Encouraged pulmonary toilet, ambulation and small sips of liquids.  Completed 12 ounces of bari clear fluid starting protein All questions answered.  Will continue to monitor. 

## 2019-12-09 ENCOUNTER — Telehealth (HOSPITAL_COMMUNITY): Payer: Self-pay

## 2019-12-09 NOTE — Telephone Encounter (Addendum)
Patient called to discuss post bariatric surgery follow up questions.  See below:   1.  Tell me about your pain and pain management?no abdominal pain just back pain  2.  Let's talk about fluid intake.  How much total fluid are you taking in?50 ounces of fluid  3.  How much protein have you taken in the last 2 days? 60 grams  4.  Have you had nausea?  Tell me about when have experienced nausea and what you did to help?occassional nausea   5.  Has the frequency or color changed with your urine?urinating frequently   6.  Tell me what your incisions look like?no problems  7.  Have you been passing gas? BM?had BM since discharge  8.  If a problem or question were to arise who would you call?  Do you know contact numbers for [facility], [facility], and [facility]?aware of how to contact services  9.  How has the walking going?walking  10.  How are your vitamins and calcium going?  How are you taking them?mvi and calcium have started.

## 2019-12-11 ENCOUNTER — Other Ambulatory Visit: Payer: Self-pay

## 2019-12-11 ENCOUNTER — Ambulatory Visit: Payer: BC Managed Care – PPO | Admitting: Family Medicine

## 2019-12-11 ENCOUNTER — Encounter: Payer: Self-pay | Admitting: Family Medicine

## 2019-12-11 VITALS — BP 122/88 | HR 102 | Temp 97.2°F | Ht 70.0 in | Wt 245.6 lb

## 2019-12-11 DIAGNOSIS — Z9884 Bariatric surgery status: Secondary | ICD-10-CM

## 2019-12-11 DIAGNOSIS — G894 Chronic pain syndrome: Secondary | ICD-10-CM

## 2019-12-11 DIAGNOSIS — E1169 Type 2 diabetes mellitus with other specified complication: Secondary | ICD-10-CM | POA: Diagnosis not present

## 2019-12-11 DIAGNOSIS — E538 Deficiency of other specified B group vitamins: Secondary | ICD-10-CM

## 2019-12-11 DIAGNOSIS — E1142 Type 2 diabetes mellitus with diabetic polyneuropathy: Secondary | ICD-10-CM | POA: Diagnosis not present

## 2019-12-11 DIAGNOSIS — G4733 Obstructive sleep apnea (adult) (pediatric): Secondary | ICD-10-CM

## 2019-12-11 DIAGNOSIS — F339 Major depressive disorder, recurrent, unspecified: Secondary | ICD-10-CM

## 2019-12-11 DIAGNOSIS — E785 Hyperlipidemia, unspecified: Secondary | ICD-10-CM

## 2019-12-11 DIAGNOSIS — Z23 Encounter for immunization: Secondary | ICD-10-CM | POA: Diagnosis not present

## 2019-12-11 DIAGNOSIS — F4312 Post-traumatic stress disorder, chronic: Secondary | ICD-10-CM

## 2019-12-11 NOTE — Patient Instructions (Signed)
Please return in 3 months for diabetes follow up  Please refer to the medication list: take these medications as directed .  If you have any questions or concerns, please don't hesitate to send me a message via MyChart or call the office at [phone]. Thank you for visiting with [place] today! It's our pleasure caring for you.  Today you were given your flu vaccination.  You are eligible to get the covid booster 6 months after your last vaccination.

## 2019-12-11 NOTE — Progress Notes (Signed)
Subjective  CC:  Chief Complaint  Patient presents with  . Follow-up    gastric bypass   . Medication Management    patient just wants to review his medication and discuss what he needs to do going forward    HPI: [name] is a 49 y.o. male who presents to the office today for follow up of diabetes and problems listed above in the chief complaint.   49 year old male with diabetes, hyperlipidemia and history of CVA s/p gastric bypass surgery.  He is recovering well.  Needs clarification on multiple medications.  No complications from surgery.  He is tolerating liquid diet well.  Has follow-up with nutrition next week.  Diabetes follow up: His diabetic control is reported as Unchanged.  Recent A1c was 7.2.  He was discharged on Metformin.  Told to stop [place].  His fasting sugars are showing excellent control averaging 80s to 90s. He denies exertional CP or SOB or symptomatic hypoglycemia. He denies foot sores does have chronic neuropathy.  Due for flu vaccine  Hyperlipidemia on statin.  History of CVA.  Goal LDL less than 70.  Chronic back pain on chronic narcotics persist.  Chronic major depression: Reports good control on high-dose Effexor.  Health maintenance: Due for physical.  Due for colonoscopy but will defer till fully recovered from surgery.   Wt Readings from Last 3 Encounters:  12/11/19 245 lb 9.6 oz (111.4 kg)  12/03/19 255 lb 9.6 oz (115.9 kg)  11/29/19 265 lb 5 oz (120.3 kg)    BP Readings from Last 3 Encounters:  12/11/19 122/88  12/04/19 128/90  11/29/19 (!) 148/87    Assessment  1. Status post gastric bypass for obesity   2. Type 2 diabetes mellitus with diabetic polyneuropathy, without long-term current use of insulin (HCC)   3. Hyperlipidemia associated with type 2 diabetes mellitus (HCC), on statin   4. Obstructive sleep apnea   5. Vitamin B 12 deficiency   6. Chronic pain syndrome   7. Major depression, recurrent, chronic (HCC)   8.  Chronic post-traumatic stress disorder (PTSD) after military combat      Plan   Diabetes is currently marginally controlled.  Continue Metformin and weight loss.  Recheck 3 months.  Adjust medications from there.  He should be able to come off medications and resolve diabetes.  We will check lab work at that time.  Hyperlipidemia: Continue statin.  Continue vitamins.  See medication list for updated med list.  No other changes made today.  Flu shot updated.  Follow up: 3 months for follow-up diabetes. No orders of the defined types were placed in this encounter.  No orders of the defined types were placed in this encounter.     Immunization History  Administered Date(s) Administered  . Influenza Split 09/13/2011  . Influenza Whole 11/18/2008  . Influenza,inj,Quad PF,6+ Mos 11/15/2012, 11/12/2013, 09/26/2016, 11/21/2017, 11/19/2018  . PFIZER SARS-COV-2 Vaccination 04/14/2019, 05/12/2019  . Pneumococcal Polysaccharide-23 12/22/2010  . Td 03/16/2009    Diabetes Related Lab Review: Lab Results  Component Value Date   HGBA1C 7.2 (H) 11/29/2019   HGBA1C 6.7 (A) 05/21/2019   HGBA1C 7.1 (H) 02/25/2019    Lab Results  Component Value Date   MICROALBUR <0.7 02/27/2019   Lab Results  Component Value Date   CREATININE 1.23 12/03/2019   BUN 16 11/29/2019   NA 139 11/29/2019   K 4.2 11/29/2019   CL 103 11/29/2019   CO2 25 11/29/2019   Lab Results  Component Value Date   CHOL 130 02/25/2019   CHOL 262 (H) 12/05/2017   CHOL 249 (H) 02/07/2014   Lab Results  Component Value Date   HDL 47.60 02/25/2019   HDL 52.00 12/05/2017   HDL 38.90 (L) 02/07/2014   Lab Results  Component Value Date   LDLCALC 69 02/25/2019   LDLCALC 182 (H) 12/05/2017   LDLCALC 182 (H) 02/07/2014   Lab Results  Component Value Date   TRIG 68.0 02/25/2019   TRIG 141.0 12/05/2017   TRIG 139.0 02/07/2014   Lab Results  Component Value Date   CHOLHDL 3 02/25/2019   CHOLHDL 5 12/05/2017    CHOLHDL 6 02/07/2014   Lab Results  Component Value Date   LDLDIRECT 148.1 11/04/2010   LDLDIRECT 207.7 02/08/2007   The 10-year ASCVD risk score Denman George DC Jr., et al., 2013) is: 8.2%   Values used to calculate the score:     Age: 66 years     Sex: Male     Is Non-Hispanic African American: Yes     Diabetic: Yes     Tobacco smoker: No     Systolic Blood Pressure: 122 mmHg     Is BP treated: No     HDL Cholesterol: 47.6 mg/dL     Total Cholesterol: 130 mg/dL I have reviewed the PMH, Fam and Soc history. Patient Active Problem List   Diagnosis Date Noted  . Class 2 severe obesity due to excess calories with serious comorbidity and body mass index (BMI) of 38.0 to 38.9 in adult [facility]) 03/29/2018    Priority: High  . Chronic post-traumatic stress disorder (PTSD) after military combat 06/03/2017    Priority: High  . Major depression, recurrent, chronic (HCC) 01/01/2017    Priority: High  . Chronic pain syndrome 09/03/2016    Priority: High  . Type 2 diabetes mellitus with diabetic polyneuropathy, without long-term current use of insulin (HCC) 12/22/2010    Priority: High    dxd in 2009   . Hyperlipidemia associated with type 2 diabetes mellitus (HCC), on statin 08/23/2006    Priority: High  . Obstructive sleep apnea 08/23/2006    Priority: High    NPSG 2006:  AHI 78/hr. Auto optimized to 13-14 cm.  Autoset 01/2011 to 13 cm.   . Chronic low back pain 08/23/2006    Priority: High    Using back brace.  Using as needed Percocet.    . Seizure disorder (HCC) 08/23/2006    Priority: High  . History of CVA (cerebrovascular accident) 08/23/2006    Priority: High  . Insomnia 07/15/2015    Priority: Medium  . BPH (benign prostatic hyperplasia) 11/15/2012    Priority: Medium  . Gout 08/23/2006    Priority: Medium  . Vitamin B 12 deficiency 12/09/2017    Priority: Low  . Allergic rhinitis 08/23/2006    Priority: Low    Generally controlled with Zyrtec.   . Lap roux en Y  gastric bypass Nov 2021 12/03/2019  . Inflammatory spondylopathy of lumbar region (HCC) 09/02/2017  . Chronic right shoulder pain 06/03/2017  . Trigger thumb, right thumb 05/29/2015  . Carpal tunnel syndrome on right 02/06/2015  . Cervical spondylosis without myelopathy 02/06/2015  . Lumbar facet arthropathy 08/02/2011  . Lumbar spondylosis 08/02/2011  . Chronic pain of both knees 12/08/2008    Severe. Daily pain 8/10, down to 7/10 with antiinflammatory. Interfering with life. Limited exercise due to pain.        Social History: Patient  reports that he quit smoking about 28 years ago. His smoking use included cigarettes. He quit after 4.00 years of use. He has never used smokeless tobacco. He reports that he does not drink alcohol and does not use drugs.  Review of Systems: Ophthalmic: negative for eye pain, loss of vision or double vision Cardiovascular: negative for chest pain Respiratory: negative for SOB or persistent cough Gastrointestinal: negative for abdominal pain Genitourinary: negative for dysuria or gross hematuria MSK: negative for foot lesions Neurologic: negative for weakness or gait disturbance  Objective  Vitals: BP 122/88   Pulse (!) 102   Temp (!) 97.2 F (36.2 C) (Temporal)   Ht 5\' 10"  (1.778 m)   Wt 245 lb 9.6 oz (111.4 kg)   SpO2 97%   BMI 35.24 kg/m  General: well appearing, no acute distress  Psych:  Alert and oriented, normal mood and affect Cardiovascular:  Nl S1 and S2, RRR without murmur, gallop or rub. no edema Respiratory:  Good breath sounds bilaterally, CTAB with normal effort, no rales Gastrointestinal: normal BS, soft, nontender Neurologic:   Mental status is normal. normal gait     Diabetic education: ongoing education regarding chronic disease management for diabetes was given today. We continue to reinforce the ABC's of diabetic management: A1c (<7 or 8 dependent upon patient), tight blood pressure control, and cholesterol  management with goal LDL < 100 minimally. We discuss diet strategies, exercise recommendations, medication options and possible side effects. At each visit, we review recommended immunizations and preventive care recommendations for diabetics and stress that good diabetic control can prevent other problems. See below for this patient's data.    Commons side effects, risks, benefits, and alternatives for medications and treatment plan prescribed today were discussed, and the patient expressed understanding of the given instructions. Patient is instructed to call or message via MyChart if he/she has any questions or concerns regarding our treatment plan. No barriers to understanding were identified. We discussed Red Flag symptoms and signs in detail. Patient expressed understanding regarding what to do in case of urgent or emergency type symptoms.   Medication list was reconciled, printed and provided to the patient in AVS. Patient instructions and summary information was reviewed with the patient as documented in the AVS. This note was prepared with assistance of Dragon voice recognition software. Occasional wrong-word or sound-a-like substitutions may have occurred due to the inherent limitations of voice recognition software  This visit occurred during the SARS-CoV-2 public health emergency.  Safety protocols were in place, including screening questions prior to the visit, additional usage of staff PPE, and extensive cleaning of exam room while observing appropriate contact time as indicated for disinfecting solutions.

## 2019-12-17 ENCOUNTER — Encounter: Payer: BC Managed Care – PPO | Attending: Surgery | Admitting: Skilled Nursing Facility1

## 2019-12-17 ENCOUNTER — Other Ambulatory Visit: Payer: Self-pay

## 2019-12-17 DIAGNOSIS — E669 Obesity, unspecified: Secondary | ICD-10-CM | POA: Diagnosis not present

## 2019-12-18 NOTE — Progress Notes (Signed)
2 Week Post-Operative Nutrition Class   Patient was seen on 12/17/2019 for Post-Operative Nutrition education at [facility]    Surgery date: 12/03/2019 Surgery type: RYGB  Start weight at [facility]: 265.4 lbs  Weight today: 243.3 lbs   Body Composition Scale 12/17/2019  Total Body Fat % 30.3  Visceral Fat 21  Fat-Free Mass % 69.6   Total Body Water % 50.6   Muscle-Mass lbs 45.8  Body Fat Displacement          Torso  lbs 45.7         Left Leg  lbs 9.1         Right Leg  lbs 9.1         Left Arm  lbs 4.5         Right Arm   lbs 4.5     The following the learning objectives were met by the patient during this course:  Identifies Phase 3 (Soft, High Proteins) Dietary Goals and will begin from 2 weeks post-operatively to 2 months post-operatively  Identifies appropriate sources of fluids and proteins   Identifies appropriate fat sources and healthy verses unhealthy fat types    States protein recommendations and appropriate sources post-operatively  Identifies the need for appropriate texture modifications, mastication, and bite sizes when consuming solids  Identifies appropriate multivitamin and calcium sources post-operatively  Describes the need for physical activity post-operatively and will follow MD recommendations  States when to call healthcare provider regarding medication questions or post-operative complications   Handouts given during class include:  Phase 3A: Soft, High Protein Diet Handout  Phase 3 High Protein Meals  Healthy Fats   Follow-Up Plan: Patient will follow-up at [facility] in 6 weeks for 2 month post-op nutrition visit for diet advancement per MD.Dietitian called pt to assess their understanding of the pre-op nutrition recommendations through the teach back method to ensure the pts knowledge readiness in preparation for surgery.

## 2019-12-24 ENCOUNTER — Telehealth: Payer: Self-pay | Admitting: Skilled Nursing Facility1

## 2019-12-24 NOTE — Telephone Encounter (Signed)
RD called pt to verify fluid intake once starting soft, solid proteins 2 week post-bariatric surgery.   Daily Fluid intake: 64+ Daily Protein intake: 80+  Concerns/issues:   Pt states he threw up due to not chewing well enough one time.

## 2020-01-15 ENCOUNTER — Other Ambulatory Visit: Payer: Self-pay

## 2020-01-15 ENCOUNTER — Encounter: Payer: BC Managed Care – PPO | Attending: Surgery | Admitting: Skilled Nursing Facility1

## 2020-01-15 DIAGNOSIS — Z6838 Body mass index (BMI) 38.0-38.9, adult: Secondary | ICD-10-CM | POA: Diagnosis not present

## 2020-01-15 DIAGNOSIS — E1142 Type 2 diabetes mellitus with diabetic polyneuropathy: Secondary | ICD-10-CM | POA: Diagnosis not present

## 2020-01-15 NOTE — Progress Notes (Signed)
Bariatric Nutrition Follow-Up Visit Medical Nutrition Therapy    Post-Operative RYGB Surgery Surgery Date: 12/03/2019  Pt's Expectations of Surgery/ Goals: to lose weight  NUTRITION ASSESSMENT  Anthropometrics  Surgery date: 12/03/2019 Surgery type: RYGB  Start weight at [facility]: 265.4 lbs  Weight today: 222.6 lbs   Body Composition Scale 12/17/2019 01/15/2020  Total Body Fat % 30.3 27  Visceral Fat 21 18  Fat-Free Mass % 69.6 72.9   Total Body Water % 50.6 53.9   Muscle-Mass lbs 45.8 41.9  Body Fat Displacement           Torso  lbs 45.7 37.3         Left Leg  lbs 9.1 7.4         Right Leg  lbs 9.1 7.4         Left Arm  lbs 4.5 3.7         Right Arm   lbs 4.5 3.7   Clinical  Medical hx: DM, OSA, gout Medications: see list Labs:    Lifestyle & Dietary Hx  Pt states his blood sugars have been in the 90's. Pt states he chews sugar free gum a lot wondering if this needs to change.  Pt states he has thrown up from eating too fast a couple times. 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 Pt states he does want to include more non starchy vegetables. Pt states he wants to eat more plant based proteins. Pt states he is excited to stop with the protein shakes he was thinking he had to do one a day.   Estimated daily fluid intake: 64+ oz Estimated daily protein intake: 80+ g Supplements: multi and calcium and probiotic  Current average weekly physical activity: some resistance bands and stationary biking   24-Hr Dietary Recall First Meal: coffee  Snack: protein shake Second Meal: 1-2 boiled eggs Snack: 11-12: chili or soup Third Meal 3: tuna or chicken Snack 7-8pm: [place] chili Beverages: 2  Cups coffee + sugar free creamer, water, water + flavoring, unsweet tea  Post-Op Goals/ Signs/ Symptoms Using straws: no Drinking while eating: no Chewing/swallowing difficulties: no Changes in vision: no Changes to mood/headaches: no Hair  loss/changes to skin/nails: no Difficulty focusing/concentrating: no Sweating: no Dizziness/lightheadedness: no Palpitations: no  Carbonated/caffeinated beverages: no N/V/D/C/Gas: no Abdominal pain: no Dumping syndrome: no    NUTRITION DIAGNOSIS  Overweight/obesity ([place]-3.3) related to past poor dietary habits and physical inactivity as evidenced by completed bariatric surgery and following dietary guidelines for continued weight loss and healthy nutrition status.     NUTRITION INTERVENTION Nutrition counseling (C-1) and education (E-2) to facilitate bariatric surgery goals, including: . Diet advancement to the next phase now including non starchy vegetables . The importance of consuming adequate calories as well as certain nutrients daily due to the body's need for essential vitamins, minerals, and fats . The importance of daily physical activity and to reach a goal of at least 150 minutes of moderate to vigorous physical activity weekly (or as directed by their physician) due to benefits such as increased musculature and improved lab values . The importance of intuitive eating specifically learning hunger-satiety cues and understanding the importance of learning a new body: The importance of mindful eating to avoid grazing behaviors   Goals: -Continue to aim for a minimum of 64 fluid ounces 7 days a week with at least 30 ounces being plain water -Eat non-starchy vegetables 2 times  a day 7 days a week -Start out with soft cooked vegetables today and tomorrow; if tolerated begin to eat raw vegetables or cooked including salads -Eat your 3 ounces of protein first then start in on your non-starchy vegetables; once you understand how much of your meal leads to satisfaction and not full while still eating 3 ounces of protein and non-starchy vegetables you can eat them in any order  -Continue to aim for 30 minutes of activity at least 5 times a week -Do NOT cook with/add to your food: alfredo  sauce, cheese sauce, barbeque sauce, ketchup, fat back, butter, bacon grease, grease, Crisco, OR SUGAR   Handouts Provided Include   Non starchy vegetables  Learning Style & Readiness for Change Teaching method utilized: Visual & Auditory  Demonstrated degree of understanding via: Teach Back  Readiness Level: Action Barriers to learning/adherence to lifestyle change: none identified   RD's Notes for Next Visit . Assess adherence to pt chosen goals   MONITORING & EVALUATION Dietary intake, weekly physical activity, body weight  Next Steps Patient is to follow-up in 2 months

## 2020-01-29 ENCOUNTER — Ambulatory Visit: Payer: BC Managed Care – PPO | Admitting: Skilled Nursing Facility1

## 2020-02-01 HISTORY — PX: LUMBAR LAMINECTOMY/DECOMPRESSION MICRODISCECTOMY: SHX5026

## 2020-02-12 ENCOUNTER — Encounter: Payer: Self-pay | Admitting: Family Medicine

## 2020-02-12 ENCOUNTER — Other Ambulatory Visit: Payer: Self-pay

## 2020-02-12 DIAGNOSIS — M5442 Lumbago with sciatica, left side: Secondary | ICD-10-CM

## 2020-02-12 DIAGNOSIS — G8929 Other chronic pain: Secondary | ICD-10-CM

## 2020-02-12 NOTE — Telephone Encounter (Signed)
Last refill: 02/27/19 #120, 0 Last OV: 12/11/19 dx. Follow up

## 2020-02-13 ENCOUNTER — Other Ambulatory Visit: Payer: Self-pay

## 2020-02-13 ENCOUNTER — Encounter: Payer: BC Managed Care – PPO | Attending: Surgery | Admitting: Skilled Nursing Facility1

## 2020-02-13 DIAGNOSIS — E1142 Type 2 diabetes mellitus with diabetic polyneuropathy: Secondary | ICD-10-CM | POA: Diagnosis not present

## 2020-02-13 DIAGNOSIS — E669 Obesity, unspecified: Secondary | ICD-10-CM | POA: Diagnosis not present

## 2020-02-13 NOTE — Progress Notes (Signed)
Bariatric Nutrition Follow-Up Visit Medical Nutrition Therapy    Post-Operative RYGB Surgery Surgery Date: 12/03/2019  Pt's Expectations of Surgery/ Goals: to lose weight  NUTRITION ASSESSMENT  Anthropometrics  Surgery date: 12/03/2019 Surgery type: RYGB  Start weight at [facility]: 265.4 lbs  Weight today: 218 lbs   Body Composition Scale 12/17/2019 01/15/2020 02/13/2020  Total Body Fat % 30.3 27 26.2  Visceral Fat 21 18 17   Fat-Free Mass % 69.6 72.9 73.7   Total Body Water % 50.6 53.9 54.7   Muscle-Mass lbs 45.8 41.9 41  Body Fat Displacement            Torso  lbs 45.7 37.3 35.5         Left Leg  lbs 9.1 7.4 7.1         Right Leg  lbs 9.1 7.4 7.1         Left Arm  lbs 4.5 3.7 3.5         Right Arm   lbs 4.5 3.7 3.5   Clinical  Medical hx: DM, OSA, gout Medications: see list Labs:    Lifestyle & Dietary Hx  Pt states he really does not want to lose anymore weight. Pt states with the cold weather he has had some back pain but with good energy. Pt states he does not sleep well due to back pain and sleep apnea (states he wears his C-PAP nightly). Pt states he starts BELT in the next week. Pt states he struggles with motivation for physical activity.  Pt states he does not eat beef. Pt states ice burg lettuce, greens, cabbage causing diarrhea.  Pt states he wakes around 6 am usually before due to pain.  Pt states his blood sugars 74-90   Estimated daily fluid intake: 64+ oz Estimated daily protein intake: 80+ g Supplements: multi and calcium and probiotic  Current average weekly physical activity: some resistance bands and stationary biking; trying to be more active in his daily routine, walking the dog  24-Hr Dietary Recall First Meal: coffee  Snack: protein shake or 2 boiled eggs or protein bar Second Meal: 1-2 boiled eggs Snack: 11-12: chili or soup + green beans  Third Meal 3: tuna or chicken Snack 7-8pm: chili Beverages: 2  Cups coffee + sometimes sugar free  creamer, water, water + flavoring, unsweet tea  Post-Op Goals/ Signs/ Symptoms Using straws: no Drinking while eating: no Chewing/swallowing difficulties: no Changes in vision: no Changes to mood/headaches: no Hair loss/changes to skin/nails: no Difficulty focusing/concentrating: no Sweating: no Dizziness/lightheadedness: no Palpitations: no  Carbonated/caffeinated beverages: no N/V/D/C/Gas: no Abdominal pain: no Dumping syndrome: no    NUTRITION DIAGNOSIS  Overweight/obesity ([place]-3.3) related to past poor dietary habits and physical inactivity as evidenced by completed bariatric surgery and following dietary guidelines for continued weight loss and healthy nutrition status.     NUTRITION INTERVENTION Nutrition counseling (C-1) and education (E-2) to facilitate bariatric surgery goals, including: . Diet advancement to the next phase now including non starchy vegetables . The importance of consuming adequate calories as well as certain nutrients daily due to the body's need for essential vitamins, minerals, and fats . The importance of daily physical activity and to reach a goal of at least 150 minutes of moderate to vigorous physical activity weekly (or as directed by their physician) due to benefits such as increased musculature and improved lab values . The importance of intuitive eating specifically learning hunger-satiety cues and understanding the importance of learning a new body:  The importance of mindful eating to avoid grazing behaviors  Encouraged patient to honor their body's internal hunger and fullness cues.  Throughout the day, check in mentally and rate hunger. Stop eating when satisfied not full regardless of how much food is left on the plate.  Get more if still hungry 20-30 minutes later.  The key is to honor satisfaction so throughout the meal, rate fullness factor and stop when comfortably satisfied not physically full. The key is to honor hunger and fullness without  any feelings of guilt or shame.  Pay attention to what the internal cues are, rather than any external factors. This will enhance the confidence you have in listening to your own body and following those internal cues enabling you to increase how often you eat when you are hungry not out of appetite and stop when you are satisfied not full.  Encouraged pt to continue to eat balanced meals inclusive of non starchy vegetables 2 times a day 7 days a week Encouraged pt to choose lean protein sources: limiting beef, pork, sausage, hotdogs, and lunch meat Encourage pt to choose healthy fats such as plant based limiting animal fats . Encouraged pt to continue to drink a minium 64 fluid ounces with half being plain water to satisfy proper hydration   Goals: -continue to limit simple carbohydrates -continue to limit processed meats  -continue to aim for 100g complex carbohydrates inclusive of 28 grams fiber -continue to aim for 80 grams of protein -continue to aim for non starchy vegetables 2-3 times a day -continue to aim for a minimum 64 fluid ounces per day -Follow up with your surgeon yearly -keep aware of signs/symptoms -Call this office as needed with any questions or concerns    Handouts Provided Include     Learning Style & Readiness for Change Teaching method utilized: Visual & Auditory  Demonstrated degree of understanding via: Teach Back  Readiness Level: Action Barriers to learning/adherence to lifestyle change: none identified   RD's Notes for Next Visit . Assess adherence to pt chosen goals   MONITORING & EVALUATION Dietary intake, weekly physical activity, body weight  Next Steps Patient is to follow-up

## 2020-02-14 NOTE — Telephone Encounter (Signed)
I believe he gets this through pain mgt? I last prescribed in 02/2019. Please clarify. Thanks.

## 2020-02-19 ENCOUNTER — Encounter (HOSPITAL_COMMUNITY): Payer: Self-pay | Admitting: Emergency Medicine

## 2020-02-19 ENCOUNTER — Ambulatory Visit (HOSPITAL_COMMUNITY)
Admission: EM | Admit: 2020-02-19 | Discharge: 2020-02-19 | Disposition: A | Discharge status: Home / Self Care | Payer: BC Managed Care – PPO | Attending: Emergency Medicine | Admitting: Emergency Medicine

## 2020-02-19 ENCOUNTER — Other Ambulatory Visit: Payer: Self-pay

## 2020-02-19 DIAGNOSIS — M5442 Lumbago with sciatica, left side: Secondary | ICD-10-CM | POA: Diagnosis not present

## 2020-02-19 MED ORDER — METHOCARBAMOL 500 MG PO TABS: 500.0000 mg | ORAL_TABLET | Freq: Two times a day (BID) | ORAL | 0 refills | Status: DC

## 2020-02-19 NOTE — ED Provider Notes (Signed)
[facility]    CSN: [id number] Arrival date & time: 02/19/20  1054      History   Chief Complaint Chief Complaint  Patient presents with  . Back Pain    HPI [name] is a 50 y.o. male.   Patient presents with 1 week history of low back pain which radiates to his left buttock.  No falls or injury.  He denies numbness, weakness, loss of bowel/bladder control, saddle anesthesia, rash, redness, lesions, bruising, or other symptoms.  Treatment attempted at home with Percocet, cyclobenzaprine, TENS unit, warm compresses.  His medical history includes chronic low back pain, chronic knee pain, chronic pain syndrome, depression, anxiety, chronic PTSD, chronic shoulder pain, CVA, diabetes, gastric bypass November 2021, BPH.  The history is provided by the patient and medical records.    Past Medical History:  Diagnosis Date  . Adrenal abnormality (HCC)   . Allergy   . Anxiety   . Arthritis   . Atypical chest pain   . BPH (benign prostatic hypertrophy)   . Complication of anesthesia    Hard time waking up after neck surgery  . Depression   . DM (diabetes mellitus) (HCC)   . GERD (gastroesophageal reflux disease)   . Gout   . H/O: CVA (cardiovascular accident)   . Hyperlipidemia   . Knee pain, bilateral   . LBP (low back pain)   . OSA (obstructive sleep apnea)   . Plantar fasciitis   . Seizure disorder [facility])     Patient Active Problem List   Diagnosis Date Noted  . 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 12/03/2019  . Class 2 severe obesity due to excess calories with serious comorbidity and body mass index (BMI) of 38.0 to 38.9 in adult (HCC) 03/29/2018  . Vitamin B 12 deficiency 12/09/2017  . Inflammatory spondylopathy of lumbar region (HCC) 09/02/2017  . Chronic post-traumatic stress disorder (PTSD) after military combat 06/03/2017  . Chronic right shoulder pain 06/03/2017  . Major depression, recurrent, chronic (HCC) 01/01/2017  . Chronic pain  syndrome 09/03/2016  . Insomnia 07/15/2015  . Trigger thumb, right thumb 05/29/2015  . Carpal tunnel syndrome on right 02/06/2015  . Cervical spondylosis without myelopathy 02/06/2015  . BPH (benign prostatic hyperplasia) 11/15/2012  . Lumbar facet arthropathy 08/02/2011  . Lumbar spondylosis 08/02/2011  . Type 2 diabetes mellitus with diabetic polyneuropathy, without long-term current use of insulin (HCC) 12/22/2010  . Chronic pain of both knees 12/08/2008  . Hyperlipidemia associated with type 2 diabetes mellitus (HCC), on statin 08/23/2006  . Gout 08/23/2006  . Obstructive sleep apnea 08/23/2006  . Allergic rhinitis 08/23/2006  . Chronic low back pain 08/23/2006  . Seizure disorder (HCC) 08/23/2006  . History of CVA (cerebrovascular accident) 08/23/2006    Past Surgical History:  Procedure Laterality Date  . CARPAL TUNNEL RELEASE    . ENDOSCOPIC PLANTAR FASCIOTOMY    . GASTRIC ROUX-EN-Y N/A 12/03/2019   Procedure: LAPAROSCOPIC ROUX-EN-Y GASTRIC BYPASS WITH UPPER ENDOSCOPY;  Surgeon: Luretha Murphy, MD;  Location: [facility];  Service: General;  Laterality: N/A;  . HEEL SPUR SURGERY Left   . NECK SURGERY     Titanium Plate in Vertebrae  . SHOULDER SURGERY Right   . TRIGGER FINGER RELEASE Right 05/14/2015   Procedure: RIGHT THUMB TRIGGER RELEASE ;  Surgeon: Betha Loa, MD;  Location: [facility];  Service: Orthopedics;  Laterality: Right;       Home Medications    Prior to  Admission medications   Medication Sig Start Date End Date Taking? Authorizing Provider  methocarbamol (ROBAXIN) 500 MG tablet Take 1 tablet (500 mg total) by mouth 2 (two) times daily. 02/19/20  Yes Mickie Bail, NP  acetaminophen (TYLENOL) 500 MG tablet Take 1,000 mg by mouth every 6 (six) hours as needed for mild pain or moderate pain.     [provider]  cetirizine (ZYRTEC) 10 MG tablet Take 10 mg by mouth daily as needed for allergies.     [provider]   Cholecalciferol (VITAMIN D3) 125 MCG (5000 UT) CAPS Take 5,000 Units by mouth daily.  Patient not taking: Reported on 12/11/2019    [provider]  cyclobenzaprine (FLEXERIL) 10 MG tablet Take 10 mg by mouth 3 (three) times daily.    [provider]  fluticasone (FLONASE) 50 MCG/ACT nasal spray Place 2 sprays into both nostrils daily as needed for allergies or rhinitis.  Patient not taking: Reported on 12/11/2019    [provider]  metFORMIN (GLUCOPHAGE) 500 MG tablet TAKE 2 TABLETS BY MOUTH TWICE A DAY WITH A MEAL Patient taking differently: Take 1,000 mg by mouth 2 (two) times daily with a meal.  04/01/19   Willow Ora, MD  oxyCODONE (OXY IR/ROXICODONE) 5 MG immediate release tablet Take 1 tablet (5 mg total) by mouth every 6 (six) hours as needed for severe pain. 12/04/19   Luretha Murphy, MD  oxyCODONE-acetaminophen (PERCOCET) 10-325 MG tablet Take 1 tablet by mouth every 6 (six) hours as needed for pain. 02/27/19   Willow Ora, MD  pantoprazole (PROTONIX) 40 MG tablet Take 1 tablet (40 mg total) by mouth daily. 12/04/19   Luretha Murphy, MD  rosuvastatin (CRESTOR) 20 MG tablet TAKE 1 TABLET BY MOUTH EVERY DAY Patient not taking: Reported on 12/11/2019 10/09/17   Helane Rima, DO  sildenafil (VIAGRA) 100 MG tablet Take 100 mg by mouth as needed for erectile dysfunction (Take one-half tablet by mouth as instructed (take 1 hour prior to sexual activity *do not exceed 1 dose per 24 hour period*)). Patient not taking: Reported on 12/11/2019    [provider]  venlafaxine XR (EFFEXOR-XR) 150 MG 24 hr capsule Take 150 mg by mouth 2 (two) times daily.  Patient not taking: Reported on 12/11/2019 10/05/17   [provider]  vitamin B-12 (CYANOCOBALAMIN) 100 MCG tablet Take 1 tablet (100 mcg total) by mouth daily. Patient not taking: Reported on 12/11/2019 02/27/19   Willow Ora, MD    Family History Family History  Problem Relation Age of Onset   . Diabetes Father   . Hypertension Father   . Heart attack Maternal Grandfather   . Cancer Maternal Uncle   . Allergies Daughter     Social History Social History   Tobacco Use  . Smoking status: Former Smoker    Years: 4.00    Types: Cigarettes    Quit date: 12/22/1990    Years since quitting: 29.1  . Smokeless tobacco: Never Used  . Tobacco comment: 1-2 packs a week  Vaping Use  . Vaping Use: Never used  Substance Use Topics  . Alcohol use: No    Alcohol/week: 0.0 standard drinks  . Drug use: No     Allergies   Atorvastatin, Tizanidine, and Cymbalta [duloxetine hcl]   Review of Systems Review of Systems  Constitutional: Negative for chills and fever.  HENT: Negative for ear pain and sore throat.   Eyes: Negative for pain and visual  disturbance.  Respiratory: Negative for cough and shortness of breath.   Cardiovascular: Negative for chest pain and palpitations.  Gastrointestinal: Negative for abdominal pain and vomiting.  Genitourinary: Negative for dysuria and hematuria.  Musculoskeletal: Positive for back pain. Negative for arthralgias.  Skin: Negative for color change and rash.  Neurological: Negative for syncope, weakness and numbness.  All other systems reviewed and are negative.    Physical Exam Triage Vital Signs ED Triage Vitals  Enc Vitals Group     BP      Pulse      Resp      Temp      Temp src      SpO2      Weight      Height      Head Circumference      Peak Flow      Pain Score      Pain Loc      Pain Edu?      Excl. in GC?    No data found.  Updated Vital Signs BP 114/67 (BP Location: Right Arm)   Pulse 88   Temp 98.4 F (36.9 C) (Oral)   Resp 18   SpO2 100%   Visual Acuity Right Eye Distance:   Left Eye Distance:   Bilateral Distance:    Right Eye Near:   Left Eye Near:    Bilateral Near:     Physical Exam Vitals and nursing note reviewed.  Constitutional:      General: He is not in acute distress.     Appearance: He is well-developed and well-nourished. He is not ill-appearing.  HENT:     Head: Normocephalic and atraumatic.     Mouth/Throat:     Mouth: Mucous membranes are moist.  Eyes:     Conjunctiva/sclera: Conjunctivae normal.  Cardiovascular:     Rate and Rhythm: Normal rate and regular rhythm.     Heart sounds: Normal heart sounds.  Pulmonary:     Effort: Pulmonary effort is normal. No respiratory distress.     Breath sounds: Normal breath sounds.  Abdominal:     General: Bowel sounds are normal.     Palpations: Abdomen is soft.     Tenderness: There is no abdominal tenderness. There is no right CVA tenderness, left CVA tenderness, guarding or rebound.  Musculoskeletal:        General: No swelling, tenderness, deformity or edema. Normal range of motion.     Cervical back: Neck supple.  Skin:    General: Skin is warm and dry.     Findings: No bruising, erythema, lesion or rash.  Neurological:     General: No focal deficit present.     Mental Status: He is alert and oriented to person, place, and time.     Sensory: No sensory deficit.     Motor: No weakness.     Gait: Gait normal.     Comments: Straight leg raise negative.  Psychiatric:        Mood and Affect: Mood and affect and mood normal.        Behavior: Behavior normal.      UC Treatments / Results  Labs (all labs ordered are listed, but only abnormal results are displayed) Labs Reviewed - No data to display  EKG   Radiology No results found.  Procedures Procedures (including critical care time)  Medications Ordered in UC Medications - No data to display  Initial Impression / Assessment and Plan / UC  Course  I have reviewed the triage vital signs and the nursing notes.  Pertinent labs & imaging results that were available during my care of the patient were reviewed by me and considered in my medical decision making (see chart for details).   Low back pain with sciatica.  Treating with  methocarbamol; precautions for drowsiness with this medication discussed; instructed patient not to take it with cyclobenzaprine.  Instructed patient to follow-up with his PCP or an orthopedist if his symptoms are not improving.  He agrees to plan of care.   Final Clinical Impressions(s) / UC Diagnoses   Final diagnoses:  Acute midline low back pain with left-sided sciatica     Discharge Instructions     Take the muscle relaxer as needed for muscle spasm; Do not drive, operate machinery, or drink alcohol with this medication as it may make you drowsy.    Follow up with your primary care provider or an orthopedist if your pain is not improving.        ED Prescriptions    Medication Sig Dispense Auth. Provider   methocarbamol (ROBAXIN) 500 MG tablet Take 1 tablet (500 mg total) by mouth 2 (two) times daily. 20 tablet Mickie Bailate, Raeven Pint H, NP     I have reviewed the PDMP during this encounter.   Mickie Bailate, Zakar Brosch H, NP 02/19/20 1242

## 2020-02-19 NOTE — ED Triage Notes (Signed)
Pt presents with low back. States has had chronic back pain for approx 30 years. States uses tens unit, muscle relaxers, and oxycodone but has gotten no relief.

## 2020-02-19 NOTE — Discharge Instructions (Addendum)
Take the muscle relaxer as needed for muscle spasm; Do not drive, operate machinery, or drink alcohol with this medication as it may make you drowsy.    Follow up with your primary care provider or an orthopedist if your pain is not improving.       

## 2020-03-02 ENCOUNTER — Other Ambulatory Visit: Payer: Self-pay | Admitting: Sports Medicine

## 2020-03-02 DIAGNOSIS — M545 Low back pain, unspecified: Secondary | ICD-10-CM

## 2020-03-03 ENCOUNTER — Ambulatory Visit: Payer: BC Managed Care – PPO | Admitting: Pulmonary Disease

## 2020-03-05 ENCOUNTER — Encounter: Payer: Self-pay | Admitting: Pulmonary Disease

## 2020-03-05 ENCOUNTER — Ambulatory Visit (INDEPENDENT_AMBULATORY_CARE_PROVIDER_SITE_OTHER): Payer: BC Managed Care – PPO | Admitting: Pulmonary Disease

## 2020-03-05 ENCOUNTER — Ambulatory Visit (INDEPENDENT_AMBULATORY_CARE_PROVIDER_SITE_OTHER): Payer: BC Managed Care – PPO | Admitting: Family Medicine

## 2020-03-05 ENCOUNTER — Other Ambulatory Visit: Payer: Self-pay

## 2020-03-05 ENCOUNTER — Encounter: Payer: Self-pay | Admitting: Family Medicine

## 2020-03-05 VITALS — BP 120/64 | HR 94 | Temp 97.6°F | Ht 70.0 in | Wt 208.0 lb

## 2020-03-05 VITALS — BP 110/78 | HR 89 | Temp 98.2°F | Resp 16 | Wt 208.6 lb

## 2020-03-05 DIAGNOSIS — F339 Major depressive disorder, recurrent, unspecified: Secondary | ICD-10-CM | POA: Diagnosis not present

## 2020-03-05 DIAGNOSIS — Z0000 Encounter for general adult medical examination without abnormal findings: Secondary | ICD-10-CM | POA: Diagnosis not present

## 2020-03-05 DIAGNOSIS — E538 Deficiency of other specified B group vitamins: Secondary | ICD-10-CM

## 2020-03-05 DIAGNOSIS — Z1159 Encounter for screening for other viral diseases: Secondary | ICD-10-CM | POA: Diagnosis not present

## 2020-03-05 DIAGNOSIS — F1190 Opioid use, unspecified, uncomplicated: Secondary | ICD-10-CM

## 2020-03-05 DIAGNOSIS — Z9884 Bariatric surgery status: Secondary | ICD-10-CM | POA: Diagnosis not present

## 2020-03-05 DIAGNOSIS — G4733 Obstructive sleep apnea (adult) (pediatric): Secondary | ICD-10-CM

## 2020-03-05 DIAGNOSIS — E785 Hyperlipidemia, unspecified: Secondary | ICD-10-CM | POA: Diagnosis not present

## 2020-03-05 DIAGNOSIS — G894 Chronic pain syndrome: Secondary | ICD-10-CM

## 2020-03-05 DIAGNOSIS — F4312 Post-traumatic stress disorder, chronic: Secondary | ICD-10-CM

## 2020-03-05 DIAGNOSIS — E1169 Type 2 diabetes mellitus with other specified complication: Secondary | ICD-10-CM

## 2020-03-05 DIAGNOSIS — E1142 Type 2 diabetes mellitus with diabetic polyneuropathy: Secondary | ICD-10-CM

## 2020-03-05 DIAGNOSIS — Z8673 Personal history of transient ischemic attack (TIA), and cerebral infarction without residual deficits: Secondary | ICD-10-CM

## 2020-03-05 LAB — COMPREHENSIVE METABOLIC PANEL
ALT: 21 U/L (ref 0–53)
AST: 13 U/L (ref 0–37)
Albumin: 4.2 g/dL (ref 3.5–5.2)
Alkaline Phosphatase: 49 U/L (ref 39–117)
BUN: 9 mg/dL (ref 6–23)
CO2: 30 mEq/L (ref 19–32)
Calcium: 10 mg/dL (ref 8.4–10.5)
Chloride: 103 mEq/L (ref 96–112)
Creatinine, Ser: 0.91 mg/dL (ref 0.40–1.50)
GFR: 98.12 mL/min (ref >60.00)
Glucose, Bld: 79 mg/dL (ref 70–99)
Potassium: 4.3 mEq/L (ref 3.5–5.1)
Sodium: 139 mEq/L (ref 135–145)
Total Bilirubin: 0.5 mg/dL (ref 0.2–1.2)
Total Protein: 7.7 g/dL (ref 6.0–8.3)

## 2020-03-05 LAB — CBC WITH DIFFERENTIAL/PLATELET
Basophils Absolute: 0 10*3/uL (ref 0.0–0.1)
Basophils Relative: 0.8 % (ref 0.0–3.0)
Eosinophils Absolute: 0.1 10*3/uL (ref 0.0–0.7)
Eosinophils Relative: 1.1 % (ref 0.0–5.0)
HCT: 42 % (ref 39.0–52.0)
Hemoglobin: 13.8 g/dL (ref 13.0–17.0)
Lymphocytes Relative: 36.6 % (ref 12.0–46.0)
Lymphs Abs: 1.8 10*3/uL (ref 0.7–4.0)
MCHC: 33 g/dL (ref 30.0–36.0)
MCV: 78.2 fl (ref 78.0–100.0)
Monocytes Absolute: 0.3 10*3/uL (ref 0.1–1.0)
Monocytes Relative: 7 % (ref 3.0–12.0)
Neutro Abs: 2.6 10*3/uL (ref 1.4–7.7)
Neutrophils Relative %: 54.5 % (ref 43.0–77.0)
Platelets: 241 10*3/uL (ref 150.0–400.0)
RBC: 5.37 Mil/uL (ref 4.22–5.81)
RDW: 16.6 % — ABNORMAL HIGH (ref 11.5–15.5)
WBC: 4.8 10*3/uL (ref 4.0–10.5)

## 2020-03-05 LAB — MICROALBUMIN / CREATININE URINE RATIO
Creatinine,U: 116.1 mg/dL
Microalb Creat Ratio: 0.6 mg/g (ref 0.0–30.0)
Microalb, Ur: <0.7 mg/dL (ref 0.0–1.9)

## 2020-03-05 LAB — LIPID PANEL
Cholesterol: 226 mg/dL — ABNORMAL HIGH (ref 0–200)
HDL: 55.8 mg/dL (ref >39.00)
LDL Cholesterol: 153 mg/dL — ABNORMAL HIGH (ref 0–99)
NonHDL: 169.77
Total CHOL/HDL Ratio: 4
Triglycerides: 86 mg/dL (ref 0.0–149.0)
VLDL: 17.2 mg/dL (ref 0.0–40.0)

## 2020-03-05 LAB — POCT GLYCOSYLATED HEMOGLOBIN (HGB A1C): Hemoglobin A1C: 5.4 % (ref 4.0–5.6)

## 2020-03-05 LAB — VITAMIN D 25 HYDROXY (VIT D DEFICIENCY, FRACTURES): VITD: 33.77 ng/mL (ref 30.00–100.00)

## 2020-03-05 LAB — TSH: TSH: 1.86 u[IU]/mL (ref 0.35–4.50)

## 2020-03-05 LAB — VITAMIN B12: Vitamin B-12: 825 pg/mL (ref 211–911)

## 2020-03-05 NOTE — Patient Instructions (Signed)
Please return in 3 months for diabetes follow up  I will release your lab results to you on your MyChart account with further instructions. Please reply with any questions.   Congratulations on your weight loss. Good luck at pulmonology today!  If you have any questions or concerns, please don't hesitate to send me a message via MyChart or call the office at [phone]. Thank you for visiting with [name] today! It's our pleasure caring for you.

## 2020-03-05 NOTE — Patient Instructions (Addendum)
You were seen today by Coral Ceo, NP  for:   [name] job working on weight loss.  Doing also!  We are very proud of you!  We will order a repeat sleep study to monitor whether or not you still need a CPAP and if you do have obstructive sleep apnea if the severity has changed.  For right now continue to use your CPAP until after completing the sleep study and then we will follow-up with you with those results  Take care and stay safe,  Candence Sease  1. Obstructive sleep apnea  - Split night study; Future  We recommend that you continue using your CPAP daily >>>Keep up the hard work using your device >>> Goal should be wearing this for the entire night that you are sleeping, at least 4 to 6 hours  Remember:  . Do not drive or operate heavy machinery if tired or drowsy.  . Please notify the supply company and office if you are unable to use your device regularly due to missing supplies or machine being broken.  . Work on maintaining a healthy weight and following your recommended nutrition plan  . Maintain proper daily exercise and movement  . Maintaining proper use of your device can also help improve management of other chronic illnesses such as: Blood pressure, blood sugars, and weight management.   BiPAP/ CPAP Cleaning:  >>>Clean weekly, with Dawn soap, and bottle brush.  Set up to air dry. >>> Wipe mask out daily with wet wipe or towelette    We recommend today:  Orders Placed This Encounter  Procedures  . Split night study    Standing Status:   Future    Standing Expiration Date:   09/02/2020    Order Specific Question:   Where should this test be performed:    Answer:   [facility]   Orders Placed This Encounter  Procedures  . Split night study   No orders of the defined types were placed in this encounter.   Follow Up:    Return in about 1 year (around 03/05/2021), or if symptoms worsen or fail to improve, for Follow up with Dr. Vassie Loll.   Notification of  test results are managed in the following manner: If there are  any recommendations or changes to the  plan of care discussed in office today,  we will contact you and let you know what they are. If you do not hear from [place], then your results are normal and you can view them through your  MyChart account , or a letter will be sent to you. Thank you again for trusting [place] with your care  - Thank you, [facility]    It is flu season:   >>> Best ways to protect herself from the flu: Receive the yearly flu vaccine, practice good hand hygiene washing with soap and also using hand sanitizer when available, eat a nutritious meals, get adequate rest, hydrate appropriately       Please contact the office if your symptoms worsen or you have concerns that you are not improving.   Thank you for choosing [facility] for your healthcare, and for allowing [place] to partner with you on your healthcare journey. I am thankful to be able to provide care to you today.   Elisha Headland FNP-C

## 2020-03-05 NOTE — Progress Notes (Signed)
Subjective  Chief Complaint  Patient presents with  . Annual Exam    Fasting, experienced several episodes of floaters bilateral eyes  . Back Pain    Seen by Dr. Farris Has with [facility] - scheduled for MRI next week     HPI: [name] is a 50 y.o. male who presents to [facility] [place] today for a Male Wellness Visit. He also has the concerns and/or needs as listed above in the chief complaint. These will be addressed in addition to the Health Maintenance Visit.   Wellness Visit: annual visit with health maintenance review and exam    Health maintenance: Continues to eat well.  Has lost almost 70 pounds since his gastric palpable surgery last year.  Sleep is good.  Mood is good.  Main issues are his chronic low back pain.  Deferring colonoscopy until next year.  Immunizations are up-to-date.  Body mass index is 29.93 kg/m. Wt Readings from Last 3 Encounters:  03/05/20 208 lb 9.6 oz (94.6 kg)  02/13/20 218 lb 4.8 oz (99 kg)  01/15/20 222 lb 9.6 oz (101 kg)     Chronic disease management visit and/or acute problem visit:  Diabetes follow up: His diabetic control is reported as Unchanged.  He is taking Metformin 1000 twice daily. He denies exertional CP or SOB or symptomatic hypoglycemia. He denies foot sores but admits to bilateral mild nonpainful paresthesias.  He is due an eye exam.  Hyperlipidemia on statin, tolerating well and due for recheck.  History of CVA on aspirin therapy.  Goal LDL less than 70.  No neuro symptoms.  Sleep apnea on CPAP machine: Follow-up with pulmonology today to see if he can get scheduled for another sleep study.  He may be able to come off of this if sleep apnea is resolved since his significant weight loss.  Chronic pain syndrome on chronic narcotics: Formally managed by PCP, then saw Dr. Jordan Likes at pain management.  However he did not feel that was very helpful.  He reports he takes Percocet 2-3 times weekly  usually 2-3 doses on the days when he needed.  He requests refill from our office.  He currently is seeing orthopedics for further evaluation.  Status post gastric bypass: Taking vitamin supplements.  Due for recheck.  Has history of vitamin D and B12 deficiencies.  Tries eat a balanced diet.  PTSD chronic depression on chronic Effexor: He reports this is stable.  His mood is mostly good.  He has occasional "bad days".  No change in symptoms.  No shortness Effexor medications.  He still feels it is helpful.   Immunization History  Administered Date(s) Administered  . Influenza Split 09/13/2011  . Influenza Whole 11/18/2008  . Influenza,inj,Quad PF,6+ Mos 11/15/2012, 11/12/2013, 09/26/2016, 11/21/2017, 11/19/2018, 12/11/2019  . PFIZER(Purple Top)SARS-COV-2 Vaccination 04/14/2019, 05/12/2019, 01/05/2020  . Pneumococcal Polysaccharide-23 12/22/2010  . Td 03/16/2009  . Tdap 02/17/2011  . Zoster Recombinat (Shingrix) 07/05/2019, 01/10/2020    Diabetes Related Lab Review: Lab Results  Component Value Date   HGBA1C 5.4 03/05/2020   HGBA1C 7.2 (H) 11/29/2019   HGBA1C 6.7 (A) 05/21/2019    Lab Results  Component Value Date   MICROALBUR <0.7 02/27/2019   Lab Results  Component Value Date   CREATININE 1.23 12/03/2019   BUN 16 11/29/2019   NA 139 11/29/2019   K 4.2 11/29/2019   CL 103 11/29/2019   CO2 25 11/29/2019   Lab Results  Component Value Date  CHOL 130 02/25/2019   CHOL 262 (H) 12/05/2017   CHOL 249 (H) 02/07/2014   Lab Results  Component Value Date   HDL 47.60 02/25/2019   HDL 52.00 12/05/2017   HDL 38.90 (L) 02/07/2014   Lab Results  Component Value Date   LDLCALC 69 02/25/2019   LDLCALC 182 (H) 12/05/2017   LDLCALC 182 (H) 02/07/2014   Lab Results  Component Value Date   TRIG 68.0 02/25/2019   TRIG 141.0 12/05/2017   TRIG 139.0 02/07/2014   Lab Results  Component Value Date   CHOLHDL 3 02/25/2019   CHOLHDL 5 12/05/2017   CHOLHDL 6 02/07/2014   Lab  Results  Component Value Date   LDLDIRECT 148.1 11/04/2010   LDLDIRECT 207.7 02/08/2007   The 10-year ASCVD risk score Denman George DC Jr., et al., 2013) is: 12.2%   Values used to calculate the score:     Age: 49 years     Sex: Male     Is Non-Hispanic African American: Yes     Diabetic: Yes     Tobacco smoker: Yes     Systolic Blood Pressure: 110 mmHg     Is BP treated: No     HDL Cholesterol: 60 mg/dL     Total Cholesterol: 202 mg/dL  BP Readings from Last 3 Encounters:  03/05/20 110/78  02/19/20 114/67  12/11/19 122/88   Wt Readings from Last 3 Encounters:  03/05/20 208 lb 9.6 oz (94.6 kg)  02/13/20 218 lb 4.8 oz (99 kg)  01/15/20 222 lb 9.6 oz (101 kg)    Health Maintenance  Topic Date Due  . Hepatitis C Screening  Never done  . URINE MICROALBUMIN  02/27/2020  . COLONOSCOPY (Pts 45-64yrs Insurance coverage will need to be confirmed)  06/08/2020 (Originally 06/12/2014)  . OPHTHALMOLOGY EXAM  03/27/2020  . HEMOGLOBIN A1C  09/02/2020  . TETANUS/TDAP  02/16/2021  . FOOT EXAM  03/05/2021  . INFLUENZA VACCINE  Completed  . PNEUMOCOCCAL POLYSACCHARIDE VACCINE AGE 41-64 HIGH RISK  Completed  . COVID-19 Vaccine  Completed  . HIV Screening  Completed     Patient Active Problem List   Diagnosis Date Noted  . Class 2 severe obesity due to excess calories with serious comorbidity and body mass index (BMI) of 38.0 to 38.9 in adult (HCC) 03/29/2018  . Chronic post-traumatic stress disorder (PTSD) after military combat 06/03/2017  . Major depression, recurrent, chronic (HCC) 01/01/2017  . Chronic pain syndrome 09/03/2016  . Type 2 diabetes mellitus with diabetic polyneuropathy, without long-term current use of insulin (HCC) 12/22/2010  . Hyperlipidemia associated with type 2 diabetes mellitus (HCC), on statin 08/23/2006  . Obstructive sleep apnea 08/23/2006  . Chronic low back pain 08/23/2006  . Seizure disorder (HCC) 08/23/2006  . History of CVA (cerebrovascular accident)  08/23/2006  . Insomnia 07/15/2015  . BPH (benign prostatic hyperplasia) 11/15/2012  . Gout 08/23/2006  . Vitamin B 12 deficiency 12/09/2017  . Allergic rhinitis 08/23/2006  . 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 12/03/2019  . Inflammatory spondylopathy of lumbar region (HCC) 09/02/2017  . Chronic right shoulder pain 06/03/2017  . Trigger thumb, right thumb 05/29/2015  . Carpal tunnel syndrome on right 02/06/2015  . Cervical spondylosis without myelopathy 02/06/2015  . Lumbar facet arthropathy 08/02/2011  . Lumbar spondylosis 08/02/2011  . Chronic pain of both knees 12/08/2008   Health Maintenance  Topic Date Due  . Hepatitis C Screening  Never done  . URINE MICROALBUMIN  02/27/2020  .  COLONOSCOPY (Pts 45-3328yrs Insurance coverage will need to be confirmed)  06/08/2020 (Originally 06/12/2014)  . OPHTHALMOLOGY EXAM  03/27/2020  . HEMOGLOBIN A1C  09/02/2020  . TETANUS/TDAP  02/16/2021  . FOOT EXAM  03/05/2021  . INFLUENZA VACCINE  Completed  . PNEUMOCOCCAL POLYSACCHARIDE VACCINE AGE 66-64 HIGH RISK  Completed  . COVID-19 Vaccine  Completed  . HIV Screening  Completed   Immunization History  Administered Date(s) Administered  . Influenza Split 09/13/2011  . Influenza Whole 11/18/2008  . Influenza,inj,Quad PF,6+ Mos 11/15/2012, 11/12/2013, 09/26/2016, 11/21/2017, 11/19/2018, 12/11/2019  . PFIZER(Purple Top)SARS-COV-2 Vaccination 04/14/2019, 05/12/2019, 01/05/2020  . Pneumococcal Polysaccharide-23 12/22/2010  . Td 03/16/2009  . Tdap 02/17/2011  . Zoster Recombinat (Shingrix) 07/05/2019, 01/10/2020   We updated and reviewed the patient's past history in detail and it is documented below. Allergies: Patient is allergic to atorvastatin, tizanidine, and cymbalta [duloxetine hcl]. Past Medical History  has a past medical history of Adrenal abnormality (HCC), Allergy, Anxiety, Arthritis, Atypical chest pain, BPH (benign prostatic hypertrophy), Complication of anesthesia, Depression, DM  (diabetes mellitus) (HCC), GERD (gastroesophageal reflux disease), Gout, H/O: CVA (cardiovascular accident), Hyperlipidemia, Knee pain, bilateral, LBP (low back pain), OSA (obstructive sleep apnea), Plantar fasciitis, and Seizure disorder (HCC). Past Surgical History Patient  has a past surgical history that includes Trigger finger release (Right, 05/14/2015); Neck surgery; Shoulder surgery (Right); Heel spur surgery (Left); Endoscopic plantar fasciotomy; Carpal tunnel release; and Gastric Roux-En-Y (N/A, 12/03/2019). Social History Patient  reports that he quit smoking about 29 years ago. His smoking use included cigarettes. He quit after 4.00 years of use. He has never used smokeless tobacco. He reports that he does not drink alcohol and does not use drugs. Family History family history includes Allergies in his daughter; Cancer in his maternal uncle; Diabetes in his father; Heart attack in his maternal grandfather; Hypertension in his father. Review of Systems: Constitutional: negative for fever or malaise Ophthalmic: negative for photophobia, double vision or loss of vision Cardiovascular: negative for chest pain, dyspnea on exertion, or new LE swelling Respiratory: negative for SOB or persistent cough Gastrointestinal: negative for abdominal pain, change in bowel habits or melena Genitourinary: negative for dysuria or gross hematuria Musculoskeletal: negative for new gait disturbance or muscular weakness Integumentary: negative for new or persistent rashes Neurological: negative for TIA or stroke symptoms Psychiatric: negative for SI or delusions Allergic/Immunologic: negative for hives  Patient Care Team    Relationship Specialty Notifications Start End  Willow OraAndy, Abass Misener L, MD PCP - General Family Medicine  02/27/19   [facility]  04/08/19   Ardell IsaacsSpivey, David, MD Consulting Physician Pain Medicine  05/21/19    Objective  Vitals: BP 110/78   Pulse 89   Temp 98.2 F  (36.8 C) (Temporal)   Resp 16   Wt 208 lb 9.6 oz (94.6 kg)   SpO2 98%   BMI 29.93 kg/m  General:  Well developed, well nourished, no acute distress  Psych:  Alert and orientedx3,normal mood and affect HEENT:  Normocephalic, atraumatic, non-icteric sclera, PERRL, supple neck without adenopathy, mass or thyromegaly Cardiovascular:  Normal S1, S2, RRR without gallop, rub or murmur, nondisplaced PMI, +2 distal pulses in bilateral upper and lower extremities. Respiratory:  Good breath sounds bilaterally, CTAB with normal respiratory effort Gastrointestinal: normal bowel sounds, soft, non-tender, no noted masses. No HSM MSK: no deformities, contusions. Joints are without erythema or swelling. Spine and CVA region are nontender Skin:  Warm, no rashes or suspicious lesions noted Neurologic:  Mental status is normal. CN 2-11 are normal. Gross motor and sensory exams are normal. Stable gait. No tremor GU: No inguinal hernias or adenopathy are appreciated bilaterally  Diabetic Foot Exam: Appearance - no lesions, ulcers or calluses Skin - no sigificant pallor or erythema Monofilament testing - sensitive bilaterally in following locations:  Right - Great toe, medial, central, lateral ball and posterior foot intact  Left - Great toe, medial, central, lateral ball and posterior foot intact Pulses - +2 distally bilaterally  Assessment  1. Annual physical exam   2. Type 2 diabetes mellitus with diabetic polyneuropathy, without long-term current use of insulin (HCC)   3. Hyperlipidemia associated with type 2 diabetes mellitus (HCC), on statin   4. Vitamin B 12 deficiency   5. Major depression, recurrent, chronic (HCC)   6. Status post gastric bypass for obesity   7. Chronic post-traumatic stress disorder (PTSD) after military combat   8. History of CVA (cerebrovascular accident)   9. Obstructive sleep apnea   10. Chronic pain syndrome   11. Chronic narcotic use   12. Need for hepatitis C  screening test      Plan  Male Wellness Visit:  Age appropriate Health Maintenance and Prevention measures were discussed with patient. Included topics are cancer screening recommendations, ways to keep healthy (see AVS) including dietary and exercise recommendations, regular eye and dental care, use of seat belts, and avoidance of moderate alcohol use and tobacco use.  Will refer for colonoscopy later this year.  Deferred due to recent gastric bypass  BMI: discussed patient's BMI and encouraged positive lifestyle modifications to help get to or maintain a target BMI.  HM needs and immunizations were addressed and ordered. See below for orders. See HM and immunization section for updates.  Up-to-date  Routine labs and screening tests ordered including cmp, cbc and lipids where appropriate.  Discussed recommendations regarding Vit D and calcium supplementation (see AVS)  Chronic disease f/u and/or acute problem visit: (deemed necessary to be done in addition to the wellness visit):  Type 2 diabetes: A1c now in the normal range on Metformin.  Continue to eat well and lose weight.  We will stop Metformin and recheck in 3 months.  He may have reversed this medical problem.  Has mild peripheral neuropathy.  No indication for medications at this time.  Will monitor.  He is to schedule an eye exam.  Hyperlipidemia: Recheck lipids on statin.  Crestor 20 mg nightly.  Adjust dose up if needed.  LDL goal less than 70.  No new neurologic symptoms.  Continue secondary prevention strategies.  Mood disorder: Stable on high-dose Effexor.  Takes 150 mg twice daily  Chronic pain, chronic back pain chronic narcotic use: Counseling and education done.  Reaffirmed that I do not manage chronic pain with narcotics in this office.  Recommend follow-up with pain management or orthopedics.  Refill 1 Percocet prescription for him to use until decides what strategy she would like to pursue.  I reviewed orthopedic  notes.  Status post gastric bypass with excellent results.  Continue healthy diet.  Check for vitamin deficiencies and iron deficiency.  Follow up: No follow-ups on file.   Commons side effects, risks, benefits, and alternatives for medications and treatment plan prescribed today were discussed, and the patient expressed understanding of the given instructions. Patient is instructed to call or message via MyChart if he/she has any questions or concerns regarding our treatment plan. No barriers to understanding were identified. We discussed Red  Flag symptoms and signs in detail. Patient expressed understanding regarding what to do in case of urgent or emergency type symptoms.   Medication list was reconciled, printed and provided to the patient in AVS. Patient instructions and summary information was reviewed with the patient as documented in the AVS. This note was prepared with assistance of Dragon voice recognition software. Occasional wrong-word or sound-a-like substitutions may have occurred due to the inherent limitations of voice recognition software  This visit occurred during the SARS-CoV-2 public health emergency.  Safety protocols were in place, including screening questions prior to the visit, additional usage of staff PPE, and extensive cleaning of exam room while observing appropriate contact time as indicated for disinfecting solutions.   Orders Placed This Encounter  Procedures  . CBC with Differential/Platelet  . Comprehensive metabolic panel  . Lipid panel  . TSH  . Vitamin B12  . VITAMIN D 25 Hydroxy (Vit-D Deficiency, Fractures)  . Microalbumin / creatinine urine ratio  . Hepatitis C antibody  . POCT glycosylated hemoglobin (Hb A1C)   No orders of the defined types were placed in this encounter.

## 2020-03-05 NOTE — Assessment & Plan Note (Signed)
Well-controlled AHI Patient reports adherence Received supplies from the [place] Previous sleep study in 2006 showed severe obstructive sleep apnea with an AHI of 78  Patient had gastric bypass in 2021.  Has lost around 70 pounds.  Patient is interested in repeat sleep study.  Plan: Repeat sleep study Continue CPAP at this time Follow-up in 1 year Continue work with primary care on management of BMI

## 2020-03-05 NOTE — Progress Notes (Signed)
@[name]  ID: , male    DOB: [birth year], 50 y.o.   MRN: [id number]  Chief Complaint  [name] presents with  . Follow-up    Pt states he has been doing okay since last visit and denies any complaints. Pt is still wearing CPAP at night.    Referring provider: [id number], MD  HPI:  50 year old male former smoker [facility] for severe obstructive sleep apnea  PMH: Hyperlipidemia, gout, type 2 diabetes Smoker/ Smoking History: Former smoker Maintenance: None Pt of: Dr. 44   03/05/2020  - Visit   50 year old male former smoker [facility] for severe obstructive sleep apnea.  [name] presented to her office today as a 1 year follow-up.  CPAP compliance report shows excellent compliance and well-controlled AHI.  [name] receives his supplies from the 44  [name] had gastric bypass last year and has lost 70 pounds.  He has been taken off of his Metformin.  He feels that he is at close to his goal weight.  He is interested in obtaining a repeat sleep study in comparison to his 2006 study.   No flowsheet data found.  Tests:   FENO:  No results found for: NITRICOXIDE  PFT: No flowsheet data found.  WALK:  No flowsheet data found.  Imaging: No results found.  Lab Results:  CBC    Component Value Date/Time   WBC 7.1 12/04/2019 0427   RBC 5.81 12/04/2019 0427   HGB 14.6 12/04/2019 0427   HCT 45.0 12/04/2019 0427   PLT 273 12/04/2019 0427   MCV 77.5 (L) 12/04/2019 0427   MCH 25.1 (L) 12/04/2019 0427   MCHC 32.4 12/04/2019 0427   RDW 16.6 (H) 12/04/2019 0427   LYMPHSABS 1.2 12/04/2019 0427   MONOABS 0.7 12/04/2019 0427   EOSABS 0.0 12/04/2019 0427   BASOSABS 0.0 12/04/2019 0427    BMET    Component Value Date/Time   NA 139 11/29/2019 1343   K 4.2 11/29/2019 1343   CL 103 11/29/2019 1343   CO2 25 11/29/2019 1343   GLUCOSE 96 11/29/2019 1343   BUN 16 11/29/2019 1343   CREATININE 1.23 12/03/2019 1424   CREATININE 1.04 07/22/2011  1640   CALCIUM 9.4 11/29/2019 1343   GFRNONAA >60 12/03/2019 1424   GFRAA >60 10/28/2015 0811    BNP No results found for: BNP  ProBNP No results found for: PROBNP  Specialty Problems      Pulmonary Problems   Allergic rhinitis    Generally controlled with Zyrtec.      Obstructive sleep apnea    NPSG 2006:  AHI 78/hr. Auto optimized to 13-14 cm.  Autoset 01/2011 to 13 cm.         Allergies  Allergen Reactions  . Atorvastatin Swelling  . Tizanidine Hives  . Cymbalta [Duloxetine Hcl]     Stomach upset, difficulty urinating, decreased libido.    Immunization History  Administered Date(s) Administered  . Influenza Split 09/13/2011  . Influenza Whole 11/18/2008  . Influenza,inj,Quad PF,6+ Mos 11/15/2012, 11/12/2013, 09/26/2016, 11/21/2017, 11/19/2018, 12/11/2019  . PFIZER(Purple Top)SARS-COV-2 Vaccination 04/14/2019, 05/12/2019, 01/05/2020  . Pneumococcal Polysaccharide-23 12/22/2010  . Td 03/16/2009  . Tdap 02/17/2011  . Zoster Recombinat (Shingrix) 07/05/2019, 01/10/2020    Past Medical History:  Diagnosis Date  . Adrenal abnormality (HCC)   . Allergy   . Anxiety   . Arthritis   . Atypical chest pain   . BPH (benign prostatic hypertrophy)   . Complication of anesthesia  Hard time waking up after neck surgery  . Depression   . DM (diabetes mellitus) (HCC)   . GERD (gastroesophageal reflux disease)   . Gout   . H/O: CVA (cardiovascular accident)   . Hyperlipidemia   . Knee pain, bilateral   . LBP (low back pain)   . OSA (obstructive sleep apnea)   . Plantar fasciitis   . Seizure disorder (HCC)     Tobacco History: Social History   Tobacco Use  Smoking Status Former Smoker  . Years: 4.00  . Types: Cigarettes  . Quit date: 12/22/1990  . Years since quitting: 29.2  Smokeless Tobacco Never Used  Tobacco Comment   1-2 packs a week   Counseling given: Not Answered Comment: 1-2 packs a week   Continue to not smoke  Outpatient Encounter  Medications as of 03/05/2020  Medication Sig  . acetaminophen (TYLENOL) 500 MG tablet Take 1,000 mg by mouth every 6 (six) hours as needed for mild pain or moderate pain.   . cetirizine (ZYRTEC) 10 MG tablet Take 10 mg by mouth daily as needed for allergies.   . Cholecalciferol (VITAMIN D3) 125 MCG (5000 UT) CAPS Take 5,000 Units by mouth daily.  . cyclobenzaprine (FLEXERIL) 10 MG tablet Take 10 mg by mouth 3 (three) times daily.  . fluticasone (FLONASE) 50 MCG/ACT nasal spray Place 2 sprays into both nostrils daily as needed for allergies or rhinitis.  . methocarbamol (ROBAXIN) 500 MG tablet Take 1 tablet (500 mg total) by mouth 2 (two) times daily.  Marland Kitchen oxyCODONE-acetaminophen (PERCOCET) 10-325 MG tablet Take 1 tablet by mouth every 6 (six) hours as needed for pain.  . rosuvastatin (CRESTOR) 20 MG tablet TAKE 1 TABLET BY MOUTH EVERY DAY  . sildenafil (VIAGRA) 100 MG tablet Take 100 mg by mouth as needed for erectile dysfunction (Take one-half tablet by mouth as instructed (take 1 hour prior to sexual activity *do not exceed 1 dose per 24 hour period*)).  Marland Kitchen venlafaxine XR (EFFEXOR-XR) 150 MG 24 hr capsule Take 150 mg by mouth 2 (two) times daily.  . vitamin B-12 (CYANOCOBALAMIN) 100 MCG tablet Take 1 tablet (100 mcg total) by mouth daily.   No facility-administered encounter medications on file as of 03/05/2020.     Review of Systems  Review of Systems  Constitutional: Negative for activity change, chills, fatigue, fever and unexpected weight change.  HENT: Negative for postnasal drip, rhinorrhea, sinus pressure, sinus pain and sore throat.   Eyes: Negative.   Respiratory: Negative for cough, shortness of breath and wheezing.   Cardiovascular: Negative for chest pain and palpitations.  Gastrointestinal: Negative for constipation, diarrhea, nausea and vomiting.  Endocrine: Negative.   Genitourinary: Negative.   Musculoskeletal: Negative.   Skin: Negative.   Neurological: Negative for  dizziness and headaches.  Psychiatric/Behavioral: Negative.  Negative for dysphoric mood. The [name] is not nervous/anxious.   All other systems reviewed and are negative.    Physical Exam  BP 120/64 (BP Location: Left Arm, Cuff Size: Normal)   Pulse 94   Temp 97.6 F (36.4 C) (Other (Comment)) Comment (Src): wrist  Ht 5\' 10"  (1.778 m)   Wt 208 lb (94.3 kg)   SpO2 99% Comment: RA  BMI 29.84 kg/m   Wt Readings from Last 5 Encounters:  03/05/20 208 lb (94.3 kg)  03/05/20 208 lb 9.6 oz (94.6 kg)  02/13/20 218 lb 4.8 oz (99 kg)  01/15/20 222 lb 9.6 oz (101 kg)  12/18/19 243 lb 4.8 oz (110.4  kg)    BMI Readings from Last 5 Encounters:  03/05/20 29.84 kg/m  03/05/20 29.93 kg/m  02/13/20 31.32 kg/m  01/15/20 31.94 kg/m  12/18/19 34.91 kg/m     Physical Exam Vitals and nursing note reviewed.  Constitutional:      General: He is not in acute distress.    Appearance: Normal appearance. He is obese.  HENT:     Head: Normocephalic and atraumatic.     Right Ear: Hearing and external ear normal.     Left Ear: Hearing and external ear normal.     Nose: Nose normal. No mucosal edema or rhinorrhea.     Right Turbinates: Not enlarged.     Left Turbinates: Not enlarged.     Mouth/Throat:     Mouth: Mucous membranes are dry.     Pharynx: Oropharynx is clear. No oropharyngeal exudate.  Eyes:     Pupils: Pupils are equal, round, and reactive to light.  Cardiovascular:     Rate and Rhythm: Normal rate and regular rhythm.     Pulses: Normal pulses.     Heart sounds: Normal heart sounds. No murmur heard.   Pulmonary:     Effort: Pulmonary effort is normal.     Breath sounds: Normal breath sounds. No decreased breath sounds, wheezing or rales.  Musculoskeletal:     Cervical back: Normal range of motion.     Right lower leg: No edema.     Left lower leg: No edema.  Lymphadenopathy:     Cervical: No cervical adenopathy.  Skin:    General: Skin is warm and dry.      Capillary Refill: Capillary refill takes less than 2 seconds.     Findings: No erythema or rash.  Neurological:     General: No focal deficit present.     Mental Status: He is alert and oriented to person, place, and time.     Motor: No weakness.     Coordination: Coordination normal.     Gait: Gait is intact. Gait normal.  Psychiatric:        Mood and Affect: Mood normal.        Behavior: Behavior normal. Behavior is cooperative.        Thought Content: Thought content normal.        Judgment: Judgment normal.       Assessment & Plan:   Obstructive sleep apnea Well-controlled AHI [name] reports adherence Received supplies from the [place] Previous sleep study in 2006 showed severe obstructive sleep apnea with an AHI of 78  [name] had gastric bypass in 2021.  Has lost around 70 pounds.  [name] is interested in repeat sleep study.  Plan: Repeat sleep study Continue CPAP at this time Follow-up in 1 year Continue work with primary care on management of BMI    Return in about 1 year (around 03/05/2021), or if symptoms worsen or fail to improve, for Follow up with Dr. Vassie Loll.   Coral Ceo, NP 03/05/2020   This appointment required 26 minutes of [name] care (this includes precharting, chart review, review of results, face-to-face care, etc.).

## 2020-03-06 ENCOUNTER — Other Ambulatory Visit: Payer: Self-pay

## 2020-03-06 DIAGNOSIS — E782 Mixed hyperlipidemia: Secondary | ICD-10-CM

## 2020-03-06 LAB — HEPATITIS C ANTIBODY
Hepatitis C Ab: NONREACTIVE
SIGNAL TO CUT-OFF: 0.1 (ref <1.00)

## 2020-03-06 MED ORDER — ROSUVASTATIN CALCIUM 20 MG PO TABS: 20.0000 mg | ORAL_TABLET | Freq: Every day | ORAL | 2 refills | Status: DC

## 2020-03-13 ENCOUNTER — Ambulatory Visit: Payer: BC Managed Care – PPO | Admitting: Family Medicine

## 2020-03-13 ENCOUNTER — Other Ambulatory Visit: Payer: Self-pay | Admitting: Neurosurgery

## 2020-03-17 DIAGNOSIS — M4316 Spondylolisthesis, lumbar region: Secondary | ICD-10-CM | POA: Diagnosis not present

## 2020-03-20 ENCOUNTER — Other Ambulatory Visit: Payer: BC Managed Care – PPO

## 2020-03-31 ENCOUNTER — Other Ambulatory Visit: Payer: Self-pay

## 2020-03-31 ENCOUNTER — Encounter (HOSPITAL_COMMUNITY): Payer: Self-pay

## 2020-03-31 ENCOUNTER — Other Ambulatory Visit (HOSPITAL_COMMUNITY): Payer: BC Managed Care – PPO

## 2020-03-31 ENCOUNTER — Encounter (HOSPITAL_COMMUNITY)
Admission: RE | Admit: 2020-03-31 | Discharge: 2020-03-31 | Disposition: A | Discharge status: Home / Self Care | Payer: No Typology Code available for payment source | Source: Ambulatory Visit | Attending: Neurosurgery | Admitting: Neurosurgery

## 2020-03-31 DIAGNOSIS — Z01812 Encounter for preprocedural laboratory examination: Secondary | ICD-10-CM | POA: Insufficient documentation

## 2020-03-31 HISTORY — DX: Post-traumatic stress disorder, unspecified: F43.10

## 2020-03-31 LAB — BASIC METABOLIC PANEL
Anion gap: 10 (ref 5–15)
BUN: 11 mg/dL (ref 6–20)
CO2: 28 mmol/L (ref 22–32)
Calcium: 9.5 mg/dL (ref 8.9–10.3)
Chloride: 102 mmol/L (ref 98–111)
Creatinine, Ser: 0.96 mg/dL (ref 0.61–1.24)
GFR, Estimated: >60 mL/min (ref >60)
Glucose, Bld: 101 mg/dL — ABNORMAL HIGH (ref 70–99)
Potassium: 3.6 mmol/L (ref 3.5–5.1)
Sodium: 140 mmol/L (ref 135–145)

## 2020-03-31 LAB — CBC WITH DIFFERENTIAL/PLATELET
Abs Immature Granulocytes: 0 10*3/uL (ref 0.00–0.07)
Basophils Absolute: 0 10*3/uL (ref 0.0–0.1)
Basophils Relative: 1 %
Eosinophils Absolute: 0.1 10*3/uL (ref 0.0–0.5)
Eosinophils Relative: 1 %
HCT: 45.2 % (ref 39.0–52.0)
Hemoglobin: 14.7 g/dL (ref 13.0–17.0)
Immature Granulocytes: 0 %
Lymphocytes Relative: 36 %
Lymphs Abs: 2 10*3/uL (ref 0.7–4.0)
MCH: 26.3 pg (ref 26.0–34.0)
MCHC: 32.5 g/dL (ref 30.0–36.0)
MCV: 80.7 fL (ref 80.0–100.0)
Monocytes Absolute: 0.4 10*3/uL (ref 0.1–1.0)
Monocytes Relative: 8 %
Neutro Abs: 3 10*3/uL (ref 1.7–7.7)
Neutrophils Relative %: 54 %
Platelets: 246 10*3/uL (ref 150–400)
RBC: 5.6 MIL/uL (ref 4.22–5.81)
RDW: 17 % — ABNORMAL HIGH (ref 11.5–15.5)
WBC: 5.5 10*3/uL (ref 4.0–10.5)
nRBC: 0 % (ref 0.0–0.2)

## 2020-03-31 LAB — TYPE AND SCREEN
ABO/RH(D): O POS
Antibody Screen: NEGATIVE

## 2020-03-31 LAB — SURGICAL PCR SCREEN
MRSA, PCR: NEGATIVE
Staphylococcus aureus: NEGATIVE

## 2020-03-31 LAB — GLUCOSE, CAPILLARY: Glucose-Capillary: 134 mg/dL — ABNORMAL HIGH (ref 70–99)

## 2020-03-31 NOTE — Progress Notes (Signed)
   How to Manage Your Diabetes Before and After Surgery  Why is it important to control my blood sugar before and after surgery? . Improving blood sugar levels before and after surgery helps healing and can limit problems. . A way of improving blood sugar control is eating a healthy diet by: o  Eating less sugar and carbohydrates o  Increasing activity/exercise o  Talking with your doctor about reaching your blood sugar goals . High blood sugars (greater than 180 mg/dL) can raise your risk of infections and slow your recovery, so you will need to focus on controlling your diabetes during the weeks before surgery. . Make sure that the doctor who takes care of your diabetes knows about your planned surgery including the date and location.  . How do I manage my blood sugar before surg o Check your blood sugar the morning of your surgery when you wake up and every 2 hours until you get to the Short Stay unit. . If your blood sugar is less than 70 mg/dL, you will need to treat for low blood sugar: o Treat a low blood sugar (less than 70 mg/dL) with  cup of clear juice (cranberry or apple), 4 glucose tablets, OR glucose gel. Recheck blood sugar in 15 minutes after treatment (to make sure it is greater than 70 mg/dL). If your blood sugar is not greater than 70 mg/dL on recheck, call [phone] o  for further instructions. . Report your blood sugar to the short stay nurse when you get to Short Stay.  . If you are admitted to the hospital after surgery: o Your blood sugar will be checked by the staff and you will probably be given insulin after surgery (instead of oral diabetes medicines) to make sure you have good blood sugar levels. o The goal for blood sugar control after surgery is 80-180 mg/dL.

## 2020-03-31 NOTE — Pre-Procedure Instructions (Signed)
[name]  03/31/2020    Your procedure is scheduled on Friday, March 4.  Report to [facility], Main Entrance or Entrance "A" at 6:00 AM.                Your surgery or procedure is scheduled to begin at 8:00 AM   Call this number if you have problems the morning of surgery: [phone]  This is the number for the Pre- Surgical Desk.                For any other questions, please call [phone], Monday - Friday 8 AM - 4 PM.   Remember:  Do not eat or drink after midnight Thursday , March 3    Take these medicines the morning of surgery with A SIP OF WATER: rosuvastatin (CRESTOR)  venlafaxine XR (EFFEXOR-XR)   Take if needed: acetaminophen (TYLENOL) or oxyCODONE-acetaminophen (PERCOCET)  fluticasone (FLONASE) cyclobenzaprine (FLEXERIL) or methocarbamol (ROBAXIN) cetirizine (ZYRTEC)  STOP taking Aspirin, Aspirin Products (Goody Powder, Excedrin Migraine), Ibuprofen (Advil), Naproxen (Aleve), Vitamins and Herbal Products (ie Fish Oil).    Special instructions:    [facility]- Preparing For Surgery  Before surgery, you can play an important role. Because skin is not sterile, your skin needs to be as free of germs as possible. You can reduce the number of germs on your skin by washing with CHG (chlorahexidine gluconate) Soap before surgery.  CHG is an antiseptic cleaner which kills germs and bonds with the skin to continue killing germs even after washing.    Oral Hygiene is also important to reduce your risk of infection.  Remember - BRUSH YOUR TEETH THE MORNING OF SURGERY WITH YOUR REGULAR TOOTHPASTE  Please do not use if you have an allergy to CHG or antibacterial soaps. If your skin becomes reddened/irritated stop using the CHG.  Do not shave (including legs and underarms) for at least 48 hours prior to first CHG shower. It is OK to shave your face.  Please follow these instructions carefully.   1. Shower the NIGHT BEFORE SURGERY and the MORNING OF  SURGERY with CHG.   2. If you chose to wash your hair, wash your hair first as usual with your normal shampoo.  3. After you shampoo, wash your face and private area with the soap you use at home, then rinse your hair and body thoroughly to remove the shampoo and soap.  4. Use CHG as you would any other liquid soap. You can apply CHG directly to the skin and wash gently with a scrungie or a clean washcloth.   5. Apply the CHG Soap to your body ONLY FROM THE NECK DOWN.  Do not use on open wounds or open sores. Avoid contact with your eyes, ears, mouth and genitals (private parts).   6. Wash thoroughly, paying special attention to the area where your surgery will be performed.  7. Thoroughly rinse your body with warm water from the neck down.  8. DO NOT shower/wash with your normal soap after using and rinsing off the CHG Soap.  9. Pat yourself dry with a CLEAN TOWEL.  10. Wear CLEAN PAJAMAS to bed the night before surgery, wear comfortable clothes the morning of surgery  11. Place CLEAN SHEETS on your bed the night of your first shower and DO NOT SLEEP WITH PETS.  Day of Surgery: Shower as instructed above. Do not apply any deodorants/lotions, powders or colognes.  Please wear clean clothes to the  hospital/surgery center.   Remember to brush your teeth WITH YOUR REGULAR TOOTHPASTE.  Do not wear jewelry, make-up or nail polish.  Do not shave 48 hours prior to surgery.  Men may shave face and neck.  Do not bring valuables to the hospital.  [facility] is not responsible for any belongings or valuables.  Contacts, dentures or bridgework may not be worn into surgery.  Leave your suitcase in the car.  After surgery it may be brought to your room.  For patients admitted to the hospital, discharge time will be determined by your treatment team.  Patients discharged the day of surgery will not be allowed to drive home.   Please read over the fact sheets that you were  given.

## 2020-03-31 NOTE — Progress Notes (Addendum)
PCP - Dr. Asencion Partridge  [facility] [place]  Cardiologist - saw Dr. Allyson Sabal in 2017- for atypical chest pain- a stress test was done, it was normal.  Dr. Allyson Sabal thought it was Musculoskeletal related. No follow up needed.  Chest x-ray - NA  EKG - 05/27/19  Stress Test - 2017  ECHO - no  Cardiac Cath - no Pulmonologist - Dr Virgel Manifold- for CPAP  Sleep Study - yes- 2005. Patient will have a repeat one, has lost 70 lbs. CPAP - yes  LABS-CBCwith diff, BMP, T?S, PCR  ASA-no  ERAS-no  HA1C-5.2- on 2/3/222 Fasting Blood Sugar - 80-110 Checks Blood Sugar __0___ times a day Checks occassionally. Since losing 70 lbs- metformin has been discontinued.  Anesthesia-  Pt denies having chest pain, sob, or fever at this time. All instructions explained to the pt, with a verbal understanding of the material. Pt agrees to go over the instructions while at home for a better understanding. Pt also instructed to self quarantine after being tested for COVID-19. The opportunity to ask questions was provided.

## 2020-04-01 ENCOUNTER — Other Ambulatory Visit (HOSPITAL_COMMUNITY)
Admission: RE | Admit: 2020-04-01 | Discharge: 2020-04-01 | Disposition: A | Discharge status: Home / Self Care | Payer: No Typology Code available for payment source | Source: Ambulatory Visit | Attending: Neurosurgery | Admitting: Neurosurgery

## 2020-04-01 DIAGNOSIS — Z20822 Contact with and (suspected) exposure to covid-19: Secondary | ICD-10-CM | POA: Insufficient documentation

## 2020-04-01 DIAGNOSIS — Z01812 Encounter for preprocedural laboratory examination: Secondary | ICD-10-CM | POA: Diagnosis not present

## 2020-04-01 LAB — SARS CORONAVIRUS 2 (TAT 6-24 HRS): SARS Coronavirus 2: NEGATIVE

## 2020-04-03 ENCOUNTER — Other Ambulatory Visit: Payer: Self-pay

## 2020-04-03 ENCOUNTER — Encounter (HOSPITAL_COMMUNITY)
Admission: RE | Disposition: A | Discharge status: Home / Self Care | Payer: Self-pay | Source: Ambulatory Visit | Attending: Neurosurgery

## 2020-04-03 ENCOUNTER — Ambulatory Visit (HOSPITAL_COMMUNITY): Payer: No Typology Code available for payment source

## 2020-04-03 ENCOUNTER — Encounter (HOSPITAL_COMMUNITY): Payer: Self-pay | Admitting: Neurosurgery

## 2020-04-03 ENCOUNTER — Ambulatory Visit (HOSPITAL_COMMUNITY): Payer: No Typology Code available for payment source | Admitting: Certified Registered Nurse Anesthetist

## 2020-04-03 ENCOUNTER — Observation Stay (HOSPITAL_COMMUNITY)
Admission: RE | Admit: 2020-04-03 | Discharge: 2020-04-04 | Disposition: A | Discharge status: Home / Self Care | Payer: No Typology Code available for payment source | Source: Ambulatory Visit | Attending: Neurosurgery | Admitting: Neurosurgery

## 2020-04-03 DIAGNOSIS — Z79899 Other long term (current) drug therapy: Secondary | ICD-10-CM | POA: Diagnosis not present

## 2020-04-03 DIAGNOSIS — M549 Dorsalgia, unspecified: Secondary | ICD-10-CM | POA: Diagnosis present

## 2020-04-03 DIAGNOSIS — E119 Type 2 diabetes mellitus without complications: Secondary | ICD-10-CM | POA: Diagnosis not present

## 2020-04-03 DIAGNOSIS — M4727 Other spondylosis with radiculopathy, lumbosacral region: Secondary | ICD-10-CM | POA: Diagnosis not present

## 2020-04-03 DIAGNOSIS — Z87891 Personal history of nicotine dependence: Secondary | ICD-10-CM | POA: Diagnosis not present

## 2020-04-03 DIAGNOSIS — Z419 Encounter for procedure for purposes other than remedying health state, unspecified: Secondary | ICD-10-CM

## 2020-04-03 DIAGNOSIS — M4310 Spondylolisthesis, site unspecified: Secondary | ICD-10-CM

## 2020-04-03 LAB — GLUCOSE, CAPILLARY
Glucose-Capillary: 95 mg/dL (ref 70–99)
Glucose-Capillary: 101 mg/dL — ABNORMAL HIGH (ref 70–99)

## 2020-04-03 SURGERY — POSTERIOR LUMBAR FUSION 1 LEVEL
Anesthesia: General | Site: Spine Lumbar

## 2020-04-03 MED ORDER — MIDAZOLAM HCL 2 MG/2ML IJ SOLN
INTRAMUSCULAR | Status: DC | PRN
  Administered 2020-04-03: 2 mg via INTRAVENOUS

## 2020-04-03 MED ORDER — FENTANYL CITRATE (PF) 100 MCG/2ML IJ SOLN
INTRAMUSCULAR | Status: AC
  Administered 2020-04-03: 50 ug via INTRAVENOUS
  Filled 2020-04-03: qty 2

## 2020-04-03 MED ORDER — SUGAMMADEX SODIUM 200 MG/2ML IV SOLN
INTRAVENOUS | Status: DC | PRN
  Administered 2020-04-03: 200 mg via INTRAVENOUS

## 2020-04-03 MED ORDER — SUCCINYLCHOLINE CHLORIDE 200 MG/10ML IV SOSY
PREFILLED_SYRINGE | INTRAVENOUS | Status: AC
  Filled 2020-04-03: qty 10

## 2020-04-03 MED ORDER — PROPOFOL 10 MG/ML IV BOLUS
INTRAVENOUS | Status: DC | PRN
  Administered 2020-04-03: 30 mg via INTRAVENOUS
  Administered 2020-04-03: 20 mg via INTRAVENOUS
  Administered 2020-04-03: 140 mg via INTRAVENOUS
  Administered 2020-04-03: 30 mg via INTRAVENOUS

## 2020-04-03 MED ORDER — MIDAZOLAM HCL 2 MG/2ML IJ SOLN
INTRAMUSCULAR | Status: AC
  Filled 2020-04-03: qty 2

## 2020-04-03 MED ORDER — PHENOL 1.4 % MT LIQD: 1.0000 | OROMUCOSAL | Status: DC | PRN

## 2020-04-03 MED ORDER — SODIUM CHLORIDE 0.9% FLUSH: 3.0000 mL | INTRAVENOUS | Status: DC | PRN

## 2020-04-03 MED ORDER — OXYCODONE HCL 5 MG/5ML PO SOLN: 5.0000 mg | Freq: Once | ORAL | Status: AC | PRN

## 2020-04-03 MED ORDER — THROMBIN 20000 UNITS EX SOLR
CUTANEOUS | Status: AC
  Filled 2020-04-03: qty 20000

## 2020-04-03 MED ORDER — LIDOCAINE 2% (20 MG/ML) 5 ML SYRINGE
INTRAMUSCULAR | Status: DC | PRN
  Administered 2020-04-03: 80 mg via INTRAVENOUS

## 2020-04-03 MED ORDER — ADULT MULTIVITAMIN W/MINERALS CH
1.0000 | ORAL_TABLET | Freq: Every day | ORAL | Status: DC
  Administered 2020-04-04: 1 via ORAL
  Filled 2020-04-03: qty 1

## 2020-04-03 MED ORDER — FENTANYL CITRATE (PF) 250 MCG/5ML IJ SOLN
INTRAMUSCULAR | Status: AC
  Filled 2020-04-03: qty 5

## 2020-04-03 MED ORDER — VANCOMYCIN HCL 1000 MG IV SOLR
INTRAVENOUS | Status: DC | PRN
  Administered 2020-04-03: 1000 mg via TOPICAL

## 2020-04-03 MED ORDER — CEFAZOLIN SODIUM-DEXTROSE 1-4 GM/50ML-% IV SOLN
1.0000 g | Freq: Three times a day (TID) | INTRAVENOUS | Status: AC
  Administered 2020-04-03 (×2): 1 g via INTRAVENOUS
  Filled 2020-04-03 (×2): qty 50

## 2020-04-03 MED ORDER — THROMBIN 20000 UNITS EX SOLR: CUTANEOUS | Status: DC | PRN

## 2020-04-03 MED ORDER — ACETAMINOPHEN 160 MG/5ML PO SOLN: 1000.0000 mg | Freq: Once | ORAL | Status: DC | PRN

## 2020-04-03 MED ORDER — OXYCODONE HCL 5 MG PO TABS
ORAL_TABLET | ORAL | Status: AC
  Filled 2020-04-03: qty 1

## 2020-04-03 MED ORDER — FLEET ENEMA 7-19 GM/118ML RE ENEM: 1.0000 | ENEMA | Freq: Once | RECTAL | Status: DC | PRN

## 2020-04-03 MED ORDER — HYDROXYZINE HCL 50 MG/ML IM SOLN: 50.0000 mg | Freq: Four times a day (QID) | INTRAMUSCULAR | Status: DC | PRN

## 2020-04-03 MED ORDER — DEXAMETHASONE SODIUM PHOSPHATE 10 MG/ML IJ SOLN
INTRAMUSCULAR | Status: DC | PRN
  Administered 2020-04-03: 10 mg via INTRAVENOUS

## 2020-04-03 MED ORDER — ROSUVASTATIN CALCIUM 20 MG PO TABS
20.0000 mg | ORAL_TABLET | Freq: Every day | ORAL | Status: DC
  Administered 2020-04-03 – 2020-04-04 (×2): 20 mg via ORAL
  Filled 2020-04-03 (×2): qty 1

## 2020-04-03 MED ORDER — OXYCODONE HCL 5 MG PO TABS
10.0000 mg | ORAL_TABLET | ORAL | Status: DC | PRN
  Administered 2020-04-03 – 2020-04-04 (×6): 10 mg via ORAL
  Filled 2020-04-03 (×6): qty 2

## 2020-04-03 MED ORDER — CHLORHEXIDINE GLUCONATE CLOTH 2 % EX PADS: 6.0000 | MEDICATED_PAD | Freq: Once | CUTANEOUS | Status: DC

## 2020-04-03 MED ORDER — OXYCODONE HCL 5 MG PO TABS
5.0000 mg | ORAL_TABLET | Freq: Once | ORAL | Status: AC | PRN
  Administered 2020-04-03: 5 mg via ORAL

## 2020-04-03 MED ORDER — ONDANSETRON HCL 4 MG/2ML IJ SOLN
INTRAMUSCULAR | Status: AC
  Filled 2020-04-03: qty 2

## 2020-04-03 MED ORDER — FLUTICASONE PROPIONATE 50 MCG/ACT NA SUSP
2.0000 | Freq: Every day | NASAL | Status: DC | PRN
  Filled 2020-04-03: qty 16

## 2020-04-03 MED ORDER — VENLAFAXINE HCL ER 75 MG PO CP24
150.0000 mg | ORAL_CAPSULE | Freq: Two times a day (BID) | ORAL | Status: DC
  Administered 2020-04-03 – 2020-04-04 (×2): 150 mg via ORAL
  Filled 2020-04-03 (×2): qty 2

## 2020-04-03 MED ORDER — 0.9 % SODIUM CHLORIDE (POUR BTL) OPTIME
TOPICAL | Status: DC | PRN
  Administered 2020-04-03: 1000 mL

## 2020-04-03 MED ORDER — POLYETHYLENE GLYCOL 3350 17 G PO PACK: 17.0000 g | PACK | Freq: Every day | ORAL | Status: DC | PRN

## 2020-04-03 MED ORDER — FENTANYL CITRATE (PF) 250 MCG/5ML IJ SOLN
INTRAMUSCULAR | Status: DC | PRN
  Administered 2020-04-03: 50 ug via INTRAVENOUS
  Administered 2020-04-03: 150 ug via INTRAVENOUS
  Administered 2020-04-03 (×3): 50 ug via INTRAVENOUS
  Administered 2020-04-03: 100 ug via INTRAVENOUS
  Administered 2020-04-03: 50 ug via INTRAVENOUS

## 2020-04-03 MED ORDER — DIAZEPAM 5 MG PO TABS
ORAL_TABLET | ORAL | Status: AC
  Filled 2020-04-03: qty 2

## 2020-04-03 MED ORDER — ORAL CARE MOUTH RINSE: 15.0000 mL | Freq: Once | OROMUCOSAL | Status: AC

## 2020-04-03 MED ORDER — VANCOMYCIN HCL 1000 MG IV SOLR
INTRAVENOUS | Status: AC
  Filled 2020-04-03: qty 1000

## 2020-04-03 MED ORDER — SODIUM CHLORIDE 0.9 % IV SOLN
250.0000 mL | INTRAVENOUS | Status: DC
  Administered 2020-04-03: 250 mL via INTRAVENOUS

## 2020-04-03 MED ORDER — DIAZEPAM 5 MG PO TABS
5.0000 mg | ORAL_TABLET | Freq: Four times a day (QID) | ORAL | Status: DC | PRN
Start: 2020-04-03 — End: 2020-04-04
  Administered 2020-04-03: 10 mg via ORAL
  Administered 2020-04-03: 5 mg via ORAL
  Administered 2020-04-04: 10 mg via ORAL
  Filled 2020-04-03: qty 1
  Filled 2020-04-03: qty 2

## 2020-04-03 MED ORDER — ACETAMINOPHEN 650 MG RE SUPP: 650.0000 mg | RECTAL | Status: DC | PRN

## 2020-04-03 MED ORDER — LORATADINE 10 MG PO TABS: 10.0000 mg | ORAL_TABLET | Freq: Every day | ORAL | Status: DC

## 2020-04-03 MED ORDER — PROPOFOL 500 MG/50ML IV EMUL
INTRAVENOUS | Status: DC | PRN
  Administered 2020-04-03: 150 ug/kg/min via INTRAVENOUS

## 2020-04-03 MED ORDER — PROPOFOL 10 MG/ML IV BOLUS
INTRAVENOUS | Status: AC
  Filled 2020-04-03: qty 20

## 2020-04-03 MED ORDER — BUPIVACAINE HCL (PF) 0.25 % IJ SOLN
INTRAMUSCULAR | Status: AC
  Filled 2020-04-03: qty 30

## 2020-04-03 MED ORDER — MENTHOL 3 MG MT LOZG
1.0000 | LOZENGE | OROMUCOSAL | Status: DC | PRN
Start: 2020-04-03 — End: 2020-04-04

## 2020-04-03 MED ORDER — ONDANSETRON HCL 4 MG/2ML IJ SOLN
INTRAMUSCULAR | Status: DC | PRN
  Administered 2020-04-03: 4 mg via INTRAVENOUS

## 2020-04-03 MED ORDER — HYDROMORPHONE HCL 1 MG/ML IJ SOLN
1.0000 mg | INTRAMUSCULAR | Status: DC | PRN
  Administered 2020-04-03 – 2020-04-04 (×2): 1 mg via INTRAVENOUS
  Filled 2020-04-03 (×2): qty 1

## 2020-04-03 MED ORDER — FENTANYL CITRATE (PF) 100 MCG/2ML IJ SOLN
25.0000 ug | INTRAMUSCULAR | Status: DC | PRN
  Administered 2020-04-03 (×3): 50 ug via INTRAVENOUS

## 2020-04-03 MED ORDER — CEFAZOLIN SODIUM-DEXTROSE 2-4 GM/100ML-% IV SOLN
2.0000 g | INTRAVENOUS | Status: AC
  Administered 2020-04-03: 2 g via INTRAVENOUS
  Filled 2020-04-03: qty 100

## 2020-04-03 MED ORDER — HYDROCODONE-ACETAMINOPHEN 10-325 MG PO TABS: 1.0000 | ORAL_TABLET | ORAL | Status: DC | PRN

## 2020-04-03 MED ORDER — ONDANSETRON HCL 4 MG PO TABS: 4.0000 mg | ORAL_TABLET | Freq: Four times a day (QID) | ORAL | Status: DC | PRN

## 2020-04-03 MED ORDER — LIDOCAINE 2% (20 MG/ML) 5 ML SYRINGE
INTRAMUSCULAR | Status: AC
  Filled 2020-04-03: qty 5

## 2020-04-03 MED ORDER — BISACODYL 10 MG RE SUPP
10.0000 mg | Freq: Every day | RECTAL | Status: DC | PRN
Start: 2020-04-03 — End: 2020-04-04

## 2020-04-03 MED ORDER — ROCURONIUM BROMIDE 10 MG/ML (PF) SYRINGE
PREFILLED_SYRINGE | INTRAVENOUS | Status: AC
  Filled 2020-04-03: qty 10

## 2020-04-03 MED ORDER — ACETAMINOPHEN 10 MG/ML IV SOLN
INTRAVENOUS | Status: AC
  Filled 2020-04-03: qty 100

## 2020-04-03 MED ORDER — LACTATED RINGERS IV SOLN: INTRAVENOUS | Status: DC

## 2020-04-03 MED ORDER — SODIUM CHLORIDE 0.9% FLUSH
3.0000 mL | Freq: Two times a day (BID) | INTRAVENOUS | Status: DC
  Administered 2020-04-03 (×2): 3 mL via INTRAVENOUS

## 2020-04-03 MED ORDER — BUPIVACAINE HCL (PF) 0.25 % IJ SOLN
INTRAMUSCULAR | Status: DC | PRN
  Administered 2020-04-03: 20 mL

## 2020-04-03 MED ORDER — ROCURONIUM BROMIDE 10 MG/ML (PF) SYRINGE
PREFILLED_SYRINGE | INTRAVENOUS | Status: DC | PRN
  Administered 2020-04-03: 30 mg via INTRAVENOUS
  Administered 2020-04-03: 70 mg via INTRAVENOUS
  Administered 2020-04-03: 100 mg via INTRAVENOUS

## 2020-04-03 MED ORDER — CALCIUM CITRATE-VITAMIN D 500-400 MG-UNIT PO CHEW: 3.0000 | CHEWABLE_TABLET | Freq: Every day | ORAL | Status: DC

## 2020-04-03 MED ORDER — FENTANYL CITRATE (PF) 100 MCG/2ML IJ SOLN
INTRAMUSCULAR | Status: AC
  Filled 2020-04-03: qty 2

## 2020-04-03 MED ORDER — PHENYLEPHRINE HCL-NACL 10-0.9 MG/250ML-% IV SOLN
INTRAVENOUS | Status: DC | PRN
  Administered 2020-04-03: 20 ug/min via INTRAVENOUS

## 2020-04-03 MED ORDER — ONDANSETRON HCL 4 MG/2ML IJ SOLN: 4.0000 mg | Freq: Four times a day (QID) | INTRAMUSCULAR | Status: DC | PRN

## 2020-04-03 MED ORDER — DEXAMETHASONE SODIUM PHOSPHATE 10 MG/ML IJ SOLN: 10.0000 mg | Freq: Once | INTRAMUSCULAR | Status: DC

## 2020-04-03 MED ORDER — EPHEDRINE 5 MG/ML INJ
INTRAVENOUS | Status: AC
  Filled 2020-04-03: qty 10

## 2020-04-03 MED ORDER — ACETAMINOPHEN 325 MG PO TABS
650.0000 mg | ORAL_TABLET | ORAL | Status: DC | PRN
Start: 2020-04-03 — End: 2020-04-04
  Administered 2020-04-03 – 2020-04-04 (×3): 650 mg via ORAL
  Filled 2020-04-03 (×3): qty 2

## 2020-04-03 MED ORDER — CHLORHEXIDINE GLUCONATE 0.12 % MT SOLN
15.0000 mL | Freq: Once | OROMUCOSAL | Status: AC
  Administered 2020-04-03: 15 mL via OROMUCOSAL
  Filled 2020-04-03: qty 15

## 2020-04-03 MED ORDER — ACETAMINOPHEN 500 MG PO TABS: 1000.0000 mg | ORAL_TABLET | Freq: Once | ORAL | Status: DC | PRN

## 2020-04-03 MED ORDER — ACETAMINOPHEN 10 MG/ML IV SOLN: 1000.0000 mg | Freq: Once | INTRAVENOUS | Status: DC | PRN

## 2020-04-03 MED ORDER — PHENYLEPHRINE 40 MCG/ML (10ML) SYRINGE FOR IV PUSH (FOR BLOOD PRESSURE SUPPORT)
PREFILLED_SYRINGE | INTRAVENOUS | Status: AC
  Filled 2020-04-03: qty 10

## 2020-04-03 MED ORDER — PROPOFOL 1000 MG/100ML IV EMUL
INTRAVENOUS | Status: AC
  Filled 2020-04-03: qty 200

## 2020-04-03 MED ORDER — DEXAMETHASONE SODIUM PHOSPHATE 10 MG/ML IJ SOLN
INTRAMUSCULAR | Status: AC
  Filled 2020-04-03: qty 1

## 2020-04-03 MED ORDER — ACETAMINOPHEN 10 MG/ML IV SOLN
INTRAVENOUS | Status: DC | PRN
  Administered 2020-04-03: 1000 mg via INTRAVENOUS

## 2020-04-03 SURGICAL SUPPLY — 60 items
BAG DECANTER FOR FLEXI CONT (MISCELLANEOUS) ×2 IMPLANT
BENZOIN TINCTURE PRP APPL 2/3 (GAUZE/BANDAGES/DRESSINGS) ×2 IMPLANT
BLADE CLIPPER SURG (BLADE) IMPLANT
BONE GRAFTON DBF INJECT 6CC (Bone Implant) ×2 IMPLANT
BUR CUTTER 7.0 ROUND (BURR) IMPLANT
BUR MATCHSTICK NEURO 3.0 LAGG (BURR) ×2 IMPLANT
CANISTER SUCT 3000ML PPV (MISCELLANEOUS) ×2 IMPLANT
CAP LCK SPNE (Orthopedic Implant) ×4 IMPLANT
CAP LOCK SPINE RADIUS (Orthopedic Implant) ×4 IMPLANT
CAP LOCKING (Orthopedic Implant) ×8 IMPLANT
CARTRIDGE OIL MAESTRO DRILL (MISCELLANEOUS) ×1 IMPLANT
CNTNR URN SCR LID CUP LEK RST (MISCELLANEOUS) ×1 IMPLANT
CONT SPEC 4OZ STRL OR WHT (MISCELLANEOUS) ×2
COVER BACK TABLE 60X90IN (DRAPES) ×2 IMPLANT
COVER WAND RF STERILE (DRAPES) ×2 IMPLANT
DECANTER SPIKE VIAL GLASS SM (MISCELLANEOUS) ×2 IMPLANT
DERMABOND ADVANCED (GAUZE/BANDAGES/DRESSINGS) ×1
DERMABOND ADVANCED .7 DNX12 (GAUZE/BANDAGES/DRESSINGS) ×1 IMPLANT
DIFFUSER DRILL AIR PNEUMATIC (MISCELLANEOUS) ×2 IMPLANT
DRAPE C-ARM 42X72 X-RAY (DRAPES) ×4 IMPLANT
DRAPE HALF SHEET 40X57 (DRAPES) IMPLANT
DRAPE LAPAROTOMY 100X72X124 (DRAPES) ×2 IMPLANT
DRAPE SURG 17X23 STRL (DRAPES) ×8 IMPLANT
DRSG OPSITE POSTOP 4X6 (GAUZE/BANDAGES/DRESSINGS) ×2 IMPLANT
DURAPREP 26ML APPLICATOR (WOUND CARE) ×2 IMPLANT
ELECT REM PT RETURN 9FT ADLT (ELECTROSURGICAL) ×2
ELECTRODE REM PT RTRN 9FT ADLT (ELECTROSURGICAL) ×1 IMPLANT
EVACUATOR 1/8 PVC DRAIN (DRAIN) IMPLANT
GAUZE 4X4 16PLY RFD (DISPOSABLE) IMPLANT
GAUZE SPONGE 4X4 12PLY STRL (GAUZE/BANDAGES/DRESSINGS) IMPLANT
GLOVE BIO SURGEON STRL SZ 6.5 (GLOVE) ×2 IMPLANT
GLOVE ECLIPSE 9.0 STRL (GLOVE) ×4 IMPLANT
GLOVE EXAM NITRILE XL STR (GLOVE) IMPLANT
GLOVE SURG UNDER POLY LF SZ6.5 (GLOVE) ×2 IMPLANT
GOWN STRL REUS W/ TWL LRG LVL3 (GOWN DISPOSABLE) IMPLANT
GOWN STRL REUS W/ TWL XL LVL3 (GOWN DISPOSABLE) ×2 IMPLANT
GOWN STRL REUS W/TWL 2XL LVL3 (GOWN DISPOSABLE) IMPLANT
GOWN STRL REUS W/TWL LRG LVL3 (GOWN DISPOSABLE)
GOWN STRL REUS W/TWL XL LVL3 (GOWN DISPOSABLE) ×4
KIT BASIN OR (CUSTOM PROCEDURE TRAY) ×2 IMPLANT
KIT TURNOVER KIT B (KITS) ×2 IMPLANT
MILL MEDIUM DISP (BLADE) ×2 IMPLANT
NEEDLE HYPO 22GX1.5 SAFETY (NEEDLE) ×2 IMPLANT
NS IRRIG 1000ML POUR BTL (IV SOLUTION) ×2 IMPLANT
OIL CARTRIDGE MAESTRO DRILL (MISCELLANEOUS) ×2
PACK LAMINECTOMY NEURO (CUSTOM PROCEDURE TRAY) ×2 IMPLANT
ROD RADIUS 40MM (Neuro Prosthesis/Implant) ×4 IMPLANT
ROD SPNL 40X5.5XNS TI RDS (Neuro Prosthesis/Implant) ×2 IMPLANT
SCREW 5.75X45MM (Screw) ×8 IMPLANT
SPACER PL CATALYFT LONG 11 (Spacer) ×4 IMPLANT
SPONGE SURGIFOAM ABS GEL 100 (HEMOSTASIS) ×2 IMPLANT
STRIP CLOSURE SKIN 1/2X4 (GAUZE/BANDAGES/DRESSINGS) ×2 IMPLANT
SUT VIC AB 0 CT1 18XCR BRD8 (SUTURE) ×2 IMPLANT
SUT VIC AB 0 CT1 8-18 (SUTURE) ×4
SUT VIC AB 2-0 CT1 18 (SUTURE) ×2 IMPLANT
SUT VIC AB 3-0 SH 8-18 (SUTURE) ×4 IMPLANT
TOWEL GREEN STERILE (TOWEL DISPOSABLE) ×2 IMPLANT
TOWEL GREEN STERILE FF (TOWEL DISPOSABLE) ×2 IMPLANT
TRAY FOLEY MTR SLVR 16FR STAT (SET/KITS/TRAYS/PACK) ×2 IMPLANT
WATER STERILE IRR 1000ML POUR (IV SOLUTION) ×2 IMPLANT

## 2020-04-03 NOTE — Evaluation (Signed)
Occupational Therapy Evaluation and Discharge Patient Details Name: [name] MRN: [id number] DOB: 1970-12-22 Today's Date: 04/03/2020    History of Present Illness Bilateral L5-S1 decompressive laminotomies and foraminotomies and L5-S1 posterior lumbar interbody fusion   Clinical Impression   This 50 yo male admitted and underwent above presents to acute OT with all education completed with pt and wife, we will D/C from acute OT.    Follow Up Recommendations  No OT follow up;Supervision - Intermittent    Equipment Recommendations  3 in 1 bedside commode;Tub/shower bench;Other (comment) (RW)       Precautions / Restrictions Precautions Precautions: Back Precaution Booklet Issued: Yes (comment) Required Braces or Orthoses: Spinal Brace Spinal Brace: Lumbar corset;Applied in sitting position Restrictions Weight Bearing Restrictions: No      Mobility Bed Mobility Overal bed mobility: Needs Assistance Bed Mobility: Rolling;Sidelying to Sit Rolling: Min assist (A at trunk) Sidelying to sit: Min assist (A at trunk)            Transfers Overall transfer level: Needs assistance Equipment used: Rolling walker (2 wheeled) Transfers: Sit to/from Stand Sit to Stand: Mod assist         General transfer comment: sit>stand from raised bed (which he has at home)    Balance Overall balance assessment: Needs assistance Sitting-balance support: No upper extremity supported;Feet supported Sitting balance-Leahy Scale: Fair     Standing balance support: Bilateral upper extremity supported Standing balance-Leahy Scale: Poor Standing balance comment: Reliant on RW                           ADL either performed or assessed with clinical judgement   ADL                                         General ADL Comments: Educated on use of two cups for brushing teeth to avoid bending, using wet wipes for back peri care to do limited twisting,  sequence of dressing, how to use a tub bench, how to don/doff the brace (pt and wife did)     Vision Patient Visual Report: No change from baseline              Pertinent Vitals/Pain Pain Assessment: 0-10 Pain Score: 7  Pain Location: incisional and left leg Pain Descriptors / Indicators: Dull;Shooting;Aching Pain Intervention(s): Limited activity within patient's tolerance;Monitored during session     Hand Dominance Right   Extremity/Trunk Assessment Upper Extremity Assessment Upper Extremity Assessment: Overall WFL for tasks assessed           Communication Communication Communication: No difficulties   Cognition Arousal/Alertness: Awake/alert Behavior During Therapy: WFL for tasks assessed/performed Overall Cognitive Status: Within Functional Limits for tasks assessed                                                Home Living Family/patient expects to be discharged to:: Private residence Living Arrangements: Spouse/significant other Available Help at Discharge: Family;Available 24 hours/day Type of Home: Apartment Home Access: Level entry     Home Layout: One level     Bathroom Shower/Tub: Tub/shower unit;Curtain   Firefighter: Standard     Home Equipment: None  Prior Functioning/Environment Level of Independence: Independent                 OT Problem List: Decreased range of motion;Impaired balance (sitting and/or standing);Decreased knowledge of precautions;Pain         OT Goals(Current goals can be found in the care plan section) Acute Rehab OT Goals Patient Stated Goal: pain to be better and hopeful for home tomorrow  OT Frequency:                AM-PAC OT "6 Clicks" Daily Activity     Outcome Measure Help from another person eating meals?: None Help from another person taking care of personal grooming?: A Little Help from another person toileting, which includes using toliet, bedpan, or urinal?:  A Lot Help from another person bathing (including washing, rinsing, drying)?: A Lot Help from another person to put on and taking off regular upper body clothing?: A Little Help from another person to put on and taking off regular lower body clothing?: A Lot 6 Click Score: 16   End of Session Equipment Utilized During Treatment: Gait belt;Rolling walker;Back brace Nurse Communication:  (pt needs a RW, 3n1, tub bench)  Activity Tolerance: Patient tolerated treatment well Patient left: in bed;with call bell/phone within reach;with family/visitor present  OT Visit Diagnosis: Unsteadiness on feet (R26.81);Other abnormalities of gait and mobility (R26.89);Pain Pain - Right/Left: Left Pain - part of body: Leg (and back)                Time: 1165-7903 OT Time Calculation (min): 45 min Charges:  OT General Charges $OT Visit: 1 Visit OT Evaluation $OT Eval Moderate Complexity: 1 Mod OT Treatments $Self Care/Home Management : 23-37 mins  Ignacia Palma, OTR/L Acute [employer] Pager [phone] Office [phone]     Evette Georges 04/03/2020, 3:35 PM

## 2020-04-03 NOTE — Anesthesia Procedure Notes (Signed)
Procedure Name: Intubation Date/Time: 04/03/2020 8:23 AM Performed by: Darletta Moll, CRNA Pre-anesthesia Checklist: Patient identified, Emergency Drugs available, Suction available and Patient being monitored Patient Re-evaluated:Patient Re-evaluated prior to induction Oxygen Delivery Method: Circle system utilized Preoxygenation: Pre-oxygenation with 100% oxygen Induction Type: IV induction Ventilation: Mask ventilation without difficulty and Oral airway inserted - appropriate to patient size Laryngoscope Size: Mac and 4 Grade View: Grade I Tube type: Oral Tube size: 7.5 mm Number of attempts: 1 Airway Equipment and Method: Stylet and Oral airway Placement Confirmation: ETT inserted through vocal cords under direct vision,  positive ETCO2 and breath sounds checked- equal and bilateral Secured at: 23 cm Tube secured with: Tape Dental Injury: Teeth and Oropharynx as per pre-operative assessment  Comments: Performed by Mervin Kung EMT student

## 2020-04-03 NOTE — Brief Op Note (Signed)
04/03/2020  10:51 AM  PATIENT:  [name]  50 y.o. male  PRE-OPERATIVE DIAGNOSIS:  Spondylolisthesis  POST-OPERATIVE DIAGNOSIS:  Spondylolisthesis  PROCEDURE:  Procedure(s): LUMBAR FIVE-SACRAL ONE POSTERIOR LUMBAR INTERBODY FUSION (N/A)  SURGEON:  Surgeon(s) and Role:    * Julio Sicks, MD - Primary  PHYSICIAN ASSISTANT:   ASSISTANTSMarland Mcalpine   ANESTHESIA:   general  EBL:  100 mL   BLOOD ADMINISTERED:none  DRAINS: none   LOCAL MEDICATIONS USED:  MARCAINE     SPECIMEN:  No Specimen  DISPOSITION OF SPECIMEN:  N/A  COUNTS:  YES  TOURNIQUET:  * No tourniquets in log *  DICTATION: .Dragon Dictation  PLAN OF CARE: Admit for overnight observation  PATIENT DISPOSITION:  PACU - hemodynamically stable.   Delay start of Pharmacological VTE agent (>24hrs) due to surgical blood loss or risk of bleeding: yes

## 2020-04-03 NOTE — Anesthesia Preprocedure Evaluation (Signed)
Anesthesia Evaluation    History of Anesthesia Complications (+) PROLONGED EMERGENCE and history of anesthetic complications  Airway Mallampati: I  TM Distance: >3 FB Neck ROM: Full    Dental  (+) Dental Advisory Given, Teeth Intact   Pulmonary neg shortness of breath, sleep apnea and Continuous Positive Airway Pressure Ventilation , neg COPD, neg recent URI, former smoker,  Covid-19 Nucleic Acid Test Results Lab Results      Component                Value               Date                      SARSCOV2NAA              NEGATIVE            04/01/2020                SARSCOV2NAA              NEGATIVE            11/29/2019              breath sounds clear to auscultation       Cardiovascular negative cardio ROS   Rhythm:Regular     Neuro/Psych Seizures -, Well Controlled,  PSYCHIATRIC DISORDERS Anxiety Depression Isolated cva/seizure event in 2001 with left sided weakness, resolved, no blood thinner or antiepileptics CVA    GI/Hepatic Neg liver ROS, GERD  ,S/p gastric bypass   Endo/Other  diabetesLab Results      Component                Value               Date                      HGBA1C                   5.4                 03/05/2020             Renal/GU Lab Results      Component                Value               Date                      CREATININE               0.96                03/31/2020                Musculoskeletal  (+) Arthritis ,   Abdominal   Peds  Hematology negative hematology ROS (+) Lab Results      Component                Value               Date                      WBC                      5.5  03/31/2020                HGB                      14.7                03/31/2020                HCT                      45.2                03/31/2020                MCV                      80.7                03/31/2020                PLT                      246                  03/31/2020              Anesthesia Other Findings   Reproductive/Obstetrics                             Anesthesia Physical Anesthesia Plan  ASA: II  Anesthesia Plan: General   Post-op Pain Management:    Induction: Intravenous  PONV Risk Score and Plan: 2 and Ondansetron, Propofol infusion and Dexamethasone  Airway Management Planned: Oral ETT  Additional Equipment: None  Intra-op Plan:   Post-operative Plan: Extubation in OR  Informed Consent: I have reviewed the patients History and Physical, chart, labs and discussed the procedure including the risks, benefits and alternatives for the proposed anesthesia with the patient or authorized representative who has indicated his/her understanding and acceptance.     Dental advisory given  Plan Discussed with: CRNA and Surgeon  Anesthesia Plan Comments:         Anesthesia Quick Evaluation

## 2020-04-03 NOTE — Transfer of Care (Signed)
Immediate Anesthesia Transfer of Care Note  Patient: [name]  Procedure(s) Performed: LUMBAR FIVE-SACRAL ONE POSTERIOR LUMBAR INTERBODY FUSION (N/A Spine Lumbar)  Patient Location: PACU  Anesthesia Type:General  Level of Consciousness: drowsy and patient cooperative  Airway & Oxygen Therapy: Patient Spontanous Breathing and Patient connected to face mask oxygen  Post-op Assessment: Report given to RN and Post -op Vital signs reviewed and stable  Post vital signs: Reviewed and stable  Last Vitals:  Vitals Value Taken Time  BP 109/87 04/03/20 1114  Temp    Pulse 83 04/03/20 1116  Resp 23 04/03/20 1116  SpO2 98 % 04/03/20 1116  Vitals shown include unvalidated device data.  Last Pain:  Vitals:   04/03/20 0636  TempSrc:   PainSc: 7          Complications: No complications documented.

## 2020-04-03 NOTE — Op Note (Signed)
Date of procedure: 04/03/2020  Date of dictation: Same  Service: Neurosurgery  Preoperative diagnosis: L5-S1 degenerative spondylolisthesis with stenosis and radiculopathy  Postoperative diagnosis: Same  Procedure Name: Bilateral L5-S1 decompressive laminotomies and foraminotomies, more than would be required for simple interbody fusion alone.  L5-S1 posterior lumbar interbody fusion utilizing interbody cages, locally harvested autograft and morselized allograft.  L5-S1 posterior lateral arthrodesis utilizing nonsegmental pedicle screw fixation and local autografting.  Surgeon:Erick Murin A.Jovanne Riggenbach, M.D.  Asst. Surgeon: Doran Durand, NP  Anesthesia: General  Indication: [name] with back and bilateral lower extremity symptoms failing conservative management.  Work-up demonstrates evidence of transitional anatomy at the lumbosacral junction with significant disc degeneration with broad-based central disc herniation and degenerative retrolisthesis at L5S1 with resultant lateral recess stenosis and bilateral S1 nerve root compression.  Patient has failed conservative management presents now for lumbar decompression and fusion in hopes improving his symptoms.  Operative note: After induction of anesthesia, patient position prone onto Wilson frame appropriate padded.  Lumbar region prepped draped sterilely.  Incision made overlying L5-S1.  Dissection performed bilaterally.  Retractor placed.  Fluoroscopy used.  Levels confirmed.  Decompressive laminotomies and facetectomies were then performed using Leksell rongeurs, Kerrison Rogers the high-speed drill to remove the inferior two thirds of the lamina of L5 the entire inferior facet and pars interarticularis of L5, and the superior facet of S1 and the superior aspect of the lamina of S1.  Ligament flavum elevated and resected.  Foraminotomies completed on the course exiting L5 and S1 nerve roots bilaterally.  Bilateral discectomies then performed at  L5-S1.  The space then prepared for interbody fusion.  Soft tissue removed in the interspace.  To space distracted.  With the patient the space distracted on the right side a 11 mm [employer] expandable cage was impacted in place and expanded.  Distractor removed patient's right side.  Dissipates.  On the right side.  To space packed with morselized autograft.  A second cage packed into place and expanded.  E each cage was then filled with demineralized bone fibrils.  Pedicles of L5 and S1 were then into using surface landmarks and intraoperative fluoroscopy and superficial bone around pedicle was then removed using high-speed drill each pedicle was then probed using pedicle all each pedicle all track was then probed and found to be solidly within the bone.  Each pedicle tract was then tapped with a screw tap.  Screw drill hole was probed and found to be solidly within the bone.  5.75 mm x 45 mm radius brand screws from [employer] were placed bilaterally at L5 and S1.  Final images reveal good position of the cage and the hardware at the proper upper level with normal alignment of spine.  Wound was irrigated one final time.  Gelfoam was placed topically for hemostasis.  Transverse processes were decorticated and morselized autograft was packed posterior laterally.  Short segment titanium rod placed through the screws at L5 and S1.  Locking caps placed over the screws and locking caps and engaged with a construct under compression.  Vancomycin powder was left in the deep wound space.  Wounds closed in layers with Vicryl sutures.  Steri-Strips and sterile dressing were applied.  No apparent complications.  Patient tolerated the procedure well and he returns to the recovery room postop.

## 2020-04-03 NOTE — H&P (Signed)
[name] is an 50 y.o. male.   Chief Complaint: Back pain HPI: 50 year old male with chronic and progressive lower back pain with bilateral lower extremity radicular symptoms failing conservative management.  Work-up demonstrates evidence of transitional anatomy at the lumbosacral junction with broad-based disc herniation and significant stenosis and some associated retrolisthesis of L5 on S1.  Patient has failed conservative management as stated above.  He presents now for lumbar decompression and fusion in hopes of improving his symptoms.  Past Medical History:  Diagnosis Date  . Adrenal abnormality (HCC)   . Allergy   . Anxiety   . Arthritis   . Atypical chest pain   . BPH (benign prostatic hypertrophy)   . Complication of anesthesia    Hard time waking up after neck surgery  . Depression   . DM (diabetes mellitus) (HCC)    type II  . GERD (gastroesophageal reflux disease)    "not since Gastric Bypass"  . Gout   . H/O: CVA (cardiovascular accident)   . Hyperlipidemia   . Knee pain, bilateral   . LBP (low back pain)   . OSA (obstructive sleep apnea)   . Plantar fasciitis   . PTSD (post-traumatic stress disorder)   . Seizure disorder (HCC)   . Stroke [facility]) 2001   some memory loss- math , science     Past Surgical History:  Procedure Laterality Date  . CARPAL TUNNEL RELEASE Bilateral   . COLONOSCOPY    . ENDOSCOPIC PLANTAR FASCIOTOMY    . ESOPHAGOGASTRODUODENOSCOPY    . GASTRIC ROUX-EN-Y N/A 12/03/2019   Procedure: LAPAROSCOPIC ROUX-EN-Y GASTRIC BYPASS WITH UPPER ENDOSCOPY;  Surgeon: Luretha Murphy, MD;  Location: [facility];  Service: General;  Laterality: N/A;  . HEEL SPUR SURGERY Left   . NECK SURGERY     Titanium Plate in Vertebrae  . SHOULDER SURGERY Right   . TRIGGER FINGER RELEASE Right 05/14/2015   Procedure: RIGHT THUMB TRIGGER RELEASE ;  Surgeon: Betha Loa, MD;  Location: [facility];  Service: Orthopedics;  Laterality: Right;    Family  History  Problem Relation Age of Onset  . Diabetes Father   . Hypertension Father   . Heart attack Maternal Grandfather   . Cancer Maternal Uncle   . Allergies Daughter    Social History:  reports that he quit smoking about 29 years ago. His smoking use included cigarettes. He quit after 4.00 years of use. He has never used smokeless tobacco. He reports that he does not drink alcohol and does not use drugs.  Allergies:  Allergies  Allergen Reactions  . Atorvastatin Swelling  . Tizanidine Hives  . Cymbalta [Duloxetine Hcl]     Stomach upset, difficulty urinating, decreased libido.    Medications Prior to Admission  Medication Sig Dispense Refill  . acetaminophen (TYLENOL) 500 MG tablet Take 1,000 mg by mouth every 6 (six) hours as needed for mild pain or moderate pain.     . calcium citrate-vitamin D 500-400 MG-UNIT chewable tablet Chew 3 tablets by mouth daily.    . cyclobenzaprine (FLEXERIL) 10 MG tablet Take 10 mg by mouth 3 (three) times daily as needed for muscle spasms.    . fluticasone (FLONASE) 50 MCG/ACT nasal spray Place 2 sprays into both nostrils daily as needed for allergies or rhinitis.    . methocarbamol (ROBAXIN) 500 MG tablet Take 1 tablet (500 mg total) by mouth 2 (two) times daily. (Patient taking differently: Take 500 mg by mouth 2 (two) times  daily as needed for muscle spasms.) 20 tablet 0  . Multiple Vitamins-Minerals (MULTIVITAMIN WITH MINERALS) tablet Take 1 tablet by mouth daily.    Marland Kitchen oxyCODONE-acetaminophen (PERCOCET) 10-325 MG tablet Take 1 tablet by mouth every 6 (six) hours as needed for pain. 120 tablet 0  . rosuvastatin (CRESTOR) 20 MG tablet Take 1 tablet (20 mg total) by mouth daily. 90 tablet 2  . sildenafil (VIAGRA) 100 MG tablet Take 50 mg by mouth as needed for erectile dysfunction (Take one-half tablet by mouth as instructed (take 1 hour prior to sexual activity *do not exceed 1 dose per 24 hour period*)).    Marland Kitchen venlafaxine XR (EFFEXOR-XR) 150 MG 24  hr capsule Take 150 mg by mouth 2 (two) times daily.    . cetirizine (ZYRTEC) 10 MG tablet Take 10 mg by mouth daily as needed for allergies.       Results for orders placed or performed during the hospital encounter of 04/03/20 (from the past 48 hour(s))  Glucose, capillary     Status: None   Collection Time: 04/03/20  6:25 AM  Result Value Ref Range   Glucose-Capillary 95 70 - 99 mg/dL    Comment: Glucose reference range applies only to samples taken after fasting for at least 8 hours.   No results found.  Pertinent items noted in HPI and remainder of comprehensive ROS otherwise negative.  Blood pressure 138/85, pulse 88, temperature 98.1 F (36.7 C), temperature source Oral, resp. rate 18, height 5\' 10"  (1.778 m), weight 93 kg, SpO2 100 %.  Patient is awake and alert.  He is oriented and appropriate.  Speech is fluent.  Judgment insight are intact.  Cranial nerve function normal bilateral.  Motor examination extremities reveals 5/5 strength bilaterally.  Sensory examination nonfocal.  Deep intervals normal active.  No evidence of long track signs.  Gait is antalgic.  Posture is mildly flexed peer examination head ears eyes nose throat is unremarkable chest and abdomen are benign.  Extremities are free of major deformity. Assessment/Plan L5-S1 degenerative retrolisthesis with stenosis and radiculopathy.  Plan bilateral L5-S1 decompressive laminotomies and foraminotomies followed by posterior lumbar MRI fusion utilizing interbody cages, local harvested autograft, and augmented with posterior arthrodesis utilizing nonsegmental pedicle screw fixation local autograft.  Risks and benefits of explained.  Patient wishes to proceed.  A Pool 04/03/2020, 7:51 AM

## 2020-04-04 DIAGNOSIS — M4727 Other spondylosis with radiculopathy, lumbosacral region: Secondary | ICD-10-CM | POA: Diagnosis not present

## 2020-04-04 MED ORDER — DIAZEPAM 5 MG PO TABS: 5.0000 mg | ORAL_TABLET | Freq: Four times a day (QID) | ORAL | 0 refills | Status: DC | PRN

## 2020-04-04 MED ORDER — OXYCODONE HCL 10 MG PO TABS: 10.0000 mg | ORAL_TABLET | ORAL | 0 refills | Status: DC | PRN

## 2020-04-04 NOTE — Discharge Summary (Signed)
Physician Discharge Summary  Patient ID: [name] MRN: [id number] DOB/AGE: 1970/11/22 50 y.o.  Admit date: 04/03/2020 Discharge date: 04/04/2020  Admission Diagnoses:  Discharge Diagnoses:  Active Problems:   Degenerative spondylolisthesis   Discharged Condition: good  Hospital Course: Patient mated to the hospital where he underwent uncomplicated L5-S1 decompression fusion.  Postoperatively doing well.  Patient with expected amount of lumbar pain.  No lower extremity symptoms.  Standing ambulating and voiding without difficulty.  Ready for discharge home.  Consults:   Significant Diagnostic Studies:   Treatments:   Discharge Exam: Blood pressure 106/67, pulse 83, temperature 98.6 F (37 C), temperature source Oral, resp. rate 18, height 5\' 10"  (1.778 m), weight 93 kg, SpO2 97 %. Awake and alert.  Oriented and appropriate.  Motor and sensory function intact.  Chest and abdomen benign.  Wound clean dry and intact.  Disposition: Discharge disposition: 01-Home or Self Care        Allergies as of 04/04/2020      Reactions   Atorvastatin Swelling   Tizanidine Hives   Cymbalta [duloxetine Hcl]    Stomach upset, difficulty urinating, decreased libido.      Medication List    STOP taking these medications   cyclobenzaprine 10 MG tablet Commonly known as: FLEXERIL   methocarbamol 500 MG tablet Commonly known as: ROBAXIN   oxyCODONE-acetaminophen 10-325 MG tablet Commonly known as: PERCOCET     TAKE these medications   acetaminophen 500 MG tablet Commonly known as: TYLENOL Take 1,000 mg by mouth every 6 (six) hours as needed for mild pain or moderate pain.   calcium citrate-vitamin D 500-400 MG-UNIT chewable tablet Chew 3 tablets by mouth daily.   cetirizine 10 MG tablet Commonly known as: ZYRTEC Take 10 mg by mouth daily as needed for allergies.   diazepam 5 MG tablet Commonly known as: VALIUM Take 1-2 tablets (5-10 mg total) by mouth every 6 (six)  hours as needed for muscle spasms.   fluticasone 50 MCG/ACT nasal spray Commonly known as: FLONASE Place 2 sprays into both nostrils daily as needed for allergies or rhinitis.   multivitamin with minerals tablet Take 1 tablet by mouth daily.   Oxycodone HCl 10 MG Tabs Take 1 tablet (10 mg total) by mouth every 3 (three) hours as needed for severe pain ((score 7 to 10)).   rosuvastatin 20 MG tablet Commonly known as: CRESTOR Take 1 tablet (20 mg total) by mouth daily.   sildenafil 100 MG tablet Commonly known as: VIAGRA Take 50 mg by mouth as needed for erectile dysfunction (Take one-half tablet by mouth as instructed (take 1 hour prior to sexual activity *do not exceed 1 dose per 24 hour period*)).   venlafaxine XR 150 MG 24 hr capsule Commonly known as: EFFEXOR-XR Take 150 mg by mouth 2 (two) times daily.            Durable Medical Equipment  (From admission, onward)         Start     Ordered   04/03/20 1535  For home use only DME Tub bench  Once        04/03/20 1535   04/03/20 1303  DME Walker rolling  Once       Question:  Patient needs a walker to treat with the following condition  Answer:  Degenerative spondylolisthesis   04/03/20 1302   04/03/20 1303  DME 3 n 1  Once        04/03/20 1302  Signed: Kathaleen Maser Miya Luviano 04/04/2020, 9:39 AM

## 2020-04-04 NOTE — Plan of Care (Signed)
Patient alert and oriented, voiding adequately, MAE well with no difficulty. Incision area cdi with no s/s of infection. Patient discharged home per order. Patient and spouse stated understanding of discharge instructions given. Patient has an appointment with Dr. Pool in 2 weeks  

## 2020-04-04 NOTE — Evaluation (Signed)
Physical Therapy Evaluation Patient Details Name: [name] MRN: [id number] DOB: [birth year] Today's Date: 04/04/2020   History of Present Illness  Patient is a 50 y/o s/p bilateral L5-S1 decompressive laminotomies and foraminotomies and L5-S1 PLIF on 3/4.  Clinical Impression  PTA, patient lives with wife and reports independence. Patient requires minA for sit to stand transfer from low surface. Patient requires supervision for ambulation with RW. Patient severely limited by pain. Educated patient on back precautions with patient able to recall 3/3 at beginning of session. Patient presents with decreased activity tolerance and generalized weakness. Patient will benefit from skilled PT services during acute stay to address listed deficits. Recommend HHPT following discharge to maximize functional mobility.    Follow Up Recommendations Home health PT    Equipment Recommendations  Rolling Diarra Ceja with 5" wheels;3in1 (PT)    Recommendations for Other Services       Precautions / Restrictions Precautions Precautions: Back Precaution Booklet Issued: Yes (comment) Required Braces or Orthoses: Spinal Brace Spinal Brace: Lumbar corset;Applied in sitting position Restrictions Weight Bearing Restrictions: No      Mobility  Bed Mobility Overal bed mobility: Needs Assistance Bed Mobility: Rolling;Sidelying to Sit Rolling: Supervision         General bed mobility comments: supervision for safety and cueing for log roll technique    Transfers Overall transfer level: Needs assistance Equipment used: Rolling Morgin Halls (2 wheeled) Transfers: Sit to/from Stand Sit to Stand: Min assist         General transfer comment: from low surface. MinA for boost up. Cues for hand placemen t  Ambulation/Gait Ambulation/Gait assistance: Supervision Gait Distance (Feet): 125 Feet Assistive device: Rolling Jameil Whitmoyer (2 wheeled) Gait Pattern/deviations: Step-through pattern;Decreased stride  length;Antalgic;Narrow base of support Gait velocity: decreased Gait velocity interpretation: <1.31 ft/sec, indicative of household ambulator General Gait Details: Supervision for safety. Very slow, steady pace. Increased pain with ambulation  Stairs            Wheelchair Mobility    Modified Rankin (Stroke Patients Only)       Balance Overall balance assessment: Needs assistance Sitting-balance support: No upper extremity supported;Feet supported Sitting balance-Leahy Scale: Fair     Standing balance support: Bilateral upper extremity supported Standing balance-Leahy Scale: Poor Standing balance comment: Reliant on RW                             Pertinent Vitals/Pain Pain Assessment: Faces Faces Pain Scale: Hurts whole lot Pain Location: incisional and left leg Pain Descriptors / Indicators: Dull;Shooting;Aching Pain Intervention(s): Monitored during session    Home Living Family/patient expects to be discharged to:: Private residence Living Arrangements: Spouse/significant other Available Help at Discharge: Family;Available 24 hours/day Type of Home: Apartment Home Access: Level entry     Home Layout: One level Home Equipment: None      Prior Function Level of Independence: Independent               Hand Dominance        Extremity/Trunk Assessment   Upper Extremity Assessment Upper Extremity Assessment: Defer to OT evaluation    Lower Extremity Assessment Lower Extremity Assessment: Generalized weakness    Cervical / Trunk Assessment Cervical / Trunk Assessment: Normal  Communication      Cognition Arousal/Alertness: Awake/alert Behavior During Therapy: WFL for tasks assessed/performed Overall Cognitive Status: Within Functional Limits for tasks assessed  General Comments General comments (skin integrity, edema, etc.): Wife present and supportive    Exercises      Assessment/Plan    PT Assessment Patient needs continued PT services  PT Problem List Decreased strength;Decreased activity tolerance;Decreased balance;Decreased mobility;Pain       PT Treatment Interventions DME instruction;Gait training;Functional mobility training;Therapeutic activities;Therapeutic exercise;Balance training;Patient/family education    PT Goals (Current goals can be found in the Care Plan section)  Acute Rehab PT Goals Patient Stated Goal: to go home and pain to be better PT Goal Formulation: With patient Time For Goal Achievement: 04/18/20 Potential to Achieve Goals: Good    Frequency Min 5X/week   Barriers to discharge        Co-evaluation               AM-PAC PT "6 Clicks" Mobility  Outcome Measure Help needed turning from your back to your side while in a flat bed without using bedrails?: A Little Help needed moving from lying on your back to sitting on the side of a flat bed without using bedrails?: A Little Help needed moving to and from a bed to a chair (including a wheelchair)?: A Little Help needed standing up from a chair using your arms (e.g., wheelchair or bedside chair)?: A Little Help needed to walk in hospital room?: A Little Help needed climbing 3-5 steps with a railing? : A Lot 6 Click Score: 17    End of Session Equipment Utilized During Treatment: Gait belt;Back brace Activity Tolerance: Patient tolerated treatment well;Patient limited by pain Patient left: in chair;with call bell/phone within reach;with family/visitor present Nurse Communication: Mobility status PT Visit Diagnosis: Unsteadiness on feet (R26.81);Muscle weakness (generalized) (M62.81)    Time: 3474-2595 PT Time Calculation (min) (ACUTE ONLY): 38 min   Charges:   PT Evaluation $PT Eval Low Complexity: 1 Low PT Treatments $Therapeutic Activity: 23-37 mins        Eldene Plocher A. Dan Humphreys PT, DPT Acute Rehabilitation Services Pager [phone] Office  [phone]   Viviann Spare 04/04/2020, 9:56 AM

## 2020-04-04 NOTE — Discharge Instructions (Addendum)
Wound Care Keep incision covered and dry for two days.   Do not put any creams, lotions, or ointments on incision. Leave steri-strips on back.  They will fall off by themselves. Activity Walk each and every day, increasing distance each day. No lifting greater than 5 lbs.  Avoid excessive neck motion. No driving for 2 weeks; may ride as a passenger locally. If provided with back brace, wear when out of bed.  It is not necessary to wear brace in bed. Diet Resume your normal diet.  Return to Work Will be discussed at you follow up appointment. Call Your Doctor If Any of These Occur Redness, drainage, or swelling at the wound.  Temperature greater than 101 degrees. Severe pain not relieved by pain medication. Incision starts to come apart. Follow Up Appt Call  [phone])  for problems.  If you have any hardware placed in your spine, you will need an x-ray before your appointment.  

## 2020-04-04 NOTE — TOC Transition Note (Signed)
Transition of Care [facility]) - CM/SW Discharge Note   Patient Details  Name: [name] MRN: [id number] Date of Birth: 01-25-1971  Transition of Care [facility]) CM/SW Contact:  Lawerance Sabal, RN Phone Number: 04/04/2020, 11:31 AM   Clinical Narrative:   Sherron Monday w patient and wife at bedside, no preference for [facility] provider other than one that works with the [place]. Referral made to [facility] who will obtain auth from [place] Monday to start services. DME to be supplied by nursing staff from unit stock before DC. Patient has secondary coverage for prescriptions.     Final next level of care: Home w Home Health Services Barriers to Discharge: No Barriers Identified   Patient Goals and CMS Choice        Discharge Placement                       Discharge Plan and Services                          HH Arranged: PT,OT HH Agency: [facility] Date [facility] Agency Contacted: 04/04/20 Time HH Agency Contacted: 1131 Representative spoke with at [facility] Agency: Elnita Maxwell  Social Determinants of Health (SDOH) Interventions     Readmission Risk Interventions No flowsheet data found.

## 2020-04-06 NOTE — Anesthesia Postprocedure Evaluation (Signed)
Anesthesia Post Note  Patient: [name]  Procedure(s) Performed: LUMBAR FIVE-SACRAL ONE POSTERIOR LUMBAR INTERBODY FUSION (N/A Spine Lumbar)     Patient location during evaluation: PACU Anesthesia Type: General Level of consciousness: awake and alert Pain management: pain level controlled Vital Signs Assessment: post-procedure vital signs reviewed and stable Respiratory status: spontaneous breathing, nonlabored ventilation, respiratory function stable and patient connected to nasal cannula oxygen Cardiovascular status: blood pressure returned to baseline and stable Postop Assessment: no apparent nausea or vomiting Anesthetic complications: no   No complications documented.  Last Vitals:  Vitals:   04/04/20 0344 04/04/20 0814  BP: 111/61 106/67  Pulse: 70 83  Resp: 20 18  Temp: 36.8 C 37 C  SpO2: 97% 97%    Last Pain:  Vitals:   04/04/20 0814  TempSrc: Oral  PainSc:                  Ambrielle Kington

## 2020-04-07 MED FILL — Sodium Chloride IV Soln 0.9%: INTRAVENOUS | Qty: 1000 | Status: AC

## 2020-04-07 MED FILL — Heparin Sodium (Porcine) Inj 1000 Unit/ML: INTRAMUSCULAR | Qty: 30 | Status: AC

## 2020-04-21 ENCOUNTER — Ambulatory Visit (HOSPITAL_BASED_OUTPATIENT_CLINIC_OR_DEPARTMENT_OTHER): Payer: No Typology Code available for payment source | Admitting: Pulmonary Disease

## 2020-06-03 ENCOUNTER — Ambulatory Visit (INDEPENDENT_AMBULATORY_CARE_PROVIDER_SITE_OTHER): Payer: No Typology Code available for payment source | Admitting: Family Medicine

## 2020-06-03 ENCOUNTER — Encounter: Payer: Self-pay | Admitting: Family Medicine

## 2020-06-03 ENCOUNTER — Other Ambulatory Visit: Payer: Self-pay

## 2020-06-03 VITALS — BP 120/78 | HR 91 | Temp 98.1°F | Resp 16 | Ht 70.0 in | Wt 210.0 lb

## 2020-06-03 DIAGNOSIS — H109 Unspecified conjunctivitis: Secondary | ICD-10-CM

## 2020-06-03 DIAGNOSIS — E1169 Type 2 diabetes mellitus with other specified complication: Secondary | ICD-10-CM | POA: Diagnosis not present

## 2020-06-03 DIAGNOSIS — Z8673 Personal history of transient ischemic attack (TIA), and cerebral infarction without residual deficits: Secondary | ICD-10-CM | POA: Diagnosis not present

## 2020-06-03 DIAGNOSIS — E1142 Type 2 diabetes mellitus with diabetic polyneuropathy: Secondary | ICD-10-CM

## 2020-06-03 DIAGNOSIS — E785 Hyperlipidemia, unspecified: Secondary | ICD-10-CM

## 2020-06-03 DIAGNOSIS — Z9889 Other specified postprocedural states: Secondary | ICD-10-CM

## 2020-06-03 DIAGNOSIS — H2189 Other specified disorders of iris and ciliary body: Secondary | ICD-10-CM

## 2020-06-03 DIAGNOSIS — F339 Major depressive disorder, recurrent, unspecified: Secondary | ICD-10-CM

## 2020-06-03 LAB — COMPREHENSIVE METABOLIC PANEL
ALT: 25 U/L (ref 0–53)
AST: 18 U/L (ref 0–37)
Albumin: 4.2 g/dL (ref 3.5–5.2)
Alkaline Phosphatase: 56 U/L (ref 39–117)
BUN: 10 mg/dL (ref 6–23)
CO2: 29 mEq/L (ref 19–32)
Calcium: 9.4 mg/dL (ref 8.4–10.5)
Chloride: 105 mEq/L (ref 96–112)
Creatinine, Ser: 0.89 mg/dL (ref 0.40–1.50)
GFR: 99.59 mL/min (ref >60.00)
Glucose, Bld: 91 mg/dL (ref 70–99)
Potassium: 4 mEq/L (ref 3.5–5.1)
Sodium: 141 mEq/L (ref 135–145)
Total Bilirubin: 0.4 mg/dL (ref 0.2–1.2)
Total Protein: 6.8 g/dL (ref 6.0–8.3)

## 2020-06-03 LAB — LIPID PANEL
Cholesterol: 153 mg/dL (ref 0–200)
HDL: 62 mg/dL (ref >39.00)
LDL Cholesterol: 79 mg/dL (ref 0–99)
NonHDL: 90.93
Total CHOL/HDL Ratio: 2
Triglycerides: 61 mg/dL (ref 0.0–149.0)
VLDL: 12.2 mg/dL (ref 0.0–40.0)

## 2020-06-03 LAB — POCT GLYCOSYLATED HEMOGLOBIN (HGB A1C): Hemoglobin A1C: 5.4 % (ref 4.0–5.6)

## 2020-06-03 MED ORDER — FLUTICASONE PROPIONATE 50 MCG/ACT NA SUSP: 1.0000 | Freq: Every day | NASAL | 5 refills | Status: DC

## 2020-06-03 MED ORDER — ERYTHROMYCIN 5 MG/GM OP OINT
1.0000 | TOPICAL_OINTMENT | Freq: Three times a day (TID) | OPHTHALMIC | 0 refills | Status: DC
Start: 2020-06-03 — End: 2022-06-22

## 2020-06-03 NOTE — Progress Notes (Signed)
Subjective  CC:  Chief Complaint  Patient presents with  . Diabetes  . Eye Problem    Right eye, thinks he has an infection. Ongoing for about 4 days.    HPI: [name] is a 50 y.o. male who presents to the office today for follow up of diabetes and problems listed above in the chief complaint.   Diabetes follow up: We stopped metformin 3 months ago.  He is eating well.  Denies symptoms of hyperglycemia.  Here for recheck today to see if he is now diet controlled.  Has mild paresthesias.  No foot ulcers.  Overdue for diabetic eye exam.  No history of retinopathy  Hyperlipidemia: Restart Crestor 20 mg p.o. nightly 3 months ago.  Tolerating well.  No adverse effects.  Due for recheck today.  LDL goal less than 70 given history of diabetes and CVA  Complains of right eye redness x4 days without photophobia or pain.  Has mild crusting in the mornings.  No changes to the left eye.  No URI symptoms.  Does complain of allergies and asks for a refill of Flonase.  Redness is worsening.  Major depression: Mildly active.  Reviewed recent psychotherapist notes.  Maintained on high-dose Effexor.  Reports mood was worsened due to chronic back pain and mild isolation.  He is stable without suicidal ideation.  Expect improvement after back surgery recovery and with the positive change in the weather.  Status post lumbar decompression: Reviewed surgical admission.  Unfortunately, feels that his pain is unchanged.  Has follow-up with Dr. Dutch Quint soon.   Wt Readings from Last 3 Encounters:  06/03/20 210 lb (95.3 kg)  04/03/20 205 lb (93 kg)  03/31/20 213 lb 11.2 oz (96.9 kg)    BP Readings from Last 3 Encounters:  06/03/20 120/78  04/04/20 106/67  03/31/20 119/74    Assessment  1. Type 2 diabetes mellitus with diabetic polyneuropathy, without long-term current use of insulin (HCC)   2. Hyperlipidemia associated with type 2 diabetes mellitus (HCC), on statin   3. History of CVA  (cerebrovascular accident)   4. Major depression, recurrent, chronic (HCC)   5. Limbal injection   6. Conjunctivitis of right eye, unspecified conjunctivitis type   7. Status post lumbar spine surgery for decompression of spinal cord      Plan   Diabetes is currently very well controlled. Diet controlled.  Continue healthy diet and weight loss.  Will reassess again in 3 months.  Refer for diabetic eye exam  Conjunctivitis, erythromycin ointment 3 times daily.  However given significant injection up to medial limbus of the right eye, refer for complete eye exam to ophthalmology.  Education given.  Rule out iritis, episcleritis etc.  Hyperlipidemia history of CVA: Recheck lipids on Crestor 20 mg nightly.  Goal LDL less than 70.  Check LFTs.  Major depression: Mildly active.  Continue high-dose Effexor.  Continue follow-up with psychotherapy.  Counseling done.  Status post lumbar surgery: Has follow-up with neurosurgery.  Follow up: 3 months for diabetes recheck. Orders Placed This Encounter  Procedures  . Lipid panel  . Comprehensive metabolic panel  . Ambulatory referral to Ophthalmology  . POCT HgB A1C   Meds ordered this encounter  Medications  . erythromycin ophthalmic ointment    Sig: Place 1 application into the left eye 3 (three) times daily.    Dispense:  3.5 g    Refill:  0  . fluticasone (FLONASE) 50 MCG/ACT nasal spray  Sig: Place 1 spray into both nostrils daily.    Dispense:  18.2 mL    Refill:  5      Immunization History  Administered Date(s) Administered  . Influenza Split 09/13/2011  . Influenza Whole 11/18/2008  . Influenza,inj,Quad PF,6+ Mos 11/15/2012, 11/12/2013, 09/26/2016, 11/21/2017, 11/19/2018, 12/11/2019  . PFIZER Comirnaty(Gray Top)Covid-19 Tri-Sucrose Vaccine 05/23/2020  . PFIZER(Purple Top)SARS-COV-2 Vaccination 04/14/2019, 05/12/2019, 01/05/2020  . Pneumococcal Polysaccharide-23 12/22/2010  . Td 03/16/2009  . Tdap 02/17/2011  . Zoster  Recombinat (Shingrix) 07/05/2019, 01/10/2020    Diabetes Related Lab Review: Lab Results  Component Value Date   HGBA1C 5.4 06/03/2020   HGBA1C 5.4 03/05/2020   HGBA1C 7.2 (H) 11/29/2019    Lab Results  Component Value Date   MICROALBUR <0.7 03/05/2020   Lab Results  Component Value Date   CREATININE 0.96 03/31/2020   BUN 11 03/31/2020   NA 140 03/31/2020   K 3.6 03/31/2020   CL 102 03/31/2020   CO2 28 03/31/2020   Lab Results  Component Value Date   CHOL 226 (H) 03/05/2020   CHOL 130 02/25/2019   CHOL 262 (H) 12/05/2017   Lab Results  Component Value Date   HDL 55.80 03/05/2020   HDL 47.60 02/25/2019   HDL 52.00 12/05/2017   Lab Results  Component Value Date   LDLCALC 153 (H) 03/05/2020   LDLCALC 69 02/25/2019   LDLCALC 182 (H) 12/05/2017   Lab Results  Component Value Date   TRIG 86.0 03/05/2020   TRIG 68.0 02/25/2019   TRIG 141.0 12/05/2017   Lab Results  Component Value Date   CHOLHDL 4 03/05/2020   CHOLHDL 3 02/25/2019   CHOLHDL 5 12/05/2017   Lab Results  Component Value Date   LDLDIRECT 148.1 11/04/2010   LDLDIRECT 207.7 02/08/2007   The 10-year ASCVD risk score Denman George DC Jr., et al., 2013) is: 14.9%   Values used to calculate the score:     Age: 58 years     Sex: Male     Is Non-Hispanic African American: Yes     Diabetic: Yes     Tobacco smoker: Yes     Systolic Blood Pressure: 120 mmHg     Is BP treated: No     HDL Cholesterol: 55.8 mg/dL     Total Cholesterol: 226 mg/dL I have reviewed the PMH, Fam and Soc history. Patient Active Problem List   Diagnosis Date Noted  . 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 12/03/2019    Priority: High  . Chronic post-traumatic stress disorder (PTSD) after military combat 06/03/2017    Priority: High  . Major depression, recurrent, chronic (HCC) 01/01/2017    Priority: High  . Chronic pain syndrome 09/03/2016    Priority: High  . Obesity (BMI 30-39.9) 03/15/2013    Priority: High    The patient  is asked to make an attempt to improve diet and exercise patterns to aid in medical management of this problem.    . Type 2 diabetes mellitus with diabetic polyneuropathy, without long-term current use of insulin (HCC) 12/22/2010    Priority: High    dxd in 2009   . Hyperlipidemia associated with type 2 diabetes mellitus (HCC), on statin 08/23/2006    Priority: High  . Obstructive sleep apnea 08/23/2006    Priority: High    NPSG 2006:  AHI 78/hr. Auto optimized to 13-14 cm.  Autoset 01/2011 to 13 cm.   . Chronic low back pain 08/23/2006  Priority: High    Using back brace.  Using as needed Percocet.    . Seizure disorder (HCC) 08/23/2006    Priority: High  . History of CVA (cerebrovascular accident) 08/23/2006    Priority: High  . Insomnia 07/15/2015    Priority: Medium  . Cervical spondylosis without myelopathy 02/06/2015    Priority: Medium  . BPH (benign prostatic hyperplasia) 11/15/2012    Priority: Medium  . Chronic pain of both knees 12/08/2008    Priority: Medium    Severe. Daily pain 8/10, down to 7/10 with antiinflammatory. Interfering with life. Limited exercise due to pain.      . Gout 08/23/2006    Priority: Medium  . Vitamin B 12 deficiency 12/09/2017    Priority: Low  . Allergic rhinitis 08/23/2006    Priority: Low    Generally controlled with Zyrtec.     Social History: Patient  reports that he quit smoking about 29 years ago. His smoking use included cigarettes. He quit after 4.00 years of use. He has never used smokeless tobacco. He reports that he does not drink alcohol and does not use drugs.  Review of Systems: Ophthalmic: negative for eye pain but positive for redness, see above, loss of vision or double vision Cardiovascular: negative for chest pain Respiratory: negative for SOB or persistent cough Gastrointestinal: negative for abdominal pain Genitourinary: negative for dysuria or gross hematuria MSK: negative for foot  lesions Neurologic: negative for weakness or gait disturbance  Objective  Vitals: BP 120/78   Pulse 91   Temp 98.1 F (36.7 C) (Temporal)   Resp 16   Ht 5\' 10"  (1.778 m)   Wt 210 lb (95.3 kg)   SpO2 97%   BMI 30.13 kg/m  General: well appearing, no acute distress  Psych:  Alert and oriented, normal mood and affect HEENT:  Normocephalic, atraumatic, right conjunctive a with significant medial injection up to the limbus, pupils are equal round and reactive to light, mild photophobia with exam.  Left conjunctive a normal Cardiovascular:  Nl S1 and S2, RRR  Respiratory:  Good breath sounds bilaterally, CTAB with normal effort, no rales     Diabetic education: ongoing education regarding chronic disease management for diabetes was given today. We continue to reinforce the ABC's of diabetic management: A1c (<7 or 8 dependent upon patient), tight blood pressure control, and cholesterol management with goal LDL < 100 minimally. We discuss diet strategies, exercise recommendations, medication options and possible side effects. At each visit, we review recommended immunizations and preventive care recommendations for diabetics and stress that good diabetic control can prevent other problems. See below for this patient's data.    Commons side effects, risks, benefits, and alternatives for medications and treatment plan prescribed today were discussed, and the patient expressed understanding of the given instructions. Patient is instructed to call or message via MyChart if he/she has any questions or concerns regarding our treatment plan. No barriers to understanding were identified. We discussed Red Flag symptoms and signs in detail. Patient expressed understanding regarding what to do in case of urgent or emergency type symptoms.   Medication list was reconciled, printed and provided to the patient in AVS. Patient instructions and summary information was reviewed with the patient as documented in  the AVS. This note was prepared with assistance of Dragon voice recognition software. Occasional wrong-word or sound-a-like substitutions may have occurred due to the inherent limitations of voice recognition software  This visit occurred during the SARS-CoV-2 public health emergency.  Safety protocols were in place, including screening questions prior to the visit, additional usage of staff PPE, and extensive cleaning of exam room while observing appropriate contact time as indicated for disinfecting solutions.

## 2020-06-03 NOTE — Patient Instructions (Signed)
Please return in 3 months to recheck your sugars.   I will release your lab results to you on your MyChart account with further instructions. Please reply with any questions.   I have placed a referral to an eye doctor to examine your eye. I want to be sure there is no inflammation deeper in the eye. You also need a diabetic eye exam. We will call you with an appointment. Use the antibiotic ointment in the meantime.    If you have any questions or concerns, please don't hesitate to send me a message via MyChart or call the office at [phone]. Thank you for visiting with [place] today! It's our pleasure caring for you.

## 2020-06-09 DIAGNOSIS — E119 Type 2 diabetes mellitus without complications: Secondary | ICD-10-CM | POA: Diagnosis not present

## 2020-06-09 DIAGNOSIS — H1045 Other chronic allergic conjunctivitis: Secondary | ICD-10-CM | POA: Diagnosis not present

## 2020-06-09 DIAGNOSIS — H2513 Age-related nuclear cataract, bilateral: Secondary | ICD-10-CM | POA: Diagnosis not present

## 2020-06-09 DIAGNOSIS — H04123 Dry eye syndrome of bilateral lacrimal glands: Secondary | ICD-10-CM | POA: Diagnosis not present

## 2020-06-09 LAB — HM DIABETES EYE EXAM

## 2020-06-11 ENCOUNTER — Encounter: Payer: Self-pay | Admitting: Family Medicine

## 2020-07-02 ENCOUNTER — Encounter: Payer: Self-pay | Admitting: Family Medicine

## 2020-07-03 ENCOUNTER — Other Ambulatory Visit: Payer: Self-pay

## 2020-07-03 DIAGNOSIS — G4733 Obstructive sleep apnea (adult) (pediatric): Secondary | ICD-10-CM

## 2020-07-06 ENCOUNTER — Encounter: Payer: Self-pay | Admitting: Family Medicine

## 2020-07-15 ENCOUNTER — Ambulatory Visit: Payer: No Typology Code available for payment source | Attending: Neurosurgery

## 2020-07-15 ENCOUNTER — Other Ambulatory Visit: Payer: Self-pay

## 2020-07-15 DIAGNOSIS — G8929 Other chronic pain: Secondary | ICD-10-CM

## 2020-07-15 DIAGNOSIS — M5386 Other specified dorsopathies, lumbar region: Secondary | ICD-10-CM

## 2020-07-15 DIAGNOSIS — M5442 Lumbago with sciatica, left side: Secondary | ICD-10-CM | POA: Insufficient documentation

## 2020-07-15 DIAGNOSIS — R262 Difficulty in walking, not elsewhere classified: Secondary | ICD-10-CM | POA: Diagnosis present

## 2020-07-15 DIAGNOSIS — M4310 Spondylolisthesis, site unspecified: Secondary | ICD-10-CM | POA: Diagnosis not present

## 2020-07-16 NOTE — Therapy (Signed)
[facility] [address] [place], [place], [address] Phone: [phone]   Fax:  [phone]  Physical Therapy Evaluation  Patient Details  Name: [name] MRN: [id number] Date of Birth: [birth year] Referring Provider (PT): Julio Sicks, MD   Encounter Date: 07/15/2020   PT End of Session - 07/16/20 1720     Visit Number 1    Number of Visits 16    Date for PT Re-Evaluation 09/12/20    Authorization Type [facility] [employer]    Authorization Time Period 03/09/20-03/09/21. Re-assess FOTO on the 5th and 10th visits    Authorization - Visit Number 0    Authorization - Number of Visits 15    Progress Note Due on Visit 10    PT Start Time 0800    PT Stop Time 0850    PT Time Calculation (min) 50 min    Activity Tolerance Patient tolerated treatment well    Behavior During Therapy WFL for tasks assessed/performed             Past Medical History:  Diagnosis Date   Adrenal abnormality (HCC)    Allergy    Anxiety    Arthritis    Atypical chest pain    BPH (benign prostatic hypertrophy)    Complication of anesthesia    Hard time waking up after neck surgery   Depression    DM (diabetes mellitus) (HCC)    type II   GERD (gastroesophageal reflux disease)    "not since Gastric Bypass"   Gout    H/O: CVA (cardiovascular accident)    Hyperlipidemia    Knee pain, bilateral    LBP (low back pain)    OSA (obstructive sleep apnea)    Plantar fasciitis    PTSD (post-traumatic stress disorder)    Seizure disorder (HCC)    Stroke (HCC) 2001   some memory loss- math , science     Past Surgical History:  Procedure Laterality Date   CARPAL TUNNEL RELEASE Bilateral    COLONOSCOPY     ENDOSCOPIC PLANTAR FASCIOTOMY     ESOPHAGOGASTRODUODENOSCOPY     GASTRIC ROUX-EN-Y N/A 12/03/2019   Procedure: LAPAROSCOPIC ROUX-EN-Y GASTRIC BYPASS WITH UPPER ENDOSCOPY;  Surgeon: Luretha Murphy, MD;  Location: [facility];  Service:  General;  Laterality: N/A;   HEEL SPUR SURGERY Left    LUMBAR LAMINECTOMY/DECOMPRESSION MICRODISCECTOMY  2022   Dr. Jordan Likes   NECK SURGERY     Titanium Plate in Vertebrae   SHOULDER SURGERY Right    TRIGGER FINGER RELEASE Right 05/14/2015   Procedure: RIGHT THUMB TRIGGER RELEASE ;  Surgeon: Betha Loa, MD;  Location: [facility];  Service: Orthopedics;  Laterality: Right;    There were no vitals filed for this visit.    Subjective Assessment - 07/16/20 2304     Subjective Pt reports alot of stiffness and and decreased QOL.    Limitations Sitting;Walking;Standing;Lifting    How long can you sit comfortably? 15 mins    How long can you stand comfortably? 30-40 mins    How long can you walk comfortably? Alternating positions    Diagnostic tests 04/03/20 lumbar xray:    FINDINGS:  AP and lateral view intraoperative fluoroscopic images of the  lumbosacral spine are submitted, 2 images total. The images  demonstrate bilateral pedicle screws at the L5 and S1 levels.  Vertical interconnecting rods were not present at the time the  images were taken. An L5-S1 interbody device is also  present.  Overlying retractors.    Patient Stated Goals Pt states wanting to be able to sleep through the night and enjoy grandkids    Currently in Pain? Yes    Pain Score 7     Pain Location Back    Pain Orientation Left    Pain Descriptors / Indicators Aching;Sharp;Tightness    Pain Type Chronic pain    Pain Radiating Towards occasionally L lateral LE    Pain Onset More than a month ago    Pain Frequency Constant    Aggravating Factors  Pronged sitting, coughing, certain movements    Pain Relieving Factors back brace, heat and cold, massage gun, topical analgesic creams                [facility] PT Assessment - 07/16/20 0001       Assessment   Medical Diagnosis Spondylolisthesis, site unspecified    Referring Provider (PT) Julio Sicks, MD    Onset Date/Surgical Date 04/03/20    Hand  Dominance Right    Prior Therapy yes   Home health PT     Precautions   Precautions Back    Precaution Comments no lifting more than 25lbs; avoid excessive bending and prolonged sitting      Restrictions   Weight Bearing Restrictions No      Balance Screen   Has the patient fallen in the past 6 months No      Home Environment   Living Environment Private residence    Living Arrangements Spouse/significant other    Type of Home Apartment    Home Access Level entry    Home Layout One level      Prior Function   Level of Independence Independent    Vocation On disability      Cognition   Overall Cognitive Status Within Functional Limits for tasks assessed      Observation/Other Assessments   Focus on Therapeutic Outcomes (FOTO)  37% fxal ability; 51% predicted      Sensation   Light Touch Appears Intact   numbness dwon lateral L LE     Posture/Postural Control   Posture/Postural Control --    Posture Comments Posture is WNLs      Deep Tendon Reflexes   DTR Assessment Site Patella;Achilles    Patella DTR 2+      ROM / Strength   AROM / PROM / Strength AROM;Strength      AROM   Overall AROM Comments All lumbar motions were limited by pain    AROM Assessment Site Lumbar    Lumbar Flexion Hands to prox patellas    Lumbar Extension 90% limitation    Lumbar - Right Side Bend R hand to 1'above prox patella    Lumbar - Left Side Bend L hand to 2" above the prox patella    Lumbar - Right Rotation 50% limited    Lumbar - Left Rotation 90% limited      Strength   Overall Strength Comments LE myotomal screen was neg. grossly bilat hip strength = 4-/5 and limited due to pain. Bilat knees were 4/5 and limted by pain      Flexibility   Soft Tissue Assessment /Muscle Length yes    Hamstrings L 10d; R 41d      Transfers   Transfers Sit to Stand;Stand to Sit    Sit to Stand 7: Independent      Ambulation/Gait   Ambulation/Gait Yes    Ambulation/Gait Assistance 7:  Independent  Gait Pattern Step-through pattern    Gait velocity Decreased pace                        Objective measurements completed on examination: See above findings.       [facility] Adult PT Treatment/Exercise - 07/16/20 0001       Exercises   Exercises Lumbar      Lumbar Exercises: Stretches   Active Hamstring Stretch Right;Left;2 reps;20 seconds    Active Hamstring Stretch Limitations sitting LAQ c ankle DF    Piriformis Stretch Right;Left;2 reps;20 seconds    Other Lumbar Stretch Exercise Seated child's pose c table top 3x10 forward and laterally      Lumbar Exercises: Supine   Pelvic Tilt 10 reps   3 secs                   PT Education - 07/16/20 2235     Education Details Eval findings, POC, HEP, recommendations for positions of comfort and support for sleeping.    Methods Demonstration;Tactile cues;Explanation;Verbal cues;Handout    Comprehension Verbalized understanding;Returned demonstration;Verbal cues required;Tactile cues required              PT Short Term Goals - 07/16/20 2249       PT SHORT TERM GOAL #1   Title Pt will be ind in an initial HEP    Baseline started on eval    Status New    Target Date 08/06/20      PT SHORT TERM GOAL #2   Title Ptwill voice understanding of measures to assist in the management and reduction of pain    Status New    Target Date 08/06/20               PT Long Term Goals - 07/16/20 2251       PT LONG TERM GOAL #1   Title Increase trunk AROMs by at least 25% to improve pt's ability to complete functional activities    Status New    Target Date 09/12/20      PT LONG TERM GOAL #2   Title Increase bilat knee and hip strength to at least 4+/5 strength for improved function ability    Status New    Target Date 09/12/20      PT LONG TERM GOAL #3   Title Pt will report 50% improvement in his ability to sleep    Status New    Target Date 09/12/20      PT LONG TERM GOAL #4    Title Pt will report a decrease in his pain range with ADL to 4-7/10 for improved QOL    Baseline 7-10/10    Status New    Target Date 09/12/20      PT LONG TERM GOAL #5   Title Pt's FOTO score will increase to he predicted value of 51% functional ability    Baseline 37%    Status New    Target Date 09/12/20      Additional Long Term Goals   Additional Long Term Goals Yes      PT LONG TERM GOAL #6   Title Pt will be Ind in a final HEP to maintain or progress achieved LOF    Status New    Target Date 09/12/20                    Plan - 07/16/20 1733     Clinical Impression  Statement Pt presents to PT following surgical intervention for L5-S1 spodylolysthesis Procedure Included: Bilateral L5-S1 decompressive laminotomies and foraminotomies, more than would be required for simple interbody fusion alone.     L5-S1 posterior lumbar interbody fusion utilizing interbody cages, locally harvested autograft and morselized allograft. L5-S1 posterior lateral arthrodesis utilizing nonsegmental pedicle screw fixation and local autografting. With today's eval, pt demonstrates significantly decreased trunk ROM and decreased hip and knee strength. Pt was initiated on a HEP for lumbopelvic mobility and strengthening. Pt will benefit from PT 2w8 to increase strength and mobility and address pain to optimize his functional abilities.,    Personal Factors and Comorbidities Time since onset of injury/illness/exacerbation;Comorbidity 1;Comorbidity 2;Comorbidity 3+    Comorbidities see medical Hx    Examination-Activity Limitations Lift;Squat;Stand;Locomotion Level;Transfers    Examination-Participation Restrictions Other   ADLs   Stability/Clinical Decision Making Evolving/Moderate complexity    Clinical Decision Making Moderate    Rehab Potential Good    PT Frequency 2x / week    PT Duration 8 weeks    PT Treatment/Interventions ADLs/Self Care Home Management;Aquatic Therapy;Electrical  Stimulation;Iontophoresis 4mg /ml Dexamethasone;Moist Heat;Therapeutic exercise;Therapeutic activities;Functional mobility training;Stair training;Gait training;Patient/family education;Manual techniques;Spinal Manipulations;Taping    PT Next Visit Plan Assess response to HEP. Progress ther ex as indicated. Complete 5xSTS and and set goals. Review FOTO score    PT Home Exercise Plan [email or web address]    Consulted and Agree with Plan of Care Patient             Patient will benefit from skilled therapeutic intervention in order to improve the following deficits and impairments:  Decreased range of motion, Difficulty walking, Decreased activity tolerance, Pain, Decreased mobility, Decreased strength  Visit Diagnosis: Spondylolisthesis, unspecified spinal region  Chronic bilateral low back pain with left-sided sciatica  Decreased ROM of lumbar spine  Difficulty in walking, not elsewhere classified     Problem List Patient Active Problem List   Diagnosis Date Noted   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 12/03/2019   Vitamin B 12 deficiency 12/09/2017   Chronic post-traumatic stress disorder (PTSD) after military combat 06/03/2017   Major depression, recurrent, chronic (HCC) 01/01/2017   Chronic pain syndrome 09/03/2016   Insomnia 07/15/2015   Cervical spondylosis without myelopathy 02/06/2015   Obesity (BMI 30-39.9) 03/15/2013   BPH (benign prostatic hyperplasia) 11/15/2012   Type 2 diabetes mellitus with diabetic polyneuropathy, without long-term current use of insulin (HCC) 12/22/2010   Chronic pain of both knees 12/08/2008   Hyperlipidemia associated with type 2 diabetes mellitus (HCC), on statin 08/23/2006   Gout 08/23/2006   Obstructive sleep apnea 08/23/2006   Allergic rhinitis 08/23/2006   Chronic low back pain 08/23/2006   Seizure disorder (HCC) 08/23/2006   History of CVA (cerebrovascular accident) 08/23/2006    08/25/2006 MS, PT 07/16/20 11:08 PM   [facility] [address] [place], [place], [place] Phone: [phone]   Fax:  [phone]  Name: KOLDEN DUPEE MRN: Vicenta Aly Date of Birth: [birth year]

## 2020-07-22 ENCOUNTER — Ambulatory Visit: Payer: No Typology Code available for payment source

## 2020-07-22 ENCOUNTER — Other Ambulatory Visit: Payer: Self-pay

## 2020-07-22 DIAGNOSIS — M4310 Spondylolisthesis, site unspecified: Secondary | ICD-10-CM | POA: Diagnosis not present

## 2020-07-22 DIAGNOSIS — M5386 Other specified dorsopathies, lumbar region: Secondary | ICD-10-CM

## 2020-07-22 DIAGNOSIS — R262 Difficulty in walking, not elsewhere classified: Secondary | ICD-10-CM

## 2020-07-22 DIAGNOSIS — G8929 Other chronic pain: Secondary | ICD-10-CM

## 2020-07-22 NOTE — Therapy (Signed)
[facility] [address] [place], [place], [address] Phone: [phone]   Fax:  [phone]  Physical Therapy Treatment  Patient Details  Name: [name] MRN: [id number] Date of Birth: 09-27-1970 Referring Provider (PT): Julio Sicks, MD   Encounter Date: 07/22/2020   PT End of Session - 07/22/20 1321     Visit Number 2    Number of Visits 16    Date for PT Re-Evaluation 09/12/20    Authorization Type [facility] [employer] [facility]    Authorization Time Period 03/09/20-03/09/21. Re-assess FOTO on the 5th and 10th visits    Authorization - Number of Visits 15    Progress Note Due on Visit 10    PT Start Time 0850    PT Stop Time [phone]    PT Time Calculation (min) 48 min    Activity Tolerance Patient tolerated treatment well    Behavior During Therapy WFL for tasks assessed/performed             Past Medical History:  Diagnosis Date   Adrenal abnormality (HCC)    Allergy    Anxiety    Arthritis    Atypical chest pain    BPH (benign prostatic hypertrophy)    Complication of anesthesia    Hard time waking up after neck surgery   Depression    DM (diabetes mellitus) (HCC)    type II   GERD (gastroesophageal reflux disease)    "not since Gastric Bypass"   Gout    H/O: CVA (cardiovascular accident)    Hyperlipidemia    Knee pain, bilateral    LBP (low back pain)    OSA (obstructive sleep apnea)    Plantar fasciitis    PTSD (post-traumatic stress disorder)    Seizure disorder (HCC)    Stroke (HCC) 2001   some memory loss- math , science     Past Surgical History:  Procedure Laterality Date   CARPAL TUNNEL RELEASE Bilateral    COLONOSCOPY     ENDOSCOPIC PLANTAR FASCIOTOMY     ESOPHAGOGASTRODUODENOSCOPY     GASTRIC ROUX-EN-Y N/A 12/03/2019   Procedure: LAPAROSCOPIC ROUX-EN-Y GASTRIC BYPASS WITH UPPER ENDOSCOPY;  Surgeon: Luretha Murphy, MD;  Location: [facility];  Service: General;  Laterality: N/A;   HEEL SPUR  SURGERY Left    LUMBAR LAMINECTOMY/DECOMPRESSION MICRODISCECTOMY  2022   Dr. Jordan Likes   NECK SURGERY     Titanium Plate in Ver[place]brae   SHOULDER SURGERY Right    TRIGGER FINGER RELEASE Right 05/14/2015   Procedure: RIGHT THUMB TRIGGER RELEASE ;  Surgeon: Betha Loa, MD;  Location: [facility];  Service: Orthopedics;  Laterality: Right;    There were no vitals filed for this visit.   Subjective Assessment - 07/22/20 0855     Subjective Pt reports he has been able to complete the HEP 3x since the eval. Pt states he uses a TENs unit, cold /hot packs, analgesic creams, and pain meds to assist c pain managemnt    Patient Stated Goals Pt states wanting to be able to sleep through the night and enjoy grandkids    Currently in Pain? Yes    Pain Score 7     Pain Location Back    Pain Orientation Left    Pain Descriptors / Indicators Aching;Sharp;Tightness    Pain Type Chronic pain    Pain Radiating Towards to L LE to the knee    Pain Onset More than a month ago  Pain Frequency Constant                               [facility] Adult PT Treatment/Exercise - 07/22/20 0001       Exercises   Exercises Lumbar      Lumbar Exercises: Stretches   Active Hamstring Stretch Right;Left;20 seconds;5 reps    Active Hamstring Stretch Limitations sitting LAQ c ankle DF    Lower Trunk Rotation 5 reps;10 seconds    Other Lumbar Stretch Exercise Forward trunf flexion and SB c PBall, 6x20      Lumbar Exercises: Supine   Pelvic Tilt 10 reps   3 secs   Glut Set 15 reps;3 seconds    Clam 15 reps    Clam Limitations green Tband    Bent Knee Raise 10 reps    Bent Knee Raise Limitations 4 sets    Bridge Limitations attempted: painful and weak for pt    Other Supine Lumbar Exercises hip add sets c ball squeezes15x, 3 sec    Other Supine Lumbar Exercises D2 UEflexion; Shouder hor. abd; 15x  c red Tband                    PT Education - 07/22/20 1321     Education  Details Updated HEP    Person(s) Educated Patient    Methods Explanation;Demonstration;Tactile cues;Verbal cues;Handout    Comprehension Verbalized understanding;Returned demonstration;Verbal cues required;Tactile cues required              PT Short Term Goals - 07/16/20 2249       PT SHORT TERM GOAL #1   Title Pt will be ind in an initial HEP    Baseline started on eval    Status New    Target Date 08/06/20      PT SHORT TERM GOAL #2   Title Ptwill voice understanding of measures to assist in the management and reduction of pain    Status New    Target Date 08/06/20               PT Long Term Goals - 07/16/20 2251       PT LONG TERM GOAL #1   Title Increase trunk AROMs by at least 25% to improve pt's ability to complete functional activities    Status New    Target Date 09/12/20      PT LONG TERM GOAL #2   Title Increase bilat knee and hip strength to at least 4+/5 strength for improved function ability    Status New    Target Date 09/12/20      PT LONG TERM GOAL #3   Title Pt will report 50% improvement in his ability to sleep    Status New    Target Date 09/12/20      PT LONG TERM GOAL #4   Title Pt will report a decrease in his pain range with ADL to 4-7/10 for improved QOL    Baseline 7-10/10    Status New    Target Date 09/12/20      PT LONG TERM GOAL #5   Title Pt's FOTO score will increase to he predicted value of 51% functional ability    Baseline 37%    Status New    Target Date 09/12/20      Additional Long Term Goals   Additional Long Term Goals Yes      PT LONG  TERM GOAL #6   Title Pt will be Ind in a final HEP to maintain or progress achieved LOF    Status New    Target Date 09/12/20                   Plan - 07/22/20 1322     Clinical Impression Statement Therex was completed for lumbopelvic mobility and strengthening. Pt returned demonstration of exs and the therex were added to the pt's written HEP.Pt tolerated the PT  session without adverse effects. The pt did have difficulty completing a bridge, which attempted one time, due to pain and weakness. Glut sets were the subsituted for this ex.    Personal Factors and Comorbidities Time since onset of injury/illness/exacerbation;Comorbidity 1;Comorbidity 2;Comorbidity 3+    Comorbidities see medical Hx    Examination-Activity Limitations Lift;Squat;Stand;Locomotion Level;Transfers    Examination-Participation Restrictions Other   ADLs   Stability/Clinical Decision Making Evolving/Moderate complexity    Clinical Decision Making Moderate    Rehab Potential Good    PT Frequency 2x / week    PT Duration 8 weeks    PT Treatment/Interventions ADLs/Self Care Home Management;Aquatic Therapy;Electrical Stimulation;Iontophoresis 4mg /ml Dexamethasone;Moist Heat;Therapeutic exercise;Therapeutic activities;Functional mobility training;Stair training;Gait training;Patient/family education;Manual techniques;Spinal Manipulations;Taping    PT Next Visit Plan Assess response to HEP. Progress ther ex as indicated. Complete 5xSTS and and set goals. Review FOTO score    PT Home Exercise Plan [email or web address]    Consulted and Agree with Plan of Care Patient             Patient will benefit from skilled therapeutic intervention in order to improve the following deficits and impairments:  Decreased range of motion, Difficulty walking, Decreased activity tolerance, Pain, Decreased mobility, Decreased strength  Visit Diagnosis: Spondylolisthesis, unspecified spinal region  Chronic bilateral low back pain with left-sided sciatica  Decreased ROM of lumbar spine  Difficulty in walking, not elsewhere classified     Problem List Patient Active Problem List   Diagnosis Date Noted   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 12/03/2019   Vitamin B 12 deficiency 12/09/2017   Chronic post-traumatic stress disorder (PTSD) after military combat 06/03/2017   Major depression, recurrent,  chronic (HCC) 01/01/2017   Chronic pain syndrome 09/03/2016   Insomnia 07/15/2015   Cervical spondylosis without myelopathy 02/06/2015   Obesity (BMI 30-39.9) 03/15/2013   BPH (benign prostatic hyperplasia) 11/15/2012   Type 2 diabetes mellitus with diabetic polyneuropathy, without long-term current use of insulin (HCC) 12/22/2010   Chronic pain of both knees 12/08/2008   Hyperlipidemia associated with type 2 diabetes mellitus (HCC), on statin 08/23/2006   Gout 08/23/2006   Obstructive sleep apnea 08/23/2006   Allergic rhinitis 08/23/2006   Chronic low back pain 08/23/2006   Seizure disorder (HCC) 08/23/2006   History of CVA (cerebrovascular accident) 08/23/2006   08/25/2006 MS, PT 07/22/20 1:33 PM   [facility] [address] [place], [place], [place] Phone: [phone]   Fax:  [phone]  Name: JADIE COMAS MRN: Vicenta Aly Date of Birth: 22-Jun-1970

## 2020-07-24 ENCOUNTER — Ambulatory Visit: Payer: No Typology Code available for payment source

## 2020-07-25 ENCOUNTER — Other Ambulatory Visit: Payer: Self-pay

## 2020-07-25 ENCOUNTER — Ambulatory Visit: Payer: No Typology Code available for payment source

## 2020-07-25 DIAGNOSIS — M4310 Spondylolisthesis, site unspecified: Secondary | ICD-10-CM | POA: Diagnosis not present

## 2020-07-25 DIAGNOSIS — R262 Difficulty in walking, not elsewhere classified: Secondary | ICD-10-CM

## 2020-07-25 DIAGNOSIS — M5386 Other specified dorsopathies, lumbar region: Secondary | ICD-10-CM

## 2020-07-25 DIAGNOSIS — G8929 Other chronic pain: Secondary | ICD-10-CM

## 2020-07-25 DIAGNOSIS — M5442 Lumbago with sciatica, left side: Secondary | ICD-10-CM

## 2020-07-25 NOTE — Therapy (Signed)
[facility]patient Rehabilitation [facility] [address] [place], [place], [address] Phone: [phone]   Fax:  [phone]  Physical Therapy Treatment  Patient Details  Name: [name] MRN: [id number] Date of Birth: 1970/06/02 Referring Provider (PT): Julio Sicks, MD   Encounter Date: 07/25/2020   PT End of Session - 07/25/20 0909     Visit Number 3    Number of Visits 16    Date for PT Re-Evaluation 09/12/20    Authorization Type [facility] [employer] [facility] [facility]    Authorization Time Period 03/09/20-03/09/21. Re-assess FOTO on the 5th and 10th visits    Authorization - Number of Visits 15    Progress Note Due on Visit 10    PT Start Time 0906    PT Stop Time 0947    PT Time Calculation (min) 41 min    Activity Tolerance Patient tolerated treatment well    Behavior During Therapy WFL for tasks assessed/performed             Past Medical History:  Diagnosis Date   Adrenal abnormality (HCC)    Allergy    Anxiety    Arthritis    Atypical chest pain    BPH (benign prostatic hypertrophy)    Complication of anesthesia    Hard time waking up after neck surgery   Depression    DM (diabetes mellitus) (HCC)    type II   GERD (gastroesophageal reflux disease)    "not since Gastric Bypass"   Gout    H/O: CVA (cardiovascular accident)    Hyperlipidemia    Knee pain, bilateral    LBP (low back pain)    OSA (obstructive sleep apnea)    Plantar fasciitis    PTSD (post-traumatic stress disorder)    Seizure disorder (HCC)    Stroke (HCC) 2001   some memory loss- math , science     Past Surgical History:  Procedure Laterality Date   CARPAL TUNNEL RELEASE Bilateral    COLONOSCOPY     ENDOSCOPIC PLANTAR FASCIOTOMY     ESOPHAGOGASTRODUODENOSCOPY     GASTRIC ROUX-EN-Y N/A 12/03/2019   Procedure: LAPAROSCOPIC ROUX-EN-Y GASTRIC BYPASS WITH UPPER ENDOSCOPY;  Surgeon: Luretha Murphy, MD;  Location: [facility];  Service: General;  Laterality: N/A;   HEEL SPUR  SURGERY Left    LUMBAR LAMINECTOMY/DECOMPRESSION MICRODISCECTOMY  2022   Dr. Jordan Likes   NECK SURGERY     Titanium Plate in Vertebrae   SHOULDER SURGERY Right    TRIGGER FINGER RELEASE Right 05/14/2015   Procedure: RIGHT THUMB TRIGGER RELEASE ;  Surgeon: Betha Loa, MD;  Location: [facility];  Service: Orthopedics;  Laterality: Right;    There were no vitals filed for this visit.   Subjective Assessment - 07/25/20 0921     Subjective Pt reports having L leg numbess on Thursday when he sat down in the afternoresolved.on to have a cup of coffee. With ex and various techniques the pt uses to address his pain the numbness    Diagnostic tests 04/03/20 lumbar xray:    FINDINGS:  AP and lateral view intraoperative fluoroscopic images of the  lumbosacral spine are submitted, 2 images total. The images  demonstrate bilateral pedicle screws at the L5 and S1 levels.  Vertical interconnecting rods were not present at the time the  images were taken. An L5-S1 interbody device is also present.  Overlying retractors.    Patient Stated Goals Pt states wanting to be able to sleep through the night  and enjoy grandkids    Currently in Pain? Yes    Pain Score 7     Pain Location Back    Pain Orientation Left;Posterior;Lower    Pain Descriptors / Indicators Aching;Sharp;Tightness    Pain Type Chronic pain    Pain Onset More than a month ago    Pain Frequency Constant                               [facility] Adult PT Treatment/Exercise - 07/25/20 0001       Exercises   Exercises Lumbar      Lumbar Exercises: Stretches   Active Hamstring Stretch Right;Left;20 seconds;5 reps    Active Hamstring Stretch Limitations sitting LAQ c ankle DF    Lower Trunk Rotation 5 reps;10 seconds    Piriformis Stretch Right;Left;2 reps;20 seconds    Other Lumbar Stretch Exercise trunk flexion and laterally c green TBall, 6x 20 sec      Lumbar Exercises: Aerobic   Nustep 5 mins; L5; UEs/LEs       Lumbar Exercises: Seated   Other Seated Lumbar Exercises Black Tball press downs for ab contraction and lumbar spine axial lengthening      Lumbar Exercises: Supine   Pelvic Tilt 10 reps;5 reps   3 secs   Pelvic Tilt Limitations c glut set   pt did not tolerate as well as seated ball presses   Clam 15 reps    Clam Limitations green Tband    Bent Knee Raise 10 reps    Bent Knee Raise Limitations 2 reps    Other Supine Lumbar Exercises hip add sets c ball squeezes15x, 3 sec      Manual Therapy   Manual Therapy Soft tissue mobilization;Taping    Manual therapy comments KT taping for the lower thorarci and lumbar paraspinal inhibition    Soft tissue mobilization STM to the T10-L3 paraspinals c light trigger point pressure as per pt's tolerance                    PT Education - 07/25/20 1010     Education Details Use of KT tape and precaution for allergic reaction to adhesive tape    Person(s) Educated Patient    Methods Explanation    Comprehension Verbalized understanding              PT Short Term Goals - 07/16/20 2249       PT SHORT TERM GOAL #1   Title Pt will be ind in an initial HEP    Baseline started on eval    Status New    Target Date 08/06/20      PT SHORT TERM GOAL #2   Title Ptwill voice understanding of measures to assist in the management and reduction of pain    Status New    Target Date 08/06/20               PT Long Term Goals - 07/16/20 2251       PT LONG TERM GOAL #1   Title Increase trunk AROMs by at least 25% to improve pt's ability to complete functional activities    Status New    Target Date 09/12/20      PT LONG TERM GOAL #2   Title Increase bilat knee and hip strength to at least 4+/5 strength for improved function ability    Status New    Target  Date 09/12/20      PT LONG TERM GOAL #3   Title Pt will report 50% improvement in his ability to sleep    Status New    Target Date 09/12/20      PT LONG TERM GOAL #4    Title Pt will report a decrease in his pain range with ADL to 4-7/10 for improved QOL    Baseline 7-10/10    Status New    Target Date 09/12/20      PT LONG TERM GOAL #5   Title Pt's FOTO score will increase to he predicted value of 51% functional ability    Baseline 37%    Status New    Target Date 09/12/20      Additional Long Term Goals   Additional Long Term Goals Yes      PT LONG TERM GOAL #6   Title Pt will be Ind in a final HEP to maintain or progress achieved LOF    Status New    Target Date 09/12/20                   Plan - 07/25/20 0910     Clinical Impression Statement PT was continued for lumbopelvic mobility and strengthening. Pt tolerated wihtout overall increase in low back pain. STM was completed to the lower thoracic and upper lumbar paraspinals c light tigger point pressure. Below L4 the pt was to sensitive and not able to tolerate enough pressure to impact the paraspinals. KT tapewas then applied for the inhibition of the lower thoracic and lumbar paraspinals.    Personal Factors and Comorbidities Time since onset of injury/illness/exacerbation;Comorbidity 1;Comorbidity 2;Comorbidity 3+    Comorbidities see medical Hx    Examination-Activity Limitations Lift;Squat;Stand;Locomotion Level;Transfers    Examination-Participation Restrictions Other   ADLs   Stability/Clinical Decision Making Evolving/Moderate complexity    Clinical Decision Making Moderate    Rehab Potential Good    PT Frequency 2x / week    PT Duration 8 weeks    PT Treatment/Interventions ADLs/Self Care Home Management;Aquatic Therapy;Electrical Stimulation;Iontophoresis 4mg /ml Dexamethasone;Moist Heat;Therapeutic exercise;Therapeutic activities;Functional mobility training;Stair training;Gait training;Patient/family education;Manual techniques;Spinal Manipulations;Taping    PT Next Visit Plan Assess response to HEP and use of KT tape for paraspinal inhibtion. Progress ther ex as  indicated. Complete 5xSTS and and set goals. Review FOTO score    PT Home Exercise Plan [email or web address]    Consulted and Agree with Plan of Care Patient             Patient will benefit from skilled therapeutic intervention in order to improve the following deficits and impairments:  Decreased range of motion, Difficulty walking, Decreased activity tolerance, Pain, Decreased mobility, Decreased strength  Visit Diagnosis: Spondylolisthesis, unspecified spinal region  Chronic bilateral low back pain with left-sided sciatica  Decreased ROM of lumbar spine  Difficulty in walking, not elsewhere classified     Problem List Patient Active Problem List   Diagnosis Date Noted   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 12/03/2019   Vitamin B 12 deficiency 12/09/2017   Chronic post-traumatic stress disorder (PTSD) after military combat 06/03/2017   Major depression, recurrent, chronic (HCC) 01/01/2017   Chronic pain syndrome 09/03/2016   Insomnia 07/15/2015   Cervical spondylosis without myelopathy 02/06/2015   Obesity (BMI 30-39.9) 03/15/2013   BPH (benign prostatic hyperplasia) 11/15/2012   Type 2 diabetes mellitus with diabetic polyneuropathy, without long-term current use of insulin (HCC) 12/22/2010   Chronic pain of both knees  12/08/2008   Hyperlipidemia associated with type 2 diabetes mellitus (HCC), on statin 08/23/2006   Gout 08/23/2006   Obstructive sleep apnea 08/23/2006   Allergic rhinitis 08/23/2006   Chronic low back pain 08/23/2006   Seizure disorder (HCC) 08/23/2006   History of CVA (cerebrovascular accident) 08/23/2006    Joellyn Rued MS, PT 07/25/20 10:22 AM   [facility] [address] [place], [place], [address] Phone: [phone]   Fax:  [phone]  Name: [name] MRN: [id number] Date of Birth: 1970/05/29

## 2020-07-29 ENCOUNTER — Ambulatory Visit: Payer: No Typology Code available for payment source

## 2020-07-31 ENCOUNTER — Ambulatory Visit: Payer: No Typology Code available for payment source | Attending: Neurosurgery

## 2020-07-31 ENCOUNTER — Other Ambulatory Visit: Payer: Self-pay

## 2020-07-31 DIAGNOSIS — M5386 Other specified dorsopathies, lumbar region: Secondary | ICD-10-CM | POA: Diagnosis present

## 2020-07-31 DIAGNOSIS — R262 Difficulty in walking, not elsewhere classified: Secondary | ICD-10-CM | POA: Diagnosis present

## 2020-07-31 DIAGNOSIS — M4310 Spondylolisthesis, site unspecified: Secondary | ICD-10-CM | POA: Insufficient documentation

## 2020-07-31 DIAGNOSIS — M5442 Lumbago with sciatica, left side: Secondary | ICD-10-CM | POA: Diagnosis present

## 2020-07-31 DIAGNOSIS — G8929 Other chronic pain: Secondary | ICD-10-CM

## 2020-07-31 NOTE — Therapy (Signed)
[facility] Outpatient Rehabilitation [facility] [address] [place], [place], [address] Phone: [phone]   Fax:  [phone]  Physical Therapy Treatment  Patient Details  Name: [name] MRN: [id number] Date of Birth: [birth year] Referring Provider (PT): Julio Sicks, MD   Encounter Date: 07/31/2020   PT End of Session - 07/31/20 2230     Visit Number 4    Number of Visits 16    Date for PT Re-Evaluation 09/12/20    Authorization Type [facility] [facility]    Authorization Time Period 03/09/20-03/09/21. Re-assess FOTO on the 5th and 10th visits    Authorization - Visit Number 4    Authorization - Number of Visits 15    Progress Note Due on Visit 10    PT Start Time [phone]    PT Stop Time 0933    PT Time Calculation (min) 42 min    Activity Tolerance Patient tolerated treatment well    Behavior During Therapy WFL for tasks assessed/performed             Past Medical History:  Diagnosis Date   Adrenal abnormality (HCC)    Allergy    Anxiety    Arthritis    Atypical chest pain    BPH (benign prostatic hypertrophy)    Complication of anesthesia    Hard time waking up after neck surgery   Depression    DM (diabetes mellitus) (HCC)    type II   GERD (gastroesophageal reflux disease)    "not since Gastric Bypass"   Gout    H/O: CVA (cardiovascular accident)    Hyperlipidemia    Knee pain, bilateral    LBP (low back pain)    OSA (obstructive sleep apnea)    Plantar fasciitis    PTSD (post-traumatic stress disorder)    Seizure disorder (HCC)    Stroke (HCC) 2001   some memory loss- math , science     Past Surgical History:  Procedure Laterality Date   CARPAL TUNNEL RELEASE Bilateral    COLONOSCOPY     ENDOSCOPIC PLANTAR FASCIOTOMY     ESOPHAGOGASTRODUODENOSCOPY     GASTRIC ROUX-EN-Y N/A 12/03/2019   Procedure: LAPAROSCOPIC ROUX-EN-Y GASTRIC BYPASS WITH UPPER ENDOSCOPY;  Surgeon: Luretha Murphy, MD;  Location: [facility];  Service:  General;  Laterality: N/A;   HEEL SPUR SURGERY Left    LUMBAR LAMINECTOMY/DECOMPRESSION MICRODISCECTOMY  2022   Dr. Jordan Likes   NECK SURGERY     Titanium Plate in Vertebrae   SHOULDER SURGERY Right    TRIGGER FINGER RELEASE Right 05/14/2015   Procedure: RIGHT THUMB TRIGGER RELEASE ;  Surgeon: Betha Loa, MD;  Location: [facility];  Service: Orthopedics;  Laterality: Right;    There were no vitals filed for this visit.   Subjective Assessment - 07/31/20 0853     Subjective Pt reports his pain level is about the same, but he is tolerating more activity with exs increasing his walking from approx 1 mile to 1.25 miles. Pt notes he wore the KT for 4 days and his low back felt more comfortable. Pt reports his L leg feels weaker than the R.    Diagnostic tests 04/03/20 lumbar xray:    FINDINGS:  AP and lateral view intraoperative fluoroscopic images of the  lumbosacral spine are submitted, 2 images total. The images  demonstrate bilateral pedicle screws at the L5 and S1 levels.  Vertical interconnecting rods were not present at the time the  images were taken. An  L5-S1 interbody device is also present.  Overlying retractors.    Patient Stated Goals Pt states wanting to be able to sleep through the night and enjoy grandkids    Currently in Pain? Yes    Pain Score 7     Pain Location Back    Pain Orientation Left;Posterior;Lower    Pain Descriptors / Indicators Aching;Sharp;Tightness    Pain Type Chronic pain    Pain Radiating Towards to L LE to the knee    Pain Onset More than a month ago    Pain Frequency Constant    Aggravating Factors  Pronged sitting, coughing, certain movements    Pain Relieving Factors back brace, heat and cold, massage gun, topical analgesic creams                               [facility] Adult PT Treatment/Exercise - 07/31/20 0001       Exercises   Exercises Lumbar      Lumbar Exercises: Stretches   Active Hamstring Stretch Right;Left;20  seconds;4 reps   each   Active Hamstring Stretch Limitations sitting LAQ c ankle DF    Lower Trunk Rotation 5 reps;10 seconds    Piriformis Stretch Right;Left;2 reps;20 seconds    Figure 4 Stretch 2 reps;20 seconds    Figure 4 Stretch Limitations L and R    Other Lumbar Stretch Exercise trunk flexion and laterally c green TBall, 6x 20 sec      Lumbar Exercises: Aerobic   Nustep 5 mins; L5; UEs/LEs   completes at a good pace     Lumbar Exercises: Seated   Other Seated Lumbar Exercises Black Tball press downs for ab contraction and lumbar spine axial lengthening      Lumbar Exercises: Supine   Pelvic Tilt 10 reps;5 reps   3 secs   Pelvic Tilt Limitations c glut set    Clam 15 reps    Clam Limitations green Tband    Bent Knee Raise 10 reps    Bent Knee Raise Limitations 2 reps    Other Supine Lumbar Exercises D2 UEflexion; Shouder hor. abd; 15x  c green Tband      Manual Therapy   Manual therapy comments KT taping for the lower thoracic and lumbar paraspinal inhibition                      PT Short Term Goals - 07/16/20 2249       PT SHORT TERM GOAL #1   Title Pt will be ind in an initial HEP    Baseline started on eval    Status New    Target Date 08/06/20      PT SHORT TERM GOAL #2   Title Ptwill voice understanding of measures to assist in the management and reduction of pain    Status New    Target Date 08/06/20               PT Long Term Goals - 07/16/20 2251       PT LONG TERM GOAL #1   Title Increase trunk AROMs by at least 25% to improve pt's ability to complete functional activities    Status New    Target Date 09/12/20      PT LONG TERM GOAL #2   Title Increase bilat knee and hip strength to at least 4+/5 strength for improved function ability    Status New  Target Date 09/12/20      PT LONG TERM GOAL #3   Title Pt will report 50% improvement in his ability to sleep    Status New    Target Date 09/12/20      PT LONG TERM GOAL #4    Title Pt will report a decrease in his pain range with ADL to 4-7/10 for improved QOL    Baseline 7-10/10    Status New    Target Date 09/12/20      PT LONG TERM GOAL #5   Title Pt's FOTO score will increase to he predicted value of 51% functional ability    Baseline 37%    Status New    Target Date 09/12/20      Additional Long Term Goals   Additional Long Term Goals Yes      PT LONG TERM GOAL #6   Title Pt will be Ind in a final HEP to maintain or progress achieved LOF    Status New    Target Date 09/12/20                   Plan - 07/31/20 2231     Clinical Impression Statement Pts subjective report indicates improved walking tolerance and pt had a positive response to KT taping from the last PT session. PT continues to be provided for lumbopelvic/LE flexibility and strengthening. KT tape was also applied to the lower thoracic and lumbar paraspinals for inhibition. Pt will continue to benefit from PT to assist with pain reduction and optimized functional mobility.    Personal Factors and Comorbidities Time since onset of injury/illness/exacerbation;Comorbidity 1;Comorbidity 2;Comorbidity 3+    Comorbidities see medical Hx    Examination-Activity Limitations Lift;Squat;Stand;Locomotion Level;Transfers    Examination-Participation Restrictions Other   ADLs   Stability/Clinical Decision Making Evolving/Moderate complexity    Clinical Decision Making Moderate    Rehab Potential Good    PT Frequency 2x / week    PT Duration 8 weeks    PT Treatment/Interventions ADLs/Self Care Home Management;Aquatic Therapy;Electrical Stimulation;Iontophoresis 4mg /ml Dexamethasone;Moist Heat;Therapeutic exercise;Therapeutic activities;Functional mobility training;Stair training;Gait training;Patient/family education;Manual techniques;Spinal Manipulations;Taping    PT Home Exercise Plan [email or web address]    Consulted and Agree with Plan of Care Patient             Patient will benefit from  skilled therapeutic intervention in order to improve the following deficits and impairments:  Decreased range of motion, Difficulty walking, Decreased activity tolerance, Pain, Decreased mobility, Decreased strength  Visit Diagnosis: Spondylolisthesis, unspecified spinal region  Chronic bilateral low back pain with left-sided sciatica  Decreased ROM of lumbar spine  Difficulty in walking, not elsewhere classified     Problem List Patient Active Problem List   Diagnosis Date Noted   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 12/03/2019   Vitamin B 12 deficiency 12/09/2017   Chronic post-traumatic stress disorder (PTSD) after military combat 06/03/2017   Major depression, recurrent, chronic (HCC) 01/01/2017   Chronic pain syndrome 09/03/2016   Insomnia 07/15/2015   Cervical spondylosis without myelopathy 02/06/2015   Obesity (BMI 30-39.9) 03/15/2013   BPH (benign prostatic hyperplasia) 11/15/2012   Type 2 diabetes mellitus with diabetic polyneuropathy, without long-term current use of insulin (HCC) 12/22/2010   Chronic pain of both knees 12/08/2008   Hyperlipidemia associated with type 2 diabetes mellitus (HCC), on statin 08/23/2006   Gout 08/23/2006   Obstructive sleep apnea 08/23/2006   Allergic rhinitis 08/23/2006   Chronic low back pain 08/23/2006  Seizure disorder (HCC) 08/23/2006   History of CVA (cerebrovascular accident) 08/23/2006    Joellyn Rued MS, PT 07/31/20 10:44 PM   [facility] [address] [place], [place], [address] Phone: [phone]   Fax:  [phone]  Name: MARVELLE CAUDILL MRN: [id number] Date of Birth: 1970/02/05

## 2020-08-05 ENCOUNTER — Ambulatory Visit: Payer: No Typology Code available for payment source

## 2020-08-07 ENCOUNTER — Other Ambulatory Visit: Payer: Self-pay

## 2020-08-07 ENCOUNTER — Ambulatory Visit: Payer: No Typology Code available for payment source | Admitting: Physical Therapy

## 2020-08-07 ENCOUNTER — Ambulatory Visit: Payer: No Typology Code available for payment source

## 2020-08-07 DIAGNOSIS — M5386 Other specified dorsopathies, lumbar region: Secondary | ICD-10-CM

## 2020-08-07 DIAGNOSIS — R262 Difficulty in walking, not elsewhere classified: Secondary | ICD-10-CM

## 2020-08-07 DIAGNOSIS — G8929 Other chronic pain: Secondary | ICD-10-CM

## 2020-08-07 DIAGNOSIS — M4310 Spondylolisthesis, site unspecified: Secondary | ICD-10-CM | POA: Diagnosis not present

## 2020-08-07 DIAGNOSIS — M5442 Lumbago with sciatica, left side: Secondary | ICD-10-CM

## 2020-08-07 NOTE — Therapy (Signed)
[facility] [address] [place], [place], [address] Phone: [phone]   Fax:  [phone]  Physical Therapy Treatment  Patient Details  Name: [name] MRN: [id number] Date of Birth: Jul 03, 1970 Referring Provider (PT): Julio Sicks, MD   Encounter Date: 08/07/2020   PT End of Session - 08/07/20 0850     Visit Number 5    Number of Visits 16    Date for PT Re-Evaluation 09/12/20    Authorization Type [employer]    Authorization Time Period 03/09/20-03/09/21. Re-assess FOTO on the 5th and 10th visits    Authorization - Visit Number 5    Authorization - Number of Visits 15    Progress Note Due on Visit 10    PT Start Time [id number]    PT Stop Time 0930    PT Time Calculation (min) 43 min    Activity Tolerance Patient tolerated treatment well    Behavior During Therapy WFL for tasks assessed/performed             Past Medical History:  Diagnosis Date   Adrenal abnormality (HCC)    Allergy    Anxiety    Arthritis    Atypical chest pain    BPH (benign prostatic hypertrophy)    Complication of anesthesia    Hard time waking up after neck surgery   Depression    DM (diabetes mellitus) (HCC)    type II   GERD (gastroesophageal reflux disease)    "not since Gastric Bypass"   Gout    H/O: CVA (cardiovascular accident)    Hyperlipidemia    Knee pain, bilateral    LBP (low back pain)    OSA (obstructive sleep apnea)    Plantar fasciitis    PTSD (post-traumatic stress disorder)    Seizure disorder (HCC)    Stroke (HCC) 2001   some memory loss- math , science     Past Surgical History:  Procedure Laterality Date   CARPAL TUNNEL RELEASE Bilateral    COLONOSCOPY     ENDOSCOPIC PLANTAR FASCIOTOMY     ESOPHAGOGASTRODUODENOSCOPY     GASTRIC ROUX-EN-Y N/A 12/03/2019   Procedure: LAPAROSCOPIC ROUX-EN-Y GASTRIC BYPASS WITH UPPER ENDOSCOPY;  Surgeon: Luretha Murphy, MD;  Location: [facility];  Service:  General;  Laterality: N/A;   HEEL SPUR SURGERY Left    LUMBAR LAMINECTOMY/DECOMPRESSION MICRODISCECTOMY  2022   Dr. Jordan Likes   NECK SURGERY     Titanium Plate in [place]bra[place]   SHOULDER SURGERY Right    TRIGGER FINGER RELEASE Right 05/14/2015   Procedure: RIGHT THUMB TRIGGER RELEASE ;  Surgeon: Betha Loa, MD;  Location: [facility];  Service: Orthopedics;  Laterality: Right;    There were no vitals filed for this visit.   Subjective Assessment - 08/07/20 0850     Subjective Pt reports feeling tightness in his back. Pt reports he had a spell yesterday -- pt used ice, cream, and TENS unit. Flared up from the spell.    Limitations Sitting;Walking;Standing;Lifting    How long can you sit comfortably? 15 mins    How long can you stand comfortably? 30-40 mins    How long can you walk comfortably? Alternating positions    Diagnostic tests 04/03/20 lumbar xray:    FINDINGS:  AP and lateral view intraoperative fluoroscopic images of the  lumbosacral spine are submitted, 2 images total. The images  demonstrate bilateral pedicle screws at the L5 and S1 levels.  Vertical interconnecting rods  were not present at the time the  images were taken. An L5-S1 interbody device is also present.  Overlying retractors.    Patient Stated Goals Pt states wanting to be able to sleep through the night and enjoy grandkids    Currently in Pain? Yes    Pain Score 6     Pain Location Back    Pain Orientation Left;Posterior;Lower    Pain Onset More than a month ago                [facility] 08/07[facility]20 0001       Observation/Other Assessments   Focus on Therapeutic Outcomes (FOTO)  45%                           [facility] Adult PT Treatment/Exercise - 08/07/20 0001       Lumbar Exercises: Stretches   Hip Flexor Stretch Right;Left;20 seconds;2 reps    Other Lumbar Stretch Exercise Standing psoas stretch at wall arm extended x30 sec bilat    Other Lumbar Stretch Exercise  child's pose x20 sec, with side flexion x20 sec each      Lumbar Exercises: Supine   Other Supine Lumbar Exercises glute set x5      Lumbar Exercises: Sidelying   Other Sidelying Lumbar Exercises hip extension knee bent 2x5      Lumbar Exercises: Quadruped   Madcat/Old Horse 10 reps   "cat" into neutral spine only ("Cow" increases pain)   Straight Leg Raise 5 reps   difficulty tolerating     Manual Therapy   Manual Therapy Joint mobilization    Joint Mobilization L hip grade 3 LAD 3x30 sec   pt reports feeling like something needed to "pop"   Soft tissue mobilization STM/myofacial release to the T10-L5 paraspinals c trigger point pressure as per pt's tolerance along QL and psoas                    PT Education - 08/07/20 1201     Education Details Discussed psoas tightness contributing to his reduction in hip extension strength/AROM.    Person(s) Educated Patient    Methods Explanation;Demonstration;Tactile cues;Verbal cues;Handout    Comprehension Verbalized understanding;Returned demonstration;Verbal cues required;Tactile cues required;Need further instruction              PT Short Term Goals - 08/07/20 0935       PT SHORT TERM GOAL #1   Title Pt will be ind in an initial HEP    Baseline started on eval    Status Achieved    Target Date 08/06/20      PT SHORT TERM GOAL #2   Title Ptwill voice understanding of measures to assist in the management and reduction of pain    Status On-going    Target Date 08/06/20               PT Long Term Goals - 07/16/20 2251       PT LONG TERM GOAL #1   Title Increase trunk AROMs by at least 25% to improve pt's ability to complete functional activities    Status New    Target Date 09/12/20      PT LONG TERM GOAL #2   Title Increase bilat knee and hip strength to at least 4+/5 strength for improved function ability    Status New    Target Date 09/12/20      PT LONG  TERM GOAL #3   Title Pt will report 50%  improvement in his ability to sleep    Status New    Target Date 09/12/20      PT LONG TERM GOAL #4   Title Pt will report a decrease in his pain range with ADL to 4-7/10 for improved QOL    Baseline 7-10/10    Status New    Target Date 09/12/20      PT LONG TERM GOAL #5   Title Pt's FOTO score will increase to he predicted value of 51% functional ability    Baseline 37%    Status New    Target Date 09/12/20      Additional Long Term Goals   Additional Long Term Goals Yes      PT LONG TERM GOAL #6   Title Pt will be Ind in a final HEP to maintain or progress achieved LOF    Status New    Target Date 09/12/20                   Plan - 08/07/20 0933     Clinical Impression Statement Pt continues to have increased spasm but demos improved FOTO score to 45%. Noted psoas shortening on L with TTP along hip flexors during palpation -- added hip flexor stretching as this may also be a limiting factor to his ability to perform hip extension/glute strengthening and pulling him into increased lumbar lordosis resulting in increased lumbar paraspinal activation. Worked on performing active hip extension/glute activation in sidelying -- attempted quadruped and prone but too painful and pt unable to perform bridging due to continued weakness. May benefit from continued hip flexor stretching and hip mobs.    Personal Factors and Comorbidities Time since onset of injury/illness/exacerbation;Comorbidity 1;Comorbidity 2;Comorbidity 3+    Comorbidities see medical Hx    Examination-Activity Limitations Lift;Squat;Stand;Locomotion Level;Transfers    Examination-Participation Restrictions Other   ADLs   Stability/Clinical Decision Making Evolving/Moderate complexity    Rehab Potential Good    PT Frequency 2x / week    PT Duration 8 weeks    PT Treatment/Interventions ADLs/Self Care Home Management;Aquatic Therapy;Electrical Stimulation;Iontophoresis 4mg /ml Dexamethasone;Moist Heat;Therapeutic  exercise;Therapeutic activities;Functional mobility training;Stair training;Gait training;Patient/family education;Manual techniques;Spinal Manipulations;Taping    PT Next Visit Plan Assess response to HEP. KT tape for paraspinal inhibtion as needed. Progress ther ex as indicated. Work on improving hip flexor/psoas extensibility, and hip extension (consider leaning on counter + hip extension?). Complete 5xSTS and and set goals. Review FOTO score    PT Home Exercise Plan [id number]    Consulted and Agree with Plan of Care Patient             Patient will benefit from skilled therapeutic intervention in order to improve the following deficits and impairments:  Decreased range of motion, Difficulty walking, Decreased activity tolerance, Pain, Decreased mobility, Decreased strength  Visit Diagnosis: Spondylolisthesis, unspecified spinal region  Chronic bilateral low back pain with left-sided sciatica  Decreased ROM of lumbar spine  Difficulty in walking, not elsewhere classified     Problem List Patient Active Problem List   Diagnosis Date Noted   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 12/03/2019   Vitamin B 12 deficiency 12/09/2017   Chronic post-traumatic stress disorder (PTSD) after military combat 06/03/2017   Major depression, recurrent, chronic (HCC) 01/01/2017   Chronic pain syndrome 09/03/2016   Insomnia 07/15/2015   Cervical spondylosis without myelopathy 02/06/2015   Obesity (BMI 30-39.9) 03/15/2013  BPH (benign prostatic hyperplasia) 11/15/2012   Type 2 diabetes mellitus with diabetic polyneuropathy, without long-term current use of insulin (HCC) 12/22/2010   Chronic pain of both knees 12/08/2008   Hyperlipidemia associated with type 2 diabetes mellitus (HCC), on statin 08/23/2006   Gout 08/23/2006   Obstructive sleep apnea 08/23/2006   Allergic rhinitis 08/23/2006   Chronic low back pain 08/23/2006   Seizure disorder (HCC) 08/23/2006   History of CVA  (cerebrovascular accident) 08/23/2006    [facility] April Dell Ponto PT, DPT 08/07/2020, 12:10 PM  [facility] [address] [place], [place], [address] Phone: [phone]   Fax:  [phone]  Name: [name] MRN: [id number] Date of Birth: 24-Jan-1971

## 2020-08-12 ENCOUNTER — Other Ambulatory Visit: Payer: Self-pay

## 2020-08-12 ENCOUNTER — Ambulatory Visit: Payer: No Typology Code available for payment source

## 2020-08-12 DIAGNOSIS — M4310 Spondylolisthesis, site unspecified: Secondary | ICD-10-CM

## 2020-08-12 DIAGNOSIS — G8929 Other chronic pain: Secondary | ICD-10-CM

## 2020-08-12 DIAGNOSIS — M5386 Other specified dorsopathies, lumbar region: Secondary | ICD-10-CM

## 2020-08-12 DIAGNOSIS — R262 Difficulty in walking, not elsewhere classified: Secondary | ICD-10-CM

## 2020-08-12 DIAGNOSIS — M5442 Lumbago with sciatica, left side: Secondary | ICD-10-CM

## 2020-08-12 NOTE — Therapy (Signed)
[facility] [address] [place], [place], [address] Phone: [phone]   Fax:  [phone]  Physical Therapy Treatment  Patient Details  Name: [name] MRN: [id number] Date of Birth: 10/20/1970 Referring Provider (PT): Julio Sicks, MD   Encounter Date: 08/12/2020   PT End of Session - 08/12/20 0848     Visit Number 6    Number of Visits 16    Date for PT Re-Evaluation 09/12/20    Authorization Type [facility] [employer]    Authorization Time Period 03/09/20-03/09/21. Re-assess FOTO on 10th visits    Authorization - Visit Number 6    Authorization - Number of Visits 15    Progress Note Due on Visit 10    PT Start Time 0848    PT Stop Time 0935    PT Time Calculation (min) 47 min    Activity Tolerance Patient tolerated treatment well    Behavior During Therapy WFL for tasks assessed/performed             Past Medical History:  Diagnosis Date   Adrenal abnormality (HCC)    Allergy    Anxiety    Arthritis    Atypical chest pain    BPH (benign prostatic hypertrophy)    Complication of anesthesia    Hard time waking up after neck surgery   Depression    DM (diabetes mellitus) (HCC)    type II   GERD (gastroesophageal reflux disease)    "not since Gastric Bypass"   Gout    H/O: CVA (cardiovascular accident)    Hyperlipidemia    Knee pain, bilateral    LBP (low back pain)    OSA (obstructive sleep apnea)    Plantar fasciitis    PTSD (post-traumatic stress disorder)    Seizure disorder (HCC)    Stroke (HCC) 2001   some memory loss- math , science     Past Surgical History:  Procedure Laterality Date   CARPAL TUNNEL RELEASE Bilateral    COLONOSCOPY     ENDOSCOPIC PLANTAR FASCIOTOMY     ESOPHAGOGASTRODUODENOSCOPY     GASTRIC ROUX-EN-Y N/A 12/03/2019   Procedure: LAPAROSCOPIC ROUX-EN-Y GASTRIC BYPASS WITH UPPER ENDOSCOPY;  Surgeon: Luretha Murphy, MD;  Location: [facility];  Service: General;   Laterality: N/A;   HEEL SPUR SURGERY Left    LUMBAR LAMINECTOMY/DECOMPRESSION MICRODISCECTOMY  2022   Dr. Jordan Likes   NECK SURGERY     Titanium Plate in [place]   SHOULDER SURGERY Right    TRIGGER FINGER RELEASE Right 05/14/2015   Procedure: RIGHT THUMB TRIGGER RELEASE ;  Surgeon: Betha Loa, MD;  Location: [facility];  Service: Orthopedics;  Laterality: Right;    There were no vitals filed for this visit.   Subjective Assessment - 08/12/20 0854     Subjective Pt reports pain is the same, but mobility and function are improving.    Diagnostic tests 04/03/20 lumbar xray:    FINDINGS:  AP and lateral view intraoperative fluoroscopic images of the  lumbosacral spine are submitted, 2 images total. The images  demonstrate bilateral pedicle screws at the L5 and S1 levels.  Vertical interconnecting rods were not present at the time the  images were taken. An L5-S1 interbody device is also present.  Overlying retractors.    Patient Stated Goals Pt states wanting to be able to sleep through the night and enjoy grandkids    Currently in Pain? Yes    Pain Score 6  Pain Location Back    Pain Orientation Left;Posterior;Lower    Pain Descriptors / Indicators Aching;Sharp;Tightness    Pain Type Chronic pain    Pain Onset More than a month ago    Pain Frequency Constant                               [facility] Adult PT Treatment/Exercise - 08/12/20 0001       Exercises   Exercises Lumbar;Knee/Hip      Lumbar Exercises: Stretches   Lower Trunk Rotation 5 reps;10 seconds    Hip Flexor Stretch Right;Left;20 seconds;2 reps   standing; also with chair behaind of quad stretch   Piriformis Stretch Right;Left;1 rep;30 seconds    Figure 4 Stretch 1 rep;30 seconds    Figure 4 Stretch Limitations L and R    Other Lumbar Stretch Exercise Standing hip flexor and quad stretch      Lumbar Exercises: Aerobic   Nustep 7 mins; L5; UEs/LEs   completes at a good pace     Lumbar  Exercises: Standing   Other Standing Lumbar Exercises Hip flexor and quad stretch 2x, 30 sec      Lumbar Exercises: Seated   Sit to Stand 10 reps      Lumbar Exercises: Supine   Bent Knee Raise 10 reps    Bent Knee Raise Limitations 2 reps    Bridge 10 reps    Bridge Limitations c PPT      Manual Therapy   Joint Mobilization L and R hip grade 3 LAD 3x30 sec   pt reports feeling like something needed to "pop"   Soft tissue mobilization STM/myofacial release to the T10-L5 paraspinals and QLc trigger point pressure as per pt's tolerance.                    PT Education - 08/12/20 1304     Education Details quad/hip flexor stretch in standing    Person(s) Educated Patient    Methods Explanation;Demonstration;Tactile cues    Comprehension Verbalized understanding;Returned demonstration;Verbal cues required;Tactile cues required              PT Short Term Goals - 08/07/20 0935       PT SHORT TERM GOAL #1   Title Pt will be ind in an initial HEP    Baseline started on eval    Status Achieved    Target Date 08/06/20      PT SHORT TERM GOAL #2   Title Ptwill voice understanding of measures to assist in the management and reduction of pain    Status On-going    Target Date 08/06/20               PT Long Term Goals - 07/16/20 2251       PT LONG TERM GOAL #1   Title Increase trunk AROMs by at least 25% to improve pt's ability to complete functional activities    Status New    Target Date 09/12/20      PT LONG TERM GOAL #2   Title Increase bilat knee and hip strength to at least 4+/5 strength for improved function ability    Status New    Target Date 09/12/20      PT LONG TERM GOAL #3   Title Pt will report 50% improvement in his ability to sleep    Status New    Target Date 09/12/20  PT LONG TERM GOAL #4   Title Pt will report a decrease in his pain range with ADL to 4-7/10 for improved QOL    Baseline 7-10/10    Status New    Target Date  09/12/20      PT LONG TERM GOAL #5   Title Pt's FOTO score will increase to he predicted value of 51% functional ability    Baseline 37%    Status New    Target Date 09/12/20      Additional Long Term Goals   Additional Long Term Goals Yes      PT LONG TERM GOAL #6   Title Pt will be Ind in a final HEP to maintain or progress achieved LOF    Status New    Target Date 09/12/20                   Plan - 08/12/20 0848     Clinical Impression Statement Pt reporsed favorably to L LE LAD mob. Pt reports he has a inversion table at home which is helpful in reducing low back pressure. Pt continued with abdominal and lumbopelvic flexibility and strengthening. Continued to work on hip flexor and gluteal strengthening. Today with a PPT completed before hand, Pt was able to complete bridging. Pt contiues to provided psotive comments regarding improving function with PT, while pain has not improved overall. Several measures have been provided by PT to assist in pain management.    Personal Factors and Comorbidities Time since onset of injury/illness/exacerbation;Comorbidity 1;Comorbidity 2;Comorbidity 3+    Comorbidities see medical Hx    Examination-Activity Limitations Lift;Squat;Stand;Locomotion Level;Transfers    Examination-Participation Restrictions Other    Stability/Clinical Decision Making Evolving/Moderate complexity    Clinical Decision Making Moderate    Rehab Potential Good    PT Frequency 2x / week    PT Duration 8 weeks    PT Treatment/Interventions ADLs/Self Care Home Management;Aquatic Therapy;Electrical Stimulation;Iontophoresis 4mg /ml Dexamethasone;Moist Heat;Therapeutic exercise;Therapeutic activities;Functional mobility training;Stair training;Gait training;Patient/family education;Manual techniques;Spinal Manipulations;Taping    PT Next Visit Plan Assess response to HEP. KT tape for paraspinal inhibtion as needed. Progress ther ex as indicated. Work on improving hip  flexor/psoas extensibility, and hip extension (consider leaning on counter + hip extension?). Complete 5xSTS and and set goals. Review FOTO score    PT Home Exercise Plan [id number]    Consulted and Agree with Plan of Care Patient             Patient will benefit from skilled therapeutic intervention in order to improve the following deficits and impairments:  Decreased range of motion, Difficulty walking, Decreased activity tolerance, Pain, Decreased mobility, Decreased strength  Visit Diagnosis: Spondylolisthesis, unspecified spinal region  Chronic bilateral low back pain with left-sided sciatica  Decreased ROM of lumbar spine  Difficulty in walking, not elsewhere classified     Problem List Patient Active Problem List   Diagnosis Date Noted   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 12/03/2019   Vitamin B 12 deficiency 12/09/2017   Chronic post-traumatic stress disorder (PTSD) after military combat 06/03/2017   Major depression, recurrent, chronic (HCC) 01/01/2017   Chronic pain syndrome 09/03/2016   Insomnia 07/15/2015   Cervical spondylosis without myelopathy 02/06/2015   Obesity (BMI 30-39.9) 03/15/2013   BPH (benign prostatic hyperplasia) 11/15/2012   Type 2 diabetes mellitus with diabetic polyneuropathy, without long-term current use of insulin (HCC) 12/22/2010   Chronic pain of both knees 12/08/2008   Hyperlipidemia associated with type 2  diabetes mellitus (HCC), on statin 08/23/2006   Gout 08/23/2006   Obstructive sleep apnea 08/23/2006   Allergic rhinitis 08/23/2006   Chronic low back pain 08/23/2006   Seizure disorder (HCC) 08/23/2006   History of CVA (cerebrovascular accident) 08/23/2006   Joellyn RuedAllen Kitt Minardi MS, PT 08/12/20 1:26 PM   [facility] [address] [place], [place], [address] Phone: [phone]   Fax:  [phone]  Name: [name] MRN: [id number] Date of Birth: 03-26-1970

## 2020-08-14 ENCOUNTER — Ambulatory Visit: Payer: No Typology Code available for payment source

## 2020-08-14 ENCOUNTER — Other Ambulatory Visit: Payer: Self-pay

## 2020-08-14 DIAGNOSIS — M5386 Other specified dorsopathies, lumbar region: Secondary | ICD-10-CM

## 2020-08-14 DIAGNOSIS — R262 Difficulty in walking, not elsewhere classified: Secondary | ICD-10-CM

## 2020-08-14 DIAGNOSIS — M4310 Spondylolisthesis, site unspecified: Secondary | ICD-10-CM | POA: Diagnosis not present

## 2020-08-14 DIAGNOSIS — G8929 Other chronic pain: Secondary | ICD-10-CM

## 2020-08-15 NOTE — Therapy (Signed)
[facility] [address] [place], [place], [address] Phone: [phone]   Fax:  [phone]  Physical Therapy Treatment  Patient Details  Name: [name] MRN: [id number] Date of Birth: 07/22/1970 Referring Provider (PT): Julio Sicks, MD   Encounter Date: 08/14/2020   PT End of Session - 08/14/20 0853     Visit Number 7    Number of Visits 16    Date for PT Re-Evaluation 09/12/20    Authorization Type [facility] [employer] [facility]    Authorization Time Period 03/09/20-03/09/21. Re-assess FOTO on 10th visits    Authorization - Visit Number 7    Authorization - Number of Visits 15    Progress Note Due on Visit 10    PT Start Time 0849    PT Stop Time 0934    PT Time Calculation (min) 45 min    Activity Tolerance Patient tolerated treatment well    Behavior During Therapy WFL for tasks assessed/performed             Past Medical History:  Diagnosis Date   Adrenal abnormality (HCC)    Allergy    Anxiety    Arthritis    Atypical chest pain    BPH (benign prostatic hypertrophy)    Complication of anesthesia    Hard time waking up after neck surgery   Depression    DM (diabetes mellitus) (HCC)    type II   GERD (gastroesophageal reflux disease)    "not since Gastric Bypass"   Gout    H/O: CVA (cardiovascular accident)    Hyperlipidemia    Knee pain, bilateral    LBP (low back pain)    OSA (obstructive sleep apnea)    Plantar fasciitis    PTSD (post-traumatic stress disorder)    Seizure disorder (HCC)    Stroke (HCC) 2001   some memory loss- math , science     Past Surgical History:  Procedure Laterality Date   CARPAL TUNNEL RELEASE Bilateral    COLONOSCOPY     ENDOSCOPIC PLANTAR FASCIOTOMY     ESOPHAGOGASTRODUODENOSCOPY     GASTRIC ROUX-EN-Y N/A 12/03/2019   Procedure: LAPAROSCOPIC ROUX-EN-Y GASTRIC BYPASS WITH UPPER ENDOSCOPY;  Surgeon: Luretha Murphy, MD;  Location: [facility];  Service: General;   Laterality: N/A;   HEEL SPUR SURGERY Left    LUMBAR LAMINECTOMY/DECOMPRESSION MICRODISCECTOMY  2022   Dr. Jordan Likes   NECK SURGERY     Titanium Plate in [place]bra[place]   SHOULDER SURGERY Right    TRIGGER FINGER RELEASE Right 05/14/2015   Procedure: RIGHT THUMB TRIGGER RELEASE ;  Surgeon: Betha Loa, MD;  Location: [facility];  Service: Orthopedics;  Laterality: Right;    There were no vitals filed for this visit.   Subjective Assessment - 08/14/20 0900     Subjective Pt reports he is being active as he can tolerate and offeredno concerns.    Diagnostic tests 04/03/20 lumbar xray:    FINDINGS:  AP and lateral view intraoperative fluoroscopic images of the  lumbosacral spine are submitted, 2 images total. The images  demonstrate bilateral pedicle screws at the L5 and S1 levels.  Vertical interconnecting rods were not present at the time the  images were taken. An L5-S1 interbody device is also present.  Overlying retractors.    Patient Stated Goals Pt states wanting to be able to sleep through the night and enjoy grandkids    Currently in Pain? Yes    Pain Score 6  Pain Location Back    Pain Orientation Left;Posterior;Lower    Pain Descriptors / Indicators Aching;Sharp    Pain Onset More than a month ago    Pain Frequency Constant                               [facility] Adult PT Treatment/Exercise - 08/15/20 0001       Exercises   Exercises Lumbar;Knee/Hip      Lumbar Exercises: Stretches   Lower Trunk Rotation 5 reps;10 seconds    Hip Flexor Stretch Right;Left;20 seconds;2 reps   standing; also with chair behaind of quad stretch   Other Lumbar Stretch Exercise trunk flexion and laterally c green TBall, 6x 20 sec      Lumbar Exercises: Aerobic   Nustep 5 mins; L5; UEs/LEs   completes at a good pace     Lumbar Exercises: Standing   Other Standing Lumbar Exercises Hip extension in plantargrade 15x, L and R      Lumbar Exercises: Seated   Sit to Stand 10  reps      Lumbar Exercises: Supine   Pelvic Tilt 10 reps   3 sec   Clam 20 reps;3 seconds    Clam Limitations blue Tband    Bent Knee Raise 10 reps;5 reps;5 seconds    Bent Knee Raise Limitations ab bracing    Dead Bug 10 reps    Dead Bug Limitations LE heeel taps   Pt reports loback pain c lowering legs   Bridge 10 reps    Bridge Limitations c PPT      Manual Therapy   Manual therapy comments KT taping for the lower thoracic and lumbar paraspinal inhibition                      PT Short Term Goals - 08/07/20 0935       PT SHORT TERM GOAL #1   Title Pt will be ind in an initial HEP    Baseline started on eval    Status Achieved    Target Date 08/06/20      PT SHORT TERM GOAL #2   Title Ptwill voice understanding of measures to assist in the management and reduction of pain    Status On-going    Target Date 08/06/20               PT Long Term Goals - 08/15/20 0656       Additional Long Term Goals   Additional Long Term Goals Yes      PT LONG TERM GOAL #7   Title Improve 5xSTS time to less than 16 sec as indication of improved function.    Baseline 21.3 sec    Status New    Target Date 09/12/20                   Plan - 08/14/20 0854     Clinical Impression Statement PT was completed lumbopelvic mobility and strengthening. Ability to complete the bridging ex is improving. Initiate hip/low back extension strengthening in standing.Assess 5xSTS which is decreased in pace.    Personal Factors and Comorbidities Time since onset of injury/illness/exacerbation;Comorbidity 1;Comorbidity 2;Comorbidity 3+    Comorbidities see medical Hx    Examination-Activity Limitations Lift;Squat;Stand;Locomotion Level;Transfers    Examination-Participation Restrictions Other    Stability/Clinical Decision Making Evolving/Moderate complexity    Clinical Decision Making Moderate    Rehab Potential Good  PT Frequency 2x / week    PT Duration 8 weeks    PT  Treatment/Interventions ADLs/Self Care Home Management;Aquatic Therapy;Electrical Stimulation;Iontophoresis 4mg /ml Dexamethasone;Moist Heat;Therapeutic exercise;Therapeutic activities;Functional mobility training;Stair training;Gait training;Patient/family education;Manual techniques;Spinal Manipulations;Taping    PT Next Visit Plan Assess response to HEP. KT tape for paraspinal inhibtion as needed. Progress ther ex as indicated. Work on improving hip flexor/psoas extensibility, and hip extension (consider leaning on counter + hip extension?). Complete and set goal.    PT Home Exercise Plan [id number]    Consulted and Agree with Plan of Care Patient             Patient will benefit from skilled therapeutic intervention in order to improve the following deficits and impairments:  Decreased range of motion, Difficulty walking, Decreased activity tolerance, Pain, Decreased mobility, Decreased strength  Visit Diagnosis: Spondylolisthesis, unspecified spinal region  Chronic bilateral low back pain with left-sided sciatica  Decreased ROM of lumbar spine  Difficulty in walking, not elsewhere classified     Problem List Patient Active Problem List   Diagnosis Date Noted   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 12/03/2019   Vitamin B 12 deficiency 12/09/2017   Chronic post-traumatic stress disorder (PTSD) after military combat 06/03/2017   Major depression, recurrent, chronic (HCC) 01/01/2017   Chronic pain syndrome 09/03/2016   Insomnia 07/15/2015   Cervical spondylosis without myelopathy 02/06/2015   Obesity (BMI 30-39.9) 03/15/2013   BPH (benign prostatic hyperplasia) 11/15/2012   Type 2 diabetes mellitus with diabetic polyneuropathy, without long-term current use of insulin (HCC) 12/22/2010   Chronic pain of both knees 12/08/2008   Hyperlipidemia associated with type 2 diabetes mellitus (HCC), on statin 08/23/2006   Gout 08/23/2006   Obstructive sleep apnea 08/23/2006    Allergic rhinitis 08/23/2006   Chronic low back pain 08/23/2006   Seizure disorder (HCC) 08/23/2006   History of CVA (cerebrovascular accident) 08/23/2006    08/25/2006 MS, PT 08/15/20 7:02 AM   [facility] [address] [place], [place], [place] Phone: [phone]   Fax:  [phone]  Name: [name] MRN: Vicenta Aly Date of Birth: 19-Jun-1970

## 2020-08-18 ENCOUNTER — Ambulatory Visit (HOSPITAL_BASED_OUTPATIENT_CLINIC_OR_DEPARTMENT_OTHER): Payer: No Typology Code available for payment source | Attending: Neurosurgery | Admitting: Physical Therapy

## 2020-08-18 ENCOUNTER — Other Ambulatory Visit: Payer: Self-pay

## 2020-08-18 ENCOUNTER — Encounter (HOSPITAL_BASED_OUTPATIENT_CLINIC_OR_DEPARTMENT_OTHER): Payer: Self-pay | Admitting: Physical Therapy

## 2020-08-18 DIAGNOSIS — G8929 Other chronic pain: Secondary | ICD-10-CM | POA: Diagnosis present

## 2020-08-18 DIAGNOSIS — R262 Difficulty in walking, not elsewhere classified: Secondary | ICD-10-CM | POA: Insufficient documentation

## 2020-08-18 DIAGNOSIS — M4310 Spondylolisthesis, site unspecified: Secondary | ICD-10-CM | POA: Insufficient documentation

## 2020-08-18 DIAGNOSIS — M5386 Other specified dorsopathies, lumbar region: Secondary | ICD-10-CM | POA: Insufficient documentation

## 2020-08-18 DIAGNOSIS — M5442 Lumbago with sciatica, left side: Secondary | ICD-10-CM | POA: Diagnosis present

## 2020-08-18 NOTE — Therapy (Signed)
[facility] [address] [place], [place], [address] Phone: [phone]   Fax:  [phone]  Physical Therapy Treatment  Patient Details  Name: [name] MRN: [id number] Date of Birth: [birth year] Referring Provider (PT): Julio Sicks, MD   Encounter Date: 08/18/2020   PT End of Session - 08/18/20 1707     Visit Number 8    Number of Visits 16    Date for PT Re-Evaluation 09/12/20    Authorization Type [facility] [employer] [facility]    Authorization Time Period 03/09/20-03/09/21. Re-assess FOTO on 10th visits    PT Start Time 1701    PT Stop Time 1745    PT Time Calculation (min) 44 min    Equipment Utilized During Treatment Other (comment)   Ankle cuff and waist buoys, kick board, noodle/sqoodle, nekdoodle, weights and barbells   Activity Tolerance Patient tolerated treatment well;Patient limited by pain    Behavior During Therapy WFL for tasks assessed/performed             Past Medical History:  Diagnosis Date   Adrenal abnormality (HCC)    Allergy    Anxiety    Arthritis    Atypical chest pain    BPH (benign prostatic hypertrophy)    Complication of anesthesia    Hard time waking up after neck surgery   Depression    DM (diabetes mellitus) (HCC)    type II   GERD (gastroesophageal reflux disease)    "not since Gastric Bypass"   Gout    H/O: CVA (cardiovascular accident)    Hyperlipidemia    Knee pain, bilateral    LBP (low back pain)    OSA (obstructive sleep apnea)    Plantar fasciitis    PTSD (post-traumatic stress disorder)    Seizure disorder (HCC)    Stroke (HCC) 2001   some memory loss- math , science     Past Surgical History:  Procedure Laterality Date   CARPAL TUNNEL RELEASE Bilateral    COLONOSCOPY     ENDOSCOPIC PLANTAR FASCIOTOMY     ESOPHAGOGASTRODUODENOSCOPY     GASTRIC ROUX-EN-Y N/A 12/03/2019   Procedure: LAPAROSCOPIC ROUX-EN-Y GASTRIC BYPASS WITH UPPER ENDOSCOPY;  Surgeon:  Luretha Murphy, MD;  Location: [facility];  Service: General;  Laterality: N/A;   HEEL SPUR SURGERY Left    LUMBAR LAMINECTOMY/DECOMPRESSION MICRODISCECTOMY  2022   Dr. Jordan Likes   NECK SURGERY     Titanium Plate in [place]bra[place]   SHOULDER SURGERY Right    TRIGGER FINGER RELEASE Right 05/14/2015   Procedure: RIGHT THUMB TRIGGER RELEASE ;  Surgeon: Betha Loa, MD;  Location: [facility];  Service: Orthopedics;  Laterality: Right;    There were no vitals filed for this visit.   Subjective Assessment - 08/18/20 1801     Subjective Looking forward to neing in aquatic therapy    Currently in Pain? Yes                                       PT Education - 08/18/20 1804     Education Details properties of water and advantages of aquatic therapy    Person(s) Educated Patient    Methods Explanation;Demonstration    Comprehension Verbalized understanding              PT Short Term Goals - 08/07/20 0935       PT SHORT  TERM GOAL #1   Title Pt will be ind in an initial HEP    Baseline started on eval    Status Achieved    Target Date 08/06/20      PT SHORT TERM GOAL #2   Title Ptwill voice understanding of measures to assist in the management and reduction of pain    Status On-going    Target Date 08/06/20               PT Long Term Goals - 08/15/20 0656       Additional Long Term Goals   Additional Long Term Goals Yes      PT LONG TERM GOAL #7   Title Improve 5xSTS time to less than 16 sec as indication of improved function.    Baseline 21.3 sec    Status New    Target Date 09/12/20            Pt seen for aquatic therapy today.  Treatment took place in water 3.25-4.8 ft in depth at the [facility] pool. Temp of water was 93.  Pt entered/exited the pool via stairs step to pattern independently with bilat rail.  Pt introduced to aquatic setting all depths with instruction on buoyancy Warm up: forward, backward and  side stepping/walking cues for increased step length, increased speed, hand placement to increase resistance.  Seated Stretching: gastroc, hamstrings, adductor and glutes 3 x 20-30 sec  Standing Foam cuffs on ankles: add/abd, hip flex, hip circles, hip extension 2 x 10 reps cuing for increased speed for added resistance. Instruction and cuing on core tightening while completing for improved stability and balance Hip flex stretch 3 x 20 sec hold.  Pt with discomfort in LB. Verbal and tactile cuing for positioning, decreasing stretch/minimizing pain. Stabilized by PT LB stretch knees to chest 3 x 20-30 sec hold with gentle overpressure.  Suspended in sitting On squoodle lumbosacral gentle ROM/ flutter kicking, scissoring and add/abd movement of LE x 5 mins   Pt completes 2-4 widths of pool after every 1-2 exercises to stretch and rest.  Pt ends session in Jacuzzi massaging LB and glute area (non billed)  Pt requires buoyancy for support and to offload joints with strengthening exercises. Viscosity of the water is needed for resistance of strengthening; water current perturbations provides challenge to standing balance unsupported, requiring increased core activation.        Plan - 08/18/20 1807     Clinical Impression Statement Initiated lumbopelvic mobility in pool trying different positions and using foam floatation items to facilitate.  Completed hip and LB extension stretching and strengthening.  Pt does not tolerate prone superman position due to LB discomfort.  Needed cuing for decreased intensity when completing exercises due to facial grimacing indicating high level of pain.  He vu that he can work just inside discomfort range or "hurt so good" and benefit as intension is to decrease pain while increasing function. He is a great candidate for aquatic therapy and will benefit from continued services    Stability/Clinical Decision Making Evolving/Moderate complexity    Clinical  Decision Making Moderate    Rehab Potential Good    PT Frequency 2x / week    PT Treatment/Interventions ADLs/Self Care Home Management;Aquatic Therapy;Electrical Stimulation;Iontophoresis 4mg /ml Dexamethasone;Moist Heat;Therapeutic exercise;Therapeutic activities;Functional mobility training;Stair training;Gait training;Patient/family education;Manual techniques;Spinal Manipulations;Taping    PT Home Exercise Plan [id number]  Pt does have access to a pool.  Will add to HEP going forward.  Patient will benefit from skilled therapeutic intervention in order to improve the following deficits and impairments:  Decreased range of motion, Difficulty walking, Decreased activity tolerance, Pain, Decreased mobility, Decreased strength  Visit Diagnosis: Spondylolisthesis, unspecified spinal region  Chronic bilateral low back pain with left-sided sciatica  Decreased ROM of lumbar spine  Difficulty in walking, not elsewhere classified     Problem List Patient Active Problem List   Diagnosis Date Noted   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 12/03/2019   Vitamin B 12 deficiency 12/09/2017   Chronic post-traumatic stress disorder (PTSD) after military combat 06/03/2017   Major depression, recurrent, chronic (HCC) 01/01/2017   Chronic pain syndrome 09/03/2016   Insomnia 07/15/2015   Cervical spondylosis without myelopathy 02/06/2015   Obesity (BMI 30-39.9) 03/15/2013   BPH (benign prostatic hyperplasia) 11/15/2012   Type 2 diabetes mellitus with diabetic polyneuropathy, without long-term current use of insulin (HCC) 12/22/2010   Chronic pain of both knees 12/08/2008   Hyperlipidemia associated with type 2 diabetes mellitus (HCC), on statin 08/23/2006   Gout 08/23/2006   Obstructive sleep apnea 08/23/2006   Allergic rhinitis 08/23/2006   Chronic low back pain 08/23/2006   Seizure disorder (HCC) 08/23/2006   History of CVA (cerebrovascular accident) 08/23/2006    Jeanmarie Hubert  MPT 08/18/2020, 6:25 PM  [facility] [address] [place], [place], [address] Phone: [phone]   Fax:  [phone]  Name: [name] MRN: [id number] Date of Birth: 06-30-1970

## 2020-08-21 ENCOUNTER — Other Ambulatory Visit: Payer: Self-pay

## 2020-08-21 ENCOUNTER — Ambulatory Visit: Payer: No Typology Code available for payment source

## 2020-08-21 DIAGNOSIS — G8929 Other chronic pain: Secondary | ICD-10-CM

## 2020-08-21 DIAGNOSIS — R262 Difficulty in walking, not elsewhere classified: Secondary | ICD-10-CM

## 2020-08-21 DIAGNOSIS — M5386 Other specified dorsopathies, lumbar region: Secondary | ICD-10-CM

## 2020-08-21 DIAGNOSIS — M4310 Spondylolisthesis, site unspecified: Secondary | ICD-10-CM | POA: Diagnosis not present

## 2020-08-21 NOTE — Therapy (Signed)
[facility] [address] [place], [place], [address] Phone: [phone]   Fax:  [phone]  Physical Therapy Treatment  Patient Details  Name: [name] MRN: [id number] Date of Birth: [birth year] Referring Provider (PT): Julio Sicks, MD   Encounter Date: 08/21/2020   PT End of Session - 08/21/20 0848     Visit Number 9    Number of Visits 16    Date for PT Re-Evaluation 09/12/20    Authorization Type [facility] [employer]    Authorization Time Period 03/09/20-03/09/21. Re-assess FOTO on 10th visits    Authorization - Visit Number 8    Authorization - Number of Visits 15    Progress Note Due on Visit 10    PT Start Time 0848    PT Stop Time 0930    PT Time Calculation (min) 42 min    Activity Tolerance Patient tolerated treatment well;Patient limited by pain    Behavior During Therapy WFL for tasks assessed/performed             Past Medical History:  Diagnosis Date   Adrenal abnormality (HCC)    Allergy    Anxiety    Arthritis    Atypical chest pain    BPH (benign prostatic hypertrophy)    Complication of anesthesia    Hard time waking up after neck surgery   Depression    DM (diabetes mellitus) (HCC)    type II   GERD (gastroesophageal reflux disease)    "not since Gastric Bypass"   Gout    H/O: CVA (cardiovascular accident)    Hyperlipidemia    Knee pain, bilateral    LBP (low back pain)    OSA (obstructive sleep apnea)    Plantar fasciitis    PTSD (post-traumatic stress disorder)    Seizure disorder (HCC)    Stroke (HCC) 2001   some memory loss- math , science     Past Surgical History:  Procedure Laterality Date   CARPAL TUNNEL RELEASE Bilateral    COLONOSCOPY     ENDOSCOPIC PLANTAR FASCIOTOMY     ESOPHAGOGASTRODUODENOSCOPY     GASTRIC ROUX-EN-Y N/A 12/03/2019   Procedure: LAPAROSCOPIC ROUX-EN-Y GASTRIC BYPASS WITH UPPER ENDOSCOPY;  Surgeon: Luretha Murphy, MD;  Location: [facility];   Service: General;  Laterality: N/A;   HEEL SPUR SURGERY Left    LUMBAR LAMINECTOMY/DECOMPRESSION MICRODISCECTOMY  2022   Dr. Jordan Likes   NECK SURGERY     Titanium Plate in Ver[place]brae   SHOULDER SURGERY Right    TRIGGER FINGER RELEASE Right 05/14/2015   Procedure: RIGHT THUMB TRIGGER RELEASE ;  Surgeon: Betha Loa, MD;  Location: [facility];  Service: Orthopedics;  Laterality: Right;    There were no vitals filed for this visit.   Subjective Assessment - 08/21/20 0853     Subjective Pt reports the ROM and flexibilty exs are helping. The L low back and hip is bothering him the most. Pt reports the aquatic session was challenging.    Patient Stated Goals Pt states wanting to be able to sleep through the night and enjoy grandkids    Currently in Pain? Yes    Pain Score 7     Pain Location Back    Pain Orientation Left;Posterior;Lower    Pain Descriptors / Indicators Aching;Sharp    Pain Type Chronic pain    Pain Radiating Towards to L LE/knee    Pain Onset More than a month ago    Pain Frequency  Constant                [facility] PT Assessment - 08/21/20 0001       Palpation   SI assessment  medal malleoli are equal in supine and in long sit                           [facility] Adult PT Treatment/Exercise - 08/21/20 0001       Lumbar Exercises: Stretches   Active Hamstring Stretch Right;Left;2 reps;30 seconds    Active Hamstring Stretch Limitations seated    Hip Flexor Stretch Right;Left;2 reps;30 seconds    Hip Flexor Stretch Limitations Supine Thomas stretch    Piriformis Stretch Right;Left;2 reps;30 seconds      Lumbar Exercises: Aerobic   Nustep 5 mins; L5; UEs/LEs   completes at a good pace     Modalities   Modalities Iontophoresis      Iontophoresis   Type of Iontophoresis Dexamethasone    Location L just lateral L5    Dose 1 ml, 4 mg/ml    Time 6 hours                      PT Short Term Goals - 08/07/20 0935       PT  SHORT TERM GOAL #1   Title Pt will be ind in an initial HEP    Baseline started on eval    Status Achieved    Target Date 08/06/20      PT SHORT TERM GOAL #2   Title Ptwill voice understanding of measures to assist in the management and reduction of pain    Status On-going    Target Date 08/06/20               PT Long Term Goals - 08/15/20 0656       Additional Long Term Goals   Additional Long Term Goals Yes      PT LONG TERM GOAL #7   Title Improve 5xSTS time to less than 16 sec as indication of improved function.    Baseline 21.3 sec    Status New    Target Date 09/12/20                   Plan - 08/21/20 0935     Clinical Impression Statement PT was provided to adress L low back radiating into the L gluteal area. Innominate rotation was assess and does not appear to be misaligned. PT continued to address lumbopelvic flexibility with the [facility] hip flexor stretch completed. At end of session, Iontophoresis was applied over the most painful L low back area just lateral to the L5 vertebrae. Pt tolerated the PT session with adverse effects.    Personal Factors and Comorbidities Time since onset of injury/illness/exacerbation;Comorbidity 1;Comorbidity 2;Comorbidity 3+    Comorbidities see medical Hx    Examination-Activity Limitations Lift;Squat;Stand;Locomotion Level;Transfers    Examination-Participation Restrictions Other   ADLs   Stability/Clinical Decision Making Evolving/Moderate complexity    Clinical Decision Making Moderate    Rehab Potential Good    PT Frequency 2x / week    PT Duration 8 weeks    PT Treatment/Interventions ADLs/Self Care Home Management;Aquatic Therapy;Electrical Stimulation;Iontophoresis 4mg /ml Dexamethasone;Moist Heat;Therapeutic exercise;Therapeutic activities;Functional mobility training;Stair training;Gait training;Patient/family education;Manual techniques;Spinal Manipulations;Taping    PT Next Visit Plan Assess response to HEP. KT  tape for paraspinal inhibtion as needed. Progress ther ex as indicated. Work on  improving hip flexor/psoas extensibility, and hip extension (consider leaning on counter + hip extension?).    PT Home Exercise Plan [id number]  Pt does have access to a pool.  Will add to HEP going forward. Assess response to iontophoresis    Consulted and Agree with Plan of Care Patient             Patient will benefit from skilled therapeutic intervention in order to improve the following deficits and impairments:  Decreased range of motion, Difficulty walking, Decreased activity tolerance, Pain, Decreased mobility, Decreased strength  Visit Diagnosis: Spondylolisthesis, unspecified spinal region  Chronic bilateral low back pain with left-sided sciatica  Decreased ROM of lumbar spine  Difficulty in walking, not elsewhere classified     Problem List Patient Active Problem List   Diagnosis Date Noted   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 12/03/2019   Vitamin B 12 deficiency 12/09/2017   Chronic post-traumatic stress disorder (PTSD) after military combat 06/03/2017   Major depression, recurrent, chronic (HCC) 01/01/2017   Chronic pain syndrome 09/03/2016   Insomnia 07/15/2015   Cervical spondylosis without myelopathy 02/06/2015   Obesity (BMI 30-39.9) 03/15/2013   BPH (benign prostatic hyperplasia) 11/15/2012   Type 2 diabetes mellitus with diabetic polyneuropathy, without long-term current use of insulin (HCC) 12/22/2010   Chronic pain of both knees 12/08/2008   Hyperlipidemia associated with type 2 diabetes mellitus (HCC), on statin 08/23/2006   Gout 08/23/2006   Obstructive sleep apnea 08/23/2006   Allergic rhinitis 08/23/2006   Chronic low back pain 08/23/2006   Seizure disorder (HCC) 08/23/2006   History of CVA (cerebrovascular accident) 08/23/2006   Joellyn Rued MS, PT 08/21/20 2:31 PM   [facility] [address] [place],  [place], [address] Phone: [phone]   Fax:  [phone]  Name: [name] MRN: [id number] Date of Birth: Jun 08, 1970

## 2020-08-25 ENCOUNTER — Encounter (HOSPITAL_BASED_OUTPATIENT_CLINIC_OR_DEPARTMENT_OTHER): Payer: Self-pay | Admitting: Physical Therapy

## 2020-08-25 ENCOUNTER — Ambulatory Visit (HOSPITAL_BASED_OUTPATIENT_CLINIC_OR_DEPARTMENT_OTHER): Payer: No Typology Code available for payment source | Admitting: Physical Therapy

## 2020-08-25 ENCOUNTER — Other Ambulatory Visit: Payer: Self-pay

## 2020-08-25 DIAGNOSIS — R262 Difficulty in walking, not elsewhere classified: Secondary | ICD-10-CM

## 2020-08-25 DIAGNOSIS — M5442 Lumbago with sciatica, left side: Secondary | ICD-10-CM

## 2020-08-25 DIAGNOSIS — G8929 Other chronic pain: Secondary | ICD-10-CM

## 2020-08-25 DIAGNOSIS — M5386 Other specified dorsopathies, lumbar region: Secondary | ICD-10-CM

## 2020-08-25 DIAGNOSIS — M4310 Spondylolisthesis, site unspecified: Secondary | ICD-10-CM

## 2020-08-25 NOTE — Therapy (Signed)
[facility] [address] [place], [place], [address] Phone: [phone]   Fax:  [phone]  Physical Therapy Treatment  Patient Details  Name: [name] MRN: [id number] Date of Birth: 03-19-1970 Referring Provider (PT): Julio Sicks, MD   Encounter Date: 08/25/2020   PT End of Session - 08/25/20 1003     Visit Number 10    Number of Visits 16    Date for PT Re-Evaluation 09/12/20    Authorization Time Period 03/09/20-03/09/21. Re-assess FOTO on 10th visits    PT Start Time 0945    PT Stop Time 1030    PT Time Calculation (min) 45 min    Equipment Utilized During Treatment Other (comment)    Activity Tolerance Patient tolerated treatment well;Patient limited by pain    Behavior During Therapy WFL for tasks assessed/performed             Past Medical History:  Diagnosis Date   Adrenal abnormality (HCC)    Allergy    Anxiety    Arthritis    Atypical chest pain    BPH (benign prostatic hypertrophy)    Complication of anesthesia    Hard time waking up after neck surgery   Depression    DM (diabetes mellitus) (HCC)    type II   GERD (gastroesophageal reflux disease)    "not since Gastric Bypass"   Gout    H/O: CVA (cardiovascular accident)    Hyperlipidemia    Knee pain, bilateral    LBP (low back pain)    OSA (obstructive sleep apnea)    Plantar fasciitis    PTSD (post-traumatic stress disorder)    Seizure disorder (HCC)    Stroke (HCC) 2001   some memory loss- math , science     Past Surgical History:  Procedure Laterality Date   CARPAL TUNNEL RELEASE Bilateral    COLONOSCOPY     ENDOSCOPIC PLANTAR FASCIOTOMY     ESOPHAGOGASTRODUODENOSCOPY     GASTRIC ROUX-EN-Y N/A 12/03/2019   Procedure: LAPAROSCOPIC ROUX-EN-Y GASTRIC BYPASS WITH UPPER ENDOSCOPY;  Surgeon: Luretha Murphy, MD;  Location: [facility];  Service: General;  Laterality: N/A;   HEEL SPUR SURGERY Left    LUMBAR LAMINECTOMY/DECOMPRESSION  MICRODISCECTOMY  2022   Dr. Jordan Likes   NECK SURGERY     Titanium Plate in [place]   SHOULDER SURGERY Right    TRIGGER FINGER RELEASE Right 05/14/2015   Procedure: RIGHT THUMB TRIGGER RELEASE ;  Surgeon: Betha Loa, MD;  Location: [facility];  Service: Orthopedics;  Laterality: Right;    There were no vitals filed for this visit.   Subjective Assessment - 08/25/20 1006     Subjective Pt reports increase in discomfort from last aquatic session.   9/10 that evening.  "Normal pain ~8/10" at night    Limitations Sitting;Walking;Standing;Lifting    Currently in Pain? Yes    Pain Score 6     Pain Location Back    Pain Orientation Posterior;Lower    Pain Descriptors / Indicators Aching;Sharp    Pain Type Chronic pain    Pain Radiating Towards to left knee    Pain Onset More than a month ago    Pain Frequency Constant                                         PT Short Term Goals - 08/07/20 2409  PT SHORT TERM GOAL #1   Title Pt will be ind in an initial HEP    Baseline started on eval    Status Achieved    Target Date 08/06/20      PT SHORT TERM GOAL #2   Title Ptwill voice understanding of measures to assist in the management and reduction of pain    Status On-going    Target Date 08/06/20               PT Long Term Goals - 08/15/20 0656       Additional Long Term Goals   Additional Long Term Goals Yes      PT LONG TERM GOAL #7   Title Improve 5xSTS time to less than 16 sec as indication of improved function.    Baseline 21.3 sec    Status New    Target Date 09/12/20            Pt seen for aquatic therapy today.  Treatment took place in water 3.25-4.8 ft in depth at the [facility] pool. Temp of water was 93.  Pt entered/exited the pool via stairs step to pattern independently with bilat rail.   Pt introduced to aquatic setting all depths with instruction on buoyancy Warm up: forward, backward and  side stepping/walking cues for increased step length, increased speed, hand placement to increase resistance.   Seated Stretching: gastroc, hamstrings, adductor and glutes 3 x 20-30 sec Stabilized by PT LB stretch knees to chest 3 x 20-30 sec hold with gentle overpressure.  Standing Foam cuffs on ankles: add/abd, hip flex, hip circles, hip extension 2 x 10 reps cuing for increased speed for added resistance. Instruction and cuing on core tightening while completing for improved stability and balance Hip flex stretch 3 x 20 sec hold.  Pt with discomfort in LB. Verbal and tactile cuing for positioning, decreasing stretch/minimizing pain.    Suspended in supine PPT 2 x 5 tactile assist  flutter kicking of LE x 5 mins      Pt completes 2-4 widths of pool after every 1-2 exercises to stretch and rest.   Pt ends session in Jacuzzi massaging LB and glute area (non billed)   Pt requires buoyancy for support and to offload joints with strengthening exercises. Viscosity of the water is needed for resistance of strengthening; water current perturbations provides challenge to standing balance unsupported, requiring increased core activation.       Plan - 08/25/20 1238     Clinical Impression Statement Decreased intensity of session today due to reports of spike in pain after last.  Focused on continued stretching and strengtheing gaining good results from supine suspended activity as pt tolerates this position the best. Pt stays after session and continues to progress stretching and work on cardio as tolerated.    Personal Factors and Comorbidities Time since onset of injury/illness/exacerbation;Comorbidity 1;Comorbidity 2;Comorbidity 3+    Stability/Clinical Decision Making Evolving/Moderate complexity    Clinical Decision Making Moderate    Rehab Potential Good    PT Frequency 2x / week    PT Treatment/Interventions ADLs/Self Care Home Management;Aquatic Therapy;Electrical  Stimulation;Iontophoresis 4mg /ml Dexamethasone;Moist Heat;Therapeutic exercise;Therapeutic activities;Functional mobility training;Stair training;Gait training;Patient/family education;Manual techniques;Spinal Manipulations;Taping    PT Home Exercise Plan [id number]  Pt does have access to a pool.  Will add to HEP going forward. 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 12/03/2019   Vitamin B 12 deficiency 12/09/2017   Chronic post-traumatic stress disorder (PTSD) after military combat 06/03/2017   Major depression, recurrent, chronic (HCC) 01/01/2017   Chronic pain syndrome 09/03/2016   Insomnia 07/15/2015   Cervical spondylosis without myelopathy 02/06/2015   Obesity (BMI 30-39.9) 03/15/2013   BPH (benign prostatic hyperplasia) 11/15/2012   Type 2 diabetes mellitus with diabetic polyneuropathy, without long-term current use of insulin (HCC) 12/22/2010   Chronic pain of both knees 12/08/2008   Hyperlipidemia associated with type 2 diabetes mellitus (HCC), on statin 08/23/2006   Gout 08/23/2006   Obstructive sleep apnea 08/23/2006   Allergic rhinitis 08/23/2006   Chronic low back pain 08/23/2006   Seizure disorder (HCC) 08/23/2006   History of CVA (cerebrovascular accident) 08/23/2006    Jeanmarie Hubert 08/25/2020, 1:00 PM  [facility] [address] [place], [place],  [address] Phone: [phone]   Fax:  [phone]  Name: [name] MRN: [id number] Date of Birth: [birth year]

## 2020-08-27 DIAGNOSIS — Z20822 Contact with and (suspected) exposure to covid-19: Secondary | ICD-10-CM | POA: Diagnosis not present

## 2020-08-28 ENCOUNTER — Ambulatory Visit: Payer: No Typology Code available for payment source

## 2020-08-28 ENCOUNTER — Encounter: Payer: Self-pay | Admitting: Family Medicine

## 2020-08-29 ENCOUNTER — Encounter (HOSPITAL_BASED_OUTPATIENT_CLINIC_OR_DEPARTMENT_OTHER): Payer: Self-pay | Admitting: Physical Therapy

## 2020-08-29 ENCOUNTER — Encounter: Payer: Self-pay | Admitting: Family Medicine

## 2020-08-31 ENCOUNTER — Ambulatory Visit: Payer: No Typology Code available for payment source

## 2020-09-01 DIAGNOSIS — Z20822 Contact with and (suspected) exposure to covid-19: Secondary | ICD-10-CM | POA: Diagnosis not present

## 2020-09-02 ENCOUNTER — Encounter (HOSPITAL_BASED_OUTPATIENT_CLINIC_OR_DEPARTMENT_OTHER): Payer: Self-pay

## 2020-09-02 ENCOUNTER — Ambulatory Visit (HOSPITAL_BASED_OUTPATIENT_CLINIC_OR_DEPARTMENT_OTHER): Payer: No Typology Code available for payment source | Admitting: Physical Therapy

## 2020-09-04 ENCOUNTER — Encounter (HOSPITAL_BASED_OUTPATIENT_CLINIC_OR_DEPARTMENT_OTHER): Payer: No Typology Code available for payment source | Admitting: Pulmonary Disease

## 2020-09-04 ENCOUNTER — Ambulatory Visit: Payer: No Typology Code available for payment source | Admitting: Family Medicine

## 2020-09-08 ENCOUNTER — Other Ambulatory Visit: Payer: Self-pay

## 2020-09-08 ENCOUNTER — Encounter (HOSPITAL_BASED_OUTPATIENT_CLINIC_OR_DEPARTMENT_OTHER): Payer: Self-pay | Admitting: Physical Therapy

## 2020-09-08 ENCOUNTER — Ambulatory Visit (HOSPITAL_BASED_OUTPATIENT_CLINIC_OR_DEPARTMENT_OTHER): Payer: No Typology Code available for payment source | Attending: Neurosurgery | Admitting: Physical Therapy

## 2020-09-08 DIAGNOSIS — M4310 Spondylolisthesis, site unspecified: Secondary | ICD-10-CM | POA: Insufficient documentation

## 2020-09-08 DIAGNOSIS — M5442 Lumbago with sciatica, left side: Secondary | ICD-10-CM | POA: Diagnosis present

## 2020-09-08 DIAGNOSIS — G8929 Other chronic pain: Secondary | ICD-10-CM

## 2020-09-08 DIAGNOSIS — M5386 Other specified dorsopathies, lumbar region: Secondary | ICD-10-CM | POA: Diagnosis present

## 2020-09-08 DIAGNOSIS — R262 Difficulty in walking, not elsewhere classified: Secondary | ICD-10-CM | POA: Diagnosis present

## 2020-09-08 NOTE — Therapy (Signed)
[facility] [address] [place], [place], [address] Phone: [phone]   Fax:  [phone]  Physical Therapy Treatment  Patient Details  Name: [name] MRN: [id number] Date of Birth: December 16, 1970 Referring Provider (PT): Julio Sicks, MD   Encounter Date: 09/08/2020   PT End of Session - 09/08/20 1042     Visit Number 11    PT Start Time 1037    PT Stop Time 1115    PT Time Calculation (min) 38 min             Past Medical History:  Diagnosis Date   Adrenal abnormality (HCC)    Allergy    Anxiety    Arthritis    Atypical chest pain    BPH (benign prostatic hypertrophy)    Complication of anesthesia    Hard time waking up after neck surgery   Depression    DM (diabetes mellitus) (HCC)    type II   GERD (gastroesophageal reflux disease)    "not since Gastric Bypass"   Gout    H/O: CVA (cardiovascular accident)    Hyperlipidemia    Knee pain, bilateral    LBP (low back pain)    OSA (obstructive sleep apnea)    Plantar fasciitis    PTSD (post-traumatic stress disorder)    Seizure disorder (HCC)    Stroke (HCC) 2001   some memory loss- math , science     Past Surgical History:  Procedure Laterality Date   CARPAL TUNNEL RELEASE Bilateral    COLONOSCOPY     ENDOSCOPIC PLANTAR FASCIOTOMY     ESOPHAGOGASTRODUODENOSCOPY     GASTRIC ROUX-EN-Y N/A 12/03/2019   Procedure: LAPAROSCOPIC ROUX-EN-Y GASTRIC BYPASS WITH UPPER ENDOSCOPY;  Surgeon: Luretha Murphy, MD;  Location: [facility];  Service: General;  Laterality: N/A;   HEEL SPUR SURGERY Left    LUMBAR LAMINECTOMY/DECOMPRESSION MICRODISCECTOMY  2022   Dr. Jordan Likes   NECK SURGERY     Titanium Plate in [place]   SHOULDER SURGERY Right    TRIGGER FINGER RELEASE Right 05/14/2015   Procedure: RIGHT THUMB TRIGGER RELEASE ;  Surgeon: Betha Loa, MD;  Location: [facility];  Service: Orthopedics;  Laterality: Right;    There were no vitals filed  for this visit.   Subjective Assessment - 09/08/20 1222     Subjective Rode in car to and from [facility].  Increased oveall discomofrt    Limitations Sitting;Walking;Standing;Lifting    Currently in Pain? Yes    Pain Score 8     Pain Location Back    Pain Orientation Lower    Pain Descriptors / Indicators Aching;Sharp    Pain Type Chronic pain    Pain Onset More than a month ago    Pain Frequency Constant    Aggravating Factors  prolonged sitting                                       PT Education - 09/08/20 1224     Education Details Importance and purpose of contonuing with HEP              PT Short Term Goals - 08/07/20 0935       PT SHORT TERM GOAL #1   Title Pt will be ind in an initial HEP    Baseline started on eval    Status Achieved    Target  Date 08/06/20      PT SHORT TERM GOAL #2   Title Ptwill voice understanding of measures to assist in the management and reduction of pain    Status On-going    Target Date 08/06/20               PT Long Term Goals - 08/15/20 0656       Additional Long Term Goals   Additional Long Term Goals Yes      PT LONG TERM GOAL #7   Title Improve 5xSTS time to less than 16 sec as indication of improved function.    Baseline 21.3 sec    Status New    Target Date 09/12/20            Pt seen for aquatic therapy today.  Treatment took place in water 3.25-4.8 ft in depth at the [facility] pool. Temp of water was 93.  Pt entered/exited the pool via stairs step to pattern independently with bilat rail.   Warm up: forward, backward and side stepping/walking cues for increased step length, increased speed, hand placement to increase resistance x 4-6 lengths of pool   Seated Stretching: gastroc, hamstrings, adductor and glutes 3 x 20-30 sec Stabilized by PT LB stretch knees to chest 3 x 20-30 sec hold with gentle overpressure.   Standing Yellow noodle kick downs 2 x 15  rle, blue squoodle lle 2 x 15.  Cueing for tight core    Suspended in supine flutter kicking  4 x 20 reps  Vertically suspended Sitting and straddling noodle: interval for cardio and strengthening bicyclicing and scissoring 20 quick revolutions then 20 slow x 7 mins.  Pt instruction on positioning an d core stabilization throughout LB/core gentle rotation x 20     Pt completes 2-4 widths of pool after every 2 exercises to stretch and rest.   Pt ends session in Jacuzzi massaging LB and glute area (non billed)   Pt requires buoyancy for support and to offload joints with strengthening exercises. Viscosity of the water is needed for resistance of strengthening; water current perturbations provides challenge to standing balance unsupported, requiring increased core activation.       Plan - 09/08/20 1101     Clinical Impression Statement Less resistance today tolerated lle vs right when completing activities due to pain. Increased limitation in therapy. Pt encouraged to work into discomofrt not pain.  He is to travel again this w/e.  He is instructed to get up and walk frequently.  MD apoint scheduled for later this week.    Personal Factors and Comorbidities Time since onset of injury/illness/exacerbation;Comorbidity 1;Comorbidity 2;Comorbidity 3+    Examination-Activity Limitations Lift;Squat;Stand;Locomotion Level;Transfers    Examination-Participation Restrictions Other    Stability/Clinical Decision Making Evolving/Moderate complexity    Clinical Decision Making Moderate    Rehab Potential Good    PT Frequency 2x / week    PT Treatment/Interventions ADLs/Self Care Home Management;Aquatic Therapy;Electrical Stimulation;Iontophoresis 4mg /ml Dexamethasone;Moist Heat;Therapeutic exercise;Therapeutic activities;Functional mobility training;Stair training;Gait training;Patient/family education;Manual techniques;Spinal Manipulations;Taping    PT Next Visit Plan Assess response to HEP. KT tape  for paraspinal inhibtion as needed. Progress ther ex as indicated. Work on improving hip flexor/psoas extensibility, and hip extension (consider leaning on counter + hip extension?).    PT Home Exercise Plan [id number]  Pt does have access to a pool.  Will add to HEP going forward. Assess response to iontophoresis    Consulted and Agree with Plan of Care Patient  Patient will benefit from skilled therapeutic intervention in order to improve the following deficits and impairments:  Decreased range of motion, Difficulty walking, Decreased activity tolerance, Pain, Decreased mobility, Decreased strength  Visit Diagnosis: Spondylolisthesis, unspecified spinal region  Chronic bilateral low back pain with left-sided sciatica  Decreased ROM of lumbar spine  Difficulty in walking, not elsewhere classified     Problem List Patient Active Problem List   Diagnosis Date Noted   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 12/03/2019   Vitamin B 12 deficiency 12/09/2017   Chronic post-traumatic stress disorder (PTSD) after military combat 06/03/2017   Major depression, recurrent, chronic (HCC) 01/01/2017   Chronic pain syndrome 09/03/2016   Insomnia 07/15/2015   Cervical spondylosis without myelopathy 02/06/2015   Obesity (BMI 30-39.9) 03/15/2013   BPH (benign prostatic hyperplasia) 11/15/2012   Type 2 diabetes mellitus with diabetic polyneuropathy, without long-term current use of insulin (HCC) 12/22/2010   Chronic pain of both knees 12/08/2008   Hyperlipidemia associated with type 2 diabetes mellitus (HCC), on statin 08/23/2006   Gout 08/23/2006   Obstructive sleep apnea 08/23/2006   Allergic rhinitis 08/23/2006   Chronic low back pain 08/23/2006   Seizure disorder (HCC) 08/23/2006   History of CVA (cerebrovascular accident) 08/23/2006    Jeanmarie Hubert  MPT 09/08/2020, 12:35 PM  [facility] [address] [place], [place],  [address] Phone: [phone]   Fax:  [phone]  Name: RONAL Hodge MRN: [id number] Date of Birth: 09/05/1970

## 2020-09-09 ENCOUNTER — Ambulatory Visit: Payer: No Typology Code available for payment source | Attending: Neurosurgery

## 2020-09-09 DIAGNOSIS — R262 Difficulty in walking, not elsewhere classified: Secondary | ICD-10-CM | POA: Diagnosis present

## 2020-09-09 DIAGNOSIS — G8929 Other chronic pain: Secondary | ICD-10-CM | POA: Diagnosis present

## 2020-09-09 DIAGNOSIS — M5442 Lumbago with sciatica, left side: Secondary | ICD-10-CM | POA: Diagnosis present

## 2020-09-09 DIAGNOSIS — M5386 Other specified dorsopathies, lumbar region: Secondary | ICD-10-CM | POA: Insufficient documentation

## 2020-09-09 DIAGNOSIS — M4310 Spondylolisthesis, site unspecified: Secondary | ICD-10-CM | POA: Insufficient documentation

## 2020-09-09 NOTE — Therapy (Signed)
[place], [place], [address] Phone: [phone]   Fax:  [phone]  Physical Therapy Treatment/Re-Auth  Patient Details  Name: AASIM RESTIVO MRN: [id number] Date of Birth: 07/09/1970 Referring Provider (PT): Earnie Larsson, MD   Encounter Date: 09/09/2020   PT End of Session - 09/09/20 0857     Visit Number 12    Number of Visits 16    Date for PT Re-Evaluation 09/12/20    Authorization Type [facility] [employer] [facility] [facility]    Authorization Time Period 03/09/20-03/09/21. Re-assess FOTO on 10th visits    Authorization - Visit Number 12    Authorization - Number of Visits 15    Progress Note Due on Visit 10    PT Start Time 0258    PT Stop Time 0935    PT Time Calculation (min) 40 min    Equipment Utilized During Treatment Other (comment)    Activity Tolerance Patient tolerated treatment well;Patient limited by pain    Behavior During Therapy WFL for tasks assessed/performed             Past Medical History:  Diagnosis Date   Adrenal abnormality (HCC)    Allergy    Anxiety    Arthritis    Atypical chest pain    BPH (benign prostatic hypertrophy)    Complication of anesthesia    Hard time waking up after neck surgery   Depression    DM (diabetes mellitus) ([place])    type II   GERD (gastroesophageal reflux disease)    "not since Gastric Bypass"   Gout    H/O: CVA (cardiovascular accident)    Hyperlipidemia    Knee pain, bilateral    LBP (low back pain)    OSA (obstructive sleep apnea)    Plantar fasciitis    PTSD (post-traumatic stress disorder)    Seizure disorder ([place])    Stroke ([place]) 2001   some memory loss- math , science     Past Surgical History:  Procedure Laterality Date   CARPAL TUNNEL RELEASE Bilateral    COLONOSCOPY     ENDOSCOPIC PLANTAR FASCIOTOMY     ESOPHAGOGASTRODUODENOSCOPY     GASTRIC ROUX-EN-Y N/A 12/03/2019   Procedure: LAPAROSCOPIC ROUX-EN-Y GASTRIC BYPASS WITH UPPER  ENDOSCOPY;  Surgeon: Johnathan [place], MD;  Location: [facility];  Service: General;  Laterality: N/A;   HEEL SPUR SURGERY Left    LUMBAR LAMINECTOMY/DECOMPRESSION MICRODISCECTOMY  2022   Dr. Annette Stable   NECK SURGERY     Titanium Plate in Vertebrae   SHOULDER SURGERY Right    TRIGGER FINGER RELEASE Right 05/14/2015   Procedure: RIGHT THUMB TRIGGER RELEASE ;  Surgeon: Leanora Cover, MD;  Location: [facility];  Service: Orthopedics;  Laterality: Right;    There were no vitals filed for this visit.   Subjective Assessment - 09/09/20 0859     Subjective My back pain is tolerable,  a normal day for me. He notes he is to fly to [facility] this weekend. Pt feels like his ROM is improving, although the L low back is a struggle. Has an appt c Dr. Trenton Gammon tomorrow.    Diagnostic tests 04/03/20 lumbar xray:    FINDINGS:  AP and lateral view intraoperative fluoroscopic images of the  lumbosacral spine are submitted, 2 images total. The images  demonstrate bilateral pedicle screws at the L5 and S1 levels.  Vertical interconnecting rods were not present at the time the  images were taken. An L5-S1  interbody device is also present.  Overlying retractors.    Patient Stated Goals Pt states wanting to be able to sleep through the night and enjoy grandkids    Currently in Pain? Yes    Pain Score 7     Pain Location Back    Pain Orientation Right;Left;Posterior    Pain Descriptors / Indicators Aching;Sharp    Pain Type Chronic pain    Pain Radiating Towards to left knee    Pain Onset More than a month ago    Pain Frequency Constant                [facility] PT Assessment - 09/09/20 0001       AROM   Lumbar Flexion hands to distal patells    Lumbar Extension 25% limited    Lumbar - Right Side Bend hand to knee joint line    Lumbar - Left Side Bend hand to 2" above the prox patella    Lumbar - Right Rotation 25% limited    Lumbar - Left Rotation 505 limited                            [facility] Adult PT Treatment/Exercise - 09/09/20 0001       Exercises   Exercises Lumbar;Knee/Hip      Lumbar Exercises: Stretches   Single Knee to Chest Stretch Right;Left;2 reps;20 seconds    Lower Trunk Rotation 5 reps;20 seconds    Piriformis Stretch Right;Left;2 reps;30 seconds      Lumbar Exercises: Aerobic   Nustep 7 mins; L6; UEs/LEs   completes at a good pace     Lumbar Exercises: Supine   Pelvic Tilt 10 reps   3 sec                     PT Short Term Goals - 09/09/20 1325       PT SHORT TERM GOAL #2   Title Ptwill voice understanding of measures to assist in the management and reduction of pain. 09/09/20, pt uses multiple techniques to help manage his pain: heat, cold, TENS, masssage gun tennis ball massase    Status Achieved    Target Date 09/09/20               PT Long Term Goals - 09/09/20 1327       PT LONG TERM GOAL #1   Title Increase trunk AROMs by at least 25% to improve pt's ability to complete functional activities. 09/09/20-Pt trunks ROMs have increased min to mod except L side bending which is the same    Baseline See flowsheets    Status Partially Met    Target Date 10/14/20      PT LONG TERM GOAL #2   Title Increase bilat knee and hip strength to at least 4+/5 strength for improved function ability. 09/09/20- R knee and hip have increased to 5/5 and 4+/5 respecively. L knee strength has increased to 4+/5 and the L hip is 4/5, improved but lss than set goal. L knee and hip strengths are impacted by pain.    Status Partially Met    Target Date 10/14/20      PT LONG TERM GOAL #3   Title Pt will report 50% improvement in his ability to sleep. 09/09/20- pt reports 25% improvement in his sleep.    Status Partially Met    Target Date 10/14/20      PT LONG  TERM GOAL #4   Title Pt will report a decrease in his pain range with ADL to 4-7/10 for improved QOL. 09/09/20- 7-10/10 pain rating    Baseline 7-10/10    Status On-going    Target Date  10/14/20      PT LONG TERM GOAL #5   Title Pt's FOTO score will increase to he predicted value of 51% functional ability. 08/07/20- 45%    Baseline 37%    Status On-going    Target Date 10/14/20      PT LONG TERM GOAL #6   Title Pt will be Ind in a final HEP to maintain or progress achieved LOF    Status On-going    Target Date 10/14/20      PT LONG TERM GOAL #7   Title Improve 5xSTS time to less than 16 sec as indication of improved function.    Baseline 21.3 sec    Status On-going    Target Date 10/14/20                   Plan - 09/09/20 1324     Clinical Impression Statement Re-assessment of pt's goals revealed improved tunk mobility except for L side bending and improved LE strength R>L. Pt reports 25% improvement in sleep quality and an improved walking tolerance. Pt reports his pain level is the same with a pain range of 7-10/10. See goals below. Pt has participated in 3 aquatics sessions which he found to be an effective way to exercise and has complete some exercising on his own at a pool. Pt has  4 more visits approved from the [place]. Recommend the continuation of PT 1w4 to continue addressing ROM, strength, and pain to optimize his functional abilities.    Personal Factors and Comorbidities Time since onset of injury/illness/exacerbation;Comorbidity 1;Comorbidity 2;Comorbidity 3+;Finances    Comorbidities see medical Hx    Examination-Activity Limitations Lift;Squat;Stand;Locomotion Level;Transfers    Examination-Participation Restrictions Other    Stability/Clinical Decision Making Evolving/Moderate complexity    Clinical Decision Making Moderate    Rehab Potential Good    PT Frequency 2x / week    PT Duration 8 weeks    PT Treatment/Interventions ADLs/Self Care Home Management;Aquatic Therapy;Electrical Stimulation;Iontophoresis 39m/ml Dexamethasone;Moist Heat;Therapeutic exercise;Therapeutic activities;Functional mobility training;Stair training;Gait  training;Patient/family education;Manual techniques;Spinal Manipulations;Taping    PT Next Visit Plan Assess response to HEP. KT tape for paraspinal inhibtion as needed. Progress ther ex as indicated. Work on improving hip flexor/psoas extensibility, and hip extension (consider leaning on counter + hip extension?).    PT Home Exercise Plan [id number]  Pt does have access to a pool.  Will add to HEP going forward. Assess response to iontophoresis    Consulted and Agree with Plan of Care Patient             Patient will benefit from skilled therapeutic intervention in order to improve the following deficits and impairments:  Decreased range of motion, Difficulty walking, Decreased activity tolerance, Pain, Decreased mobility, Decreased strength  Visit Diagnosis: Spondylolisthesis, unspecified spinal region  Chronic bilateral low back pain with left-sided sciatica  Decreased ROM of lumbar spine  Difficulty in walking, not elsewhere classified     Problem List Patient Active Problem List   Diagnosis Date Noted   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 12/03/2019   Vitamin B 12 deficiency 12/09/2017   Chronic post-traumatic stress disorder (PTSD) [place] 06/03[place]   Major depression, recurrent, chronic ([place] 01/01/2017   Chronic pain  syndrome 09/03/2016   Insomnia 07/15/2015   Cervical spondylosis without myelopathy 02/06/2015   Obesity (BMI 30-39.9) 03/15/2013   BPH (benign prostatic hyperplasia) 11/15/2012   Type 2 diabetes mellitus with diabetic polyneuropathy, without long-term current use of insulin ([place]) 12/22/2010   Chronic pain of both knees 12/08/2008   Hyperlipidemia associated with type 2 diabetes mellitus ([place]), on statin 08/23/2006   Gout 08/23/2006   Obstructive sleep apnea 08/23/2006   Allergic rhinitis 08/23/2006   Chronic low back pain 08/23/2006   Seizure disorder ([place]) 08/23/2006   History of CVA (cerebrovascular accident) 08/23/2006   Gar Ponto [place], PT 09/09/20 9:54 PM   [facility] [address] [place], [place], [address] Phone: [phone]   Fax:  [phone]  Name: DUKE WEISENSEL MRN: [id number] Date of Birth: 1970-11-20

## 2020-09-16 ENCOUNTER — Ambulatory Visit: Payer: No Typology Code available for payment source

## 2020-09-16 ENCOUNTER — Other Ambulatory Visit: Payer: Self-pay

## 2020-09-16 DIAGNOSIS — G8929 Other chronic pain: Secondary | ICD-10-CM

## 2020-09-16 DIAGNOSIS — M5386 Other specified dorsopathies, lumbar region: Secondary | ICD-10-CM

## 2020-09-16 DIAGNOSIS — M4310 Spondylolisthesis, site unspecified: Secondary | ICD-10-CM

## 2020-09-16 DIAGNOSIS — R262 Difficulty in walking, not elsewhere classified: Secondary | ICD-10-CM

## 2020-09-16 NOTE — Therapy (Signed)
[place], [place], [address] Phone: [phone]   Fax:  [phone]  Physical Therapy Treatment  Patient Details  Name: [name] MRN: [id number] Date of Birth: 10/02/1970 Referring Provider (PT): Earnie Larsson, MD   Encounter Date: 09/16/2020   PT End of Session - 09/16/20 0921     Visit Number 13    Number of Visits 16    Date for PT Re-Evaluation 09/12/20    Authorization Type [facility] [employer]    Authorization Time Period 03/09/20-03/09/21. Re-assess FOTO on 16th visits    Authorization - Visit Number 13    Authorization - Number of Visits 15    Progress Note Due on Visit 16    PT Start Time 0851    PT Stop Time 0946    PT Time Calculation (min) 55 min    Equipment Utilized During Treatment Other (comment)    Activity Tolerance Patient tolerated treatment well;Patient limited by pain    Behavior During Therapy WFL for tasks assessed/performed             Past Medical History:  Diagnosis Date   Adrenal abnormality (HCC)    Allergy    Anxiety    Arthritis    Atypical chest pain    BPH (benign prostatic hypertrophy)    Complication of anesthesia    Hard time waking up after neck surgery   Depression    DM (diabetes mellitus) ([place])    type II   GERD (gastroesophageal reflux disease)    "not since Gastric Bypass"   Gout    H/O: CVA (cardiovascular accident)    Hyperlipidemia    Knee pain, bilateral    LBP (low back pain)    OSA (obstructive sleep apnea)    Plantar fasciitis    PTSD (post-traumatic stress disorder)    Seizure disorder ([place])    Stroke ([place]) 2001   some memory loss- math , science     Past Surgical History:  Procedure Laterality Date   CARPAL TUNNEL RELEASE Bilateral    COLONOSCOPY     ENDOSCOPIC PLANTAR FASCIOTOMY     ESOPHAGOGASTRODUODENOSCOPY     GASTRIC ROUX-EN-Y N/A 12/03/2019   Procedure: LAPAROSCOPIC ROUX-EN-Y GASTRIC BYPASS WITH UPPER  ENDOSCOPY;  Surgeon: Johnathan [place], MD;  Location: [facility];  Service: General;  Laterality: N/A;   HEEL SPUR SURGERY Left    LUMBAR LAMINECTOMY/DECOMPRESSION MICRODISCECTOMY  2022   Dr. Annette Stable   NECK SURGERY     Titanium Plate in Vertebrae   SHOULDER SURGERY Right    TRIGGER FINGER RELEASE Right 05/14/2015   Procedure: RIGHT THUMB TRIGGER RELEASE ;  Surgeon: Leanora Cover, MD;  Location: [facility];  Service: Orthopedics;  Laterality: Right;    There were no vitals filed for this visit.   Subjective Assessment - 09/16/20 0855     Subjective Overall doing OK.    Diagnostic tests 04/03/20 lumbar xray:    FINDINGS:  AP and lateral view intraoperative fluoroscopic images of the  lumbosacral spine are submitted, 2 images total. The images  demonstrate bilateral pedicle screws at the L5 and S1 levels.  Vertical interconnecting rods were not present at the time the  images were taken. An L5-S1 interbody device is also present.  Overlying retractors.    Patient Stated Goals Pt states wanting to be able to sleep through the night and enjoy grandkids    Currently in Pain? Yes    Pain Score  7     Pain Location Back    Pain Orientation Right;Left;Posterior;Lower    Pain Descriptors / Indicators Aching;Sharp    Pain Type Chronic pain    Pain Onset More than a month ago    Pain Frequency Constant    Aggravating Factors  prolonged sitting, squating, stepping up    Pain Relieving Factors back brace, heat and cold, massage gun, topical analgesic creams                               [facility] Adult PT Treatment/Exercise - 09/16/20 0001       Lumbar Exercises: Stretches   Single Knee to Chest Stretch Right;Left;2 reps;20 seconds    Double Knee to Chest Stretch 2 reps;20 seconds    Lower Trunk Rotation 5 reps;20 seconds    Other Lumbar Stretch Exercise trunk flexion and laterally c green TBall, 6x 20 sec      Lumbar Exercises: Aerobic   Nustep 7 mins; L6; UEs/LEs    completes at a good pace     Lumbar Exercises: Standing   Functional Squats 10 reps   dowel   Functional Squats Limitations 10x c hinged hip c dowel, 10x then c 15 lb.      Lumbar Exercises: Supine   Pelvic Tilt 15 reps   3 sec     Modalities   Modalities Cryotherapy      Cryotherapy   Number Minutes Cryotherapy 15 Minutes    Cryotherapy Location Lumbar Spine    Type of Cryotherapy Ice pack                    PT Education - 09/16/20 0920     Education Details Hinged hip lifts    Person(s) Educated Patient    Methods Explanation;Demonstration;Tactile cues;Verbal cues    Comprehension Verbalized understanding;Returned demonstration;Verbal cues required;Tactile cues required              PT Short Term Goals - 09/09/20 1325       PT SHORT TERM GOAL #2   Title Ptwill voice understanding of measures to assist in the management and reduction of pain. 09/09/20, pt uses multiple techniques to help manage his pain: heat, cold, TENS, masssage gun tennis ball massase    Status Achieved    Target Date 09/09/20               PT Long Term Goals - 09/09/20 1327       PT LONG TERM GOAL #1   Title Increase trunk AROMs by at least 25% to improve pt's ability to complete functional activities. 09/09/20-Pt trunks ROMs have increased min to mod except L side bending which is the same    Baseline See flowsheets    Status Partially Met    Target Date 10/14/20      PT LONG TERM GOAL #2   Title Increase bilat knee and hip strength to at least 4+/5 strength for improved function ability. 09/09/20- R knee and hip have increased to 5/5 and 4+/5 respecively. L knee strength has increased to 4+/5 and the L hip is 4/5, improved but lss than set goal. L knee and hip strengths are impacted by pain.    Status Partially Met    Target Date 10/14/20      PT LONG TERM GOAL #3   Title Pt will report 50% improvement in his ability to sleep. 09/09/20- pt reports 25%  improvement in his  sleep.    Status Partially Met    Target Date 10/14/20      PT LONG TERM GOAL #4   Title Pt will report a decrease in his pain range with ADL to 4-7/10 for improved QOL. 09/09/20- 7-10/10 pain rating    Baseline 7-10/10    Status On-going    Target Date 10/14/20      PT LONG TERM GOAL #5   Title Pt's FOTO score will increase to he predicted value of 51% functional ability. 08/07/20- 45%    Baseline 37%    Status On-going    Target Date 10/14/20      PT LONG TERM GOAL #6   Title Pt will be Ind in a final HEP to maintain or progress achieved LOF    Status On-going    Target Date 10/14/20      PT LONG TERM GOAL #7   Title Improve 5xSTS time to less than 16 sec as indication of improved function.    Baseline 21.3 sec    Status On-going    Target Date 10/14/20                   Plan - 09/16/20 2131     Clinical Impression Statement PT provided education re: proper squat and hinge hip lifting. Pt warmed up c the Nustep and lumbopelvic/LE stretches. Pt then completed squats, unweighted hinged hip lift mechanics f/b hinged hip lifting of 15 lb kettleall from a height of 20". With the kettlebell lifts, pt reported an increase in L lumbar pain and muscle tightness.Pt then complete LTR, SKTC, DKTC and pelvic tilts which reduced the pain and tightness. A cold pack was then applied to the low back for 15 mins. Following the stretching and the cold pack, the pt reported his L low back was feeling better.    Personal Factors and Comorbidities Time since onset of injury/illness/exacerbation;Comorbidity 1;Comorbidity 2;Comorbidity 3+;Finances    Comorbidities see medical Hx    Examination-Activity Limitations Lift;Squat;Stand;Locomotion Level;Transfers    Stability/Clinical Decision Making Evolving/Moderate complexity    Clinical Decision Making Moderate    Rehab Potential Good    PT Frequency 2x / week    PT Duration 8 weeks    PT Treatment/Interventions ADLs/Self Care Home  Management;Aquatic Therapy;Electrical Stimulation;Iontophoresis 7m/ml Dexamethasone;Moist Heat;Therapeutic exercise;Therapeutic activities;Functional mobility training;Stair training;Gait training;Patient/family education;Manual techniques;Spinal Manipulations;Taping    PT Next Visit Plan Assess response to HEP. KT tape for paraspinal inhibtion as needed. Progress ther ex as indicated. Work on improving hip flexor/psoas extensibility, and hip extension (consider leaning on counter + hip extension?).    PT Home Exercise Plan [id number]  Pt does have access to a pool.  Will add to HEP going forward. Progress back strengthening as tolerated    Consulted and Agree with Plan of Care Patient             Patient will benefit from skilled therapeutic intervention in order to improve the following deficits and impairments:  Decreased range of motion, Difficulty walking, Decreased activity tolerance, Pain, Decreased mobility, Decreased strength  Visit Diagnosis: Spondylolisthesis, unspecified spinal region  Chronic bilateral low back pain with left-sided sciatica  Decreased ROM of lumbar spine  Difficulty in walking, not elsewhere classified     Problem List Patient Active Problem List   Diagnosis Date Noted   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 12/03/2019   Vitamin B 12 deficiency 12/09/2017   Chronic post-traumatic stress disorder (PTSD) 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 06/03/2017   Major depression, recurrent, chronic ([place]) 01/01/2017   Chronic pain syndrome 09/03/2016   Insomnia 07/15/2015   Cervical spondylosis without myelopathy 02/06/2015   Obesity (BMI 30-39.9) 03/15/2013   BPH (benign prostatic hyperplasia) 11/15/2012   Type 2 diabetes mellitus with diabetic polyneuropathy, without long-term current use of insulin ([place]) 12/22/2010   Chronic pain of both knees 12/08/2008   Hyperlipidemia associated with type 2 diabetes mellitus ([place]), on statin 08/23/2006   Gout 08/23/2006   Obstructive  sleep apnea 08/23/2006   Allergic rhinitis 08/23/2006   Chronic low back pain 08/23/2006   Seizure disorder ([place]) 08/23/2006   History of CVA (cerebrovascular accident) 08/23/2006    Gar Ponto MS, PT 09/16/20 9:44 PM   [place] [facility] [address] [place], [place], [address] Phone: [phone]   Fax:  [phone]  Name: DOYEL MULKERN MRN: [id number] Date of Birth: 09-24-1970

## 2020-09-20 ENCOUNTER — Ambulatory Visit (HOSPITAL_BASED_OUTPATIENT_CLINIC_OR_DEPARTMENT_OTHER): Payer: BC Managed Care – PPO | Attending: Pulmonary Disease | Admitting: Pulmonary Disease

## 2020-09-20 DIAGNOSIS — G4733 Obstructive sleep apnea (adult) (pediatric): Secondary | ICD-10-CM | POA: Diagnosis not present

## 2020-09-23 ENCOUNTER — Ambulatory Visit: Payer: No Typology Code available for payment source | Admitting: Family Medicine

## 2020-09-23 ENCOUNTER — Ambulatory Visit: Payer: No Typology Code available for payment source

## 2020-09-23 ENCOUNTER — Other Ambulatory Visit: Payer: Self-pay

## 2020-09-23 DIAGNOSIS — R262 Difficulty in walking, not elsewhere classified: Secondary | ICD-10-CM

## 2020-09-23 DIAGNOSIS — G8929 Other chronic pain: Secondary | ICD-10-CM

## 2020-09-23 DIAGNOSIS — M4310 Spondylolisthesis, site unspecified: Secondary | ICD-10-CM | POA: Diagnosis not present

## 2020-09-23 DIAGNOSIS — M5442 Lumbago with sciatica, left side: Secondary | ICD-10-CM

## 2020-09-23 DIAGNOSIS — M5386 Other specified dorsopathies, lumbar region: Secondary | ICD-10-CM

## 2020-09-23 NOTE — Therapy (Signed)
[place], [place], [address] Phone: [phone]   Fax:  [phone]  Physical Therapy Treatment  Patient Details  Name: [name] MRN: [id number] Date of Birth: 29-Sep-1970 Referring Provider (PT): Earnie Larsson, MD   Encounter Date: 09/23/2020   PT End of Session - 09/23/20 0850     Visit Number 14    Number of Visits 16    Date for PT Re-Evaluation 09/12/20    Authorization Type [facility] [place] Time Period 03/09/20-03/09/21. Re-assess FOTO on 16th visits    Authorization - Visit Number 28    Authorization - Number of Visits 16    Progress Note Due on Visit 16    PT Start Time 0850    PT Stop Time 0933    PT Time Calculation (min) 43 min    Equipment Utilized During Treatment Other (comment)    Activity Tolerance Patient tolerated treatment well;Patient limited by pain    Behavior During Therapy WFL for tasks assessed/performed             Past Medical History:  Diagnosis Date   Adrenal abnormality (HCC)    Allergy    Anxiety    Arthritis    Atypical chest pain    BPH (benign prostatic hypertrophy)    Complication of anesthesia    Hard time waking up after neck surgery   Depression    DM (diabetes mellitus) ([place])    type II   GERD (gastroesophageal reflux disease)    "not since Gastric Bypass"   Gout    H/O: CVA (cardiovascular accident)    Hyperlipidemia    Knee pain, bilateral    LBP (low back pain)    OSA (obstructive sleep apnea)    Plantar fasciitis    PTSD (post-traumatic stress disorder)    Seizure disorder ([place])    Stroke ([place]) 2001   some memory loss- math , science     Past Surgical History:  Procedure Laterality Date   CARPAL TUNNEL RELEASE Bilateral    COLONOSCOPY     ENDOSCOPIC PLANTAR FASCIOTOMY     ESOPHAGOGASTRODUODENOSCOPY     GASTRIC ROUX-EN-Y N/A 12/03/2019   Procedure: LAPAROSCOPIC ROUX-EN-Y GASTRIC BYPASS WITH UPPER  ENDOSCOPY;  Surgeon: Johnathan Hausen, MD;  Location: [facility];  Service: General;  Laterality: N/A;   HEEL SPUR SURGERY Left    LUMBAR LAMINECTOMY/DECOMPRESSION MICRODISCECTOMY  2022   Dr. Annette Stable   NECK SURGERY     Titanium Plate in [place]bra[place]   SHOULDER SURGERY Right    TRIGGER FINGER RELEASE Right 05/14/2015   Procedure: RIGHT THUMB TRIGGER RELEASE ;  Surgeon: Leanora Cover, MD;  Location: [facility];  Service: Orthopedics;  Laterality: Right;    There were no vitals filed for this visit.   Subjective Assessment - 09/23/20 0855     Subjective Pt reports after the last session experiencing tightness and soreness of the L low back which is better, but still an issue.    Diagnostic tests 04/03/20 lumbar xray:    FINDINGS:  AP and lateral view intraoperative fluoroscopic images of the  lumbosacral spine are submitted, 2 images total. The images  demonstrate bilateral pedicle screws at the L5 and S1 levels.  Vertical interconnecting rods were not present at the time the  images were taken. An L5-S1 interbody device is also present.  Overlying retractors.    Patient Stated Goals Pt states wanting to be able to  sleep through the night and enjoy grandkids    Currently in Pain? Yes    Pain Score 6     Pain Location Back    Pain Orientation Right;Left;Posterior;Lower    Pain Descriptors / Indicators Aching;Sharp;Tightness    Pain Type Chronic pain    Pain Radiating Towards down L leg    Pain Onset More than a month ago    Pain Frequency Constant    Aggravating Factors  prolonged sitting, squating, stepping up    Pain Relieving Factors back brace, heat and cold, massage gun, topical analgesic                               [facility] Adult PT Treatment/Exercise - 09/23/20 0001       Exercises   Exercises Lumbar;Knee/Hip      Lumbar Exercises: Stretches   Single Knee to Chest Stretch Right;Left;2 reps;20 seconds    Double Knee to Chest Stretch 2 reps;20 seconds     Lower Trunk Rotation 5 reps;20 seconds    Other Lumbar Stretch Exercise trunk flexion and laterally c green TBall, 6x 20 sec      Lumbar Exercises: Aerobic   Nustep 5 mins; L6; UEs/LEs   completes at a good pace     Lumbar Exercises: Standing   Functional Squats 10 reps    Functional Squats Limitations 10 lbs    Scapular Retraction Both;10 reps   2 sets   Theraband Level (Scapular Retraction) Level 3 (Green)    Row Both;10 reps   2 sets   Theraband Level (Row) Level 3 (Green)    Other Standing Lumbar Exercises Hip ext, plantargrade, 10x L and R    Other Standing Lumbar Exercises Palloff press 10x c Green Tband each direction      Lumbar Exercises: Supine   Bent Knee Raise --   3 reps, 10 sec   Dead Bug 10 reps   2 sets   Dead Bug Limitations LEs only    Bridge 10 reps;3 seconds    Bridge Limitations c PPT                      PT Short Term Goals - 09/09/20 1325       PT SHORT TERM GOAL #2   Title Ptwill voice understanding of measures to assist in the management and reduction of pain. 09/09/20, pt uses multiple techniques to help manage his pain: heat, cold, TENS, masssage gun tennis ball massase    Status Achieved    Target Date 09/09/20               PT Long Term Goals - 09/09/20 1327       PT LONG TERM GOAL #1   Title Increase trunk AROMs by at least 25% to improve pt's ability to complete functional activities. 09/09/20-Pt trunks ROMs have increased min to mod except L side bending which is the same    Baseline See flowsheets    Status Partially Met    Target Date 10/14/20      PT LONG TERM GOAL #2   Title Increase bilat knee and hip strength to at least 4+/5 strength for improved function ability. 09/09/20- R knee and hip have increased to 5/5 and 4+/5 respecively. L knee strength has increased to 4+/5 and the L hip is 4/5, improved but lss than set goal. L knee and hip strengths are impacted by  pain.    Status Partially Met    Target Date 10/14/20       PT LONG TERM GOAL #3   Title Pt will report 50% improvement in his ability to sleep. 09/09/20- pt reports 25% improvement in his sleep.    Status Partially Met    Target Date 10/14/20      PT LONG TERM GOAL #4   Title Pt will report a decrease in his pain range with ADL to 4-7/10 for improved QOL. 09/09/20- 7-10/10 pain rating    Baseline 7-10/10    Status On-going    Target Date 10/14/20      PT LONG TERM GOAL #5   Title Pt's FOTO score will increase to he predicted value of 51% functional ability. 08/07/20- 45%    Baseline 37%    Status On-going    Target Date 10/14/20      PT LONG TERM GOAL #6   Title Pt will be Ind in a final HEP to maintain or progress achieved LOF    Status On-going    Target Date 10/14/20      PT LONG TERM GOAL #7   Title Improve 5xSTS time to less than 16 sec as indication of improved function.    Baseline 21.3 sec    Status On-going    Target Date 10/14/20                   Plan - 09/23/20 0951     Clinical Impression Statement PT continued lumbopelvic flexibility and strengthening with gradual progression in strengthening completed today with dynamic stabilization. Pt completed bridging with greater ease and decreased discomfort. Pt tolerated the addition of Tband exs in standing without adverse effects. Will contnue to progress pt toward greater flexibity and strength to optimize functional mobility.    Personal Factors and Comorbidities Time since onset of injury/illness/exacerbation;Comorbidity 1;Comorbidity 2;Comorbidity 3+;Finances    Comorbidities see medical Hx    Examination-Activity Limitations Lift;Squat;Stand;Locomotion Level;Transfers    Examination-Participation Restrictions Other    Stability/Clinical Decision Making Evolving/Moderate complexity    Clinical Decision Making Moderate    Rehab Potential Good    PT Frequency 2x / week    PT Duration 8 weeks    PT Treatment/Interventions ADLs/Self Care Home Management;Aquatic  Therapy;Electrical Stimulation;Iontophoresis 62m/ml Dexamethasone;Moist Heat;Therapeutic exercise;Therapeutic activities;Functional mobility training;Stair training;Gait training;Patient/family education;Manual techniques;Spinal Manipulations;Taping    PT Next Visit Plan Assess response to HEP. KT tape for paraspinal inhibtion as needed. Progress ther ex as indicated. Work on improving hip flexor/psoas extensibility, and hip extension (consider leaning on counter + hip extension?).    PT Home Exercise Plan [id number]  Pt does have access to a pool.  Will add to HEP going forward. Progress back strengthening as tolerated    Consulted and Agree with Plan of Care Patient             Patient will benefit from skilled therapeutic intervention in order to improve the following deficits and impairments:  Decreased range of motion, Difficulty walking, Decreased activity tolerance, Pain, Decreased mobility, Decreased strength  Visit Diagnosis: Spondylolisthesis, unspecified spinal region  Chronic bilateral low back pain with left-sided sciatica  Decreased ROM of lumbar spine  Difficulty in walking, not elsewhere classified     Problem List Patient Active Problem List   Diagnosis Date Noted   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 12/03/2019   Vitamin B 12 deficiency 12/09/2017   Chronic post-traumatic stress disorder (PTSD) [place] 06/03/[place]  Major depression, recurrent, chronic (HCC) 01/01/2017   Chronic pain syndrome 09/03/2016   Insomnia 07/15/2015   Cervical spondylosis without myelopathy 02/06/2015   Obesity (BMI 30-39.9) 03/15/2013   BPH (benign prostatic hyperplasia) 11/15/2012   Type 2 diabetes mellitus with diabetic polyneuropathy, without long-term current use of insulin ([place]) 12/22/2010   Chronic pain of both knees 12/08/2008   Hyperlipidemia associated with type 2 diabetes mellitus ([place]), on statin 08/23/2006   Gout 08/23/2006   Obstructive sleep apnea  08/23/2006   Allergic rhinitis 08/23/2006   Chronic low back pain 08/23/2006   Seizure disorder ([place]) 08/23/2006   History of CVA (cerebrovascular accident) 08/23/2006    Gar Ponto MS, PT 09/23/20 10:03 AM   [facility] [address] [place], [place], [address] Phone: [phone]   Fax:  [phone]  Name: [name] MRN: [id number] Date of Birth: 03/25/1970

## 2020-09-30 ENCOUNTER — Ambulatory Visit: Payer: No Typology Code available for payment source

## 2020-10-06 ENCOUNTER — Telehealth: Payer: Self-pay | Admitting: Pulmonary Disease

## 2020-10-06 DIAGNOSIS — G4733 Obstructive sleep apnea (adult) (pediatric): Secondary | ICD-10-CM | POA: Diagnosis not present

## 2020-10-06 NOTE — Telephone Encounter (Signed)
NPSG >> very mild OSA 7/h So significant improvement from prior due to weight loss Schedule oV to discuss options, continue CPAP meantime

## 2020-10-06 NOTE — Procedures (Signed)
Patient Name: Jac, Romulus Date: 09/20/2020 Gender: Male D.O.B: 1970-11-19 Age (years): 62 Referring Provider: Cyril Mourning MD, ABSM Height (inches): 70 Interpreting Physician: Cyril Mourning MD, ABSM Weight (lbs): 209 RPSGT: Cherylann Parr BMI: 30 MRN: [id number] Neck Size: 17.00 <br> <br> CLINICAL INFORMATION Sleep Study Type: NPSG    Indication for sleep study: Fatigue, Snoring, Witnesses Apnea / Gasping During Sleep, reassessment of OSA after weight loss, baraitric surgery    Epworth Sleepiness Score: 15    SLEEP STUDY TECHNIQUE As per the AASM Manual for the Scoring of Sleep and Associated Events v2.3 (April 2016) with a hypopnea requiring 4% desaturations.  The channels recorded and monitored were frontal, central and occipital EEG, electrooculogram (EOG), submentalis EMG (chin), nasal and oral airflow, thoracic and abdominal wall motion, anterior tibialis EMG, snore microphone, electrocardiogram, and pulse oximetry.  MEDICATIONS Medications self-administered by patient taken the night of the study : TRAZODONE, OXYCODONE HCL, CYCLOBENZAPRINE HCL, VENLAFAXINE  SLEEP ARCHITECTURE The study was initiated at 11:14:49 PM and ended at 5:15:43 AM.  Sleep onset time was 0.3 minutes and the sleep efficiency was 96.6%%. The total sleep time was 348.5 minutes.  Stage REM latency was 340.0 minutes.  The patient spent 3.6%% of the night in stage N1 sleep, 90.7%% in stage N2 sleep, 0.0%% in stage N3 and 5.7% in REM.  Alpha intrusion was absent.  Supine sleep was 91.25%.  RESPIRATORY PARAMETERS The overall apnea/hypopnea index (AHI) was 5.9 per hour. There were 21 total apneas, including 8 obstructive, 13 central and 0 mixed apneas. There were 13 hypopneas and 12 RERAs.  The AHI during Stage REM sleep was 6.0 per hour.  AHI while supine was 5.7 per hour.  The mean oxygen saturation was 93.1%. The minimum SpO2 during sleep was 87.0%.  loud snoring was noted during  this study.  CARDIAC DATA The 2 lead EKG demonstrated sinus rhythm, atrial fibrillation. The mean heart rate was 72.9 beats per minute. Other EKG findings include: PVCs.  LEG MOVEMENT DATA The total PLMS were 0 with a resulting PLMS index of 0.0. Associated arousal with leg movement index was 0.0 .  IMPRESSIONS - Very mild obstructive sleep apnea occurred during this study (AHI = 5.9/h). - No significant central sleep apnea occurred during this study (CAI = 2.2/h). - Mild oxygen desaturation was noted during this study (Min O2 = 87.0%). - The patient snored with loud snoring volume. - EKG findings include PVCs. - Clinically significant periodic limb movements did not occur during sleep. No significant associated arousals.   DIAGNOSIS - Obstructive Sleep Apnea (G47.33)   RECOMMENDATIONS - Very mild obstructive sleep apnea. Return to discuss treatment options. - Avoid alcohol, sedatives and other CNS depressants that may worsen sleep apnea and disrupt normal sleep architecture. - Sleep hygiene should be reviewed to assess factors that may improve sleep quality. - Weight management and regular exercise should be initiated or continued if appropriate.   Cyril Mourning MD Board Certified in Sleep medicine

## 2020-10-07 ENCOUNTER — Other Ambulatory Visit: Payer: Self-pay

## 2020-10-07 ENCOUNTER — Ambulatory Visit: Payer: No Typology Code available for payment source | Attending: Neurosurgery

## 2020-10-07 DIAGNOSIS — M4310 Spondylolisthesis, site unspecified: Secondary | ICD-10-CM

## 2020-10-07 DIAGNOSIS — M5442 Lumbago with sciatica, left side: Secondary | ICD-10-CM | POA: Diagnosis present

## 2020-10-07 DIAGNOSIS — M5386 Other specified dorsopathies, lumbar region: Secondary | ICD-10-CM

## 2020-10-07 DIAGNOSIS — R262 Difficulty in walking, not elsewhere classified: Secondary | ICD-10-CM | POA: Diagnosis present

## 2020-10-07 DIAGNOSIS — G8929 Other chronic pain: Secondary | ICD-10-CM | POA: Diagnosis present

## 2020-10-07 NOTE — Therapy (Signed)
[place], [place], [address] Phone: [phone]   Fax:  [phone]  Physical Therapy Treatment  Patient Details  Name: [name] MRN: [id number] Date of Birth: [birth year] Referring Provider (PT): Earnie Larsson, MD   Encounter Date: 10/07/2020   PT End of Session - 10/07/20 0858     Visit Number 15    Number of Visits 16    Date for PT Re-Evaluation 09/12/20    [facility]ization Type [facility]    Authorization Time Period 03/09/20-03/09/21. Re-assess FOTO on 16th visits    Authorization - Visit Number 52    Authorization - Number of Visits 16    Progress Note Due on Visit 16    PT Start Time 4081    PT Stop Time 0935    PT Time Calculation (min) 40 min             Past Medical History:  Diagnosis Date   Adrenal abnormality (HCC)    Allergy    Anxiety    Arthritis    Atypical chest pain    BPH (benign prostatic hypertrophy)    Complication of anesthesia    Hard time waking up after neck surgery   Depression    DM (diabetes mellitus) ([place])    type II   GERD (gastroesophageal reflux disease)    "not since Gastric Bypass"   Gout    H/O: CVA (cardiovascular accident)    Hyperlipidemia    Knee pain, bilateral    LBP (low back pain)    OSA (obstructive sleep apnea)    Plantar fasciitis    PTSD (post-traumatic stress disorder)    Seizure disorder ([place])    Stroke ([place]) 2001   some memory loss- math , science     Past Surgical History:  Procedure Laterality Date   CARPAL TUNNEL RELEASE Bilateral    COLONOSCOPY     ENDOSCOPIC PLANTAR FASCIOTOMY     ESOPHAGOGASTRODUODENOSCOPY     GASTRIC ROUX-EN-Y N/A 12/03/2019   Procedure: LAPAROSCOPIC ROUX-EN-Y GASTRIC BYPASS WITH UPPER ENDOSCOPY;  Surgeon: Johnathan Hausen, MD;  Location[facility];  Service: General;  Laterality: N/A;   HEEL SPUR SURGERY Left    LUMBAR LAMINECTOMY/DECOMPRESSION MICRODISCECTOMY  2022   Dr. Annette Stable   NECK  SURGERY     Titanium Plate in Vertebrae   SHOULDER SURGERY Right    TRIGGER FINGER RELEASE Right 05/14/2015   Procedure: RIGHT THUMB TRIGGER RELEASE ;  Surgeon: Leanora Cover, MD;  Location: [facility];  Service: Orthopedics;  Laterality: Right;    There were no vitals filed for this visit.   Subjective Assessment - 10/07/20 1335     Subjective Pt reports his low back pain is about the same. The past week was a little more difficult with travelling to visit family and taking care of a puppy.    Diagnostic tests 04/03/20 lumbar xray:    FINDINGS:  AP and lateral view intraoperative fluoroscopic images of the  lumbosacral spine are submitted, 2 images total. The images  demonstrate bilateral pedicle screws at the L5 and S1 levels.  Vertical interconnecting rods were not present at the time the  images were taken. An L5-S1 interbody device is also present.  Overlying retractors.    Patient Stated Goals Pt states wanting to be able to sleep through the night and enjoy grandkids    Currently in Pain? Yes    Pain Score 7  Pain Location Back    Pain Orientation Right;Left;Posterior;Lower    Pain Descriptors / Indicators Aching;Sharp;Tightness    Pain Type Chronic pain    Pain Radiating Towards down the L leg    Pain Onset More than a month ago    Pain Frequency Constant    Aggravating Factors  prolonged sitting, squating, stepping up    Pain Relieving Factors prolonged sitting, squating, stepping up                               [facility] Adult PT Treatment/Exercise - 10/07/20 0001       Exercises   Exercises Lumbar;Knee/Hip      Lumbar Exercises: Stretches   Other Lumbar Stretch Exercise trunk flexion and laterally c green TBall, 6x 20 sec      Lumbar Exercises: Aerobic   Nustep 7 mins; L6; UEs/LEs   completes at a good pace     Lumbar Exercises: Standing   Functional Squats --    Functional Squats Limitations --    Scapular Retraction Both;10 reps    2 sets   Theraband Level (Scapular Retraction) Level 4 (Blue)    Row Both;10 reps   2 sets   Theraband Level (Row) Level 4 (Blue)    Other Standing Lumbar Exercises Palloff press, side steps 5x c Green Tband each direction   more discomfort L low back c side steps R     Lumbar Exercises: Seated   Sit to Stand 10 reps   2 sets. Pt experienced les low back pain c increased hip hinge/ body weight over feet prior to standing.     Knee/Hip Exercises: Standing   Lateral Step Up 3 sets;10 reps;Hand Hold: 0;Step Height: 4"    Other Standing Knee Exercises Split squat 15x, L and R, knee to Bosu ball      Knee/Hip Exercises: Seated   Sit to Sand 2 sets;10 reps                  Upper Extremity Functional Index Score :   /80   PT Education - 10/07/20 1340     Education Details Hinged hip with sit t/f standing    Person(s) Educated Patient    Methods Explanation    Comprehension Verbalized understanding              PT Short Term Goals - 09/09/20 1325       PT SHORT TERM GOAL #2   Title Ptwill voice understanding of measures to assist in the management and reduction of pain. 09/09/20, pt uses multiple techniques to help manage his pain: heat, cold, TENS, masssage gun tennis ball massase    Status Achieved    Target Date 09/09/20               PT Long Term Goals - 09/09/20 1327       PT LONG TERM GOAL #1   Title Increase trunk AROMs by at least 25% to improve pt's ability to complete functional activities. 09/09/20-Pt trunks ROMs have increased min to mod except L side bending which is the same    Baseline See flowsheets    Status Partially Met    Target Date 10/14/20      PT LONG TERM GOAL #2   Title Increase bilat knee and hip strength to at least 4+/5 strength for improved function ability. 09/09/20- R knee and hip have increased to 5/5 and  4+/5 respecively. L knee strength has increased to 4+/5 and the L hip is 4/5, improved but lss than set goal. L knee and hip  strengths are impacted by pain.    Status Partially Met    Target Date 10/14/20      PT LONG TERM GOAL #3   Title Pt will report 50% improvement in his ability to sleep. 09/09/20- pt reports 25% improvement in his sleep.    Status Partially Met    Target Date 10/14/20      PT LONG TERM GOAL #4   Title Pt will report a decrease in his pain range with ADL to 4-7/10 for improved QOL. 09/09/20- 7-10/10 pain rating    Baseline 7-10/10    Status On-going    Target Date 10/14/20      PT LONG TERM GOAL #5   Title Pt's FOTO score will increase to he predicted value of 51% functional ability. 08/07/20- 45%    Baseline 37%    Status On-going    Target Date 10/14/20      PT LONG TERM GOAL #6   Title Pt will be Ind in a final HEP to maintain or progress achieved LOF    Status On-going    Target Date 10/14/20      PT LONG TERM GOAL #7   Title Improve 5xSTS time to less than 16 sec as indication of improved function.    Baseline 21.3 sec    Status On-going    Target Date 10/14/20                   Plan - 10/07/20 1340     Clinical Impression Statement Pt continued lumbopelvic/hip strengthening with functional activities including sit t/f standing c hinged hips, split squats, and lateral step ups. Pt reported dicomfort with sit t/f standing, but with improved forward hinging and postioning weight over feet prior to standing, this discomfort was improved. Pt tolerated the progress in physical demand without adverse effects.    Personal Factors and Comorbidities Time since onset of injury/illness/exacerbation;Comorbidity 1;Comorbidity 2;Comorbidity 3+;Finances    Comorbidities see medical Hx    Examination-Activity Limitations Lift;Squat;Stand;Locomotion Level;Transfers    Examination-Participation Restrictions Other   ADLs   Stability/Clinical Decision Making Evolving/Moderate complexity    Clinical Decision Making Moderate    Rehab Potential Good    PT Frequency 2x / week    PT  Duration 8 weeks    PT Treatment/Interventions ADLs/Self Care Home Management;Aquatic Therapy;Electrical Stimulation;Iontophoresis 35m/ml Dexamethasone;Moist Heat;Therapeutic exercise;Therapeutic activities;Functional mobility training;Stair training;Gait training;Patient/family education;Manual techniques;Spinal Manipulations;Taping    PT Next Visit Plan Assess response to HEP. KT tape for paraspinal inhibtion as needed. Progress ther ex as indicated. Work on improving hip flexor/psoas extensibility, and hip extension (consider leaning on counter + hip extension?). Re-assess for DC vs. continuation of PT services.    PT Home Exercise Plan [id number]  Pt does have access to a pool.  Will add to HEP going forward. Progress back strengthening as tolerated    Consulted and Agree with Plan of Care Patient             Patient will benefit from skilled therapeutic intervention in order to improve the following deficits and impairments:  Decreased range of motion, Difficulty walking, Decreased activity tolerance, Pain, Decreased mobility, Decreased strength  Visit Diagnosis: Spondylolisthesis, unspecified spinal region  Chronic bilateral low back pain with left-sided sciatica  Decreased ROM of lumbar spine  Difficulty in walking, not elsewhere classified  Problem List Patient Active Problem List   Diagnosis Date Noted   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 12/03/2019   Vitamin B 12 deficiency 12/09/2017   Chronic post-traumatic stress disorder (PTSD) after military combat 06/03/2017   Major depression, recurrent, chronic ([place]) 01/01/2017   Chronic pain syndrome 09/03/2016   Insomnia 07/15/2015   Cervical spondylosis without myelopathy 02/06/2015   Obesity (BMI 30-39.9) 03/15/2013   BPH (benign prostatic hyperplasia) 11/15/2012   Type 2 diabetes mellitus with diabetic polyneuropathy, without long-term current use of insulin ([place]) 12/22/2010   Chronic pain of both knees 12/08/2008    Hyperlipidemia associated with type 2 diabetes mellitus ([place]), on statin 08/23/2006   Gout 08/23/2006   Obstructive sleep apnea 08/23/2006   Allergic rhinitis 08/23/2006   Chronic low back pain 08/23/2006   Seizure disorder ([place]) 08/23/2006   History of CVA (cerebrovascular accident) 08/23/2006   Gar Ponto MS, PT 10/07/20 1:57 PM   [facility] [address] [place], [place], [address] Phone: [phone]   Fax:  [phone]  Name: [name] MRN: [id number] Date of Birth: August 02, 1970

## 2020-10-07 NOTE — Telephone Encounter (Signed)
Called and spoke with patient to go over sleep study results and get patient scheduled for follow up visit. Patient expressed understanding and will continue to wear his cpap. Patient has been scheduled for follow up in November. Nothing further needed at this time.

## 2020-10-08 ENCOUNTER — Encounter: Payer: Self-pay | Admitting: Family Medicine

## 2020-10-08 ENCOUNTER — Ambulatory Visit (INDEPENDENT_AMBULATORY_CARE_PROVIDER_SITE_OTHER): Payer: BC Managed Care – PPO | Admitting: Family Medicine

## 2020-10-08 VITALS — BP 118/80 | HR 90 | Temp 97.8°F | Ht 70.0 in | Wt 215.8 lb

## 2020-10-08 DIAGNOSIS — E1169 Type 2 diabetes mellitus with other specified complication: Secondary | ICD-10-CM

## 2020-10-08 DIAGNOSIS — Z1212 Encounter for screening for malignant neoplasm of rectum: Secondary | ICD-10-CM

## 2020-10-08 DIAGNOSIS — Z8673 Personal history of transient ischemic attack (TIA), and cerebral infarction without residual deficits: Secondary | ICD-10-CM | POA: Diagnosis not present

## 2020-10-08 DIAGNOSIS — E1142 Type 2 diabetes mellitus with diabetic polyneuropathy: Secondary | ICD-10-CM | POA: Diagnosis not present

## 2020-10-08 DIAGNOSIS — G894 Chronic pain syndrome: Secondary | ICD-10-CM

## 2020-10-08 DIAGNOSIS — Z1211 Encounter for screening for malignant neoplasm of colon: Secondary | ICD-10-CM | POA: Diagnosis not present

## 2020-10-08 DIAGNOSIS — G4733 Obstructive sleep apnea (adult) (pediatric): Secondary | ICD-10-CM

## 2020-10-08 DIAGNOSIS — E785 Hyperlipidemia, unspecified: Secondary | ICD-10-CM

## 2020-10-08 DIAGNOSIS — Z23 Encounter for immunization: Secondary | ICD-10-CM

## 2020-10-08 LAB — POCT GLYCOSYLATED HEMOGLOBIN (HGB A1C): Hemoglobin A1C: 5.8 % — AB (ref 4.0–5.6)

## 2020-10-08 NOTE — Patient Instructions (Addendum)
Please return in 6 months for your annual complete physical; please come fasting.   If you have any questions or concerns, please don't hesitate to send me a message via MyChart or call the office at [phone]. Thank you for visiting with [place] today! It's our pleasure caring for you.   Today you were given your Flu vaccination.    We will call you with information regarding your referral appointment. GI specialist to get set up for colonoscopy: this is for colon cancer screening.  If you do not hear from [place] within the next 2 weeks, please let me know. It can take 1-2 weeks to get appointments set up with the specialists.

## 2020-10-08 NOTE — Progress Notes (Signed)
Subjective  CC:  Chief Complaint  Patient presents with   Diabetes   Hyperlipidemia    HPI: OSAZE HUBBERT is a 50 y.o. male who presents to the office today for follow up of diabetes and problems listed above in the chief complaint.  Diabetes follow up:he remains diet controlled.  His diabetic control is reported as Unchanged. Doing well with diet. No sxs of high sugars.  He denies exertional CP or SOB or symptomatic hypoglycemia. He denies foot sores or new paresthesias.  HLD: tolerating crestor 20 nightly. Ldl near goal. No AEs HM: due colon cancer screen and eligible for flu vaccination Sleep apnea: reviewed recent sleep test results< this was done for f/u after weight loss and weight loss surgery: very mild sleep apnea, uses cpap. No movement disorders identified. Has f/u to get treatment options.  Chronic pain: coping well. On chronic pain meds; interferes with sleep. Mood is holding steady. Uses high dose trazodone and high dose effexor for sleep and mood.   Wt Readings from Last 3 Encounters:  10/08/20 215 lb 12.8 oz (97.9 kg)  09/20/20 209 lb (94.8 kg)  06/03/20 210 lb (95.3 kg)    BP Readings from Last 3 Encounters:  10/08/20 118/80  06/03/20 120/78  04/04/20 106/67    Assessment  1. Type 2 diabetes mellitus with diabetic polyneuropathy, without long-term current use of insulin (HCC)   2. Hyperlipidemia associated with type 2 diabetes mellitus (HCC), on statin   3. History of CVA (cerebrovascular accident)   4. Screening for colorectal cancer   5. Chronic pain syndrome   6. Obstructive sleep apnea      Plan  Diabetes is currently very well controlled. Diet controlled. Imms: flu shot today. Eye exam w/o retinopathy. No changes today HLD: continue 20mg  crestor nightly. Will eat low cholesterol diet. Recheck in 6 months and adjust statin dose if indicated at that time H/o CVA: goal ldl < 70. Bp is controlled. On asa Refer for colonoscopy. Counseling given.   Chronic pain and depression and insomnia: contniue current meds: following along with specialist OSA: may be able to come off cpap.   Follow up: Return in about 6 months (around 04/07/2021) for complete physical, follow up Diabetes.. Orders Placed This Encounter  Procedures   Ambulatory referral to Gastroenterology   POCT HgB A1C   No orders of the defined types were placed in this encounter.     Immunization History  Administered Date(s) Administered   Influenza Split 09/13/2011   Influenza Whole 11/18/2008   Influenza,inj,Quad PF,6+ Mos 11/15/2012, 11/12/2013, 09/26/2016, 11/21/2017, 11/19/2018, 12/11/2019   PFIZER(Purple Top)SARS-COV-2 Vaccination 04/14/2019, 05/12/2019, 01/05/2020, 05/23/2020   Pneumococcal Polysaccharide-23 12/22/2010   Td 03/16/2009   Tdap 02/17/2011   Zoster Recombinat (Shingrix) 07/05/2019, 01/10/2020    Diabetes Related Lab Review: Lab Results  Component Value Date   HGBA1C 5.8 (A) 10/08/2020   HGBA1C 5.4 06/03/2020   HGBA1C 5.4 03/05/2020    Lab Results  Component Value Date   MICROALBUR <0.7 03/05/2020   Lab Results  Component Value Date   CREATININE 0.89 06/03/2020   BUN 10 06/03/2020   NA 141 06/03/2020   K 4.0 06/03/2020   CL 105 06/03/2020   CO2 29 06/03/2020   Lab Results  Component Value Date   CHOL 153 06/03/2020   CHOL 226 (H) 03/05/2020   CHOL 130 02/25/2019   Lab Results  Component Value Date   HDL 62.00 06/03/2020   HDL 55.80 03/05/2020   HDL  47.60 02/25/2019   Lab Results  Component Value Date   LDLCALC 79 06/03/2020   LDLCALC 153 (H) 03/05/2020   LDLCALC 69 02/25/2019   Lab Results  Component Value Date   TRIG 61.0 06/03/2020   TRIG 86.0 03/05/2020   TRIG 68.0 02/25/2019   Lab Results  Component Value Date   CHOLHDL 2 06/03/2020   CHOLHDL 4 03/05/2020   CHOLHDL 3 02/25/2019   Lab Results  Component Value Date   LDLDIRECT 148.1 11/04/2010   LDLDIRECT 207.7 02/08/2007   The 10-year ASCVD risk score  (Arnett DK, et al., 2019) is: 13.2%   Values used to calculate the score:     Age: 64 years     Sex: Male     Is Non-Hispanic African American: Yes     Diabetic: Yes     Tobacco smoker: Yes     Systolic Blood Pressure: 118 mmHg     Is BP treated: No     HDL Cholesterol: 62 mg/dL     Total Cholesterol: 153 mg/dL I have reviewed the PMH, Fam and Soc history. Patient Active Problem List   Diagnosis Date Noted   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 12/03/2019    Priority: High   Chronic post-traumatic stress disorder (PTSD) after military combat 06/03/2017    Priority: High   Major depression, recurrent, chronic (HCC) 01/01/2017    Priority: High   Chronic pain syndrome 09/03/2016    Priority: High   Obesity (BMI 30-39.9) 03/15/2013    Priority: High    The patient is asked to make an attempt to improve diet and exercise patterns to aid in medical management of this problem.     Type 2 diabetes mellitus with diabetic polyneuropathy, without long-term current use of insulin (HCC) 12/22/2010    Priority: High    dxd in 2009    Hyperlipidemia associated with type 2 diabetes mellitus (HCC), on statin 08/23/2006    Priority: High   Obstructive sleep apnea 08/23/2006    Priority: High    Sleep study 08/2000  NPSG 2006:  AHI 78/hr. Auto optimized to 13-14 cm.  Autoset 01/2011 to 13 cm.    Chronic low back pain 08/23/2006    Priority: High    Using back brace.  Using as needed Percocet.     Seizure disorder (HCC) 08/23/2006    Priority: High   History of CVA (cerebrovascular accident) 08/23/2006    Priority: High   Insomnia 07/15/2015    Priority: Medium   Cervical spondylosis without myelopathy 02/06/2015    Priority: Medium   BPH (benign prostatic hyperplasia) 11/15/2012    Priority: Medium   Chronic pain of both knees 12/08/2008    Priority: Medium    Severe. Daily pain 8/10, down to 7/10 with antiinflammatory. Interfering with life. Limited exercise due to pain.        Gout 08/23/2006    Priority: Medium   Vitamin B 12 deficiency 12/09/2017    Priority: Low   Allergic rhinitis 08/23/2006    Priority: Low    Generally controlled with Zyrtec.     Social History: Patient  reports that he quit smoking about 29 years ago. His smoking use included cigarettes. He has never used smokeless tobacco. He reports that he does not drink alcohol and does not use drugs.  Review of Systems: Ophthalmic: negative for eye pain, loss of vision or double vision Cardiovascular: negative for chest pain Respiratory: negative for SOB or persistent  cough Gastrointestinal: negative for abdominal pain Genitourinary: negative for dysuria or gross hematuria MSK: negative for foot lesions Neurologic: negative for weakness or gait disturbance  Objective  Vitals: BP 118/80   Pulse 90   Temp 97.8 F (36.6 C) (Temporal)   Ht 5\' 10"  (1.778 m)   Wt 215 lb 12.8 oz (97.9 kg)   SpO2 94%   BMI 30.96 kg/m  General: well appearing, no acute distress  Psych:  Alert and oriented, normal mood and affect HEENT:  Normocephalic, atraumatic, moist mucous membranes, supple neck  Cardiovascular:  Nl S1 and S2, RRR without murmur, gallop or rub. no edema Respiratory:  Good breath sounds bilaterally, CTAB with normal effort, no rales      Diabetic education: ongoing education regarding chronic disease management for diabetes was given today. We continue to reinforce the ABC's of diabetic management: A1c (<7 or 8 dependent upon patient), tight blood pressure control, and cholesterol management with goal LDL < 100 minimally. We discuss diet strategies, exercise recommendations, medication options and possible side effects. At each visit, we review recommended immunizations and preventive care recommendations for diabetics and stress that good diabetic control can prevent other problems. See below for this patient's data.   Commons side effects, risks, benefits, and alternatives for  medications and treatment plan prescribed today were discussed, and the patient expressed understanding of the given instructions. Patient is instructed to call or message via MyChart if he/she has any questions or concerns regarding our treatment plan. No barriers to understanding were identified. We discussed Red Flag symptoms and signs in detail. Patient expressed understanding regarding what to do in case of urgent or emergency type symptoms.  Medication list was reconciled, printed and provided to the patient in AVS. Patient instructions and summary information was reviewed with the patient as documented in the AVS. This note was prepared with assistance of Dragon voice recognition software. Occasional wrong-word or sound-a-like substitutions may have occurred due to the inherent limitations of voice recognition software  This visit occurred during the SARS-CoV-2 public health emergency.  Safety protocols were in place, including screening questions prior to the visit, additional usage of staff PPE, and extensive cleaning of exam room while observing appropriate contact time as indicated for disinfecting solutions.

## 2020-10-16 ENCOUNTER — Ambulatory Visit: Payer: No Typology Code available for payment source

## 2020-10-16 ENCOUNTER — Other Ambulatory Visit: Payer: Self-pay

## 2020-10-16 DIAGNOSIS — M4310 Spondylolisthesis, site unspecified: Secondary | ICD-10-CM

## 2020-10-16 DIAGNOSIS — R262 Difficulty in walking, not elsewhere classified: Secondary | ICD-10-CM

## 2020-10-16 DIAGNOSIS — G8929 Other chronic pain: Secondary | ICD-10-CM

## 2020-10-16 DIAGNOSIS — M5386 Other specified dorsopathies, lumbar region: Secondary | ICD-10-CM

## 2020-10-16 NOTE — Therapy (Signed)
[facility] [place], [place], [address] Phone: [phone]   Fax:  [phone]  Physical Therapy Treatment  Patient Details  Name: [name] MRN: [id number] Date of Birth: 1970/08/09 Referring Provider (PT): Earnie Larsson, MD   Encounter Date: 10/16/2020   PT End of Session - 10/16/20 0816     Visit Number 16    Number of Visits 16    Date for PT Re-Evaluation 09/12/20    Authorization Type [facility]    Authorization Time Period 03/09/20-03/09/21. Re-assess FOTO on 16th visits    Authorization - Visit Number 15    Authorization - Number of Visits 15    PT Start Time [phone]    PT Stop Time 0802    PT Time Calculation (min) 45 min    Equipment Utilized During Treatment Other (comment)    Activity Tolerance Patient tolerated treatment well;Patient limited by pain    Behavior During Therapy WFL for tasks assessed/performed             Past Medical History:  Diagnosis Date   Adrenal abnormality (HCC)    Allergy    Anxiety    Arthritis    Atypical chest pain    BPH (benign prostatic hypertrophy)    Complication of anesthesia    Hard time waking up after neck surgery   Depression    DM (diabetes mellitus) ([place])    type II   GERD (gastroesophageal reflux disease)    "not since Gastric Bypass"   Gout    H/O: CVA (cardiovascular accident)    Hyperlipidemia    Knee pain, bilateral    LBP (low back pain)    OSA (obstructive sleep apnea)    Plantar fasciitis    PTSD (post-traumatic stress disorder)    Seizure disorder ([place])    Stroke ([place]) 2001   some memory loss- math , science     Past Surgical History:  Procedure Laterality Date   CARPAL TUNNEL RELEASE Bilateral    COLONOSCOPY     ENDOSCOPIC PLANTAR FASCIOTOMY     ESOPHAGOGASTRODUODENOSCOPY     GASTRIC ROUX-EN-Y N/A 12/03/2019   Procedure: LAPAROSCOPIC ROUX-EN-Y GASTRIC BYPASS WITH UPPER ENDOSCOPY;  Surgeon: Johnathan Hausen, MD;   Location: [facility];  Service: General;  Laterality: N/A;   HEEL SPUR SURGERY Left    LUMBAR LAMINECTOMY/DECOMPRESSION MICRODISCECTOMY  2022   Dr. Annette Stable   NECK SURGERY     Titanium Plate in Ver[place]bra[place]   SHOULDER SURGERY Right    TRIGGER FINGER RELEASE Right 05/14/2015   Procedure: RIGHT THUMB TRIGGER RELEASE ;  Surgeon: Leanora Cover, MD;  Location: [facility];  Service: Orthopedics;  Laterality: Right;    There were no vitals filed for this visit.   Subjective Assessment - 10/16/20 0721     Subjective Pt reports he fell when visiting family 2 weeks ago when a large puppy knocked him off balance and he fell on his back. Since then his low back has been bothering more. He saw Dr. Trenton Gammon yesterday and xrays showed the low back hardware was in place. He also received a script for a dose pack of predisone which he has not started yet. Pt notes numbess of both legs a few days ago which concerned him. The numbness did resololve, but he experiences the numbness on a lesser degree at times. Pt states he would like to continued to work on flexibility and strength. With the recent fall he notes  his flexibilty is not as good.    How long can you sit comfortably? 15 mins    Diagnostic tests 04/03/20 lumbar xray:    FINDINGS:  AP and lateral view intraoperative fluoroscopic images of the  lumbosacral spine are submitted, 2 images total. The images  demonstrate bilateral pedicle screws at the L5 and S1 levels.  Vertical interconnecting rods were not present at the time the  images were taken. An L5-S1 interbody device is also present.  Overlying retractors.    Patient Stated Goals Pt states wanting to be able to sleep through the night and enjoy grandkids. To tolerate sitting longer and to have greater flexibility.    Currently in Pain? Yes    Pain Score 8     Pain Location Back    Pain Orientation Right;Left    Pain Descriptors / Indicators Aching;Sharp;Tightness    Pain Type Chronic pain    Pain  Onset More than a month ago    Pain Frequency Constant    Aggravating Factors  prolonged sitting, squating, stepping up    Pain Relieving Factors back brace, heat and cold, massage gun, topical analgesic                               [facility] Adult PT Treatment/Exercise - 10/16/20 0001       Lumbar Exercises: Stretches   Passive Hamstring Stretch Right;Left;2 reps;30 seconds    Double Knee to Chest Stretch 2 reps;20 seconds    Lower Trunk Rotation 5 reps;20 seconds    Piriformis Stretch Right;Left;2 reps;20 seconds    Other Lumbar Stretch Exercise trunk flexion and laterally c green TBall, 6x 20 sec      Lumbar Exercises: Aerobic   Nustep 6 mins; L6; UEs/LEs   completes at a good pace     Lumbar Exercises: Supine   Pelvic Tilt 15 reps;3 reps    Bent Knee Raise --   3 reps, 20 sec   Bridge 15 reps;3 seconds    Bridge Limitations c PPT                     PT Education - 10/16/20 0815     Education Details Reviewed FOTO score    Person(s) Educated Patient    Methods Explanation    Comprehension Verbalized understanding              PT Short Term Goals - 09/09/20 1325       PT SHORT TERM GOAL #2   Title Ptwill voice understanding of measures to assist in the management and reduction of pain. 09/09/20, pt uses multiple techniques to help manage his pain: heat, cold, TENS, masssage gun tennis ball massase    Status Achieved    Target Date 09/09/20               PT Long Term Goals - 10/16/20 0831       PT LONG TERM GOAL #1   Title Increase trunk AROMs by at least 25% to improve pt's ability to complete functional activities. 09/09/20-Pt trunks ROMs have increased min to mod except L side bending which is the same    Baseline See flowsheets    Status Partially Met    Target Date 12/12/20      PT LONG TERM GOAL #2   Title Increase bilat knee and hip strength to at least 4+/5 strength for improved function ability. 09/09/20-  R knee and  hip have increased to 5/5 and 4+/5 respecively. L knee strength has increased to 4+/5 and the L hip is 4/5, improved but lss than set goal. L knee and hip strengths are impacted by pain.    Status On-going    Target Date 12/12/20      PT LONG TERM GOAL #3   Title Pt will report 50% improvement in his ability to sleep. 09/09/20- pt reports 25% improvement in his sleep.    Status On-going    Target Date 12/12/20      PT LONG TERM GOAL #4   Title Pt will report a decrease in his pain range with ADL to 4-7/10 for improved QOL. 09/09/20- 7-10/10 pain rating    Status On-going    Target Date 12/12/20      PT LONG TERM GOAL #5   Title Pt's FOTO score will increase to he predicted value of 51% functional ability. 08/07/20- 45%. 10/16/20: 39%    Baseline 37%    Status On-going    Target Date 12/12/20      PT LONG TERM GOAL #6   Title Pt will be Ind in a final HEP to maintain or progress achieved LOF    Status On-going    Target Date 12/12/20      PT LONG TERM GOAL #7   Title Improve 5xSTS time to less than 16 sec as indication of improved function.    Baseline 21.3 sec    Status On-going    Target Date 12/12/20                   Plan - 10/16/20 0720     Clinical Impression Statement Pt presents to PT with increased pain following a fall 2 weeks ago where he landed on his back. Pt saw Dr. Irven Baltimore NP, Jinny Blossom yesterday. Xrays were completed and pt reports the hardware is intact. He received a predisone dose pack script which he has not picked up yet. With pt's recent increase in pain following a fall on his back, PT was completed to reestablish flexibility and strengthening exs he is able to tolerate. Progression of strengthening exs was withheld today. Pt's FOTO score was reassessed, but was decreased, reflecting recent fall. Pt overall, pt was progessing with flexibility, strength, pain and function, but has had a set back with the fall. Recommend the continuation of skilled PT 1w6 to  address deficits to improve pt's function and tolerance to activity.    Personal Factors and Comorbidities Time since onset of injury/illness/exacerbation;Comorbidity 1;Comorbidity 2;Comorbidity 3+;Finances    Comorbidities see medical Hx    Examination-Activity Limitations Lift;Squat;Stand;Locomotion Level;Transfers    Examination-Participation Restrictions Other   ADLs   Stability/Clinical Decision Making Evolving/Moderate complexity    Clinical Decision Making Moderate    Rehab Potential Good    PT Frequency 1x / week    PT Duration 6 weeks    PT Treatment/Interventions ADLs/Self Care Home Management;Aquatic Therapy;Electrical Stimulation;Iontophoresis 4m/ml Dexamethasone;Moist Heat;Therapeutic exercise;Therapeutic activities;Functional mobility training;Stair training;Gait training;Patient/family education;Manual techniques;Spinal Manipulations;Taping    PT Next Visit Plan Progress strengthening exs to improve pt's ability to complete functional activity.    PT Home Exercise Plan [id number]  Pt does have access to a pool.  Will add to HEP going forward. Progress back strengthening as tolerated    Consulted and Agree with Plan of Care Patient             Patient will benefit from skilled therapeutic intervention in order  to improve the following deficits and impairments:  Decreased range of motion, Difficulty walking, Decreased activity tolerance, Pain, Decreased mobility, Decreased strength  Visit Diagnosis: Spondylolisthesis, unspecified spinal region  Chronic bilateral low back pain with left-sided sciatica  Decreased ROM of lumbar spine  Difficulty in walking, not elsewhere classified     Problem List Patient Active Problem List   Diagnosis Date Noted   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 12/03/2019   Vitamin B 12 deficiency 12/09/2017   Chronic post-traumatic stress disorder (PTSD) after military combat 06/03/2017   Major depression, recurrent, chronic ([place])  01/01/2017   Chronic pain syndrome 09/03/2016   Insomnia 07/15/2015   Cervical spondylosis without myelopathy 02/06/2015   Obesity (BMI 30-39.9) 03/15/2013   BPH (benign prostatic hyperplasia) 11/15/2012   Type 2 diabetes mellitus with diabetic polyneuropathy, without long-term current use of insulin ([place]) 12/22/2010   Chronic pain of both knees 12/08/2008   Hyperlipidemia associated with type 2 diabetes mellitus ([place]), on statin 08/23/2006   Gout 08/23/2006   Obstructive sleep apnea 08/23/2006   Allergic rhinitis 08/23/2006   Chronic low back pain 08/23/2006   Seizure disorder ([place]) 08/23/2006   History of CVA (cerebrovascular accident) 08/23/2006    Gar Ponto, PT 10/16/2020, 8:35 AM  [facility] [address] [place], [place], [address] Phone: [phone]   Fax:  [phone]  Name: [name] MRN: [id number] Date of Birth: Aug 18, 1970

## 2020-10-19 ENCOUNTER — Encounter: Payer: Self-pay | Admitting: Internal Medicine

## 2020-10-20 NOTE — Addendum Note (Signed)
Addended by: Joellyn Rued on: 10/20/2020 06:31 AM   Modules accepted: Orders

## 2020-10-28 ENCOUNTER — Ambulatory Visit: Payer: No Typology Code available for payment source

## 2020-11-05 ENCOUNTER — Ambulatory Visit: Payer: No Typology Code available for payment source

## 2020-11-11 ENCOUNTER — Ambulatory Visit: Payer: No Typology Code available for payment source | Attending: Neurosurgery

## 2020-11-11 ENCOUNTER — Other Ambulatory Visit: Payer: Self-pay

## 2020-11-11 DIAGNOSIS — M4310 Spondylolisthesis, site unspecified: Secondary | ICD-10-CM | POA: Insufficient documentation

## 2020-11-11 DIAGNOSIS — M5442 Lumbago with sciatica, left side: Secondary | ICD-10-CM | POA: Insufficient documentation

## 2020-11-11 DIAGNOSIS — R262 Difficulty in walking, not elsewhere classified: Secondary | ICD-10-CM | POA: Insufficient documentation

## 2020-11-11 DIAGNOSIS — M5386 Other specified dorsopathies, lumbar region: Secondary | ICD-10-CM | POA: Insufficient documentation

## 2020-11-11 DIAGNOSIS — G8929 Other chronic pain: Secondary | ICD-10-CM | POA: Insufficient documentation

## 2020-11-11 NOTE — Therapy (Addendum)
[place], [place], [address] Phone: [phone]   Fax:  [phone]  Physical Therapy Treatment  Patient Details  Name: [name] MRN: [id number] Date of Birth: May 29, 1970 Referring Provider (PT): Earnie Larsson, MD   Encounter Date: 11/11/2020   PT End of Session - 11/11/20 1005     Visit Number 17    Number of Visits 23    Date for PT Re-Evaluation 12/12/20    Authorization Type [facility] [employer] [facility]    Authorization Time Period 03/09/20-03/09/21. Re-assess FOTO on 23rd visits    Authorization - Visit Number 16    Authorization - Number of Visits 22    Progress Note Due on Visit 23    PT Start Time 0806    PT Stop Time 0848    PT Time Calculation (min) 42 min    Equipment Utilized During Treatment Other (comment)    Activity Tolerance Patient tolerated treatment well;Patient limited by pain    Behavior During Therapy WFL for tasks assessed/performed             Past Medical History:  Diagnosis Date   Adrenal abnormality (HCC)    Allergy    Anxiety    Arthritis    Atypical chest pain    BPH (benign prostatic hypertrophy)    Complication of anesthesia    Hard time waking up after neck surgery   Depression    DM (diabetes mellitus) ([place])    type II   GERD (gastroesophageal reflux disease)    "not since Gastric Bypass"   Gout    H/O: CVA (cardiovascular accident)    Hyperlipidemia    Knee pain, bilateral    LBP (low back pain)    OSA (obstructive sleep apnea)    Plantar fasciitis    PTSD (post-traumatic stress disorder)    Seizure disorder ([place])    Stroke ([place]) 2001   some memory loss- math , science     Past Surgical History:  Procedure Laterality Date   CARPAL TUNNEL RELEASE Bilateral    COLONOSCOPY     ENDOSCOPIC PLANTAR FASCIOTOMY     ESOPHAGOGASTRODUODENOSCOPY     GASTRIC ROUX-EN-Y N/A 12/03/2019   Procedure: LAPAROSCOPIC ROUX-EN-Y GASTRIC BYPASS WITH UPPER  ENDOSCOPY;  Surgeon: Johnathan Hausen, MD;  Location: [facility];  Service: General;  Laterality: N/A;   HEEL SPUR SURGERY Left    LUMBAR LAMINECTOMY/DECOMPRESSION MICRODISCECTOMY  2022   Dr. Annette Stable   NECK SURGERY     Titanium Plate in Vertebrae   SHOULDER SURGERY Right    TRIGGER FINGER RELEASE Right 05/14/2015   Procedure: RIGHT THUMB TRIGGER RELEASE ;  Surgeon: Leanora Cover, MD;  Location: [facility];  Service: Orthopedics;  Laterality: Right;    There were no vitals filed for this visit.   Subjective Assessment - 11/11/20 0814     Subjective Pt reports his pain has been overall some better since the fall associated c being knocked off balnce by a large puppy. He notes the pain can be up and down. He reports he has finished a predisone dose pack and is to see Dr. Trenton Gammon later this AM.    Diagnostic tests 04/03/20 lumbar xray:    FINDINGS:  AP and lateral view intraoperative fluoroscopic images of the  lumbosacral spine are submitted, 2 images total. The images  demonstrate bilateral pedicle screws at the L5 and S1 levels.  Vertical interconnecting rods were not present at the time the  images were taken. An L5-S1 interbody device is also present.  Overlying retractors.    Patient Stated Goals Pt states wanting to be able to sleep through the night and enjoy grandkids. To tolerate sitting longer and to have greater flexibility.    Currently in Pain? Yes    Pain Score 6     Pain Location Back    Pain Orientation Right;Left    Pain Descriptors / Indicators Aching;Sharp;Tightness    Pain Onset More than a month ago    Pain Frequency Constant    Aggravating Factors  prolonged sitting, squating, stepping up, walking    Pain Relieving Factors back brace, heat and cold, massage gun, topical analgesic                                           PT Short Term Goals - 09/09/20 1325       PT SHORT TERM GOAL #2   Title Ptwill voice understanding of measures  to assist in the management and reduction of pain. 09/09/20, pt uses multiple techniques to help manage his pain: heat, cold, TENS, masssage gun tennis ball massase    Status Achieved    Target Date 09/09/20               PT Long Term Goals - 10/16/20 0831       PT LONG TERM GOAL #1   Title Increase trunk AROMs by at least 25% to improve pt's ability to complete functional activities. 09/09/20-Pt trunks ROMs have increased min to mod except L side bending which is the same    Baseline See flowsheets    Status Partially Met    Target Date 12/12/20      PT LONG TERM GOAL #2   Title Increase bilat knee and hip strength to at least 4+/5 strength for improved function ability. 09/09/20- R knee and hip have increased to 5/5 and 4+/5 respecively. L knee strength has increased to 4+/5 and the L hip is 4/5, improved but lss than set goal. L knee and hip strengths are impacted by pain.    Status On-going    Target Date 12/12/20      PT LONG TERM GOAL #3   Title Pt will report 50% improvement in his ability to sleep. 09/09/20- pt reports 25% improvement in his sleep.    Status On-going    Target Date 12/12/20      PT LONG TERM GOAL #4   Title Pt will report a decrease in his pain range with ADL to 4-7/10 for improved QOL. 09/09/20- 7-10/10 pain rating    Status On-going    Target Date 12/12/20      PT LONG TERM GOAL #5   Title Pt's FOTO score will increase to he predicted value of 51% functional ability. 08/07/20- 45%. 10/16/20: 39%    Baseline 37%    Status On-going    Target Date 12/12/20      PT LONG TERM GOAL #6   Title Pt will be Ind in a final HEP to maintain or progress achieved LOF    Status On-going    Target Date 12/12/20      PT LONG TERM GOAL #7   Title Improve 5xSTS time to less than 16 sec as indication of improved function.    Baseline 21.3 sec    Status On-going    Target  Date 12/12/20                   Plan - 11/11/20 1006     Clinical Impression  Statement Pt returns to PT after approx 1 month. The back and L LE pain increase associated with a fall is overall better. PT was provided for lumbopelvic flexibility and core strengthening/stability. The demand for the strengthening exs was increased today. Pt reports tolerating today's session without adverse effects. Pt notes stretching exs are always challengeing with his L low back and LE, but beneficial.    Personal Factors and Comorbidities Time since onset of injury/illness/exacerbation;Comorbidity 1;Comorbidity 2;Comorbidity 3+;Finances    Comorbidities see medical Hx    Examination-Activity Limitations Lift;Squat;Stand;Locomotion Level;Transfers    Examination-Participation Restrictions Other    Stability/Clinical Decision Making Evolving/Moderate complexity    Clinical Decision Making Moderate    Rehab Potential Good    PT Frequency 1x / week    PT Duration 6 weeks    PT Treatment/Interventions ADLs/Self Care Home Management;Aquatic Therapy;Electrical Stimulation;Iontophoresis 60m/ml Dexamethasone;Moist Heat;Therapeutic exercise;Therapeutic activities;Functional mobility training;Stair training;Gait training;Patient/family education;Manual techniques;Spinal Manipulations;Taping    PT Next Visit Plan Progress strengthening exs to improve pt's ability to complete functional activity.    PT Home Exercise Plan [id number]  Pt does have access to a pool.  Will add to HEP going forward. Progress back strengthening as tolerated    Consulted and Agree with Plan of Care Patient             Patient will benefit from skilled therapeutic intervention in order to improve the following deficits and impairments:  Decreased range of motion, Difficulty walking, Decreased activity tolerance, Pain, Decreased mobility, Decreased strength  Visit Diagnosis: Spondylolisthesis, unspecified spinal region  Chronic bilateral low back pain with left-sided sciatica  Decreased ROM of lumbar spine  Difficulty  in walking, not elsewhere classified     Problem List Patient Active Problem List   Diagnosis Date Noted   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 12/03/2019   Vitamin B 12 deficiency 12/09/2017   Chronic post-traumatic stress disorder (PTSD) after military combat 06/03/2017   Major depression, recurrent, chronic ([place] 01/01/2017   Chronic pain syndrome 09/03/2016   Insomnia 07/15/2015   Cervical spondylosis without myelopathy 02/06/2015   Obesity (BMI 30-39.9) 03/15/2013   BPH (benign prostatic hyperplasia) 11/15/2012   Type 2 diabetes mellitus with diabetic polyneuropathy, without long-term current use of insulin ([place] 12/22/2010   Chronic pain of both knees 12/08/2008   Hyperlipidemia associated with type 2 diabetes mellitus ([place], on statin 08/23/2006   Gout 08/23/2006   Obstructive sleep apnea 08/23/2006   Allergic rhinitis 08/23/2006   Chronic low back pain 08/23/2006   Seizure disorder ([place] 08/23/2006   History of CVA (cerebrovascular accident) 08/23/2006    AGar PontoMS, PT 11/12/20 12:25 PM   [facility] [place] [address]Phone: [phone]  Fax:  [phone] Name: Mark DEMELOMRN: [id number] of Birth: [birth year] No charges were applied due to lack of authorization at the time of the visit.

## 2020-11-18 ENCOUNTER — Ambulatory Visit: Payer: No Typology Code available for payment source

## 2020-11-25 ENCOUNTER — Ambulatory Visit: Payer: No Typology Code available for payment source

## 2020-12-11 ENCOUNTER — Other Ambulatory Visit: Payer: Self-pay

## 2020-12-11 ENCOUNTER — Ambulatory Visit: Payer: No Typology Code available for payment source | Attending: Neurosurgery

## 2020-12-11 DIAGNOSIS — M5442 Lumbago with sciatica, left side: Secondary | ICD-10-CM | POA: Diagnosis present

## 2020-12-11 DIAGNOSIS — M5386 Other specified dorsopathies, lumbar region: Secondary | ICD-10-CM | POA: Diagnosis present

## 2020-12-11 DIAGNOSIS — R262 Difficulty in walking, not elsewhere classified: Secondary | ICD-10-CM | POA: Insufficient documentation

## 2020-12-11 DIAGNOSIS — M4310 Spondylolisthesis, site unspecified: Secondary | ICD-10-CM | POA: Insufficient documentation

## 2020-12-11 DIAGNOSIS — G8929 Other chronic pain: Secondary | ICD-10-CM | POA: Insufficient documentation

## 2020-12-11 NOTE — Therapy (Signed)
[facility] [address] [place], [place], [address] Phone: [phone]   Fax:  [phone]  Physical Therapy Treatment  Patient Details  Name: [name] MRN: [id number] Date of Birth: 1970/05/04 Referring Provider (PT): Julio Sicks, MD   Encounter Date: 12/11/2020   PT End of Session - 12/11/20 1857     Visit Number 18    Number of Visits 38    Date for PT Re-Evaluation 02/12/21    Authorization Type [facility] [employer]. 15 visits until 03/27/21    Authorization Time Period Re-assess FOTO on 23rd visits    Authorization - Visit Number 17    Authorization - Number of Visits 37    Progress Note Due on Visit 23    PT Start Time 0850    PT Stop Time 0932    PT Time Calculation (min) 42 min    Activity Tolerance Patient tolerated treatment well    Behavior During Therapy WFL for tasks assessed/performed             Past Medical History:  Diagnosis Date   Adrenal abnormality (HCC)    Allergy    Anxiety    Arthritis    Atypical chest pain    BPH (benign prostatic hypertrophy)    Complication of anesthesia    Hard time waking up after neck surgery   Depression    DM (diabetes mellitus) (HCC)    type II   GERD (gastroesophageal reflux disease)    "not since Gastric Bypass"   Gout    H/O: CVA (cardiovascular accident)    Hyperlipidemia    Knee pain, bilateral    LBP (low back pain)    OSA (obstructive sleep apnea)    Plantar fasciitis    PTSD (post-traumatic stress disorder)    Seizure disorder (HCC)    Stroke (HCC) 2001   some memory loss- math , science     Past Surgical History:  Procedure Laterality Date   CARPAL TUNNEL RELEASE Bilateral    COLONOSCOPY     ENDOSCOPIC PLANTAR FASCIOTOMY     ESOPHAGOGASTRODUODENOSCOPY     GASTRIC ROUX-EN-Y N/A 12/03/2019   Procedure: LAPAROSCOPIC ROUX-EN-Y GASTRIC BYPASS WITH UPPER ENDOSCOPY;  Surgeon: Luretha Murphy, MD;  Location: [facility];  Service:  General;  Laterality: N/A;   HEEL SPUR SURGERY Left    LUMBAR LAMINECTOMY/DECOMPRESSION MICRODISCECTOMY  2022   Dr. Jordan Likes   NECK SURGERY     Titanium Plate in Vertebrae   SHOULDER SURGERY Right    TRIGGER FINGER RELEASE Right 05/14/2015   Procedure: RIGHT THUMB TRIGGER RELEASE ;  Surgeon: Betha Loa, MD;  Location: [facility];  Service: Orthopedics;  Laterality: Right;    There were no vitals filed for this visit.   Subjective Assessment - 12/11/20 0854     Subjective Pt reports he is keeping up with his HEP. Today his low back pain is at a good level, but earlier in the week it was elevated relate to a trip which involved a bus ride with alot of bumps.    Diagnostic tests 04/03/20 lumbar xray:    FINDINGS:  AP and lateral view intraoperative fluoroscopic images of the  lumbosacral spine are submitted, 2 images total. The images  demonstrate bilateral pedicle screws at the L5 and S1 levels.  Vertical interconnecting rods were not present at the time the  images were taken. An L5-S1 interbody device is also present.  Overlying retractors.    Patient  Stated Goals Pt states wanting to be able to sleep through the night and enjoy grandkids. To tolerate sitting longer and to have greater flexibility.    Currently in Pain? Yes    Pain Score 6     Pain Location Back    Pain Orientation Right;Left;Posterior;Lower    Pain Descriptors / Indicators Aching;Sharp;Tightness    Pain Type Chronic pain    Pain Onset More than a month ago    Pain Frequency Constant    Aggravating Factors  prolonged sitting, squating, stepping up, walking    Pain Relieving Factors back brace, heat and cold, massage gun, topical analgesic                               [facility] Adult PT Treatment/Exercise - 12/11/20 0001       Exercises   Exercises Lumbar;Knee/Hip      Lumbar Exercises: Stretches   Hip Flexor Stretch Right;Left;2 reps;30 seconds   Thomas stretch in supine; Also completed  in prone, L and R LE, quad/hip flexor stretch 3x 30'   Other Lumbar Stretch Exercise trunk flexion and laterally c green TBall, 6x 30 sec      Lumbar Exercises: Aerobic   Nustep 8 mins; L9; UEs/LEs   completes at a good pace                      PT Short Term Goals - 09/09/20 1325       PT SHORT TERM GOAL #2   Title Ptwill voice understanding of measures to assist in the management and reduction of pain. 09/09/20, pt uses multiple techniques to help manage his pain: heat, cold, TENS, masssage gun tennis ball massase    Status Achieved    Target Date 09/09/20               PT Long Term Goals - 12/11/20 1901       PT LONG TERM GOAL #1   Title Increase trunk AROMs by at least 25% to improve pt's ability to complete functional activities. 09/09/20-Pt trunks ROMs have increased min to mod except L side bending which is the same    Baseline See flowsheets    Status On-going    Target Date 02/12/21      PT LONG TERM GOAL #2   Title Increase bilat knee and hip strength to at least 4+/5 strength for improved function ability. 09/09/20- R knee and hip have increased to 5/5 and 4+/5 respecively. L knee strength has increased to 4+/5 and the L hip is 4/5, improved but lss than set goal. L knee and hip strengths are impacted by pain.    Status On-going    Target Date 02/12/21      PT LONG TERM GOAL #3   Title Pt will report 50% improvement in his ability to sleep. 09/09/20- pt reports 25% improvement in his sleep.    Status On-going    Target Date 02/12/21      PT LONG TERM GOAL #4   Title Pt will report a decrease in his pain range with ADL to 4-7/10 for improved QOL. 09/09/20- 7-10/10 pain rating    Baseline 7-10/10    Status On-going    Target Date 02/12/21      PT LONG TERM GOAL #5   Title Pt's FOTO score will increase to he predicted value of 51% functional ability. 08/07/20- 45%. 10/16/20: 39%  Baseline 37%    Status On-going    Target Date 02/12/21      PT LONG  TERM GOAL #6   Title Pt will be Ind in a final HEP to maintain or progress achieved LOF    Status On-going    Target Date 02/12/21      PT LONG TERM GOAL #7   Title Improve 5xSTS time to less than 16 sec as indication of improved function.    Baseline 21.3 sec    Status On-going    Target Date 02/12/21                   Plan - 12/11/20 1900     Clinical Impression Statement Pt returns to PT after 1 month. Pt was seen for PT only 1 visit during this last certification period with waiting for VA authorization. Authorization has been received for continuation of PT with approval for 15 more visits. Pt today was provided to address lumbopelvic flexibility with emphasis on the quads and hip flexors with several different stretches completed to help determine if there was a stretch which was more effective. The pt found each stretch has it's pluses and minuses. PT will be continued to address flexibility and to progress strengthening to improve functional ability. TPDN will also be introduced as indicated to assist in the reduction of pain and progression of function.    Personal Factors and Comorbidities Time since onset of injury/illness/exacerbation;Comorbidity 1;Comorbidity 2;Comorbidity 3+;Finances    Comorbidities see medical Hx    Examination-Activity Limitations Lift;Squat;Stand;Locomotion Level;Transfers    Examination-Participation Restrictions Other   ADLs   Stability/Clinical Decision Making Evolving/Moderate complexity    Clinical Decision Making Moderate    Rehab Potential Good    PT Frequency 1x / week    PT Duration 6 weeks    PT Treatment/Interventions ADLs/Self Care Home Management;Aquatic Therapy;Electrical Stimulation;Iontophoresis 4mg /ml Dexamethasone;Moist Heat;Therapeutic exercise;Therapeutic activities;Functional mobility training;Stair training;Gait training;Patient/family education;Manual techniques;Spinal Manipulations;Taping;Dry needling;Balance  training;Neuromuscular re-education    PT Next Visit Plan Progress strengthening exs to improve pt's ability to complete functional ability.    PT Home Exercise Plan [id number]  Pt does have access to a pool.  Will add to HEP going forward. Progress back strengthening as tolerated    Consulted and Agree with Plan of Care Patient             Patient will benefit from skilled therapeutic intervention in order to improve the following deficits and impairments:  Decreased range of motion, Difficulty walking, Decreased activity tolerance, Pain, Decreased mobility, Decreased strength  Visit Diagnosis: Spondylolisthesis, unspecified spinal region  Chronic bilateral low back pain with left-sided sciatica  Decreased ROM of lumbar spine  Difficulty in walking, not elsewhere classified     Problem List Patient Active Problem List   Diagnosis Date Noted   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 12/03/2019   Vitamin B 12 deficiency 12/09/2017   Chronic post-traumatic stress disorder (PTSD) after military combat 06/03/2017   Major depression, recurrent, chronic (HCC) 01/01/2017   Chronic pain syndrome 09/03/2016   Insomnia 07/15/2015   Cervical spondylosis without myelopathy 02/06/2015   Obesity (BMI 30-39.9) 03/15/2013   BPH (benign prostatic hyperplasia) 11/15/2012   Type 2 diabetes mellitus with diabetic polyneuropathy, without long-term current use of insulin (HCC) 12/22/2010   Chronic pain of both knees 12/08/2008   Hyperlipidemia associated with type 2 diabetes mellitus (HCC), on statin 08/23/2006   Gout 08/23/2006   Obstructive sleep apnea 08/23/2006  Allergic rhinitis 08/23/2006   Chronic low back pain 08/23/2006   Seizure disorder (HCC) 08/23/2006   History of CVA (cerebrovascular accident) 08/23/2006   Joellyn Rued MS, PT 12/11/20 8:48 PM   [facility] [address] [place], [place], [address] Phone: [phone]   Fax:   [phone]  Name: [name] MRN: [id number] Date of Birth: [birth year]

## 2020-12-15 ENCOUNTER — Ambulatory Visit (INDEPENDENT_AMBULATORY_CARE_PROVIDER_SITE_OTHER): Payer: BC Managed Care – PPO | Admitting: Pulmonary Disease

## 2020-12-15 ENCOUNTER — Other Ambulatory Visit: Payer: Self-pay

## 2020-12-15 ENCOUNTER — Encounter: Payer: Self-pay | Admitting: Pulmonary Disease

## 2020-12-15 DIAGNOSIS — G4733 Obstructive sleep apnea (adult) (pediatric): Secondary | ICD-10-CM

## 2020-12-15 DIAGNOSIS — E669 Obesity, unspecified: Secondary | ICD-10-CM

## 2020-12-15 NOTE — Assessment & Plan Note (Signed)
He has done remarkably well with weight loss and repeat sleep study shows that AHI is significantly decreased to 6/hour which only puts him in the mild category. We reviewed cardiovascular risk factors of hyperlipidemia, diabetes is resolved and previous stroke. He continues to have loud snoring and for this reason and not feeling fully rested he would like to continue using CPAP and that is reasonable We will provide him with supplies as needed. I warned him about further weight gain after gastric bypass and we will reassess in a year's time  Weight loss encouraged, compliance with goal of at least 4-6 hrs every night is the expectation. Advised against medications with sedative side effects Cautioned against driving when sleepy - understanding that sleepiness will vary on a day to day basis

## 2020-12-15 NOTE — Assessment & Plan Note (Signed)
Traumatic weight loss after gastric bypass surgery and his OSA significantly improved. His hypersomnolence may be related to oxycodone

## 2020-12-15 NOTE — Progress Notes (Signed)
   Subjective:    Patient ID: [name], male    DOB: 08/07/1970, 50 y.o.   MRN: [id number]  HPI  50 yo obese, diabetic for follow-up of OSA. -diagnosed in 2006   New CPAP since 2016  PMH - CVA 2001 Lt weakness   gastric bypass 2021 and lost 70 pounds >> current 219 lbs  His diabetes has resolved, he is off metformin.  His weight bottomed at 198 but he has gained some muscle weight back up to his current weight of 2 1 9  pounds. He still using his CPAP machine. He remains on a statin  We reviewed his sleep study today Overall does not feel fully rested, he takes oxycodone 3 times daily this makes him sleepy by 10 AM  Significant tests/ events reviewed Split study 08/2020 AHI 6/hour, TST 350 minutes, lowest desaturation 86%  NPSG 2006:  AHI 78/hr Auto optimized to 13-14cm.  autoset 01/2011:  13cm  Review of Systems neg for any significant sore throat, dysphagia, itching, sneezing, nasal congestion or excess/ purulent secretions, fever, chills, sweats, unintended wt loss, pleuritic or exertional cp, hempoptysis, orthopnea pnd or change in chronic leg swelling. Also denies presyncope, palpitations, heartburn, abdominal pain, nausea, vomiting, diarrhea or change in bowel or urinary habits, dysuria,hematuria, rash, arthralgias, visual complaints, headache, numbness weakness or ataxia.     Objective:   Physical Exam  Gen. Pleasant, obese, in no distress ENT - no lesions, no post nasal drip Neck: No JVD, no thyromegaly, no carotid bruits Lungs: no use of accessory muscles, no dullness to percussion, decreased without rales or rhonchi  Cardiovascular: Rhythm regular, heart sounds  normal, no murmurs or gallops, no peripheral edema Musculoskeletal: No deformities, no cyanosis or clubbing , no tremors       Assessment & Plan:

## 2020-12-15 NOTE — Patient Instructions (Signed)
OSA is mild & improved remarkably Congratulations on your weight loss

## 2020-12-18 ENCOUNTER — Ambulatory Visit
Admission: RE | Admit: 2020-12-18 | Discharge: 2020-12-18 | Disposition: A | Discharge status: Home / Self Care | Payer: BC Managed Care – PPO | Source: Ambulatory Visit | Attending: Surgery | Admitting: Surgery

## 2020-12-18 ENCOUNTER — Other Ambulatory Visit: Payer: Self-pay | Admitting: *Deleted

## 2020-12-18 DIAGNOSIS — G8929 Other chronic pain: Secondary | ICD-10-CM | POA: Diagnosis not present

## 2020-12-18 DIAGNOSIS — R102 Pelvic and perineal pain: Secondary | ICD-10-CM | POA: Diagnosis not present

## 2020-12-18 DIAGNOSIS — Z9884 Bariatric surgery status: Secondary | ICD-10-CM | POA: Diagnosis not present

## 2020-12-18 DIAGNOSIS — M25552 Pain in left hip: Secondary | ICD-10-CM

## 2020-12-22 ENCOUNTER — Ambulatory Visit (AMBULATORY_SURGERY_CENTER): Payer: Self-pay | Admitting: *Deleted

## 2020-12-22 VITALS — Ht 70.0 in | Wt 215.0 lb

## 2020-12-22 DIAGNOSIS — Z1211 Encounter for screening for malignant neoplasm of colon: Secondary | ICD-10-CM

## 2020-12-22 MED ORDER — PLENVU 140 G PO SOLR: 1.0000 | Freq: Once | ORAL | 0 refills | Status: AC

## 2020-12-22 NOTE — Progress Notes (Signed)
Virtual pre visit conducted in person.  Plenvu coupon provided.     No egg or soy allergy known to patient  No issues known to pt with past sedation with any surgeries or procedures Patient denies ever being told they had issues or difficulty with intubation  No FH of Malignant Hyperthermia Pt is not on diet pills Pt is not on  home 02  Pt is not on blood thinners  Pt denies issues with constipation  No A fib or A flutter   Code to Pharmacy and  NO PA's for preps discussed with pt In PV today  Discussed with pt there will be an out-of-pocket cost for prep and that varies from $0 to 70 +  dollars - pt verbalized understanding   Due to the COVID-19 pandemic we are asking patients to follow certain guidelines in PV and the [facility]   Pt aware of COVID protocols and [facility] guidelines

## 2020-12-30 ENCOUNTER — Encounter: Payer: Self-pay | Admitting: Internal Medicine

## 2020-12-30 ENCOUNTER — Other Ambulatory Visit: Payer: Self-pay

## 2020-12-30 ENCOUNTER — Ambulatory Visit: Payer: No Typology Code available for payment source

## 2020-12-30 DIAGNOSIS — M4310 Spondylolisthesis, site unspecified: Secondary | ICD-10-CM

## 2020-12-30 DIAGNOSIS — R262 Difficulty in walking, not elsewhere classified: Secondary | ICD-10-CM

## 2020-12-30 DIAGNOSIS — G8929 Other chronic pain: Secondary | ICD-10-CM

## 2020-12-30 DIAGNOSIS — M5386 Other specified dorsopathies, lumbar region: Secondary | ICD-10-CM

## 2020-12-31 NOTE — Therapy (Signed)
[facility] Outpatient Rehabilitation [facility] [address] [place], [place], [address] Phone: [phone]   Fax:  [phone]  Physical Therapy Treatment  Patient Details  Name: [name] MRN: [id number] Date of Birth: Mar 11, 1970 Referring Provider (PT): Julio Sicks, MD   Encounter Date: 12/30/2020   PT End of Session - 12/31/20 1316     Visit Number 19    Number of Visits 38    Date for PT Re-Evaluation 02/12/21    Authorization Type [employer]. 15 visits until 03/27/21    Authorization Time Period Re-assess FOTO on 23rd visits    Authorization - Visit Number 18    Authorization - Number of Visits 37    Progress Note Due on Visit 23    PT Start Time [phone]    PT Stop Time 1020    PT Time Calculation (min) 42 min    Activity Tolerance Patient tolerated treatment well    Behavior During Therapy WFL for tasks assessed/performed             Past Medical History:  Diagnosis Date   Adrenal abnormality (HCC)    Allergy    Anxiety    Arthritis    Atypical chest pain    BPH (benign prostatic hypertrophy)    Complication of anesthesia    Hard time waking up after neck surgery   Depression    GERD (gastroesophageal reflux disease)    "not since Gastric Bypass"   Gout    H/O: CVA (cardiovascular accident)    Hyperlipidemia    Knee pain, bilateral    LBP (low back pain)    Neuromuscular disorder (HCC)    OSA (obstructive sleep apnea)    Plantar fasciitis    PTSD (post-traumatic stress disorder)    Seizure disorder (HCC)    Sleep apnea    Stroke (HCC) 2001   some memory loss- math , science     Past Surgical History:  Procedure Laterality Date   CARPAL TUNNEL RELEASE Bilateral    COLONOSCOPY     ENDOSCOPIC PLANTAR FASCIOTOMY     ESOPHAGOGASTRODUODENOSCOPY     GASTRIC ROUX-EN-Y N/A 12/03/2019   Procedure: LAPAROSCOPIC ROUX-EN-Y GASTRIC BYPASS WITH UPPER ENDOSCOPY;  Surgeon: Luretha Murphy, MD;  Location: [facility];  Service:  General;  Laterality: N/A;   HEEL SPUR SURGERY Left    LUMBAR LAMINECTOMY/DECOMPRESSION MICRODISCECTOMY  2022   Dr. Jordan Likes   NECK SURGERY     Titanium Plate in Vertebrae   SHOULDER SURGERY Right    TRIGGER FINGER RELEASE Right 05/14/2015   Procedure: RIGHT THUMB TRIGGER RELEASE ;  Surgeon: Betha Loa, MD;  Location: [facility];  Service: Orthopedics;  Laterality: Right;   UPPER GASTROINTESTINAL ENDOSCOPY      There were no vitals filed for this visit.   Subjective Assessment - 12/30/20 1013     Subjective Pt reports his low back pain has been normally around 6/10 with some days up to 8/10    Diagnostic tests 04/03/20 lumbar xray:    FINDINGS:  AP and lateral view intraoperative fluoroscopic images of the  lumbosacral spine are submitted, 2 images total. The images  demonstrate bilateral pedicle screws at the L5 and S1 levels.  Vertical interconnecting rods were not present at the time the  images were taken. An L5-S1 interbody device is also present.  Overlying retractors.    Patient Stated Goals Pt states wanting to be able to sleep through the night and enjoy grandkids.  To tolerate sitting longer and to have greater flexibility.    Currently in Pain? Yes    Pain Score 6     Pain Location Back    Pain Orientation Right;Left;Posterior;Lower    Pain Descriptors / Indicators Aching;Sharp;Tightness    Pain Type Chronic pain    Pain Onset More than a month ago    Pain Frequency Constant             Manual: - Completed STM to the L lumbar paraspinals L1-L5 with TP release provided to identified areas. TPM was f/u by [facility]  Trigger Point Dry Needling Treatment: Pre-treatment instruction: Patient instructed on dry needling rationale, procedures, and possible side effects including pain during treatment (achy,cramping feeling), bruising, drop of blood, lightheadedness, nausea, sweating. Patient Consent Given: Yes Education handout provided: Yes Muscles treated: L lumbar  multifidi, iliocostalis L2, L3  Needle size and number: .30x73mm x 1 and .30x16mm x 1 Electrical stimulation performed: No Parameters: N/A Treatment response/outcome: Twitch response elicited and Palpable decrease in muscle tension Post-treatment instructions: Patient instructed to expect possible mild to moderate muscle soreness later today and/or tomorrow. Patient instructed in methods to reduce muscle soreness and to continue prescribed HEP. If patient was dry needled over the lung field, patient was instructed on signs and symptoms of pneumothorax and, however unlikely, to see immediate medical attention should they occur. Patient was also educated on signs and symptoms of infection and to seek medical attention should they occur. Patient verbalized understanding of these instructions and education.                    [facility] Adult PT Treatment/Exercise - 12/31/20 0001       Exercises   Exercises Lumbar;Knee/Hip      Lumbar Exercises: Stretches   Other Lumbar Stretch Exercise trunk flexion forward and laterally c green TBall, 6x 30 sec      Lumbar Exercises: Standing   Row Both;15 reps   2 sets   Theraband Level (Row) Level 4 (Blue)    Shoulder Extension Both;15 reps   2 sets   Theraband Level (Shoulder Extension) Level 4 (Blue)    Other Standing Lumbar Exercises Paloff press; 2x10, each direction. BTB      Lumbar Exercises: Seated   Sit to Stand Limitations 2x10, 25lbs. proper technique                       PT Short Term Goals - 09/09/20 1325       PT SHORT TERM GOAL #2   Title Ptwill voice understanding of measures to assist in the management and reduction of pain. 09/09/20, pt uses multiple techniques to help manage his pain: heat, cold, TENS, masssage gun tennis ball massase    Status Achieved    Target Date 09/09/20               PT Long Term Goals - 12/11/20 1901       PT LONG TERM GOAL #1   Title Increase trunk AROMs by at least 25%  to improve pt's ability to complete functional activities. 09/09/20-Pt trunks ROMs have increased min to mod except L side bending which is the same    Baseline See flowsheets    Status On-going    Target Date 02/12/21      PT LONG TERM GOAL #2   Title Increase bilat knee and hip strength to at least 4+/5 strength for improved function ability. 09/09/20- R  knee and hip have increased to 5/5 and 4+/5 respecively. L knee strength has increased to 4+/5 and the L hip is 4/5, improved but lss than set goal. L knee and hip strengths are impacted by pain.    Status On-going    Target Date 02/12/21      PT LONG TERM GOAL #3   Title Pt will report 50% improvement in his ability to sleep. 09/09/20- pt reports 25% improvement in his sleep.    Status On-going    Target Date 02/12/21      PT LONG TERM GOAL #4   Title Pt will report a decrease in his pain range with ADL to 4-7/10 for improved QOL. 09/09/20- 7-10/10 pain rating    Baseline 7-10/10    Status On-going    Target Date 02/12/21      PT LONG TERM GOAL #5   Title Pt's FOTO score will increase to he predicted value of 51% functional ability. 08/07/20- 45%. 10/16/20: 39%    Baseline 37%    Status On-going    Target Date 02/12/21      PT LONG TERM GOAL #6   Title Pt will be Ind in a final HEP to maintain or progress achieved LOF    Status On-going    Target Date 02/12/21      PT LONG TERM GOAL #7   Title Improve 5xSTS time to less than 16 sec as indication of improved function.    Baseline 21.3 sec    Status On-going    Target Date 02/12/21                   Plan - 12/31/20 1317     Clinical Impression Statement Following skilled STM, TP release and skilled palpation, PT was provided for TPDN to the L lumber paraspinals at the L2 and L3. TPDN was followed by lumbarpelvic/hip flexibility and strengthening exs. Following DN, the pt reported his L low back ROM was improved. PT tolerated the session without adverse effects. Assess  response to TPDN the next session.    Personal Factors and Comorbidities Time since onset of injury/illness/exacerbation;Comorbidity 1;Comorbidity 2;Comorbidity 3+;Finances    Comorbidities see medical Hx    Examination-Activity Limitations Lift;Squat;Stand;Locomotion Level;Transfers    Examination-Participation Restrictions Other   ADLs   Stability/Clinical Decision Making Evolving/Moderate complexity    Clinical Decision Making Moderate    Rehab Potential Good    PT Frequency 1x / week    PT Duration 6 weeks    PT Treatment/Interventions ADLs/Self Care Home Management;Aquatic Therapy;Electrical Stimulation;Iontophoresis 4mg /ml Dexamethasone;Moist Heat;Therapeutic exercise;Therapeutic activities;Functional mobility training;Stair training;Gait training;Patient/family education;Manual techniques;Spinal Manipulations;Taping;Dry needling;Balance training;Neuromuscular re-education    PT Next Visit Plan Progress strengthening exs to improve pt's ability to complete functional ability. Assess response to TPDN L L2 and L3 multifidi and iliocostalis.    PT Home Exercise Plan [id number]  Pt does have access to a pool.  Will add to HEP going forward. Progress back strengthening as tolerated    Consulted and Agree with Plan of Care Patient             Patient will benefit from skilled therapeutic intervention in order to improve the following deficits and impairments:  Decreased range of motion, Difficulty walking, Decreased activity tolerance, Pain, Decreased mobility, Decreased strength  Visit Diagnosis: Spondylolisthesis, unspecified spinal region  Chronic bilateral low back pain with left-sided sciatica  Decreased ROM of lumbar spine  Difficulty in walking, not elsewhere classified     Problem List  Patient Active Problem List   Diagnosis Date Noted   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 12/03/2019   Vitamin B 12 deficiency 12/09/2017   Chronic post-traumatic stress disorder (PTSD)  after military combat 06/03/2017   Major depression, recurrent, chronic (HCC) 01/01/2017   Chronic pain syndrome 09/03/2016   Insomnia 07/15/2015   Cervical spondylosis without myelopathy 02/06/2015   Obesity (BMI 30-39.9) 03/15/2013   BPH (benign prostatic hyperplasia) 11/15/2012   Type 2 diabetes mellitus with diabetic polyneuropathy, without long-term current use of insulin (HCC) 12/22/2010   Chronic pain of both knees 12/08/2008   Hyperlipidemia associated with type 2 diabetes mellitus (HCC), on statin 08/23/2006   Gout 08/23/2006   Obstructive sleep apnea 08/23/2006   Allergic rhinitis 08/23/2006   Chronic low back pain 08/23/2006   Seizure disorder (HCC) 08/23/2006   History of CVA (cerebrovascular accident) 08/23/2006    Joellyn Rued MS, PT 12/31/20 2:09 PM   [facility] Outpatient Rehabilitation [facility] [address] [place], [place], [address] Phone: [phone]   Fax:  [phone]  Name: MODESTO GANOE MRN: [id number] Date of Birth: 1971-01-06

## 2021-01-01 ENCOUNTER — Ambulatory Visit: Payer: No Typology Code available for payment source

## 2021-01-06 ENCOUNTER — Ambulatory Visit: Payer: No Typology Code available for payment source | Attending: Neurosurgery

## 2021-01-06 ENCOUNTER — Other Ambulatory Visit: Payer: Self-pay

## 2021-01-06 DIAGNOSIS — R262 Difficulty in walking, not elsewhere classified: Secondary | ICD-10-CM | POA: Insufficient documentation

## 2021-01-06 DIAGNOSIS — G8929 Other chronic pain: Secondary | ICD-10-CM | POA: Insufficient documentation

## 2021-01-06 DIAGNOSIS — M5442 Lumbago with sciatica, left side: Secondary | ICD-10-CM | POA: Insufficient documentation

## 2021-01-06 DIAGNOSIS — M4310 Spondylolisthesis, site unspecified: Secondary | ICD-10-CM | POA: Insufficient documentation

## 2021-01-06 DIAGNOSIS — M5386 Other specified dorsopathies, lumbar region: Secondary | ICD-10-CM | POA: Insufficient documentation

## 2021-01-06 NOTE — Therapy (Signed)
[facility] Outpatient Rehabilitation [facility] [address] [place], [place], [address] Phone: [phone]   Fax:  [phone]  Physical Therapy Treatment/Progress Note  Patient Details  Name: [name] MRN: [id number] Date of Birth: 1970/12/10 Referring Provider (PT): Julio Sicks, MD  Progress Note Reporting Period 08/21/20 to 01/06/21  See note below for Objective Data and Assessment of Progress/Goals.   Encounter Date: 01/06/2021   PT End of Session - 01/06/21 0849     Visit Number 20    Number of Visits 38    Date for PT Re-Evaluation 02/12/21    Authorization Type [employer]. 15 visits until 03/27/21    Authorization Time Period Re-assess FOTO on 23rd visits    Authorization - Visit Number 19    Authorization - Number of Visits 37    Progress Note Due on Visit 23    PT Start Time 0800    PT Stop Time 0846    PT Time Calculation (min) 46 min    Activity Tolerance Patient tolerated treatment well    Behavior During Therapy WFL for tasks assessed/performed             Past Medical History:  Diagnosis Date   Adrenal abnormality (HCC)    Allergy    Anxiety    Arthritis    Atypical chest pain    BPH (benign prostatic hypertrophy)    Complication of anesthesia    Hard time waking up after neck surgery   Depression    GERD (gastroesophageal reflux disease)    "not since Gastric Bypass"   Gout    H/O: CVA (cardiovascular accident)    Hyperlipidemia    Knee pain, bilateral    LBP (low back pain)    Neuromuscular disorder (HCC)    OSA (obstructive sleep apnea)    Plantar fasciitis    PTSD (post-traumatic stress disorder)    Seizure disorder (HCC)    Sleep apnea    Stroke (HCC) 2001   some memory loss- math , science     Past Surgical History:  Procedure Laterality Date   CARPAL TUNNEL RELEASE Bilateral    COLONOSCOPY     ENDOSCOPIC PLANTAR FASCIOTOMY     ESOPHAGOGASTRODUODENOSCOPY     GASTRIC ROUX-EN-Y N/A  12/03/2019   Procedure: LAPAROSCOPIC ROUX-EN-Y GASTRIC BYPASS WITH UPPER ENDOSCOPY;  Surgeon: Luretha Murphy, MD;  Location: [facility];  Service: General;  Laterality: N/A;   HEEL SPUR SURGERY Left    LUMBAR LAMINECTOMY/DECOMPRESSION MICRODISCECTOMY  2022   Dr. Jordan Likes   NECK SURGERY     Titanium Plate in Vertebrae   SHOULDER SURGERY Right    TRIGGER FINGER RELEASE Right 05/14/2015   Procedure: RIGHT THUMB TRIGGER RELEASE ;  Surgeon: Betha Loa, MD;  Location: [facility];  Service: Orthopedics;  Laterality: Right;   UPPER GASTROINTESTINAL ENDOSCOPY      There were no vitals filed for this visit.      Subjective Assessment - 01/06/21 1450     Subjective Pt reports increased L low back flexibility after TPDN during the last session. After lifting 5 gallon water tanks yesterday, pt reports his low back is hurting more than usual    Diagnostic tests 04/03/20 lumbar xray:    FINDINGS:  AP and lateral view intraoperative fluoroscopic images of the  lumbosacral spine are submitted, 2 images total. The images  demonstrate bilateral pedicle screws at the L5 and S1 levels.  Vertical interconnecting rods were not present at the  time the  images were taken. An L5-S1 interbody device is also present.  Overlying retractors.    Patient Stated Goals Pt states wanting to be able to sleep through the night and enjoy grandkids. To tolerate sitting longer and to have greater flexibility.    Currently in Pain? Yes    Pain Score 7     Pain Location Back    Pain Orientation Right;Left;Posterior;Lower    Pain Descriptors / Indicators Aching;Sharp;Tightness    Pain Type Chronic pain    Pain Onset More than a month ago    Pain Frequency Constant    Aggravating Factors  prolonged sitting, squating, stepping up, walking, TENs    Pain Relieving Factors back brace, heat and cold, massage gun, topical analgesic, TENs    Multiple Pain Sites No             Manual: - Completed STM to the L lumbar  paraspinals L1-L5 with TP release provided to identified areas. TPM was f/u by [facility]   Trigger Point Dry Needling Treatment: Pre-treatment instruction: Patient instructed on dry needling rationale, procedures, and possible side effects including pain during treatment (achy,cramping feeling), bruising, drop of blood, lightheadedness, nausea, sweating. Patient Consent Given: Yes Education handout provided: Provided with previous PT session Muscles treated: L lumbar multifidi, iliocostalis L2, L3  Needle size and number: .30x85mm x 1  Electrical stimulation performed: No Parameters: N/A Treatment response/outcome: Twitch response elicited and Palpable decrease in muscle tension Post-treatment instructions: Patient instructed to expect possible mild to moderate muscle soreness later today and/or tomorrow. Patient instructed in methods to reduce muscle soreness and to continue prescribed HEP. If patient was dry needled over the lung field, patient was instructed on signs and symptoms of pneumothorax and, however unlikely, to see immediate medical attention should they occur. Patient was also educated on signs and symptoms of infection and to seek medical attention should they occur. Patient verbalized understanding of these instructions and education.                     [facility] Adult PT Treatment/Exercise - 01/06/21 0001       Exercises   Exercises Lumbar;Knee/Hip;Shoulder      Lumbar Exercises: Stretches   Other Lumbar Stretch Exercise trunk flexion forward and laterally c green TBall, 6x 30 sec      Lumbar Exercises: Aerobic   Nustep 5 mins; L8; UEs/LEs   completes at a good pace     Lumbar Exercises: Standing   Functional Squats 10 reps   2 sets   Functional Squats Limitations 25#    Other Standing Lumbar Exercises Paloff side steps, 75ft, 5x, GTB both directions      Shoulder Exercises: ROM/Strengthening   Lat Pull 10 reps   2 sets   Lat Pull Limitations 45#, VC for ab  engagement    Cybex Press 10 reps   2 sets   Cybex Press Limitations 35# then 55#, VC for ab engagement    Cybex Row 10 reps   2 sets   Cybex Row Limitations 65#, VC for ab engagement                       PT Short Term Goals - 09/09/20 1325       PT SHORT TERM GOAL #2   Title Ptwill voice understanding of measures to assist in the management and reduction of pain. 09/09/20, pt uses multiple techniques to help manage his pain: heat,  cold, TENS, masssage gun tennis ball massase    Status Achieved    Target Date 09/09/20               PT Long Term Goals - 12/11/20 1901       PT LONG TERM GOAL #1   Title Increase trunk AROMs by at least 25% to improve pt's ability to complete functional activities. 09/09/20-Pt trunks ROMs have increased min to mod except L side bending which is the same    Baseline See flowsheets    Status On-going    Target Date 02/12/21      PT LONG TERM GOAL #2   Title Increase bilat knee and hip strength to at least 4+/5 strength for improved function ability. 09/09/20- R knee and hip have increased to 5/5 and 4+/5 respecively. L knee strength has increased to 4+/5 and the L hip is 4/5, improved but lss than set goal. L knee and hip strengths are impacted by pain.    Status On-going    Target Date 02/12/21      PT LONG TERM GOAL #3   Title Pt will report 50% improvement in his ability to sleep. 09/09/20- pt reports 25% improvement in his sleep.    Status On-going    Target Date 02/12/21      PT LONG TERM GOAL #4   Title Pt will report a decrease in his pain range with ADL to 4-7/10 for improved QOL. 09/09/20- 7-10/10 pain rating    Baseline 7-10/10    Status On-going    Target Date 02/12/21      PT LONG TERM GOAL #5   Title Pt's FOTO score will increase to he predicted value of 51% functional ability. 08/07/20- 45%. 10/16/20: 39%    Baseline 37%    Status On-going    Target Date 02/12/21      PT LONG TERM GOAL #6   Title Pt will be Ind in  a final HEP to maintain or progress achieved LOF    Status On-going    Target Date 02/12/21      PT LONG TERM GOAL #7   Title Improve 5xSTS time to less than 16 sec as indication of improved function.    Baseline 21.3 sec    Status On-going    Target Date 02/12/21                   Plan - 01/06/21 1643     Clinical Impression Statement Pt reported improved flexibilty of his L low back initially following the TPDN received the last PT session. STM and TPDN were completed today f/b UB/trunk strengthening exs with core stability. Utilizing the weight machines for strengthening, pt was able to tolerate a good level of resistanace and demonstrated appropriate abdominal/core activation for low back stability during these ther exs. Pt tolerated today's session without adverse effects.    Personal Factors and Comorbidities Time since onset of injury/illness/exacerbation;Comorbidity 1;Comorbidity 2;Comorbidity 3+;Finances    Comorbidities see medical Hx    Examination-Activity Limitations Lift;Squat;Stand;Locomotion Level;Transfers    Examination-Participation Restrictions Other    Stability/Clinical Decision Making Evolving/Moderate complexity    Clinical Decision Making Moderate    Rehab Potential Good    PT Frequency 1x / week    PT Duration 6 weeks    PT Treatment/Interventions ADLs/Self Care Home Management;Aquatic Therapy;Electrical Stimulation;Iontophoresis 4mg /ml Dexamethasone;Moist Heat;Therapeutic exercise;Therapeutic activities;Functional mobility training;Stair training;Gait training;Patient/family education;Manual techniques;Spinal Manipulations;Taping;Dry needling;Balance training;Neuromuscular re-education    PT Next Visit Plan Progress  strengthening exs to improve pt's ability to complete functional ability. Assess response to TPDN L L2 and L3 multifidi and iliocostalis.    PT Home Exercise Plan [id number]    Consulted and Agree with Plan of Care Patient              Patient will benefit from skilled therapeutic intervention in order to improve the following deficits and impairments:  Decreased range of motion, Difficulty walking, Decreased activity tolerance, Pain, Decreased mobility, Decreased strength  Visit Diagnosis: Spondylolisthesis, unspecified spinal region  Chronic bilateral low back pain with left-sided sciatica  Decreased ROM of lumbar spine  Difficulty in walking, not elsewhere classified     Problem List Patient Active Problem List   Diagnosis Date Noted   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 12/03/2019   Vitamin B 12 deficiency 12/09/2017   Chronic post-traumatic stress disorder (PTSD) after military combat 06/03/2017   Major depression, recurrent, chronic (HCC) 01/01/2017   Chronic pain syndrome 09/03/2016   Insomnia 07/15/2015   Cervical spondylosis without myelopathy 02/06/2015   Obesity (BMI 30-39.9) 03/15/2013   BPH (benign prostatic hyperplasia) 11/15/2012   Type 2 diabetes mellitus with diabetic polyneuropathy, without long-term current use of insulin (HCC) 12/22/2010   Chronic pain of both knees 12/08/2008   Hyperlipidemia associated with type 2 diabetes mellitus (HCC), on statin 08/23/2006   Gout 08/23/2006   Obstructive sleep apnea 08/23/2006   Allergic rhinitis 08/23/2006   Chronic low back pain 08/23/2006   Seizure disorder (HCC) 08/23/2006   History of CVA (cerebrovascular accident) 08/23/2006    Joellyn Rued MS, PT 01/06/21 5:08 PM   [facility] Outpatient Rehabilitation [facility] [address] [place], [place], [address] Phone: [phone]   Fax:  [phone]  Name: [name] MRN: [id number] Date of Birth: 09-Apr-1970

## 2021-01-07 ENCOUNTER — Ambulatory Visit (AMBULATORY_SURGERY_CENTER): Payer: BC Managed Care – PPO | Admitting: Internal Medicine

## 2021-01-07 ENCOUNTER — Encounter: Payer: Self-pay | Admitting: Internal Medicine

## 2021-01-07 VITALS — BP 101/65 | HR 65 | Temp 96.9°F | Resp 11 | Ht 70.0 in | Wt 215.0 lb

## 2021-01-07 DIAGNOSIS — Z1211 Encounter for screening for malignant neoplasm of colon: Secondary | ICD-10-CM | POA: Diagnosis not present

## 2021-01-07 MED ORDER — SODIUM CHLORIDE 0.9 % IV SOLN: 500.0000 mL | Freq: Once | INTRAVENOUS | Status: DC

## 2021-01-07 NOTE — Op Note (Signed)
[facility] Patient Name: [name] Procedure Date: 01/07/2021 9:06 AM MRN: [id number] Endoscopist: Wilhemina Bonito. Marina Goodell , MD Age: 50 Referring MD:  Date of Birth: 04-19-1970 Gender: Male Account #: [id number] Procedure:                Colonoscopy Indications:              Screening for colorectal malignant neoplasm Medicines:                Monitored Anesthesia Care Procedure:                Pre-Anesthesia Assessment:                           - Prior to the procedure, a History and Physical                            was performed, and patient medications and                            allergies were reviewed. The patient's tolerance of                            previous anesthesia was also reviewed. The risks                            and benefits of the procedure and the sedation                            options and risks were discussed with the patient.                            All questions were answered, and informed consent                            was obtained. Prior Anticoagulants: The patient has                            taken no previous anticoagulant or antiplatelet                            agents. ASA Grade Assessment: II - A patient with                            mild systemic disease. After reviewing the risks                            and benefits, the patient was deemed in                            satisfactory condition to undergo the procedure.                           After obtaining informed consent, the colonoscope  was passed under direct vision. Throughout the                            procedure, the patient's blood pressure, pulse, and                            oxygen saturations were monitored continuously. The                            Olympus CF-HQ190L (Serial# [id number]) Colonoscope was                            introduced through the anus and advanced to the the                            cecum,  identified by appendiceal orifice and                            ileocecal valve. The ileocecal valve, appendiceal                            orifice, and rectum were photographed. The quality                            of the bowel preparation was excellent. The                            colonoscopy was performed without difficulty. The                            patient tolerated the procedure well. The bowel                            preparation used was SUPREP via split dose                            instruction. Scope In: 9:23:08 AM Scope Out: 9:37:22 AM Scope Withdrawal Time: 0 hours 9 minutes 51 seconds  Total Procedure Duration: 0 hours 14 minutes 14 seconds  Findings:                 Multiple diverticula were found in the sigmoid                            colon.                           The exam was otherwise without abnormality on                            direct and retroflexion views. Complications:            No immediate complications. Estimated blood loss:                            None. Estimated Blood Loss:  Estimated blood loss: none. Impression:               - Diverticulosis in the sigmoid colon.                           - The examination was otherwise normal on direct                            and retroflexion views.                           - No specimens collected. Recommendation:           - Repeat colonoscopy in 10 years for screening                            purposes.                           - Patient has a contact number available for                            emergencies. The signs and symptoms of potential                            delayed complications were discussed with the                            patient. Return to normal activities tomorrow.                            Written discharge instructions were provided to the                            patient.                           - Resume previous diet.                            - Continue present medications. Wilhemina Bonito. Marina Goodell, MD 01/07/2021 9:43:16 AM This report has been signed electronically.

## 2021-01-07 NOTE — Progress Notes (Signed)
A and O x3. Report to RN. Tolerated MAC anesthesia well. 

## 2021-01-07 NOTE — Patient Instructions (Signed)
Handout on Diverticulosis given. ° °YOU HAD AN ENDOSCOPIC PROCEDURE TODAY AT THE [facility]:   Refer to the procedure report that was given to you for any specific questions about what was found during the examination.  If the procedure report does not answer your questions, please call your gastroenterologist to clarify.  If you requested that your care partner not be given the details of your procedure findings, then the procedure report has been included in a sealed envelope for you to review at your convenience later. ° °YOU SHOULD EXPECT: Some feelings of bloating in the abdomen. Passage of more gas than usual.  Walking can help get rid of the air that was put into your GI tract during the procedure and reduce the bloating. If you had a lower endoscopy (such as a colonoscopy or flexible sigmoidoscopy) you may notice spotting of blood in your stool or on the toilet paper. If you underwent a bowel prep for your procedure, you may not have a normal bowel movement for a few days. ° °Please Note:  You might notice some irritation and congestion in your nose or some drainage.  This is from the oxygen used during your procedure.  There is no need for concern and it should clear up in a day or so. ° °SYMPTOMS TO REPORT IMMEDIATELY: ° °Following lower endoscopy (colonoscopy or flexible sigmoidoscopy): ° Excessive amounts of blood in the stool ° Significant tenderness or worsening of abdominal pains ° Swelling of the abdomen that is new, acute ° Fever of 100°F or higher ° °For urgent or emergent issues, a gastroenterologist can be reached at any hour by calling [phone]. °Do not use MyChart messaging for urgent concerns.  ° ° °DIET:  We do recommend a small meal at first, but then you may proceed to your regular diet.  Drink plenty of fluids but you should avoid alcoholic beverages for 24 hours. ° °ACTIVITY:  You should plan to take it easy for the rest of today and you should NOT DRIVE or use heavy  machinery until tomorrow (because of the sedation medicines used during the test).   ° °FOLLOW UP: °Our staff will call the number listed on your records 48-72 hours following your procedure to check on you and address any questions or concerns that you may have regarding the information given to you following your procedure. If we do not reach you, we will leave a message.  We will attempt to reach you two times.  During this call, we will ask if you have developed any symptoms of COVID 19. If you develop any symptoms (ie: fever, flu-like symptoms, shortness of breath, cough etc.) before then, please call [phone].  If you test positive for Covid 19 in the 2 weeks post procedure, please call and report this information to us.   ° °If any biopsies were taken you will be contacted by phone or by letter within the next 1-3 weeks.  Please call us at [phone] if you have not heard about the biopsies in 3 weeks.  ° ° °SIGNATURES/CONFIDENTIALITY: °You and/or your care partner have signed paperwork which will be entered into your electronic medical record.  These signatures attest to the fact that that the information above on your After Visit Summary has been reviewed and is understood.  Full responsibility of the confidentiality of this discharge information lies with you and/or your care-partner.  °

## 2021-01-08 ENCOUNTER — Ambulatory Visit: Payer: No Typology Code available for payment source

## 2021-01-08 ENCOUNTER — Telehealth: Payer: Self-pay

## 2021-01-08 NOTE — Telephone Encounter (Signed)
Spoke with pt who reports he forfot about the appt. Reminded pt of his upcoming PT appt.

## 2021-01-11 ENCOUNTER — Telehealth: Payer: Self-pay

## 2021-01-11 NOTE — Telephone Encounter (Signed)
Attempted to reach pt. With follow-up call following endoscopic procedure 01/07/21.  LM on pt. Voice mail to call if he has any questions or concerns.

## 2021-01-11 NOTE — Telephone Encounter (Signed)
Attempted f/u call back. No answer, left VM. 

## 2021-01-13 ENCOUNTER — Ambulatory Visit: Payer: No Typology Code available for payment source

## 2021-01-13 ENCOUNTER — Other Ambulatory Visit: Payer: Self-pay

## 2021-01-13 DIAGNOSIS — M4310 Spondylolisthesis, site unspecified: Secondary | ICD-10-CM | POA: Diagnosis not present

## 2021-01-13 DIAGNOSIS — M5386 Other specified dorsopathies, lumbar region: Secondary | ICD-10-CM

## 2021-01-13 DIAGNOSIS — R262 Difficulty in walking, not elsewhere classified: Secondary | ICD-10-CM

## 2021-01-13 DIAGNOSIS — G8929 Other chronic pain: Secondary | ICD-10-CM

## 2021-01-14 NOTE — Therapy (Addendum)
[facility] [address] [place], [place], [address] Phone: [phone]   Fax:  [phone]  Physical Therapy Treatment/Progress Note  Patient Details  Name: [name] MRN: [id number] Date of Birth: 09-17-1970 Referring Provider (PT): Julio Sicks, MD  Progress Note Reporting Period 09/09/20 to 01/13/21  See note below for Objective Data and Assessment of Progress/Goals.      Encounter Date: 01/13/2021   PT End of Session - 01/14/21 1801     Visit Number 20    Number of Visits 31    Date for PT Re-Evaluation 02/12/21    Authorization Type [facility] [employer] [facility]. 15 visits until 03/27/21    Authorization - Visit Number 19    Authorization - Number of Visits 30    Progress Note Due on Visit 20    PT Start Time 0853    PT Stop Time 0935    PT Time Calculation (min) 42 min    Activity Tolerance Patient tolerated treatment well;Patient limited by pain    Behavior During Therapy WFL for tasks assessed/performed             Past Medical History:  Diagnosis Date   Adrenal abnormality (HCC)    Allergy    Anxiety    Arthritis    Atypical chest pain    BPH (benign prostatic hypertrophy)    Complication of anesthesia    Hard time waking up after neck surgery   Depression    GERD (gastroesophageal reflux disease)    "not since Gastric Bypass"   Gout    H/O: CVA (cardiovascular accident)    Hyperlipidemia    Knee pain, bilateral    LBP (low back pain)    Neuromuscular disorder (HCC)    OSA (obstructive sleep apnea)    Plantar fasciitis    PTSD (post-traumatic stress disorder)    Seizure disorder (HCC)    Sleep apnea    Stroke (HCC) 2001   some memory loss- math , science     Past Surgical History:  Procedure Laterality Date   CARPAL TUNNEL RELEASE Bilateral    COLONOSCOPY     ENDOSCOPIC PLANTAR FASCIOTOMY     ESOPHAGOGASTRODUODENOSCOPY     GASTRIC ROUX-EN-Y N/A 12/03/2019   Procedure:  LAPAROSCOPIC ROUX-EN-Y GASTRIC BYPASS WITH UPPER ENDOSCOPY;  Surgeon: Luretha Murphy, MD;  Location: [facility];  Service: General;  Laterality: N/A;   HEEL SPUR SURGERY Left    LUMBAR LAMINECTOMY/DECOMPRESSION MICRODISCECTOMY  2022   Dr. Jordan Likes   NECK SURGERY     Titanium Plate in Vertebrae   SHOULDER SURGERY Right    TRIGGER FINGER RELEASE Right 05/14/2015   Procedure: RIGHT THUMB TRIGGER RELEASE ;  Surgeon: Betha Loa, MD;  Location: [facility];  Service: Orthopedics;  Laterality: Right;   UPPER GASTROINTESTINAL ENDOSCOPY      There were no vitals filed for this visit.   Subjective Assessment - 01/14/21 2047     Subjective Pt reports he is doing well, tolerating the last PT session. My back pain is at it's baselin of 6/10. My sleep has improved by 25%.    Limitations Sitting;Walking;Standing;Lifting    How long can you sit comfortably? 30 mins    How long can you stand comfortably? 30 mins    How long can you walk comfortably? .5 mile    Diagnostic tests 04/03/20 lumbar xray:    FINDINGS:  AP and lateral view intraoperative fluoroscopic images of the  lumbosacral spine are  submitted, 2 images total. The images  demonstrate bilateral pedicle screws at the L5 and S1 levels.  Vertical interconnecting rods were not present at the time the  images were taken. An L5-S1 interbody device is also present.  Overlying retractors.    Patient Stated Goals Pt states wanting to be able to sleep through the night and enjoy grandkids. To tolerate sitting longer and to have greater flexibility.    Pain Score 6     Pain Location Back    Pain Orientation Posterior;Lower    Pain Descriptors / Indicators Aching    Pain Type Chronic pain    Pain Radiating Towards down L leg    Pain Onset More than a month ago    Pain Frequency Constant    Aggravating Factors  prolonged sitting, squating, stepping up, walking, TENs    Pain Relieving Factors None                [facility] PT Assessment -  01/14/21 0001       Strength   Overall Strength Comments Bilat hip flexion strength was limited by pain in supine. In sitting = 4+/5 and hip abd =4+/5 in standing. R knee flexion  and ext = 5/5, L 4+/5.      Transfers   Five time sit to stand comments  13.3               Trigger Point Dry Needling Treatment:  - Skilled palpation to identify triiger points of the L lumbar paraspinals  Pre-treatment instruction: Patient instructed on dry needling rationale, procedures, and possible side effects including pain during treatment (achy,cramping feeling), bruising, drop of blood, lightheadedness, nausea, sweating. Patient Consent Given: Yes Education handout provided: Provided with previous PT session Muscles treated: L lumbar multifidi, iliocostalis L2, L3  Needle size and number: .30x11mm x 1  Electrical stimulation performed: No Parameters: N/A Treatment response/outcome: Twitch response elicited and Palpable decrease in muscle tension Post-treatment instructions: Patient instructed to expect possible mild to moderate muscle soreness later today and/or tomorrow. Patient instructed in methods to reduce muscle soreness and to continue prescribed HEP. If patient was dry needled over the lung field, patient was instructed on signs and symptoms of pneumothorax and, however unlikely, to see immediate medical attention should they occur. Patient was also educated on signs and symptoms of infection and to seek medical attention should they occur. Patient verbalized understanding of these instructions and education.              [facility] Adult PT Treatment/Exercise - 01/14/21 0001       Lumbar Exercises: Standing   Functional Squats 10 reps   2 sets   Functional Squats Limitations 25#    Other Standing Lumbar Exercises Paloff side steps, 72ft, 5x2, 10# with freemotion. Pt reported increased L low back pain/tightness after steping to the L and returning for multiple reps.    Other Standing  Lumbar Exercises Hinged hip forward flexion c dowel for postural reminder, 2x10      Knee/Hip Exercises: Seated   Sit to Sand 10 reps      Cryotherapy   Number Minutes Cryotherapy 15 Minutes    Cryotherapy Location Lumbar Spine    Type of Cryotherapy Ice pack                       PT Short Term Goals - 09/09/20 1325       PT SHORT TERM GOAL #2   Title  Ptwill voice understanding of measures to assist in the management and reduction of pain. 09/09/20, pt uses multiple techniques to help manage his pain: heat, cold, TENS, masssage gun tennis ball massase    Status Achieved    Target Date 09/09/20               PT Long Term Goals - 01/14/21 2022       PT LONG TERM GOAL #2   Title Increase bilat knee and hip strength to at least 4+/5 strength for improved function ability. 09/09/20- R knee and hip have increased to 5/5 and 4+/5 respecively. L knee strength has increased to 4+/5 and the L hip is 4/5, improved but lss than set goal. L knee and hip strengths are impacted by pain. 01/13/21: Bilat hip flexion strength was limited by pain in supine. In sitting hip flex = 4+/5 and In standing hip abd =4+/5 . R knee flexion  and ext = 5/5, L 4+/5.    Status Achieved    Target Date 01/14/21      PT LONG TERM GOAL #3   Title Pt will report 50% improvement in his ability to sleep. 09/09/20- pt reports 25% improvement in his sleep. 01/13/21: 255 improvement in sleep quaility    Status On-going    Target Date 02/12/21      PT LONG TERM GOAL #4   Title Pt will report a decrease in his pain range with ADL to 4-7/10 for improved QOL. 09/09/20- 7-10/10 pain rating      PT LONG TERM GOAL #7   Title Improve 5xSTS time to less than 16 sec as indication of improved function. 01/14/21; 13.3 sec s use of hands    Baseline 21.3 sec    Status Achieved    Target Date 01/14/21                   Plan - 01/14/21 1805     Clinical Impression Statement Continued TPDN to the L2-L3  multifidi and iliocostalis tripper points. TPDN was followed by strength testing, functional testing, and strengthening activities. LE strength and 5xSTS time have both improved and reached goals, while sleep quality is some better, it has not reach the goal of 50% better. With resistive lateral stepping strengthening ex, pt reported and increase in L low back pain, but reported being able to continue with resisted squating and hinged hip bending. Following the session, pt received a cold pack to his low back for 15 mins.    Personal Factors and Comorbidities Time since onset of injury/illness/exacerbation;Comorbidity 1;Comorbidity 2;Comorbidity 3+;Finances    Comorbidities see medical Hx    Examination-Activity Limitations Lift;Squat;Stand;Locomotion Level;Transfers    Examination-Participation Restrictions Other    Stability/Clinical Decision Making Evolving/Moderate complexity    Clinical Decision Making Moderate    Rehab Potential Good    PT Frequency 1x / week    PT Duration 6 weeks    PT Treatment/Interventions ADLs/Self Care Home Management;Aquatic Therapy;Electrical Stimulation;Iontophoresis 4mg /ml Dexamethasone;Moist Heat;Therapeutic exercise;Therapeutic activities;Functional mobility training;Stair training;Gait training;Patient/family education;Manual techniques;Spinal Manipulations;Taping;Dry needling;Balance training;Neuromuscular re-education    PT Next Visit Plan Progress strengthening exs to improve pt's ability to complete functional ability. Assess response to TPDN L L2 and L3 multifidi and iliocostalis. Assess response tp 12/14 session.    PT Home Exercise Plan [id number]    Consulted and Agree with Plan of Care Patient             Patient will benefit from skilled therapeutic intervention in order  to improve the following deficits and impairments:  Decreased range of motion, Difficulty walking, Decreased activity tolerance, Pain, Decreased mobility, Decreased strength  Visit  Diagnosis: Spondylolisthesis, unspecified spinal region  Decreased ROM of lumbar spine  Chronic bilateral low back pain with left-sided sciatica  Difficulty in walking, not elsewhere classified     Problem List Patient Active Problem List   Diagnosis Date Noted   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 12/03/2019   Vitamin B 12 deficiency 12/09/2017   Chronic post-traumatic stress disorder (PTSD) after military combat 06/03/2017   Major depression, recurrent, chronic (HCC) 01/01/2017   Chronic pain syndrome 09/03/2016   Insomnia 07/15/2015   Cervical spondylosis without myelopathy 02/06/2015   Obesity (BMI 30-39.9) 03/15/2013   BPH (benign prostatic hyperplasia) 11/15/2012   Type 2 diabetes mellitus with diabetic polyneuropathy, without long-term current use of insulin (HCC) 12/22/2010   Chronic pain of both knees 12/08/2008   Hyperlipidemia associated with type 2 diabetes mellitus (HCC), on statin 08/23/2006   Gout 08/23/2006   Obstructive sleep apnea 08/23/2006   Allergic rhinitis 08/23/2006   Chronic low back pain 08/23/2006   Seizure disorder (HCC) 08/23/2006   History of CVA (cerebrovascular accident) 08/23/2006    Joellyn Rued MS, PT 01/15/21 9:53 PM    [facility] [address] [place], [place], [address] Phone: [phone]   Fax:  [phone]  Name: [name] MRN: [id number] Date of Birth: 07-17-1970

## 2021-01-15 ENCOUNTER — Ambulatory Visit: Payer: No Typology Code available for payment source

## 2021-01-15 ENCOUNTER — Other Ambulatory Visit: Payer: Self-pay

## 2021-01-15 DIAGNOSIS — M4310 Spondylolisthesis, site unspecified: Secondary | ICD-10-CM | POA: Diagnosis not present

## 2021-01-15 DIAGNOSIS — G8929 Other chronic pain: Secondary | ICD-10-CM

## 2021-01-15 DIAGNOSIS — M5386 Other specified dorsopathies, lumbar region: Secondary | ICD-10-CM

## 2021-01-15 DIAGNOSIS — M5442 Lumbago with sciatica, left side: Secondary | ICD-10-CM

## 2021-01-15 DIAGNOSIS — R262 Difficulty in walking, not elsewhere classified: Secondary | ICD-10-CM

## 2021-01-15 NOTE — Therapy (Signed)
[facility] Outpatient Rehabilitation [facility] [address] [place], [place], [address] Phone: [phone]   Fax:  [phone]  Physical Therapy Treatment  Patient Details  Name: [name] MRN: [id number] Date of Birth: 11/11/1970 Referring Provider (PT): Julio Sicks, MD   Encounter Date: 01/15/2021   PT End of Session - 01/15/21 2119     Visit Number 21    Number of Visits 31    Date for PT Re-Evaluation 02/12/21    Authorization Type [employer]. 15 visits until 03/27/21    Authorization - Visit Number 20    Authorization - Number of Visits 30    PT Start Time 0850    PT Stop Time 0930    PT Time Calculation (min) 40 min    Activity Tolerance Patient tolerated treatment well    Behavior During Therapy WFL for tasks assessed/performed             Past Medical History:  Diagnosis Date   Adrenal abnormality (HCC)    Allergy    Anxiety    Arthritis    Atypical chest pain    BPH (benign prostatic hypertrophy)    Complication of anesthesia    Hard time waking up after neck surgery   Depression    GERD (gastroesophageal reflux disease)    "not since Gastric Bypass"   Gout    H/O: CVA (cardiovascular accident)    Hyperlipidemia    Knee pain, bilateral    LBP (low back pain)    Neuromuscular disorder (HCC)    OSA (obstructive sleep apnea)    Plantar fasciitis    PTSD (post-traumatic stress disorder)    Seizure disorder (HCC)    Sleep apnea    Stroke (HCC) 2001   some memory loss- math , science     Past Surgical History:  Procedure Laterality Date   CARPAL TUNNEL RELEASE Bilateral    COLONOSCOPY     ENDOSCOPIC PLANTAR FASCIOTOMY     ESOPHAGOGASTRODUODENOSCOPY     GASTRIC ROUX-EN-Y N/A 12/03/2019   Procedure: LAPAROSCOPIC ROUX-EN-Y GASTRIC BYPASS WITH UPPER ENDOSCOPY;  Surgeon: Luretha Murphy, MD;  Location: [facility];  Service: General;  Laterality: N/A;   HEEL SPUR SURGERY Left    LUMBAR LAMINECTOMY/DECOMPRESSION  MICRODISCECTOMY  2022   Dr. Jordan Likes   NECK SURGERY     Titanium Plate in Vertebrae   SHOULDER SURGERY Right    TRIGGER FINGER RELEASE Right 05/14/2015   Procedure: RIGHT THUMB TRIGGER RELEASE ;  Surgeon: Betha Loa, MD;  Location: [facility];  Service: Orthopedics;  Laterality: Right;   UPPER GASTROINTESTINAL ENDOSCOPY      There were no vitals filed for this visit.   Subjective Assessment - 01/15/21 0915     Subjective Pt reports his L low back is a little more sore and tight , but his overall pain is a 6/10.    Diagnostic tests 04/03/20 lumbar xray:    FINDINGS:  AP and lateral view intraoperative fluoroscopic images of the  lumbosacral spine are submitted, 2 images total. The images  demonstrate bilateral pedicle screws at the L5 and S1 levels.  Vertical interconnecting rods were not present at the time the  images were taken. An L5-S1 interbody device is also present.  Overlying retractors.    Patient Stated Goals Pt states wanting to be able to sleep through the night and enjoy grandkids. To tolerate sitting longer and to have greater flexibility.    Currently in Pain? Yes  Pain Score 6     Pain Location Back    Pain Orientation Right;Posterior;Lower    Pain Descriptors / Indicators Aching;Tightness    Pain Type Chronic pain    Pain Radiating Towards down L leg- not much 12/16    Pain Onset More than a month ago    Pain Frequency Constant                 Trigger Point Dry Needling Treatment:  - Skilled palpation to identify triiger points of the L lumbar paraspinals  Pre-treatment instruction: Patient instructed on dry needling rationale, procedures, and possible side effects including pain during treatment (achy,cramping feeling), bruising, drop of blood, lightheadedness, nausea, sweating. Patient Consent Given: Yes Education handout provided: Provided with previous PT session Muscles treated: L lumbar multifidi L1, L2, L3  Needle size and number: .30x48mm  x 1  Electrical stimulation performed: No Parameters: N/A Treatment response/outcome: Twitch response elicited and Palpable decrease in muscle tension Post-treatment instructions: Patient instructed to expect possible mild to moderate muscle soreness later today and/or tomorrow. Patient instructed in methods to reduce muscle soreness and to continue prescribed HEP. If patient was dry needled over the lung field, patient was instructed on signs and symptoms of pneumothorax and, however unlikely, to see immediate medical attention should they occur. Patient was also educated on signs and symptoms of infection and to seek medical attention should they occur. Patient verbalized understanding of these instructions and education.                    [facility] Adult PT Treatment/Exercise - 01/15/21 0001       Lumbar Exercises: Aerobic   Nustep 5 mins, L8, arms and legs      Lumbar Exercises: Standing   Functional Squats 10 reps   2 sets, 1 c 15#, 1 c 25#   Functional Squats Limitations lifts from 10"    Other Standing Lumbar Exercises Banded lateral side steps, BTB, 66ftx4    Other Standing Lumbar Exercises Hinged hip forward flexion (dead lift) c dowel for postural reminder, 2x10. pt reported low back discomfort on return      Lumbar Exercises: Seated   Other Seated Lumbar Exercises Hinged hip sit to/from standing c dowel for postural reminder      Shoulder Exercises: ROM/Strengthening   Cybex Row 10 reps   3 sets   Cybex Row Limitations 65#, VC for ab engagement                       PT Short Term Goals - 09/09/20 1325       PT SHORT TERM GOAL #2   Title Ptwill voice understanding of measures to assist in the management and reduction of pain. 09/09/20, pt uses multiple techniques to help manage his pain: heat, cold, TENS, masssage gun tennis ball massase    Status Achieved    Target Date 09/09/20               PT Long Term Goals - 01/14/21 2022       PT  LONG TERM GOAL #2   Title Increase bilat knee and hip strength to at least 4+/5 strength for improved function ability. 09/09/20- R knee and hip have increased to 5/5 and 4+/5 respecively. L knee strength has increased to 4+/5 and the L hip is 4/5, improved but lss than set goal. L knee and hip strengths are impacted by pain. 01/13/21: Bilat hip flexion strength  was limited by pain in supine. In sitting hip flex = 4+/5 and In standing hip abd =4+/5 . R knee flexion  and ext = 5/5, L 4+/5.    Status Achieved    Target Date 01/14/21      PT LONG TERM GOAL #3   Title Pt will report 50% improvement in his ability to sleep. 09/09/20- pt reports 25% improvement in his sleep. 01/13/21: 255 improvement in sleep quaility    Status On-going    Target Date 02/12/21      PT LONG TERM GOAL #4   Title Pt will report a decrease in his pain range with ADL to 4-7/10 for improved QOL. 09/09/20- 7-10/10 pain rating      PT LONG TERM GOAL #7   Title Improve 5xSTS time to less than 16 sec as indication of improved function. 01/14/21; 13.3 sec s use of hands    Baseline 21.3 sec    Status Achieved    Target Date 01/14/21                   Plan - 01/15/21 2121     Clinical Impression Statement Pt tolerated the last PT session with reported min increase in soreness and tightness. Overall, pt reports TPDN has bee helpful with his low back not feeling a tight. PT continues to also work on lumbopelvic/LE/trunk strengthening to improve functional demand and tolerance. FOTO was reassessed and pt's score remains at the same level and is not indicative of his subjective report nor progression in tolerance to ther ex of greater demand.    Personal Factors and Comorbidities Time since onset of injury/illness/exacerbation;Comorbidity 1;Comorbidity 2;Comorbidity 3+;Finances    Comorbidities see medical Hx    Examination-Activity Limitations Lift;Squat;Stand;Locomotion Level;Transfers    Stability/Clinical Decision  Making Evolving/Moderate complexity    Clinical Decision Making Moderate    Rehab Potential Good    PT Frequency 1x / week    PT Duration 6 weeks    PT Treatment/Interventions ADLs/Self Care Home Management;Aquatic Therapy;Electrical Stimulation;Iontophoresis 4mg /ml Dexamethasone;Moist Heat;Therapeutic exercise;Therapeutic activities;Functional mobility training;Stair training;Gait training;Patient/family education;Manual techniques;Spinal Manipulations;Taping;Dry needling;Balance training;Neuromuscular re-education    PT Next Visit Plan Progress strengthening exs to improve pt's ability to complete functional ability. Assess response to TPDN L L2 and L3 multifidi and iliocostalis.    PT Home Exercise Plan [id number]    Consulted and Agree with Plan of Care Patient             Patient will benefit from skilled therapeutic intervention in order to improve the following deficits and impairments:  Decreased range of motion, Difficulty walking, Decreased activity tolerance, Pain, Decreased mobility, Decreased strength  Visit Diagnosis: Spondylolisthesis, unspecified spinal region  Decreased ROM of lumbar spine  Chronic bilateral low back pain with left-sided sciatica  Difficulty in walking, not elsewhere classified     Problem List Patient Active Problem List   Diagnosis Date Noted   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 12/03/2019   Vitamin B 12 deficiency 12/09/2017   Chronic post-traumatic stress disorder (PTSD) after military combat 06/03/2017   Major depression, recurrent, chronic (HCC) 01/01/2017   Chronic pain syndrome 09/03/2016   Insomnia 07/15/2015   Cervical spondylosis without myelopathy 02/06/2015   Obesity (BMI 30-39.9) 03/15/2013   BPH (benign prostatic hyperplasia) 11/15/2012   Type 2 diabetes mellitus with diabetic polyneuropathy, without long-term current use of insulin (HCC) 12/22/2010   Chronic pain of both knees 12/08/2008   Hyperlipidemia associated with  type 2 diabetes  mellitus (HCC), on statin 08/23/2006   Gout 08/23/2006   Obstructive sleep apnea 08/23/2006   Allergic rhinitis 08/23/2006   Chronic low back pain 08/23/2006   Seizure disorder (HCC) 08/23/2006   History of CVA (cerebrovascular accident) 08/23/2006    Joellyn Rued MS, PT 01/15/21 9:50 PM   [facility] Outpatient Rehabilitation [facility] [address] [place], [place], [address] Phone: [phone]   Fax:  [phone]  Name: [name] MRN: [id number] Date of Birth: 1970/11/24

## 2021-01-19 ENCOUNTER — Ambulatory Visit: Payer: No Typology Code available for payment source

## 2021-01-19 ENCOUNTER — Other Ambulatory Visit: Payer: Self-pay

## 2021-01-19 DIAGNOSIS — M5386 Other specified dorsopathies, lumbar region: Secondary | ICD-10-CM

## 2021-01-19 DIAGNOSIS — M4310 Spondylolisthesis, site unspecified: Secondary | ICD-10-CM

## 2021-01-19 DIAGNOSIS — R262 Difficulty in walking, not elsewhere classified: Secondary | ICD-10-CM

## 2021-01-19 DIAGNOSIS — G8929 Other chronic pain: Secondary | ICD-10-CM

## 2021-01-19 NOTE — Therapy (Signed)
[facility] Outpatient Rehabilitation [facility] [address] [place], [place], [address] Phone: [phone]   Fax:  [phone]  Physical Therapy Treatment  Patient Details  Name: [name] MRN: [id number] Date of Birth: 03-10-1970 Referring Provider (PT): Julio Sicks, MD   Encounter Date: 01/19/2021   PT End of Session - 01/19/21 0835     Visit Number 22    Number of Visits 31    Date for PT Re-Evaluation 02/12/21    Authorization Type [employer]. 15 visits until 03/27/21    Authorization Time Period Re-assess FOTO on 23rd visits    Authorization - Visit Number 21    Authorization - Number of Visits 30    PT Start Time 0805    PT Stop Time 0845    PT Time Calculation (min) 40 min    Activity Tolerance Patient tolerated treatment well    Behavior During Therapy WFL for tasks assessed/performed             Past Medical History:  Diagnosis Date   Adrenal abnormality (HCC)    Allergy    Anxiety    Arthritis    Atypical chest pain    BPH (benign prostatic hypertrophy)    Complication of anesthesia    Hard time waking up after neck surgery   Depression    GERD (gastroesophageal reflux disease)    "not since Gastric Bypass"   Gout    H/O: CVA (cardiovascular accident)    Hyperlipidemia    Knee pain, bilateral    LBP (low back pain)    Neuromuscular disorder (HCC)    OSA (obstructive sleep apnea)    Plantar fasciitis    PTSD (post-traumatic stress disorder)    Seizure disorder (HCC)    Sleep apnea    Stroke (HCC) 2001   some memory loss- math , science     Past Surgical History:  Procedure Laterality Date   CARPAL TUNNEL RELEASE Bilateral    COLONOSCOPY     ENDOSCOPIC PLANTAR FASCIOTOMY     ESOPHAGOGASTRODUODENOSCOPY     GASTRIC ROUX-EN-Y N/A 12/03/2019   Procedure: LAPAROSCOPIC ROUX-EN-Y GASTRIC BYPASS WITH UPPER ENDOSCOPY;  Surgeon: Luretha Murphy, MD;  Location: [facility];  Service: General;  Laterality: N/A;    HEEL SPUR SURGERY Left    LUMBAR LAMINECTOMY/DECOMPRESSION MICRODISCECTOMY  2022   Dr. Jordan Likes   NECK SURGERY     Titanium Plate in Vertebrae   SHOULDER SURGERY Right    TRIGGER FINGER RELEASE Right 05/14/2015   Procedure: RIGHT THUMB TRIGGER RELEASE ;  Surgeon: Betha Loa, MD;  Location: [facility];  Service: Orthopedics;  Laterality: Right;   UPPER GASTROINTESTINAL ENDOSCOPY      There were no vitals filed for this visit.   Subjective Assessment - 01/19/21 0811     Subjective Pt reports having more pain this past weekend and using the TENs unit more. Pt note there was no apparent reason for the increase.    Diagnostic tests 04/03/20 lumbar xray:    FINDINGS:  AP and lateral view intraoperative fluoroscopic images of the  lumbosacral spine are submitted, 2 images total. The images  demonstrate bilateral pedicle screws at the L5 and S1 levels.  Vertical interconnecting rods were not present at the time the  images were taken. An L5-S1 interbody device is also present.  Overlying retractors.    Patient Stated Goals Pt states wanting to be able to sleep through the night and enjoy grandkids. To tolerate  sitting longer and to have greater flexibility.    Currently in Pain? Yes    Pain Score 7     Pain Location Back    Pain Orientation Left;Posterior;Lower    Pain Descriptors / Indicators Aching;Tightness    Pain Type Chronic pain    Pain Onset More than a month ago    Pain Frequency Constant    Aggravating Factors  prolonged sitting, squating, stepping up, walking    Pain Relieving Factors cold and heat packs, TENs, stretches    Multiple Pain Sites No                               [facility] Adult PT Treatment/Exercise - 01/19/21 0001       Lumbar Exercises: Stretches   Hip Flexor Stretch Right;Left;3 reps;30 seconds    Hip Flexor Stretch Limitations Thomas stretch    Other Lumbar Stretch Exercise trunk flexion forward and laterally c green TBall, 6x 30  sec      Lumbar Exercises: Aerobic   Nustep 5 mins, L8, arms and legs      Lumbar Exercises: Standing   Row 15 reps   2 sets, 13#, freemotion   Shoulder Extension 15 reps   2 sets, 10#, freemotion   Other Standing Lumbar Exercises Paloff press, L and R, 13#, freemotion    Other Standing Lumbar Exercises forward and backward walking, con and ecc, 10' x 5 each. Farm carry, bilat, 15lbs 136ft      Knee/Hip Exercises: Machines for Strengthening   Hip Cybex L and R, 10x2, 37.5                       PT Short Term Goals - 09/09/20 1325       PT SHORT TERM GOAL #2   Title Ptwill voice understanding of measures to assist in the management and reduction of pain. 09/09/20, pt uses multiple techniques to help manage his pain: heat, cold, TENS, masssage gun tennis ball massase    Status Achieved    Target Date 09/09/20               PT Long Term Goals - 01/14/21 2022       PT LONG TERM GOAL #2   Title Increase bilat knee and hip strength to at least 4+/5 strength for improved function ability. 09/09/20- R knee and hip have increased to 5/5 and 4+/5 respecively. L knee strength has increased to 4+/5 and the L hip is 4/5, improved but lss than set goal. L knee and hip strengths are impacted by pain. 01/13/21: Bilat hip flexion strength was limited by pain in supine. In sitting hip flex = 4+/5 and In standing hip abd =4+/5 . R knee flexion  and ext = 5/5, L 4+/5.    Status Achieved    Target Date 01/14/21      PT LONG TERM GOAL #3   Title Pt will report 50% improvement in his ability to sleep. 09/09/20- pt reports 25% improvement in his sleep. 01/13/21: 255 improvement in sleep quaility    Status On-going    Target Date 02/12/21      PT LONG TERM GOAL #4   Title Pt will report a decrease in his pain range with ADL to 4-7/10 for improved QOL. 09/09/20- 7-10/10 pain rating      PT LONG TERM GOAL #7   Title Improve 5xSTS time to less  than 16 sec as indication of improved  function. 01/14/21; 13.3 sec s use of hands    Baseline 21.3 sec    Status Achieved    Target Date 01/14/21                   Plan - 01/19/21 0957     Clinical Impression Statement PT today focused of flexibility exs for the hip flexors and lumbar paraspinals and for posterior chain strengthening. Continued progression of physical demand of ther ex. Pt tolerated the session without adverse effects or increase in pain above baseline. Pt reported improved flexibility after the session.    Personal Factors and Comorbidities Time since onset of injury/illness/exacerbation;Comorbidity 1;Comorbidity 2;Comorbidity 3+;Finances    Comorbidities see medical Hx    Examination-Activity Limitations Lift;Squat;Stand;Locomotion Level;Transfers    Stability/Clinical Decision Making Evolving/Moderate complexity    Clinical Decision Making Moderate    Rehab Potential Good    PT Frequency 1x / week    PT Duration 6 weeks    PT Treatment/Interventions ADLs/Self Care Home Management;Aquatic Therapy;Electrical Stimulation;Iontophoresis 4mg /ml Dexamethasone;Moist Heat;Therapeutic exercise;Therapeutic activities;Functional mobility training;Stair training;Gait training;Patient/family education;Manual techniques;Spinal Manipulations;Taping;Dry needling;Balance training;Neuromuscular re-education    PT Next Visit Plan Progress strengthening exs to improve pt's ability to complete functional ability. Assess response to TPDN L L2 and L3 multifidi and iliocostalis.    PT Home Exercise Plan [id number]    Consulted and Agree with Plan of Care Patient             Patient will benefit from skilled therapeutic intervention in order to improve the following deficits and impairments:  Decreased range of motion, Difficulty walking, Decreased activity tolerance, Pain, Decreased mobility, Decreased strength  Visit Diagnosis: Spondylolisthesis, unspecified spinal region  Decreased ROM of lumbar spine  Chronic  bilateral low back pain with left-sided sciatica  Difficulty in walking, not elsewhere classified     Problem List Patient Active Problem List   Diagnosis Date Noted   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 12/03/2019   Vitamin B 12 deficiency 12/09/2017   Chronic post-traumatic stress disorder (PTSD) after military combat 06/03/2017   Major depression, recurrent, chronic (HCC) 01/01/2017   Chronic pain syndrome 09/03/2016   Insomnia 07/15/2015   Cervical spondylosis without myelopathy 02/06/2015   Obesity (BMI 30-39.9) 03/15/2013   BPH (benign prostatic hyperplasia) 11/15/2012   Type 2 diabetes mellitus with diabetic polyneuropathy, without long-term current use of insulin (HCC) 12/22/2010   Chronic pain of both knees 12/08/2008   Hyperlipidemia associated with type 2 diabetes mellitus (HCC), on statin 08/23/2006   Gout 08/23/2006   Obstructive sleep apnea 08/23/2006   Allergic rhinitis 08/23/2006   Chronic low back pain 08/23/2006   Seizure disorder (HCC) 08/23/2006   History of CVA (cerebrovascular accident) 08/23/2006    08/25/2006 MS, PT 01/19/21 10:08 AM   [facility] [address] [place], [place], [place] Phone: [phone]   Fax:  [phone]  Name: [name] MRN: [id number] Aly Date of Birth: 1970-07-25

## 2021-01-20 ENCOUNTER — Ambulatory Visit: Payer: No Typology Code available for payment source

## 2021-01-22 ENCOUNTER — Ambulatory Visit: Payer: No Typology Code available for payment source

## 2021-01-27 ENCOUNTER — Ambulatory Visit: Payer: No Typology Code available for payment source

## 2021-01-27 ENCOUNTER — Other Ambulatory Visit: Payer: Self-pay

## 2021-01-27 DIAGNOSIS — M4310 Spondylolisthesis, site unspecified: Secondary | ICD-10-CM | POA: Diagnosis not present

## 2021-01-27 DIAGNOSIS — R262 Difficulty in walking, not elsewhere classified: Secondary | ICD-10-CM

## 2021-01-27 DIAGNOSIS — M5386 Other specified dorsopathies, lumbar region: Secondary | ICD-10-CM

## 2021-01-27 DIAGNOSIS — G8929 Other chronic pain: Secondary | ICD-10-CM

## 2021-01-27 NOTE — Therapy (Signed)
[facility] Outpatient Rehabilitation [facility] [address] [place], [place], [address] Phone: [phone]   Fax:  [phone]  Physical Therapy Treatment  Patient Details  Name: [name] MRN: [id number] Date of Birth: 1971-01-14 Referring Provider (PT): Julio Sicks, MD   Encounter Date: 01/27/2021   PT End of Session - 01/27/21 0810     Visit Number 23    Number of Visits 31    Date for PT Re-Evaluation 02/12/21    Authorization Type [employer]. 15 visits until 03/27/21    Authorization Time Period Re-assessed 12/16, reassess on 30 visit    Authorization - Visit Number 21    Authorization - Number of Visits 30    PT Start Time 0805    PT Stop Time 0845    PT Time Calculation (min) 40 min    Activity Tolerance Patient tolerated treatment well    Behavior During Therapy WFL for tasks assessed/performed             Past Medical History:  Diagnosis Date   Adrenal abnormality (HCC)    Allergy    Anxiety    Arthritis    Atypical chest pain    BPH (benign prostatic hypertrophy)    Complication of anesthesia    Hard time waking up after neck surgery   Depression    GERD (gastroesophageal reflux disease)    "not since Gastric Bypass"   Gout    H/O: CVA (cardiovascular accident)    Hyperlipidemia    Knee pain, bilateral    LBP (low back pain)    Neuromuscular disorder (HCC)    OSA (obstructive sleep apnea)    Plantar fasciitis    PTSD (post-traumatic stress disorder)    Seizure disorder (HCC)    Sleep apnea    Stroke (HCC) 2001   some memory loss- math , science     Past Surgical History:  Procedure Laterality Date   CARPAL TUNNEL RELEASE Bilateral    COLONOSCOPY     ENDOSCOPIC PLANTAR FASCIOTOMY     ESOPHAGOGASTRODUODENOSCOPY     GASTRIC ROUX-EN-Y N/A 12/03/2019   Procedure: LAPAROSCOPIC ROUX-EN-Y GASTRIC BYPASS WITH UPPER ENDOSCOPY;  Surgeon: Luretha Murphy, MD;  Location: [facility];  Service: General;  Laterality:  N/A;   HEEL SPUR SURGERY Left    LUMBAR LAMINECTOMY/DECOMPRESSION MICRODISCECTOMY  2022   Dr. Jordan Likes   NECK SURGERY     Titanium Plate in Vertebrae   SHOULDER SURGERY Right    TRIGGER FINGER RELEASE Right 05/14/2015   Procedure: RIGHT THUMB TRIGGER RELEASE ;  Surgeon: Betha Loa, MD;  Location: [facility];  Service: Orthopedics;  Laterality: Right;   UPPER GASTROINTESTINAL ENDOSCOPY      There were no vitals filed for this visit.   Subjective Assessment - 01/27/21 0808     Subjective PT reports he is doing well with his low back pain at it's baseline level.    Diagnostic tests 04/03/20 lumbar xray:    FINDINGS:  AP and lateral view intraoperative fluoroscopic images of the  lumbosacral spine are submitted, 2 images total. The images  demonstrate bilateral pedicle screws at the L5 and S1 levels.  Vertical interconnecting rods were not present at the time the  images were taken. An L5-S1 interbody device is also present.  Overlying retractors.    Patient Stated Goals Pt states wanting to be able to sleep through the night and enjoy grandkids. To tolerate sitting longer and to have greater flexibility.  Currently in Pain? Yes    Pain Score 6     Pain Location Back    Pain Orientation Left;Posterior;Lower    Pain Descriptors / Indicators Aching    Pain Type Chronic pain    Pain Radiating Towards down L legto hip 12/28    Pain Onset More than a month ago    Pain Frequency Constant                               [facility] Adult PT Treatment/Exercise - 01/27/21 0001       Lumbar Exercises: Stretches   Hip Flexor Stretch Right;Left;3 reps;30 seconds    Hip Flexor Stretch Limitations Thomas stretch    Other Lumbar Stretch Exercise trunk flexion forward and laterally c green TBall, 6x 30 sec      Lumbar Exercises: Aerobic   Nustep 5 mins, L8, arms and legs      Lumbar Exercises: Machines for Strengthening   Leg Press 2x10   100#, 120#     Lumbar  Exercises: Standing   Other Standing Lumbar Exercises Hinged hip squats 3x10, 25#. Hinged hip forward flexion (dead lift) c dowel for postural reminder, 2x10. Partial motion due to low back and hamstrinf tightness      Shoulder Exercises: ROM/Strengthening   Lat Pull 20 reps    Lat Pull Limitations 55#    Cybex Press 20 reps    Cybex Press Limitations 55#    Cybex Row 20 reps    Cybex Row Limitations 65#                       PT Short Term Goals - 09/09/20 1325       PT SHORT TERM GOAL #2   Title Ptwill voice understanding of measures to assist in the management and reduction of pain. 09/09/20, pt uses multiple techniques to help manage his pain: heat, cold, TENS, masssage gun tennis ball massase    Status Achieved    Target Date 09/09/20               PT Long Term Goals - 01/14/21 2022       PT LONG TERM GOAL #2   Title Increase bilat knee and hip strength to at least 4+/5 strength for improved function ability. 09/09/20- R knee and hip have increased to 5/5 and 4+/5 respecively. L knee strength has increased to 4+/5 and the L hip is 4/5, improved but lss than set goal. L knee and hip strengths are impacted by pain. 01/13/21: Bilat hip flexion strength was limited by pain in supine. In sitting hip flex = 4+/5 and In standing hip abd =4+/5 . R knee flexion  and ext = 5/5, L 4+/5.    Status Achieved    Target Date 01/14/21      PT LONG TERM GOAL #3   Title Pt will report 50% improvement in his ability to sleep. 09/09/20- pt reports 25% improvement in his sleep. 01/13/21: 255 improvement in sleep quaility    Status On-going    Target Date 02/12/21      PT LONG TERM GOAL #4   Title Pt will report a decrease in his pain range with ADL to 4-7/10 for improved QOL. 09/09/20- 7-10/10 pain rating      PT LONG TERM GOAL #7   Title Improve 5xSTS time to less than 16 sec as indication of improved function. 01/14/21;  13.3 sec s use of hands    Baseline 21.3 sec    Status  Achieved    Target Date 01/14/21                   Plan - 01/27/21 1127     Clinical Impression Statement PT contnues to address lumbopelvic flexibility and strengthening. Strengthening was also competed for the UB and LB extremities with trunk stabilization. Pt tolerated progression of demand with strengthening therex. Tolerance is especially improved with ther ex where the low back is supported by a seat or chest pad. Tolerance is limited with hinged hip dead lifts. Pt tolerated today's session without adverse effects.    Personal Factors and Comorbidities Time since onset of injury/illness/exacerbation;Comorbidity 1;Comorbidity 2;Comorbidity 3+;Finances    Comorbidities see medical Hx    Examination-Activity Limitations Lift;Squat;Stand;Locomotion Level;Transfers    Examination-Participation Restrictions Other    Clinical Decision Making Moderate    Rehab Potential Good    PT Frequency 1x / week    PT Duration 6 weeks    PT Treatment/Interventions ADLs/Self Care Home Management;Aquatic Therapy;Electrical Stimulation;Iontophoresis 4mg /ml Dexamethasone;Moist Heat;Therapeutic exercise;Therapeutic activities;Functional mobility training;Stair training;Gait training;Patient/family education;Manual techniques;Spinal Manipulations;Taping;Dry needling;Balance training;Neuromuscular re-education    PT Next Visit Plan Progress strengthening exs to improve pt's ability to complete functional ability. Assess response to TPDN L L2 and L3 multifidi and iliocostalis.    PT Home Exercise Plan [id number]    Consulted and Agree with Plan of Care Patient             Patient will benefit from skilled therapeutic intervention in order to improve the following deficits and impairments:  Decreased range of motion, Difficulty walking, Decreased activity tolerance, Pain, Decreased mobility, Decreased strength  Visit Diagnosis: Spondylolisthesis, unspecified spinal region  Decreased ROM of lumbar  spine  Chronic bilateral low back pain with left-sided sciatica  Difficulty in walking, not elsewhere classified     Problem List Patient Active Problem List   Diagnosis Date Noted   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 12/03/2019   Vitamin B 12 deficiency 12/09/2017   Chronic post-traumatic stress disorder (PTSD) after military combat 06/03/2017   Major depression, recurrent, chronic (HCC) 01/01/2017   Chronic pain syndrome 09/03/2016   Insomnia 07/15/2015   Cervical spondylosis without myelopathy 02/06/2015   Obesity (BMI 30-39.9) 03/15/2013   BPH (benign prostatic hyperplasia) 11/15/2012   Type 2 diabetes mellitus with diabetic polyneuropathy, without long-term current use of insulin (HCC) 12/22/2010   Chronic pain of both knees 12/08/2008   Hyperlipidemia associated with type 2 diabetes mellitus (HCC), on statin 08/23/2006   Gout 08/23/2006   Obstructive sleep apnea 08/23/2006   Allergic rhinitis 08/23/2006   Chronic low back pain 08/23/2006   Seizure disorder (HCC) 08/23/2006   History of CVA (cerebrovascular accident) 08/23/2006    08/25/2006 MS, PT 01/27/21 11:50 AM   [facility] Rehabilitation [facility] [address] [place], [place], [place] Phone: [phone]   Fax:  [phone]  Name: [name] MRN: Vicenta Aly Date of Birth: 04-13-1970

## 2021-01-29 ENCOUNTER — Ambulatory Visit: Payer: No Typology Code available for payment source

## 2021-01-29 ENCOUNTER — Other Ambulatory Visit: Payer: Self-pay

## 2021-01-29 DIAGNOSIS — R262 Difficulty in walking, not elsewhere classified: Secondary | ICD-10-CM

## 2021-01-29 DIAGNOSIS — M5442 Lumbago with sciatica, left side: Secondary | ICD-10-CM

## 2021-01-29 DIAGNOSIS — M5386 Other specified dorsopathies, lumbar region: Secondary | ICD-10-CM

## 2021-01-29 DIAGNOSIS — M4310 Spondylolisthesis, site unspecified: Secondary | ICD-10-CM | POA: Diagnosis not present

## 2021-01-29 NOTE — Therapy (Signed)
[facility] Outpatient Rehabilitation [facility] [address] [place], [place], [address] Phone: [phone]   Fax:  [phone]  Physical Therapy Treatment  Patient Details  Name: [name] MRN: [id number] Date of Birth: Jul 12, 1970 Referring Provider (PT): Julio Sicks, MD   Encounter Date: 01/29/2021   PT End of Session - 01/29/21 0830     Visit Number 24    Number of Visits 31    Date for PT Re-Evaluation 02/12/21    Authorization Type [employer]. 15 visits until 03/27/21    Authorization Time Period Re-assessed 12/16, reassess on 30 visit    Authorization - Visit Number 22    Authorization - Number of Visits 30    Progress Note Due on Visit 20    PT Start Time 0807    PT Stop Time 0846    PT Time Calculation (min) 39 min    Activity Tolerance Patient tolerated treatment well    Behavior During Therapy WFL for tasks assessed/performed             Past Medical History:  Diagnosis Date   Adrenal abnormality (HCC)    Allergy    Anxiety    Arthritis    Atypical chest pain    BPH (benign prostatic hypertrophy)    Complication of anesthesia    Hard time waking up after neck surgery   Depression    GERD (gastroesophageal reflux disease)    "not since Gastric Bypass"   Gout    H/O: CVA (cardiovascular accident)    Hyperlipidemia    Knee pain, bilateral    LBP (low back pain)    Neuromuscular disorder (HCC)    OSA (obstructive sleep apnea)    Plantar fasciitis    PTSD (post-traumatic stress disorder)    Seizure disorder (HCC)    Sleep apnea    Stroke (HCC) 2001   some memory loss- math , science     Past Surgical History:  Procedure Laterality Date   CARPAL TUNNEL RELEASE Bilateral    COLONOSCOPY     ENDOSCOPIC PLANTAR FASCIOTOMY     ESOPHAGOGASTRODUODENOSCOPY     GASTRIC ROUX-EN-Y N/A 12/03/2019   Procedure: LAPAROSCOPIC ROUX-EN-Y GASTRIC BYPASS WITH UPPER ENDOSCOPY;  Surgeon: Luretha Murphy, MD;  Location: [facility];   Service: General;  Laterality: N/A;   HEEL SPUR SURGERY Left    LUMBAR LAMINECTOMY/DECOMPRESSION MICRODISCECTOMY  2022   Dr. Jordan Likes   NECK SURGERY     Titanium Plate in Vertebrae   SHOULDER SURGERY Right    TRIGGER FINGER RELEASE Right 05/14/2015   Procedure: RIGHT THUMB TRIGGER RELEASE ;  Surgeon: Betha Loa, MD;  Location: [facility];  Service: Orthopedics;  Laterality: Right;   UPPER GASTROINTESTINAL ENDOSCOPY      There were no vitals filed for this visit.   Subjective Assessment - 01/29/21 0824     Subjective Pt reports he is occasionally having R lateral rib pain whicjh will last for a few days. Last night an episode started.    Diagnostic tests 04/03/20 lumbar xray:    FINDINGS:  AP and lateral view intraoperative fluoroscopic images of the  lumbosacral spine are submitted, 2 images total. The images  demonstrate bilateral pedicle screws at the L5 and S1 levels.  Vertical interconnecting rods were not present at the time the  images were taken. An L5-S1 interbody device is also present.  Overlying retractors.    Patient Stated Goals Pt states wanting to be able to sleep  through the night and enjoy grandkids. To tolerate sitting longer and to have greater flexibility.    Currently in Pain? Yes    Pain Score 6     Pain Location Back    Pain Orientation Right;Posterior;Lower    Pain Descriptors / Indicators Aching    Pain Type Chronic pain    Pain Radiating Towards down L leg to hip 12/30    Pain Onset More than a month ago    Pain Frequency Constant    Aggravating Factors  prolonged sitting, squating, stepping up, walking    Pain Relieving Factors cold and heat packs, TENs, stretches              Trigger Point Dry Needling Treatment:  Pre-treatment instruction: Patient instructed on dry needling rationale, procedures, and possible side effects including pain during treatment (achy,cramping feeling), bruising, drop of blood, lightheadedness, nausea,  sweating. Patient Consent Given: Yes Education handout provided: Previously provided Muscles treated: Following skilled palpation, TPDN was provided to the L1-L3 multifidi bilat  Needle size and number: .30x25mm x 1 and .30x10mm x 1 Electrical stimulation performed: No Parameters: N/A Treatment response/outcome: Twitch response elicited and Palpable decrease in muscle tension Post-treatment instructions: Patient instructed to expect possible mild to moderate muscle soreness later today and/or tomorrow. Patient instructed in methods to reduce muscle soreness and to continue prescribed HEP. If patient was dry needled over the lung field, patient was instructed on signs and symptoms of pneumothorax and, however unlikely, to see immediate medical attention should they occur. Patient was also educated on signs and symptoms of infection and to seek medical attention should they occur. Patient verbalized understanding of these instructions and education.                    [facility] Adult PT Treatment/Exercise - 01/29/21 0001       Lumbar Exercises: Stretches   Hip Flexor Stretch Right;Left;3 reps;30 seconds    Hip Flexor Stretch Limitations Thomas stretch    Other Lumbar Stretch Exercise trunk flexion forward and laterally c green TBall, 6x 30 sec      Lumbar Exercises: Standing   Row 15 reps   2 sets   Row Limitations 20# freemotion    Shoulder Extension 15 reps   2 sets, 10#, freemotion   Shoulder Extension Limitations 20# freemotiuon    Other Standing Lumbar Exercises Partial hinged hip dead lift, from 20", 2x10, 10#    Other Standing Lumbar Exercises Hinged hip sit to/from standing, but taps, 2x10, 25#. Trunk diagonal chops and lifts, each side, 7#, x10 c free motion                       PT Short Term Goals - 09/09/20 1325       PT SHORT TERM GOAL #2   Title Ptwill voice understanding of measures to assist in the management and reduction of pain. 09/09/20, pt  uses multiple techniques to help manage his pain: heat, cold, TENS, masssage gun tennis ball massase    Status Achieved    Target Date 09/09/20               PT Long Term Goals - 01/14/21 2022       PT LONG TERM GOAL #2   Title Increase bilat knee and hip strength to at least 4+/5 strength for improved function ability. 09/09/20- R knee and hip have increased to 5/5 and 4+/5 respecively. L knee strength has increased  to 4+/5 and the L hip is 4/5, improved but lss than set goal. L knee and hip strengths are impacted by pain. 01/13/21: Bilat hip flexion strength was limited by pain in supine. In sitting hip flex = 4+/5 and In standing hip abd =4+/5 . R knee flexion  and ext = 5/5, L 4+/5.    Status Achieved    Target Date 01/14/21      PT LONG TERM GOAL #3   Title Pt will report 50% improvement in his ability to sleep. 09/09/20- pt reports 25% improvement in his sleep. 01/13/21: 255 improvement in sleep quaility    Status On-going    Target Date 02/12/21      PT LONG TERM GOAL #4   Title Pt will report a decrease in his pain range with ADL to 4-7/10 for improved QOL. 09/09/20- 7-10/10 pain rating      PT LONG TERM GOAL #7   Title Improve 5xSTS time to less than 16 sec as indication of improved function. 01/14/21; 13.3 sec s use of hands    Baseline 21.3 sec    Status Achieved    Target Date 01/14/21                   Plan - 01/29/21 1310     Clinical Impression Statement TPDN was provided to the to the L1-L3 multifiidi to assist in decreasing increased muscle tension. TPDN was f/b lumbopelvic flexibility and strengthening. Strengthening was provided to the posterior chain including modified dealift ( decrease FF) which the pt tolerated better, as well as trunk diagonal chops and lifts. PT continues to address strengthening with greater demand and complexity to improve pt's functional mobility with less pain.    Personal Factors and Comorbidities Time since onset of  injury/illness/exacerbation;Comorbidity 1;Comorbidity 2;Comorbidity 3+;Finances    Comorbidities see medical Hx    Examination-Activity Limitations Lift;Squat;Stand;Locomotion Level;Transfers    Examination-Participation Restrictions Other    Stability/Clinical Decision Making Evolving/Moderate complexity    Clinical Decision Making Moderate    Rehab Potential Good    PT Frequency 1x / week    PT Duration 6 weeks    PT Treatment/Interventions ADLs/Self Care Home Management;Aquatic Therapy;Electrical Stimulation;Iontophoresis 4mg /ml Dexamethasone;Moist Heat;Therapeutic exercise;Therapeutic activities;Functional mobility training;Stair training;Gait training;Patient/family education;Manual techniques;Spinal Manipulations;Taping;Dry needling;Balance training;Neuromuscular re-education    PT Next Visit Plan Progress strengthening exs to improve pt's ability to complete functional ability. Assess response to TPDN L L2 and L3 multifidi and iliocostalis.    PT Home Exercise Plan [id number]    Consulted and Agree with Plan of Care Patient             Patient will benefit from skilled therapeutic intervention in order to improve the following deficits and impairments:  Decreased range of motion, Difficulty walking, Decreased activity tolerance, Pain, Decreased mobility, Decreased strength  Visit Diagnosis: Spondylolisthesis, unspecified spinal region  Decreased ROM of lumbar spine  Chronic bilateral low back pain with left-sided sciatica  Difficulty in walking, not elsewhere classified     Problem List Patient Active Problem List   Diagnosis Date Noted   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 12/03/2019   Vitamin B 12 deficiency 12/09/2017   Chronic post-traumatic stress disorder (PTSD) after military combat 06/03/2017   Major depression, recurrent, chronic (HCC) 01/01/2017   Chronic pain syndrome 09/03/2016   Insomnia 07/15/2015   Cervical spondylosis without myelopathy 02/06/2015    Obesity (BMI 30-39.9) 03/15/2013   BPH (benign prostatic hyperplasia) 11/15/2012   Type 2  diabetes mellitus with diabetic polyneuropathy, without long-term current use of insulin (HCC) 12/22/2010   Chronic pain of both knees 12/08/2008   Hyperlipidemia associated with type 2 diabetes mellitus (HCC), on statin 08/23/2006   Gout 08/23/2006   Obstructive sleep apnea 08/23/2006   Allergic rhinitis 08/23/2006   Chronic low back pain 08/23/2006   Seizure disorder (HCC) 08/23/2006   History of CVA (cerebrovascular accident) 08/23/2006    Joellyn Rued MS, PT 01/29/21 1:22 PM   [facility] [address] [place], [place], [address] Phone: [phone]   Fax:  [phone]  Name: [name] MRN: [id number] Date of Birth: [birth year]

## 2021-02-02 ENCOUNTER — Ambulatory Visit: Payer: No Typology Code available for payment source | Attending: Neurosurgery

## 2021-02-02 DIAGNOSIS — M5386 Other specified dorsopathies, lumbar region: Secondary | ICD-10-CM | POA: Insufficient documentation

## 2021-02-02 DIAGNOSIS — M4310 Spondylolisthesis, site unspecified: Secondary | ICD-10-CM | POA: Insufficient documentation

## 2021-02-02 DIAGNOSIS — M5442 Lumbago with sciatica, left side: Secondary | ICD-10-CM | POA: Diagnosis present

## 2021-02-02 DIAGNOSIS — G8929 Other chronic pain: Secondary | ICD-10-CM | POA: Diagnosis present

## 2021-02-02 DIAGNOSIS — R262 Difficulty in walking, not elsewhere classified: Secondary | ICD-10-CM | POA: Insufficient documentation

## 2021-02-02 NOTE — Therapy (Signed)
[facility]patient Rehabilitation [facility] [address] [place], [place], [address] Phone: [phone]   Fax:  [phone]  Physical Therapy Treatment  Patient Details  Name: [name] MRN: [id number] Date of Birth: 21-Jun-1970 Referring Provider (PT): Julio Sicks, MD   Encounter Date: 02/02/2021   PT End of Session - 02/02/21 0858     Visit Number 25    Number of Visits 31    Date for PT Re-Evaluation 02/12/21    Authorization Type [employer]. 15 visits until 03/27/21    Authorization Time Period Re-assessed 12/16, reassess on 30 visit    Authorization - Visit Number 23    Authorization - Number of Visits 30    Progress Note Due on Visit 20    PT Start Time 0820   Pt late for appt   PT Stop Time 0850    PT Time Calculation (min) 30 min    Activity Tolerance Patient tolerated treatment well    Behavior During Therapy WFL for tasks assessed/performed             Past Medical History:  Diagnosis Date   Adrenal abnormality (HCC)    Allergy    Anxiety    Arthritis    Atypical chest pain    BPH (benign prostatic hypertrophy)    Complication of anesthesia    Hard time waking up after neck surgery   Depression    GERD (gastroesophageal reflux disease)    "not since Gastric Bypass"   Gout    H/O: CVA (cardiovascular accident)    Hyperlipidemia    Knee pain, bilateral    LBP (low back pain)    Neuromuscular disorder (HCC)    OSA (obstructive sleep apnea)    Plantar fasciitis    PTSD (post-traumatic stress disorder)    Seizure disorder (HCC)    Sleep apnea    Stroke (HCC) 2001   some memory loss- math , science     Past Surgical History:  Procedure Laterality Date   CARPAL TUNNEL RELEASE Bilateral    COLONOSCOPY     ENDOSCOPIC PLANTAR FASCIOTOMY     ESOPHAGOGASTRODUODENOSCOPY     GASTRIC ROUX-EN-Y N/A 12/03/2019   Procedure: LAPAROSCOPIC ROUX-EN-Y GASTRIC BYPASS WITH UPPER ENDOSCOPY;  Surgeon: Luretha Murphy, MD;   Location: [facility];  Service: General;  Laterality: N/A;   HEEL SPUR SURGERY Left    LUMBAR LAMINECTOMY/DECOMPRESSION MICRODISCECTOMY  2022   Dr. Jordan Likes   NECK SURGERY     Titanium Plate in Vertebrae   SHOULDER SURGERY Right    TRIGGER FINGER RELEASE Right 05/14/2015   Procedure: RIGHT THUMB TRIGGER RELEASE ;  Surgeon: Betha Loa, MD;  Location: [facility];  Service: Orthopedics;  Laterality: Right;   UPPER GASTROINTESTINAL ENDOSCOPY      There were no vitals filed for this visit.   Subjective Assessment - 02/02/21 0825     Subjective Pt reports he tolerated the last session well which involved greater demand.    Diagnostic tests 04/03/20 lumbar xray:    FINDINGS:  AP and lateral view intraoperative fluoroscopic images of the  lumbosacral spine are submitted, 2 images total. The images  demonstrate bilateral pedicle screws at the L5 and S1 levels.  Vertical interconnecting rods were not present at the time the  images were taken. An L5-S1 interbody device is also present.  Overlying retractors.    Patient Stated Goals Pt states wanting to be able to sleep through the night and enjoy  grandkids. To tolerate sitting longer and to have greater flexibility.    Currently in Pain? Yes    Pain Score 7     Pain Location Back    Pain Orientation Right    Pain Descriptors / Indicators Aching    Pain Type Chronic pain    Pain Radiating Towards down L leg to hip 12/30    Pain Onset More than a month ago    Pain Frequency Constant                               [facility] Adult PT Treatment/Exercise - 02/02/21 0001       Lumbar Exercises: Stretches   Hip Flexor Stretch Right;Left;3 reps;30 seconds    Hip Flexor Stretch Limitations Thomas stretch    Other Lumbar Stretch Exercise trunk flexion forward and laterally c green TBall, 6x 30 sec      Lumbar Exercises: Standing   Other Standing Lumbar Exercises Farmers carry, 15# bilat, 185 ft. Partial forward lunges 15#  bilat, x10 ea    Other Standing Lumbar Exercises Hinged hip squats, 25#, x15      Shoulder Exercises: ROM/Strengthening   Lat Pull 20 reps   2 sets   Lat Pull Limitations 55#    Cybex Press 20 reps   2 sets   Cybex Press Limitations 65#    Cybex Row 20 reps   2 sets   Cybex Row Limitations 65#                       PT Short Term Goals - 09/09/20 1325       PT SHORT TERM GOAL #2   Title Ptwill voice understanding of measures to assist in the management and reduction of pain. 09/09/20, pt uses multiple techniques to help manage his pain: heat, cold, TENS, masssage gun tennis ball massase    Status Achieved    Target Date 09/09/20               PT Long Term Goals - 01/14/21 2022       PT LONG TERM GOAL #2   Title Increase bilat knee and hip strength to at least 4+/5 strength for improved function ability. 09/09/20- R knee and hip have increased to 5/5 and 4+/5 respecively. L knee strength has increased to 4+/5 and the L hip is 4/5, improved but lss than set goal. L knee and hip strengths are impacted by pain. 01/13/21: Bilat hip flexion strength was limited by pain in supine. In sitting hip flex = 4+/5 and In standing hip abd =4+/5 . R knee flexion  and ext = 5/5, L 4+/5.    Status Achieved    Target Date 01/14/21      PT LONG TERM GOAL #3   Title Pt will report 50% improvement in his ability to sleep. 09/09/20- pt reports 25% improvement in his sleep. 01/13/21: 255 improvement in sleep quaility    Status On-going    Target Date 02/12/21      PT LONG TERM GOAL #4   Title Pt will report a decrease in his pain range with ADL to 4-7/10 for improved QOL. 09/09/20- 7-10/10 pain rating      PT LONG TERM GOAL #7   Title Improve 5xSTS time to less than 16 sec as indication of improved function. 01/14/21; 13.3 sec s use of hands    Baseline 21.3 sec  Status Achieved    Target Date 01/14/21                   Plan - 02/02/21 [id number]     Clinical Impression  Statement Pt arrived late for appt. Pt was able to completee flexibility and strengthenig  for lumbopelvic, UB and LB. Pt to continues to tolerated gradual progression with strengthening demand. Pt notes no increase in pain above baseline.    Personal Factors and Comorbidities Time since onset of injury/illness/exacerbation;Comorbidity 1;Comorbidity 2;Comorbidity 3+;Finances    Comorbidities see medical Hx    Examination-Activity Limitations Lift;Squat;Stand;Locomotion Level;Transfers    Examination-Participation Restrictions Other    Stability/Clinical Decision Making Evolving/Moderate complexity    Clinical Decision Making Moderate    Rehab Potential Good    PT Frequency 1x / week    PT Duration 6 weeks    PT Treatment/Interventions ADLs/Self Care Home Management;Aquatic Therapy;Electrical Stimulation;Iontophoresis 4mg /ml Dexamethasone;Moist Heat;Therapeutic exercise;Therapeutic activities;Functional mobility training;Stair training;Gait training;Patient/family education;Manual techniques;Spinal Manipulations;Taping;Dry needling;Balance training;Neuromuscular re-education    PT Next Visit Plan Progress strengthening exs to improve pt's ability to complete functional ability. Assess response to TPDN L L2 and L3 multifidi and iliocostalis.    PT Home Exercise Plan [id number]    Consulted and Agree with Plan of Care Patient             Patient will benefit from skilled therapeutic intervention in order to improve the following deficits and impairments:  Decreased range of motion, Difficulty walking, Decreased activity tolerance, Pain, Decreased mobility, Decreased strength  Visit Diagnosis: Spondylolisthesis, unspecified spinal region  Decreased ROM of lumbar spine  Chronic bilateral low back pain with left-sided sciatica  Difficulty in walking, not elsewhere classified     Problem List Patient Active Problem List   Diagnosis Date Noted   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  12/03/2019   Vitamin B 12 deficiency 12/09/2017   Chronic post-traumatic stress disorder (PTSD) after military combat 06/03/2017   Major depression, recurrent, chronic (HCC) 01/01/2017   Chronic pain syndrome 09/03/2016   Insomnia 07/15/2015   Cervical spondylosis without myelopathy 02/06/2015   Obesity (BMI 30-39.9) 03/15/2013   BPH (benign prostatic hyperplasia) 11/15/2012   Type 2 diabetes mellitus with diabetic polyneuropathy, without long-term current use of insulin (HCC) 12/22/2010   Chronic pain of both knees 12/08/2008   Hyperlipidemia associated with type 2 diabetes mellitus (HCC), on statin 08/23/2006   Gout 08/23/2006   Obstructive sleep apnea 08/23/2006   Allergic rhinitis 08/23/2006   Chronic low back pain 08/23/2006   Seizure disorder (HCC) 08/23/2006   History of CVA (cerebrovascular accident) 08/23/2006   Joellyn RuedAllen Dylann Gallier MS, PT 02/02/21 9:32 AM   [facility] [address] [place], [place], [address] Phone: [phone]   Fax:  [phone]  Name: [name] MRN: [id number] Date of Birth: 05/19/1970

## 2021-02-05 ENCOUNTER — Other Ambulatory Visit: Payer: Self-pay

## 2021-02-05 ENCOUNTER — Ambulatory Visit: Payer: No Typology Code available for payment source

## 2021-02-05 DIAGNOSIS — G8929 Other chronic pain: Secondary | ICD-10-CM

## 2021-02-05 DIAGNOSIS — M5386 Other specified dorsopathies, lumbar region: Secondary | ICD-10-CM

## 2021-02-05 DIAGNOSIS — R262 Difficulty in walking, not elsewhere classified: Secondary | ICD-10-CM

## 2021-02-05 DIAGNOSIS — M4310 Spondylolisthesis, site unspecified: Secondary | ICD-10-CM

## 2021-02-05 DIAGNOSIS — M5442 Lumbago with sciatica, left side: Secondary | ICD-10-CM

## 2021-02-05 NOTE — Therapy (Signed)
[facility] Rehabilitation [facility] [address] [place], [place], [address] Phone: [phone]   Fax:  [phone]  Physical Therapy Treatment  Patient Details  Name: [name] MRN: [id number] Date of Birth: Dec 17, 1970 Referring Provider (PT): Julio Sicks, MD   Encounter Date: 02/05/2021   PT End of Session - 02/05/21 0812     Visit Number 26    Number of Visits 31    Date for PT Re-Evaluation 02/12/21    Authorization Type [facility] [employer]. 15 visits until 03/27/21    Authorization - Visit Number 25    Authorization - Number of Visits 30    Progress Note Due on Visit 20    PT Start Time 0804    PT Stop Time 0845    PT Time Calculation (min) 41 min    Activity Tolerance Patient tolerated treatment well    Behavior During Therapy WFL for tasks assessed/performed             Past Medical History:  Diagnosis Date   Adrenal abnormality (HCC)    Allergy    Anxiety    Arthritis    Atypical chest pain    BPH (benign prostatic hypertrophy)    Complication of anesthesia    Hard time waking up after neck surgery   Depression    GERD (gastroesophageal reflux disease)    "not since Gastric Bypass"   Gout    H/O: CVA (cardiovascular accident)    Hyperlipidemia    Knee pain, bilateral    LBP (low back pain)    Neuromuscular disorder (HCC)    OSA (obstructive sleep apnea)    Plantar fasciitis    PTSD (post-traumatic stress disorder)    Seizure disorder (HCC)    Sleep apnea    Stroke (HCC) 2001   some memory loss- math , science     Past Surgical History:  Procedure Laterality Date   CARPAL TUNNEL RELEASE Bilateral    COLONOSCOPY     ENDOSCOPIC PLANTAR FASCIOTOMY     ESOPHAGOGASTRODUODENOSCOPY     GASTRIC ROUX-EN-Y N/A 12/03/2019   Procedure: LAPAROSCOPIC ROUX-EN-Y GASTRIC BYPASS WITH UPPER ENDOSCOPY;  Surgeon: Luretha Murphy, MD;  Location: [facility];  Service: General;  Laterality: N/A;   HEEL SPUR SURGERY Left     LUMBAR LAMINECTOMY/DECOMPRESSION MICRODISCECTOMY  2022   Dr. Jordan Likes   NECK SURGERY     Titanium Plate in Vertebrae   SHOULDER SURGERY Right    TRIGGER FINGER RELEASE Right 05/14/2015   Procedure: RIGHT THUMB TRIGGER RELEASE ;  Surgeon: Betha Loa, MD;  Location: [facility];  Service: Orthopedics;  Laterality: Right;   UPPER GASTROINTESTINAL ENDOSCOPY      There were no vitals filed for this visit.   Subjective Assessment - 02/05/21 0808     Subjective Pt reports he is OK, at his baseline of pain. Pt notes he walked about 2 miles yesterday    How long can you sit comfortably? 30 mins    How long can you stand comfortably? 30 mins    How long can you walk comfortably? .5 mile    Diagnostic tests 04/03/20 lumbar xray:    FINDINGS:  AP and lateral view intraoperative fluoroscopic images of the  lumbosacral spine are submitted, 2 images total. The images  demonstrate bilateral pedicle screws at the L5 and S1 levels.  Vertical interconnecting rods were not present at the time the  images were taken. An L5-S1 interbody device is also present.  Overlying retractors.    Patient Stated Goals Pt states wanting to be able to sleep through the night and enjoy grandkids. To tolerate sitting longer and to have greater flexibility.    Currently in Pain? Yes    Pain Score 6     Pain Location Back    Pain Orientation Left    Pain Descriptors / Indicators Aching    Pain Type Chronic pain    Pain Radiating Towards to L hip    Pain Onset More than a month ago    Pain Frequency Constant    Aggravating Factors  prolonged sitting, squating, stepping up, walking    Pain Relieving Factors cold and heat packs, TENs, stretches                               [facility] Adult PT Treatment/Exercise - 02/05/21 0001       Lumbar Exercises: Standing   Other Standing Lumbar Exercises Arm pull downs c spring board, yellow, levl &, 2x15      Lumbar Exercises: Prone   Straight Leg Raise  15 reps   L and R   Opposite Arm/Leg Raise Right arm/Left leg;Left arm/Right leg;20 reps      Knee/Hip Exercises: Machines for Strengthening   Cybex Leg Press 2x15, 120#    Hip Cybex 2x10, 25#, L and R      Shoulder Exercises: ROM/Strengthening   Lat Pull 15 reps   2 sets   Lat Pull Limitations 55#    Cybex Press 15 reps   2 sets   Cybex Press Limitations 65#    Cybex Row 15 reps   2 sets   Cybex Row Limitations 65#                       PT Short Term Goals - 09/09/20 1325       PT SHORT TERM GOAL #2   Title Ptwill voice understanding of measures to assist in the management and reduction of pain. 09/09/20, pt uses multiple techniques to help manage his pain: heat, cold, TENS, masssage gun tennis ball massase    Status Achieved    Target Date 09/09/20               PT Long Term Goals - 01/14/21 2022       PT LONG TERM GOAL #2   Title Increase bilat knee and hip strength to at least 4+/5 strength for improved function ability. 09/09/20- R knee and hip have increased to 5/5 and 4+/5 respecively. L knee strength has increased to 4+/5 and the L hip is 4/5, improved but lss than set goal. L knee and hip strengths are impacted by pain. 01/13/21: Bilat hip flexion strength was limited by pain in supine. In sitting hip flex = 4+/5 and In standing hip abd =4+/5 . R knee flexion  and ext = 5/5, L 4+/5.    Status Achieved    Target Date 01/14/21      PT LONG TERM GOAL #3   Title Pt will report 50% improvement in his ability to sleep. 09/09/20- pt reports 25% improvement in his sleep. 01/13/21: 255 improvement in sleep quaility    Status On-going    Target Date 02/12/21      PT LONG TERM GOAL #4   Title Pt will report a decrease in his pain range with ADL to 4-7/10 for improved QOL. 09/09/20- 7-10/10  pain rating      PT LONG TERM GOAL #7   Title Improve 5xSTS time to less than 16 sec as indication of improved function. 01/14/21; 13.3 sec s use of hands    Baseline 21.3  sec    Status Achieved    Target Date 01/14/21                   Plan - 02/05/21 0841     Clinical Impression Statement PT was continued for trunk, UB, LB strengthening focused on core, LB, hip strength. Pt tolerated the quad and prone exs for back strengthening. Pt will continue to progress demand and complexity of movement patterns to improve strength and function.    Personal Factors and Comorbidities Time since onset of injury/illness/exacerbation;Comorbidity 1;Comorbidity 2;Comorbidity 3+;Finances    Comorbidities see medical Hx    Examination-Activity Limitations Lift;Squat;Stand;Locomotion Level;Transfers    Stability/Clinical Decision Making Evolving/Moderate complexity    Clinical Decision Making Moderate    Rehab Potential Good    PT Frequency 1x / week    PT Duration 6 weeks    PT Treatment/Interventions ADLs/Self Care Home Management;Aquatic Therapy;Electrical Stimulation;Iontophoresis 4mg /ml Dexamethasone;Moist Heat;Therapeutic exercise;Therapeutic activities;Functional mobility training;Stair training;Gait training;Patient/family education;Manual techniques;Spinal Manipulations;Taping;Dry needling;Balance training;Neuromuscular re-education    PT Next Visit Plan Progress strengthening exs to improve pt's ability to complete functional ability. Assess response to TPDN L L2 and L3 multifidi and iliocostalis. Continue to progress strengthening    PT Home Exercise Plan [email or web address]    Consulted and Agree with Plan of Care Patient             Patient will benefit from skilled therapeutic intervention in order to improve the following deficits and impairments:  Decreased range of motion, Difficulty walking, Decreased activity tolerance, Pain, Decreased mobility, Decreased strength  Visit Diagnosis: Spondylolisthesis, unspecified spinal region  Decreased ROM of lumbar spine  Chronic bilateral low back pain with left-sided sciatica  Difficulty in walking, not elsewhere  classified     Problem List Patient Active Problem List   Diagnosis Date Noted   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 12/03/2019   Vitamin B 12 deficiency 12/09/2017   Chronic post-traumatic stress disorder (PTSD) after military combat 06/03/2017   Major depression, recurrent, chronic (HCC) 01/01/2017   Chronic pain syndrome 09/03/2016   Insomnia 07/15/2015   Cervical spondylosis without myelopathy 02/06/2015   Obesity (BMI 30-39.9) 03/15/2013   BPH (benign prostatic hyperplasia) 11/15/2012   Type 2 diabetes mellitus with diabetic polyneuropathy, without long-term current use of insulin (HCC) 12/22/2010   Chronic pain of both knees 12/08/2008   Hyperlipidemia associated with type 2 diabetes mellitus (HCC), on statin 08/23/2006   Gout 08/23/2006   Obstructive sleep apnea 08/23/2006   Allergic rhinitis 08/23/2006   Chronic low back pain 08/23/2006   Seizure disorder (HCC) 08/23/2006   History of CVA (cerebrovascular accident) 08/23/2006    08/25/2006 MS, PT 02/05/21 8:48 AM   [facility] [address] [place], [place], [place] Phone: [phone]   Fax:  [phone]  Name: [name] MRN: Vicenta Aly Date of Birth: [birth year]

## 2021-02-17 ENCOUNTER — Ambulatory Visit: Payer: No Typology Code available for payment source

## 2021-02-17 ENCOUNTER — Other Ambulatory Visit: Payer: Self-pay

## 2021-02-17 DIAGNOSIS — M5386 Other specified dorsopathies, lumbar region: Secondary | ICD-10-CM

## 2021-02-17 DIAGNOSIS — M4310 Spondylolisthesis, site unspecified: Secondary | ICD-10-CM

## 2021-02-17 DIAGNOSIS — R262 Difficulty in walking, not elsewhere classified: Secondary | ICD-10-CM

## 2021-02-17 DIAGNOSIS — G8929 Other chronic pain: Secondary | ICD-10-CM

## 2021-02-17 NOTE — Therapy (Signed)
[place], [place], [address] Phone: [phone]   Fax:  [phone]  Physical Therapy Treatment/ERO  Patient Details  Name: [name] MRN: [id number] Date of Birth: Jun 20, 1970 Referring Provider (PT): Earnie Larsson, MD   Encounter Date: 02/17/2021   PT End of Session - 02/17/21 0952     Visit Number 27    Number of Visits 31    Date for PT Re-Evaluation 02/12/21    Authorization Type [facility] [place]. 15 visits until 03/27/21    Authorization Time Period Re-assessed 12/16, reassess on 30 visit    Authorization - Visit Number 26    Authorization - Number of Visits 30    PT Start Time 0810    PT Stop Time 0845    PT Time Calculation (min) 35 min    Activity Tolerance Patient tolerated treatment well    Behavior During Therapy WFL for tasks assessed/performed             Past Medical History:  Diagnosis Date   Adrenal abnormality (HCC)    Allergy    Anxiety    Arthritis    Atypical chest pain    BPH (benign prostatic hypertrophy)    Complication of anesthesia    Hard time waking up after neck surgery   Depression    GERD (gastroesophageal reflux disease)    "not since Gastric Bypass"   Gout    H/O: CVA (cardiovascular accident)    Hyperlipidemia    Knee pain, bilateral    LBP (low back pain)    Neuromuscular disorder (HCC)    OSA (obstructive sleep apnea)    Plantar fasciitis    PTSD (post-traumatic stress disorder)    Seizure disorder ([place])    Sleep apnea    Stroke ([place]) 2001   some memory loss- math , science     Past Surgical History:  Procedure Laterality Date   CARPAL TUNNEL RELEASE Bilateral    COLONOSCOPY     ENDOSCOPIC PLANTAR FASCIOTOMY     ESOPHAGOGASTRODUODENOSCOPY     GASTRIC ROUX-EN-Y N/A 12/03/2019   Procedure: LAPAROSCOPIC ROUX-EN-Y GASTRIC BYPASS WITH UPPER ENDOSCOPY;  Surgeon: Johnathan Hausen, MD;  Location: [facility];  Service: General;   Laterality: N/A;   HEEL SPUR SURGERY Left    LUMBAR LAMINECTOMY/DECOMPRESSION MICRODISCECTOMY  2022   Dr. Annette Stable   NECK SURGERY     Titanium Plate in Vertebrae   SHOULDER SURGERY Right    TRIGGER FINGER RELEASE Right 05/14/2015   Procedure: RIGHT THUMB TRIGGER RELEASE ;  Surgeon: Leanora Cover, MD;  Location: [facility];  Service: Orthopedics;  Laterality: Right;   UPPER GASTROINTESTINAL ENDOSCOPY      There were no vitals filed for this visit.   Subjective Assessment - 02/17/21 0813     Subjective More pain for the past several, L>R for the low back. No obvious reason as to the increase.    Diagnostic tests 04/03/20 lumbar xray:    FINDINGS:  AP and lateral view intraoperative fluoroscopic images of the  lumbosacral spine are submitted, 2 images total. The images  demonstrate bilateral pedicle screws at the L5 and S1 levels.  Vertical interconnecting rods were not present at the time the  images were taken. An L5-S1 interbody device is also present.  Overlying retractors.    Patient Stated Goals Pt states wanting to be able to sleep through the night and enjoy grandkids. To tolerate sitting longer and to have  greater flexibility.    Currently in Pain? Yes    Pain Score 8     Pain Location Back    Pain Orientation Left    Pain Descriptors / Indicators Aching    Pain Type Chronic pain    Pain Onset More than a month ago    Pain Frequency Constant    Aggravating Factors  prolonged sitting, squating, stepping up, walking    Pain Relieving Factors cold and heat packs, TENs, stretches    Multiple Pain Sites No                               [facility] Adult PT Treatment/Exercise - 02/17/21 0001       Lumbar Exercises: Stretches   Passive Hamstring Stretch Right;Left;2 reps;20 seconds    Lower Trunk Rotation 5 reps;10 seconds    Hip Flexor Stretch Right;Left;3 reps;30 seconds    Hip Flexor Stretch Limitations Thomas stretch    Piriformis Stretch Right;Left;2  reps;20 seconds    Figure 4 Stretch 2 reps;20 seconds    Other Lumbar Stretch Exercise trunk flexion forward and laterally c green TBall, 6x 30 sec      Lumbar Exercises: Aerobic   Nustep 5 mins, L7, arms and legs                Trigger Point Dry Needling Treatment: Pre-treatment instruction: Patient instructed on dry needling rationale, procedures, and possible side effects including pain during treatment (achy,cramping feeling), bruising, drop of blood, lightheadedness, nausea, sweating. Patient Consent Given: Yes Education handout provided: Previously provided Muscles treated: multifidi, longissimus L1-L3 Needle size and number: .30x12mm x 2 Electrical stimulation performed: No Parameters: N/A Treatment response/outcome: Twitch response elicited and Palpable decrease in muscle tension Post-treatment instructions: Patient instructed to expect possible mild to moderate muscle soreness later today and/or tomorrow. Patient instructed in methods to reduce muscle soreness and to continue prescribed HEP. If patient was dry needled over the lung field, patient was instructed on signs and symptoms of pneumothorax and, however unlikely, to see immediate medical attention should they occur. Patient was also educated on signs and symptoms of infection and to seek medical attention should they occur. Patient verbalized understanding of these instructions and education.             PT Short Term Goals - 09/09/20 1325       PT SHORT TERM GOAL #2   Title Ptwill voice understanding of measures to assist in the management and reduction of pain. 09/09/20, pt uses multiple techniques to help manage his pain: heat, cold, TENS, masssage gun tennis ball massase    Status Achieved    Target Date 09/09/20               PT Long Term Goals - 02/17/21 1015       PT LONG TERM GOAL #1   Title Increase trunk AROMs by at least 25% to improve pt's ability to complete functional activities.  09/09/20-Pt trunks ROMs have increased min to mod except L side bending which is the same    Baseline See flowsheets    Status On-going    Target Date 03/29/21      PT LONG TERM GOAL #2   Title Increase bilat knee and hip strength to at least 4+/5 strength for improved function ability. 09/09/20- R knee and hip have increased to 5/5 and 4+/5 respecively. L knee strength has increased to 4+/5 and  the L hip is 4/5, improved but lss than set goal. L knee and hip strengths are impacted by pain. 01/13/21: Bilat hip flexion strength was limited by pain in supine. In sitting hip flex = 4+/5 and In standing hip abd =4+/5 . R knee flexion  and ext = 5/5, L 4+/5.    Status Achieved    Target Date 01/14/21      PT LONG TERM GOAL #3   Title Pt will report 50% improvement in his ability to sleep. 09/09/20- pt reports 25% improvement in his sleep. 01/13/21: 255 improvement in sleep quaility    Status On-going    Target Date 03/29/21      PT LONG TERM GOAL #4   Title Pt will report a decrease in his pain range with ADL to 4-7/10 for improved QOL. 09/09/20- 7-10/10 pain rating    Baseline 7-10/10    Status On-going    Target Date 03/29/21      PT LONG TERM GOAL #5   Title Pt's FOTO score will increase to he predicted value of 51% functional ability. 08/07/20- 45%. 10/16/20: 39%    Baseline 37%    Status On-going    Target Date 03/29/21      PT LONG TERM GOAL #6   Title Pt will be Ind in a final HEP to maintain or progress achieved LOF    Status On-going    Target Date 03/29/21      PT LONG TERM GOAL #7   Title Improve 5xSTS time to less than 16 sec as indication of improved function. 01/14/21; 13.3 sec s use of hands    Baseline 21.3 sec    Status Achieved    Target Date 01/14/21                   Plan - 02/17/21 0954     Clinical Impression Statement Pt presents with increase low back pain L>R. palpation reveal increased muscle tension of the paraspinals such that with completing TPDN,  inserting the needle encountered more resistance than with previous TPDN sessions. The pt then completed lumbopelvic flexibility exs. Pt will continue to benefit from skilled PT to address stength and flexibility deficits optimize functional mobility with less or mnaged apin.    Personal Factors and Comorbidities Time since onset of injury/illness/exacerbation;Comorbidity 1;Comorbidity 2;Comorbidity 3+;Finances    Comorbidities see medical Hx    Examination-Activity Limitations Lift;Squat;Stand;Locomotion Level;Transfers    Stability/Clinical Decision Making Evolving/Moderate complexity    Clinical Decision Making Moderate    Rehab Potential Good    PT Frequency 1x / week    PT Duration 4 weeks    PT Treatment/Interventions ADLs/Self Care Home Management;Aquatic Therapy;Electrical Stimulation;Iontophoresis 4mg /ml Dexamethasone;Moist Heat;Therapeutic exercise;Therapeutic activities;Functional mobility training;Stair training;Gait training;Patient/family education;Manual techniques;Spinal Manipulations;Taping;Dry needling;Balance training;Neuromuscular re-education    PT Next Visit Plan Progress strengthening exs to improve pt's ability to complete functional ability. Assess response to TPDN L1, L2 and L3 multifidi and iliocostalis. Continue to progress strengthening    PT Home Exercise Plan [email or web address]    Consulted and Agree with Plan of Care Patient             Patient will benefit from skilled therapeutic intervention in order to improve the following deficits and impairments:  Decreased range of motion, Difficulty walking, Decreased activity tolerance, Pain, Decreased mobility, Decreased strength  Visit Diagnosis: Spondylolisthesis, unspecified spinal region  Decreased ROM of lumbar spine  Chronic bilateral low back pain with left-sided sciatica  Difficulty in walking, not  elsewhere classified     Problem List Patient Active Problem List   Diagnosis Date Noted   Lap roux en Y  gastric bypass Nov 2021 12/03/2019   Vitamin B 12 deficiency 12/09/2017   Chronic post-traumatic stress disorder (PTSD) after military combat 06/03/2017   Major depression, recurrent, chronic ([place]) 01/01/2017   Chronic pain syndrome 09/03/2016   Insomnia 07/15/2015   Cervical spondylosis without myelopathy 02/06/2015   Obesity (BMI 30-39.9) 03/15/2013   BPH (benign prostatic hyperplasia) 11/15/2012   Type 2 diabetes mellitus with diabetic polyneuropathy, without long-term current use of insulin ([place]) 12/22/2010   Chronic pain of both knees 12/08/2008   Hyperlipidemia associated with type 2 diabetes mellitus ([place]), on statin 08/23/2006   Gout 08/23/2006   Obstructive sleep apnea 08/23/2006   Allergic rhinitis 08/23/2006   Chronic low back pain 08/23/2006   Seizure disorder ([place]) 08/23/2006   History of CVA (cerebrovascular accident) 08/23/2006    Gar Ponto MS, PT 02/17/21 1:10 PM   [place] [facility] [address] [place], [place], [address] Phone: [phone]   Fax:  [phone]  Name: SIMON OHLINGER MRN: [id number] Date of Birth: Apr 21, 1970

## 2021-02-26 ENCOUNTER — Ambulatory Visit: Payer: No Typology Code available for payment source

## 2021-02-26 ENCOUNTER — Other Ambulatory Visit: Payer: Self-pay

## 2021-02-26 DIAGNOSIS — M4310 Spondylolisthesis, site unspecified: Secondary | ICD-10-CM | POA: Diagnosis not present

## 2021-02-26 DIAGNOSIS — G8929 Other chronic pain: Secondary | ICD-10-CM

## 2021-02-26 DIAGNOSIS — R262 Difficulty in walking, not elsewhere classified: Secondary | ICD-10-CM

## 2021-02-26 DIAGNOSIS — M5386 Other specified dorsopathies, lumbar region: Secondary | ICD-10-CM

## 2021-02-26 NOTE — Therapy (Signed)
[facility] [address] [place], [place], [address] Phone: [phone]   Fax:  [phone]  Physical Therapy Treatment  Patient Details  Name: [name] MRN: [id number] Date of Birth: February 05, 1970 Referring Provider (PT): Julio SicksPool, Henry, MD   Encounter Date: 02/26/2021   PT End of Session - 02/26/21 0857     Visit Number 28    Number of Visits 31    Date for PT Re-Evaluation 02/12/21    Authorization Type [facility]. 15 visits until 03/27/21    Authorization Time Period Re-assessed 12/16, reassess on 30 visit    Authorization - Visit Number 27    Authorization - Number of Visits 30    PT Start Time [phone]    PT Stop Time 0945    PT Time Calculation (min) 53 min    Activity Tolerance Patient tolerated treatment well    Behavior During Therapy WFL for tasks assessed/performed             Past Medical History:  Diagnosis Date   Adrenal abnormality (HCC)    Allergy    Anxiety    Arthritis    Atypical chest pain    BPH (benign prostatic hypertrophy)    Complication of anesthesia    Hard time waking up after neck surgery   Depression    GERD (gastroesophageal reflux disease)    "not since Gastric Bypass"   Gout    H/O: CVA (cardiovascular accident)    Hyperlipidemia    Knee pain, bilateral    LBP (low back pain)    Neuromuscular disorder ([facility])    OSA (obstructive sleep apnea)    Plantar fasciitis    PTSD (post-traumatic stress disorder)    Seizure disorder ([facility])    Sleep apnea    Stroke ([facility]) 2001   some memory loss- math , science     Past Surgical History:  Procedure Laterality Date   CARPAL TUNNEL RELEASE Bilateral    COLONOSCOPY     ENDOSCOPIC PLANTAR FASCIOTOMY     ESOPHAGOGASTRODUODENOSCOPY     GASTRIC ROUX-EN-Y N/A 12/03/2019   Procedure: LAPAROSCOPIC ROUX-EN-Y GASTRIC BYPASS WITH UPPER ENDOSCOPY;  Surgeon: Luretha MurphyMartin, Matthew, MD;  Location[facility];  Service: General;  Laterality:  N/A;   HEEL SPUR SURGERY Left    LUMBAR LAMINECTOMY/DECOMPRESSION MICRODISCECTOMY  2022   Dr. Jordan LikesPool   NECK SURGERY     Titanium Plate in Vertebrae   SHOULDER SURGERY Right    TRIGGER FINGER RELEASE Right 05/14/2015   Procedure: RIGHT THUMB TRIGGER RELEASE ;  Surgeon: Betha LoaKevin Kuzma, MD;  Location: [facility];  Service: Orthopedics;  Laterality: Right;   UPPER GASTROINTESTINAL ENDOSCOPY      There were no vitals filed for this visit.   Subjective Assessment - 02/26/21 1059     Subjective Pt reports the episode of increased low back pain continued into the beginning of this week. Pt notes he is fellling better, but his low back is feeling tight.    Diagnostic tests 04/03/20 lumbar xray:    FINDINGS:  AP and lateral view intraoperative fluoroscopic images of the  lumbosacral spine are submitted, 2 images total. The images  demonstrate bilateral pedicle screws at the L5 and S1 levels.  Vertical interconnecting rods were not present at the time the  images were taken. An L5-S1 interbody device is also present.  Overlying retractors.    Patient Stated Goals Pt states wanting to be able to sleep through the  night and enjoy grandkids. To tolerate sitting longer and to have greater flexibility.    Currently in Pain? Yes    Pain Score 7     Pain Location Back    Pain Orientation Left    Pain Descriptors / Indicators Aching    Pain Onset More than a month ago    Pain Frequency Constant    Aggravating Factors  prolonged sitting, squating, stepping up, walking    Pain Relieving Factors cold and heat packs, TENs, stretches                               [facility] Adult PT Treatment/Exercise - 02/26/21 0001       Lumbar Exercises: Stretches   Active Hamstring Stretch Right;Left;3 reps;20 seconds    Lower Trunk Rotation 5 reps;10 seconds    Hip Flexor Stretch Right;Left;3 reps;30 seconds    Hip Flexor Stretch Limitations Thomas stretch    Piriformis Stretch Right;Left;2  reps;20 seconds    Figure 4 Stretch 2 reps;20 seconds    Other Lumbar Stretch Exercise QL stretch in sitting and standing x3, 20 sec each stretch    Other Lumbar Stretch Exercise trunk flexion forward and laterally c green TBall, 6x 30 sec      Modalities   Modalities Moist Heat      Moist Heat Therapy   Number Minutes Moist Heat 15 Minutes    Moist Heat Location Lumbar Spine      Manual Therapy   Soft tissue mobilization STM to the low back paraspinals. Increased muscle tension L vs R.                       PT Short Term Goals - 09/09/20 1325       PT SHORT TERM GOAL #2   Title Ptwill voice understanding of measures to assist in the management and reduction of pain. 09/09/20, pt uses multiple techniques to help manage his pain: heat, cold, TENS, masssage gun tennis ball massase    Status Achieved    Target Date 09/09/20               PT Long Term Goals - 02/17/21 1015       PT LONG TERM GOAL #1   Title Increase trunk AROMs by at least 25% to improve pt's ability to complete functional activities. 09/09/20-Pt trunks ROMs have increased min to mod except L side bending which is the same    Baseline See flowsheets    Status On-going    Target Date 03/29/21      PT LONG TERM GOAL #2   Title Increase bilat knee and hip strength to at least 4+/5 strength for improved function ability. 09/09/20- R knee and hip have increased to 5/5 and 4+/5 respecively. L knee strength has increased to 4+/5 and the L hip is 4/5, improved but lss than set goal. L knee and hip strengths are impacted by pain. 01/13/21: Bilat hip flexion strength was limited by pain in supine. In sitting hip flex = 4+/5 and In standing hip abd =4+/5 . R knee flexion  and ext = 5/5, L 4+/5.    Status Achieved    Target Date 01/14/21      PT LONG TERM GOAL #3   Title Pt will report 50% improvement in his ability to sleep. 09/09/20- pt reports 25% improvement in his sleep. 01/13/21: 255 improvement in sleep  quaility    Status On-going    Target Date 03/29/21      PT LONG TERM GOAL #4   Title Pt will report a decrease in his pain range with ADL to 4-7/10 for improved QOL. 09/09/20- 7-10/10 pain rating    Baseline 7-10/10    Status On-going    Target Date 03/29/21      PT LONG TERM GOAL #5   Title Pt's FOTO score will increase to he predicted value of 51% functional ability. 08/07/20- 45%. 10/16/20: 39%    Baseline 37%    Status On-going    Target Date 03/29/21      PT LONG TERM GOAL #6   Title Pt will be Ind in a final HEP to maintain or progress achieved LOF    Status On-going    Target Date 03/29/21      PT LONG TERM GOAL #7   Title Improve 5xSTS time to less than 16 sec as indication of improved function. 01/14/21; 13.3 sec s use of hands    Baseline 21.3 sec    Status Achieved    Target Date 01/14/21                   Plan - 02/26/21 1104     Clinical Impression Statement Pt presents reporting improved pain, but with low back tightness sill being more than usual. PT was completed with greater focus on stretching, Pt reported relief with QL stretching. STM was also completed with increase muscle tension L>R. Pt reported his low back felt looser after after stretching and STM. MH was provided to the low back at end of session. Pt will continue to benefit from skilled PT to manage low back pain and mobility while progressing strength and function.    Personal Factors and Comorbidities Time since onset of injury/illness/exacerbation;Comorbidity 1;Comorbidity 2;Comorbidity 3+;Finances    Comorbidities see medical Hx    Examination-Activity Limitations Lift;Squat;Stand;Locomotion Level;Transfers    Examination-Participation Restrictions Other    Stability/Clinical Decision Making Evolving/Moderate complexity    Clinical Decision Making Moderate    Rehab Potential Good    PT Frequency 1x / week    PT Duration 4 weeks    PT Treatment/Interventions ADLs/Self Care Home  Management;Aquatic Therapy;Electrical Stimulation;Iontophoresis 4mg /ml Dexamethasone;Moist Heat;Therapeutic exercise;Therapeutic activities;Functional mobility training;Stair training;Gait training;Patient/family education;Manual techniques;Spinal Manipulations;Taping;Dry needling;Balance training;Neuromuscular re-education    PT Next Visit Plan Progress strengthening exs to improve pt's ability to complete functional ability. Assess response to TPDN L1, L2 and L3 multifidi and iliocostalis. Continue to progress strengthening    PT Home Exercise Plan [email or web address]    Consulted and Agree with Plan of Care Patient             Patient will benefit from skilled therapeutic intervention in order to improve the following deficits and impairments:  Decreased range of motion, Difficulty walking, Decreased activity tolerance, Pain, Decreased mobility, Decreased strength  Visit Diagnosis: Spondylolisthesis, unspecified spinal region  Decreased ROM of lumbar spine  Chronic bilateral low back pain with left-sided sciatica  Difficulty in walking, not elsewhere classified     Problem List Patient Active Problem List   Diagnosis Date Noted   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 12/03/2019   Vitamin B 12 deficiency 12/09/2017   Chronic post-traumatic stress disorder (PTSD) after military combat 06/03/2017   Major depression, recurrent, chronic ([facility]) 01/01/2017   Chronic pain syndrome 09/03/2016   Insomnia 07/15/2015   Cervical spondylosis without myelopathy 02/06/2015   Obesity (BMI  30-39.9) 03/15/2013   BPH (benign prostatic hyperplasia) 11/15/2012   Type 2 diabetes mellitus with diabetic polyneuropathy, without long-term current use of insulin ([facility]) 12/22/2010   Chronic pain of both knees 12/08/2008   Hyperlipidemia associated with type 2 diabetes mellitus ([facility]), on statin 08/23/2006   Gout 08/23/2006   Obstructive sleep apnea 08/23/2006   Allergic rhinitis 08/23/2006   Chronic low back pain  08/23/2006   Seizure disorder ([facility]CC) 08/23/2006   History of CVA (cerebrovascular accident) 08/23/2006    Joellyn Rued MS, PT 02/26/21 11:15 AM   [facility] [address] [place], [place], [address] Phone: [phone]   Fax:  [phone]  Name: [name] MRN: [id number] Date of Birth: 03-04-1970

## 2021-03-05 ENCOUNTER — Other Ambulatory Visit: Payer: Self-pay

## 2021-03-05 ENCOUNTER — Ambulatory Visit: Payer: No Typology Code available for payment source | Attending: Neurosurgery

## 2021-03-05 DIAGNOSIS — M5442 Lumbago with sciatica, left side: Secondary | ICD-10-CM | POA: Diagnosis present

## 2021-03-05 DIAGNOSIS — G8929 Other chronic pain: Secondary | ICD-10-CM | POA: Insufficient documentation

## 2021-03-05 DIAGNOSIS — M4310 Spondylolisthesis, site unspecified: Secondary | ICD-10-CM | POA: Insufficient documentation

## 2021-03-05 DIAGNOSIS — M5386 Other specified dorsopathies, lumbar region: Secondary | ICD-10-CM | POA: Insufficient documentation

## 2021-03-05 DIAGNOSIS — R262 Difficulty in walking, not elsewhere classified: Secondary | ICD-10-CM | POA: Insufficient documentation

## 2021-03-05 NOTE — Therapy (Signed)
[facility] [address] [place], [place], [address] Phone: [phone]   Fax:  [phone]  Physical Therapy Treatment  Patient Details  Name: [name] R Mangione MRN: [id number] Date of Birth: 1970-07-20 Referring Provider (PT): Julio SicksPool, Henry, MD   Encounter Date: 03/05/2021   PT End of Session - 03/05/21 1215     Visit Number 29    Number of Visits 31    Authorization Type [facility] [employer]. 15 visits until 03/27/21    Authorization - Visit Number 28    Authorization - Number of Visits 30    PT Start Time 1150    PT Stop Time 1235    PT Time Calculation (min) 45 min    Activity Tolerance Patient tolerated treatment well    Behavior During Therapy WFL for tasks assessed/performed             Past Medical History:  Diagnosis Date   Adrenal abnormality (HCC)    Allergy    Anxiety    Arthritis    Atypical chest pain    BPH (benign prostatic hypertrophy)    Complication of anesthesia    Hard time waking up after neck surgery   Depression    GERD (gastroesophageal reflux disease)    "not since Gastric Bypass"   Gout    H/O: CVA (cardiovascular accident)    Hyperlipidemia    Knee pain, bilateral    LBP (low back pain)    Neuromuscular disorder (HCC)    OSA (obstructive sleep apnea)    Plantar fasciitis    PTSD (post-traumatic stress disorder)    Seizure disorder (HCC)    Sleep apnea    Stroke (HCC) 2001   some memory loss- math , science     Past Surgical History:  Procedure Laterality Date   CARPAL TUNNEL RELEASE Bilateral    COLONOSCOPY     ENDOSCOPIC PLANTAR FASCIOTOMY     ESOPHAGOGASTRODUODENOSCOPY     GASTRIC ROUX-EN-Y N/A 12/03/2019   Procedure: LAPAROSCOPIC ROUX-EN-Y GASTRIC BYPASS WITH UPPER ENDOSCOPY;  Surgeon: Luretha MurphyMartin, Matthew, MD;  Location: [facility];  Service: General;  Laterality: N/A;   HEEL SPUR SURGERY Left    LUMBAR LAMINECTOMY/DECOMPRESSION MICRODISCECTOMY  2022   Dr. Jordan LikesPool   NECK  SURGERY     Titanium Plate in Vertebrae   SHOULDER SURGERY Right    TRIGGER FINGER RELEASE Right 05/14/2015   Procedure: RIGHT THUMB TRIGGER RELEASE ;  Surgeon: Betha LoaKevin Kuzma, MD;  Location: [facility];  Service: Orthopedics;  Laterality: Right;   UPPER GASTROINTESTINAL ENDOSCOPY      There were no vitals filed for this visit.   Subjective Assessment - 03/05/21 1214     Subjective Pt reports his low back is bothering him more after lifting a sewing machine and bring out of the attic.    Limitations Sitting;Walking;Standing;Lifting    How long can you sit comfortably? 30 mins    How long can you stand comfortably? 30 mins    How long can you walk comfortably? .5 mile    Diagnostic tests 04/03/20 lumbar xray:    FINDINGS:  AP and lateral view intraoperative fluoroscopic images of the  lumbosacral spine are submitted, 2 images total. The images  demonstrate bilateral pedicle screws at the L5 and S1 levels.  Vertical interconnecting rods were not present at the time the  images were taken. An L5-S1 interbody device is also present.  Overlying retractors.    Patient Stated Goals Pt  states wanting to be able to sleep through the night and enjoy grandkids. To tolerate sitting longer and to have greater flexibility.    Currently in Pain? Yes    Pain Score 8     Pain Location Back    Pain Orientation Left    Pain Descriptors / Indicators Aching    Pain Type Chronic pain    Pain Onset More than a month ago    Pain Frequency Constant    Aggravating Factors  prolonged sitting, squating, stepping up, walking    Pain Relieving Factors cold and heat packs, TENs, stretches             Trigger Point Dry Needling Treatment: Skilled palpation of the lumbar paraspinals Pre-treatment instruction: Patient instructed on dry needling rationale, procedures, and possible side effects including pain during treatment (achy,cramping feeling), bruising, drop of blood, lightheadedness, nausea,  sweating. Patient Consent Given: Yes Education handout provided: Previously provided Muscles treated: Multifiidi, longissimus  Needle size and number: .30x92mm x 4 Electrical stimulation performed: Yes Parameters:  Micro 65uA, intensity as tolerated , 15 mins. Milli 37mA, intensity as tolerated with visible contraction, 10 mins. Treatment response/outcome: Twitch response elicited, Palpable decrease in muscle tension, and pt report of decreased tightness and increased  flexibility Post-treatment instructions: Patient instructed to expect possible mild to moderate muscle soreness later today and/or tomorrow. Patient instructed in methods to reduce muscle soreness and to continue prescribed HEP. If patient was dry needled over the lung field, patient was instructed on signs and symptoms of pneumothorax and, however unlikely, to see immediate medical attention should they occur. Patient was also educated on signs and symptoms of infection and to seek medical attention should they occur. Patient verbalized understanding of these instructions and education.               TPDN c eStim was f/b lumbar stretches    [facility] Adult PT Treatment/Exercise - 03/05/21 0001       Lumbar Exercises: Stretches   Active Hamstring Stretch Right;Left;3 reps;20 seconds    Lower Trunk Rotation 5 reps;10 seconds    Other Lumbar Stretch Exercise QL stretch in sitting and standing x3, 20 sec each stretch    Other Lumbar Stretch Exercise trunk flexion forward and laterally c green TBall, 6x 30 sec                       PT Short Term Goals - 09/09/20 1325       PT SHORT TERM GOAL #2   Title Ptwill voice understanding of measures to assist in the management and reduction of pain. 09/09/20, pt uses multiple techniques to help manage his pain: heat, cold, TENS, masssage gun tennis ball massase    Status Achieved    Target Date 09/09/20               PT Long Term Goals - 02/17/21 1015        PT LONG TERM GOAL #1   Title Increase trunk AROMs by at least 25% to improve pt's ability to complete functional activities. 09/09/20-Pt trunks ROMs have increased min to mod except L side bending which is the same    Baseline See flowsheets    Status On-going    Target Date 03/29/21      PT LONG TERM GOAL #2   Title Increase bilat knee and hip strength to at least 4+/5 strength for improved function ability. 09/09/20- R knee and hip have increased  to 5/5 and 4+/5 respecively. L knee strength has increased to 4+/5 and the L hip is 4/5, improved but lss than set goal. L knee and hip strengths are impacted by pain. 01/13/21: Bilat hip flexion strength was limited by pain in supine. In sitting hip flex = 4+/5 and In standing hip abd =4+/5 . R knee flexion  and ext = 5/5, L 4+/5.    Status Achieved    Target Date 01/14/21      PT LONG TERM GOAL #3   Title Pt will report 50% improvement in his ability to sleep. 09/09/20- pt reports 25% improvement in his sleep. 01/13/21: 255 improvement in sleep quaility    Status On-going    Target Date 03/29/21      PT LONG TERM GOAL #4   Title Pt will report a decrease in his pain range with ADL to 4-7/10 for improved QOL. 09/09/20- 7-10/10 pain rating    Baseline 7-10/10    Status On-going    Target Date 03/29/21      PT LONG TERM GOAL #5   Title Pt's FOTO score will increase to he predicted value of 51% functional ability. 08/07/20- 45%. 10/16/20: 39%    Baseline 37%    Status On-going    Target Date 03/29/21      PT LONG TERM GOAL #6   Title Pt will be Ind in a final HEP to maintain or progress achieved LOF    Status On-going    Target Date 03/29/21      PT LONG TERM GOAL #7   Title Improve 5xSTS time to less than 16 sec as indication of improved function. 01/14/21; 13.3 sec s use of hands    Baseline 21.3 sec    Status Achieved    Target Date 01/14/21                   Plan - 03/05/21 1555     Clinical Impression Statement PT was  completd for TPDN to the lumbar multifidi and longissimus muscles L2 - L4 in both the micro and milli modes. This was f/b lumbar stretches. Pt reported decreased tightness and improved flexibility. Will assess pt's longer range response next PT session.    Personal Factors and Comorbidities Time since onset of injury/illness/exacerbation;Comorbidity 1;Comorbidity 2;Comorbidity 3+;Finances    Comorbidities see medical Hx    Examination-Activity Limitations Lift;Squat;Stand;Locomotion Level;Transfers    Examination-Participation Restrictions Other    Stability/Clinical Decision Making Evolving/Moderate complexity    Clinical Decision Making Moderate    Rehab Potential Good    PT Frequency 1x / week    PT Duration 4 weeks    PT Treatment/Interventions ADLs/Self Care Home Management;Aquatic Therapy;Electrical Stimulation;Iontophoresis 4mg /ml Dexamethasone;Moist Heat;Therapeutic exercise;Therapeutic activities;Functional mobility training;Stair training;Gait training;Patient/family education;Manual techniques;Spinal Manipulations;Taping;Dry needling;Balance training;Neuromuscular re-education    PT Next Visit Plan Progress strengthening exs to improve pt's ability to complete functional ability. Assess response to TPDN c estim. Continue to progress strengthening    PT Home Exercise Plan [id number]    Consulted and Agree with Plan of Care Patient             Patient will benefit from skilled therapeutic intervention in order to improve the following deficits and impairments:  Decreased range of motion, Difficulty walking, Decreased activity tolerance, Pain, Decreased mobility, Decreased strength  Visit Diagnosis: Decreased ROM of lumbar spine  Chronic bilateral low back pain with left-sided sciatica  Difficulty in walking, not elsewhere classified  Spondylolisthesis, unspecified spinal region  Problem List Patient Active Problem List   Diagnosis Date Noted   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 12/03/2019   Vitamin B 12 deficiency 12/09/2017   Chronic post-traumatic stress disorder (PTSD) after military combat 06/03/2017   Major depression, recurrent, chronic (HCC) 01/01/2017   Chronic pain syndrome 09/03/2016   Insomnia 07/15/2015   Cervical spondylosis without myelopathy 02/06/2015   Obesity (BMI 30-39.9) 03/15/2013   BPH (benign prostatic hyperplasia) 11/15/2012   Type 2 diabetes mellitus with diabetic polyneuropathy, without long-term current use of insulin (HCC) 12/22/2010   Chronic pain of both knees 12/08/2008   Hyperlipidemia associated with type 2 diabetes mellitus (HCC), on statin 08/23/2006   Gout 08/23/2006   Obstructive sleep apnea 08/23/2006   Allergic rhinitis 08/23/2006   Chronic low back pain 08/23/2006   Seizure disorder (HCC) 08/23/2006   History of CVA (cerebrovascular accident) 08/23/2006    Joellyn Rued MS, PT 03/05/21 4:19 PM   [facility] [address] [place], [place], [address] Phone: [phone]   Fax:  [phone]  Name: ODYN TURKO MRN: [id number] Date of Birth: 12-Jan-1971

## 2021-03-11 ENCOUNTER — Other Ambulatory Visit: Payer: Self-pay

## 2021-03-11 ENCOUNTER — Encounter: Payer: Self-pay | Admitting: Family Medicine

## 2021-03-11 ENCOUNTER — Ambulatory Visit (INDEPENDENT_AMBULATORY_CARE_PROVIDER_SITE_OTHER): Payer: No Typology Code available for payment source | Admitting: Family Medicine

## 2021-03-11 VITALS — BP 119/89 | HR 88 | Temp 98.3°F | Ht 70.0 in | Wt 217.4 lb

## 2021-03-11 DIAGNOSIS — Z9884 Bariatric surgery status: Secondary | ICD-10-CM

## 2021-03-11 DIAGNOSIS — Z8673 Personal history of transient ischemic attack (TIA), and cerebral infarction without residual deficits: Secondary | ICD-10-CM

## 2021-03-11 DIAGNOSIS — G4701 Insomnia due to medical condition: Secondary | ICD-10-CM

## 2021-03-11 DIAGNOSIS — F339 Major depressive disorder, recurrent, unspecified: Secondary | ICD-10-CM

## 2021-03-11 DIAGNOSIS — G40909 Epilepsy, unspecified, not intractable, without status epilepticus: Secondary | ICD-10-CM

## 2021-03-11 DIAGNOSIS — G4733 Obstructive sleep apnea (adult) (pediatric): Secondary | ICD-10-CM

## 2021-03-11 DIAGNOSIS — Z0000 Encounter for general adult medical examination without abnormal findings: Secondary | ICD-10-CM | POA: Diagnosis not present

## 2021-03-11 DIAGNOSIS — G894 Chronic pain syndrome: Secondary | ICD-10-CM

## 2021-03-11 DIAGNOSIS — E1169 Type 2 diabetes mellitus with other specified complication: Secondary | ICD-10-CM | POA: Diagnosis not present

## 2021-03-11 DIAGNOSIS — E782 Mixed hyperlipidemia: Secondary | ICD-10-CM

## 2021-03-11 DIAGNOSIS — E1142 Type 2 diabetes mellitus with diabetic polyneuropathy: Secondary | ICD-10-CM | POA: Diagnosis not present

## 2021-03-11 DIAGNOSIS — E785 Hyperlipidemia, unspecified: Secondary | ICD-10-CM

## 2021-03-11 LAB — CBC WITH DIFFERENTIAL/PLATELET
Basophils Absolute: 0 10*3/uL (ref 0.0–0.1)
Basophils Relative: 0.7 % (ref 0.0–3.0)
Eosinophils Absolute: 0 10*3/uL (ref 0.0–0.7)
Eosinophils Relative: 1.2 % (ref 0.0–5.0)
HCT: 44.6 % (ref 39.0–52.0)
Hemoglobin: 14.3 g/dL (ref 13.0–17.0)
Lymphocytes Relative: 35.4 % (ref 12.0–46.0)
Lymphs Abs: 1.4 10*3/uL (ref 0.7–4.0)
MCHC: 32.2 g/dL (ref 30.0–36.0)
MCV: 80.5 fl (ref 78.0–100.0)
Monocytes Absolute: 0.3 10*3/uL (ref 0.1–1.0)
Monocytes Relative: 8.1 % (ref 3.0–12.0)
Neutro Abs: 2.1 10*3/uL (ref 1.4–7.7)
Neutrophils Relative %: 54.6 % (ref 43.0–77.0)
Platelets: 199 10*3/uL (ref 150.0–400.0)
RBC: 5.53 Mil/uL (ref 4.22–5.81)
RDW: 14.6 % (ref 11.5–15.5)
WBC: 3.9 10*3/uL — ABNORMAL LOW (ref 4.0–10.5)

## 2021-03-11 LAB — COMPREHENSIVE METABOLIC PANEL
ALT: 27 U/L (ref 0–53)
AST: 23 U/L (ref 0–37)
Albumin: 4.2 g/dL (ref 3.5–5.2)
Alkaline Phosphatase: 43 U/L (ref 39–117)
BUN: 10 mg/dL (ref 6–23)
CO2: 33 mEq/L — ABNORMAL HIGH (ref 19–32)
Calcium: 9.4 mg/dL (ref 8.4–10.5)
Chloride: 104 mEq/L (ref 96–112)
Creatinine, Ser: 0.9 mg/dL (ref 0.40–1.50)
GFR: 98.72 mL/min (ref >60.00)
Glucose, Bld: 88 mg/dL (ref 70–99)
Potassium: 4.6 mEq/L (ref 3.5–5.1)
Sodium: 141 mEq/L (ref 135–145)
Total Bilirubin: 0.5 mg/dL (ref 0.2–1.2)
Total Protein: 7 g/dL (ref 6.0–8.3)

## 2021-03-11 LAB — HEMOGLOBIN A1C: Hgb A1c MFr Bld: 6.4 % (ref 4.6–6.5)

## 2021-03-11 LAB — MICROALBUMIN / CREATININE URINE RATIO
Creatinine,U: 231.9 mg/dL
Microalb Creat Ratio: 0.4 mg/g (ref 0.0–30.0)
Microalb, Ur: 1 mg/dL (ref 0.0–1.9)

## 2021-03-11 LAB — VITAMIN B12: Vitamin B-12: 761 pg/mL (ref 211–911)

## 2021-03-11 LAB — LIPID PANEL
Cholesterol: 135 mg/dL (ref 0–200)
HDL: 62.9 mg/dL (ref >39.00)
LDL Cholesterol: 60 mg/dL (ref 0–99)
NonHDL: 71.71
Total CHOL/HDL Ratio: 2
Triglycerides: 61 mg/dL (ref 0.0–149.0)
VLDL: 12.2 mg/dL (ref 0.0–40.0)

## 2021-03-11 LAB — TSH: TSH: 1.09 u[IU]/mL (ref 0.35–5.50)

## 2021-03-11 LAB — VITAMIN D 25 HYDROXY (VIT D DEFICIENCY, FRACTURES): VITD: 41.12 ng/mL (ref 30.00–100.00)

## 2021-03-11 NOTE — Patient Instructions (Signed)
Please return in 6 months to recheck diabetes.   I will refill your crestor after the lipid results return to make sure it is at the correct dose.  I will release your lab results to you on your MyChart account with further instructions. Please reply with any questions.    Take care of yourself.  If you have any questions or concerns, please don't hesitate to send me a message via MyChart or call the office at [phone]. Thank you for visiting with [place] today! It's our pleasure caring for you.   Please do these things to maintain good health!  Exercise at least 30-45 minutes a day,  4-5 days a week.  Eat a low-fat diet with lots of fruits and vegetables, up to 7-9 servings per day. Drink plenty of water daily. Try to drink 8 8oz glasses per day. Seatbelts can save your life. Always wear your seatbelt. Place Smoke Detectors on every level of your home and check batteries every year. Eye Doctor - have an eye exam every 1-2 years Safe sex - use condoms to protect yourself from STDs if you could be exposed to these types of infections. Avoid heavy alcohol use. If you drink, keep it to less than 2 drinks/day and not every day. [name].  Choose someone you trust that could speak for you if you became unable to speak for yourself. Depression is common in our stressful world.If you're feeling down or losing interest in things you normally enjoy, please come in for a visit.

## 2021-03-11 NOTE — Progress Notes (Signed)
Subjective  Chief Complaint  Patient presents with   Annual Exam    Fasting    HPI: [name] is a 51 y.o. male who presents to [facility] [place] today for a Male Wellness Visit. He also has the concerns and/or needs as listed above in the chief complaint. These will be addressed in addition to the Health Maintenance Visit.   Wellness Visit: annual visit with health maintenance review and exam   Health maintenance: Had colonoscopy which was normal last year.  All immunizations are current.  Eye exam up-to-date without retinopathy.  Physically feeling well.  Body mass index is 31.19 kg/m. Wt Readings from Last 3 Encounters:  03/11/21 217 lb 6.4 oz (98.6 kg)  01/07/21 215 lb (97.5 kg)  12/22/20 215 lb (97.5 kg)     Chronic disease management visit and/or acute problem visit: Depression and PTSD: He had breakdown a few months ago.  Reviewed notes.  Seeing therapist bimonthly.  Much improved.  Continues on mood medicines. Diabetes: Diet controlled.  Continues on diabetic diet.  Weight is stable.  Feels well without symptoms of hypoglycemia.  Occasional paresthesias in feet are not painful.  No sores. Hyperlipidemia: Takes Crestor 20 mg nightly.  Due for refill.  Fasting for recheck today. Obstructive sleep apnea: Continues on CPAP.  Has follow-up with pulmonology next year.  At that time we will recheck a sleep study and see if he still needs it.  Sleep apnea is mild since weight loss. Chronic low back pain on chronic narcotics per pain management: Unfortunately this persist even after his back surgery last year.  Seeing a chiropractor and physical therapy currently. History of gastric bypass: Continues on vitamin supplements.  Weight is stable.  No nausea, vomiting, hematemesis.  Patient Active Problem List   Diagnosis Date Noted   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 12/03/2019   Chronic post-traumatic stress disorder (PTSD) after military combat  06/03/2017   Major depression, recurrent, chronic (HCC) 01/01/2017   Chronic pain syndrome 09/03/2016   Obesity (BMI 30-39.9) 03/15/2013   Type 2 diabetes mellitus with diabetic polyneuropathy, without long-term current use of insulin (HCC) 12/22/2010   Hyperlipidemia associated with type 2 diabetes mellitus (HCC), on statin 08/23/2006   Obstructive sleep apnea 08/23/2006   Chronic low back pain 08/23/2006   Seizure disorder (HCC) 08/23/2006   History of CVA (cerebrovascular accident) 08/23/2006   Insomnia 07/15/2015   Cervical spondylosis without myelopathy 02/06/2015   BPH (benign prostatic hyperplasia) 11/15/2012   Chronic pain of both knees 12/08/2008   Gout 08/23/2006   Vitamin B 12 deficiency 12/09/2017   Allergic rhinitis 08/23/2006   Health Maintenance  Topic Date Due   URINE MICROALBUMIN  03/05/2021   HEMOGLOBIN A1C  04/07/2021   OPHTHALMOLOGY EXAM  06/09/2021   FOOT EXAM  03/11/2022   COLONOSCOPY (Pts 45-65yrs Insurance coverage will need to be confirmed)  01/08/2031   TETANUS/TDAP  02/20/2031   INFLUENZA VACCINE  Completed   COVID-19 Vaccine  Completed   Hepatitis C Screening  Completed   HIV Screening  Completed   Zoster Vaccines- Shingrix  Completed   HPV VACCINES  Aged Out   Immunization History  Administered Date(s) Administered   Influenza Split 09/13/2011   Influenza Whole 11/18/2008   Influenza,inj,Quad PF,6+ Mos 11/15/2012, 11/12/2013, 09/26/2016, 11/21/2017, 11/19/2018, 12/11/2019, 10/08/2020, 02/19/2021   Moderna Covid-19 Vaccine Bivalent Booster 46yrs & up 10/22/2020   PFIZER(Purple Top)SARS-COV-2 Vaccination 04/14/2019, 05/12/2019, 01/05/2020, 05/23/2020  PNEUMOCOCCAL CONJUGATE-20 10/22/2020   Pneumococcal Polysaccharide-23 12/22/2010   Td 03/16/2009, 02/19/2021   Tdap 02/17/2011, 02/19/2020   Zoster Recombinat (Shingrix) 07/05/2019, 01/10/2020   We updated and reviewed the patient's past history in detail and it is documented  below. Allergies: Patient is allergic to atorvastatin, pregabalin, tizanidine, and cymbalta [duloxetine hcl]. Past Medical History  has a past medical history of Adrenal abnormality ([place]), Allergy, Anxiety, Arthritis, Atypical chest pain, BPH (benign prostatic hypertrophy), Complication of anesthesia, Depression, GERD (gastroesophageal reflux disease), Gout, H/O: CVA (cardiovascular accident), Hyperlipidemia, Knee pain, bilateral, LBP (low back pain), Neuromuscular disorder ([place]), OSA (obstructive sleep apnea), Plantar fasciitis, PTSD (post-traumatic stress disorder), Seizure disorder ([place]), Sleep apnea, and Stroke ([place]) (2001). Past Surgical History Patient  has a past surgical history that includes Trigger finger release (Right, 05/14/2015); Neck surgery; Shoulder surgery (Right); Heel spur surgery (Left); Endoscopic plantar fasciotomy; Carpal tunnel release (Bilateral); Gastric Roux-En-Y (N/A, 12/03/2019); Colonoscopy; Esophagogastroduodenoscopy; Lumbar laminectomy/decompression microdiscectomy (2022); and Upper gastrointestinal endoscopy. Social History Patient  reports that he quit smoking about 30 years ago. His smoking use included cigarettes. He has never used smokeless tobacco. He reports that he does not drink alcohol and does not use drugs. Family History family history includes Allergies in his daughter; Cancer in his maternal uncle; Diabetes in his father; Heart attack in his maternal grandfather; Hypertension in his father. Review of Systems: Constitutional: negative for fever or malaise Ophthalmic: negative for photophobia, double vision or loss of vision Cardiovascular: negative for chest pain, dyspnea on exertion, or new LE swelling Respiratory: negative for SOB or persistent cough Gastrointestinal: negative for abdominal pain, change in bowel habits or melena Genitourinary: negative for dysuria or gross hematuria Musculoskeletal: negative for new gait disturbance or muscular  weakness Integumentary: negative for new or persistent rashes Neurological: negative for TIA or stroke symptoms Psychiatric: negative for SI or delusions Allergic/Immunologic: negative for hives  Patient Care Team    Relationship Specialty Notifications Start End  Leamon Arnt, MD PCP - General Family Medicine  02/27/19   Surgery, [facility] Surgery  04/08/19   Dian Situ, MD Consulting Physician Pain Medicine  05/21/19   Earnie Larsson, MD Consulting Physician Neurosurgery  06/03/20   Irene Shipper, MD Consulting Physician Gastroenterology  06/03/20    Objective  Vitals: BP 119/89    Pulse 88    Temp 98.3 F (36.8 C)    Ht 5\' 10"  (1.778 m)    Wt 217 lb 6.4 oz (98.6 kg)    SpO2 98%    BMI 31.19 kg/m  General:  Well developed, well nourished, no acute distress, appears well Psych:  Alert and orientedx3,normal mood and affect HEENT:  Normocephalic, atraumatic, non-icteric sclera, PERRL, oropharynx is clear without mass or exudate, supple neck without adenopathy, mass or thyromegaly Cardiovascular:  Normal S1, S2, RRR without gallop, rub or murmur, nondisplaced PMI, +2 distal pulses in bilateral upper and lower extremities. Respiratory:  Good breath sounds bilaterally, CTAB with normal respiratory effort Gastrointestinal: normal bowel sounds, soft, non-tender, no noted masses. No HSM MSK: no deformities, contusions. Joints are without erythema or swelling. Spine and CVA region are nontender Skin:  Warm, no rashes or suspicious lesions noted Neurologic:    Mental status is normal. CN 2-11 are normal. Gross motor and sensory exams are normal. Stable gait. No tremor GU: No inguinal hernias or adenopathy are appreciated bilaterally Diabetic Foot Exam: Appearance - no lesions, ulcers or calluses Skin - no sigificant pallor or erythema Monofilament testing -  sensitive bilaterally in following locations:  Right - Great toe, medial, central, lateral ball and posterior foot  intact  Left - Great toe, medial, central, lateral ball and posterior foot intact Pulses - +2 distally bilaterally    Assessment  1. Annual physical exam   2. Type 2 diabetes mellitus with diabetic polyneuropathy, without long-term current use of insulin ([place])   3. Hyperlipidemia associated with type 2 diabetes mellitus ([place]), on statin   4. History of CVA (cerebrovascular accident)   5. Seizure disorder ([place])   6. Obstructive sleep apnea   7. Major depression, recurrent, chronic ([place])   8. Chronic pain syndrome   9. Insomnia due to medical condition   10. 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   11. Mixed hyperlipidemia      Plan  Male Wellness Visit: Age appropriate Health Maintenance and Prevention measures were discussed with patient. Included topics are cancer screening recommendations, ways to keep healthy (see AVS) including dietary and exercise recommendations, regular eye and dental care, use of seat belts, and avoidance of moderate alcohol use and tobacco use.  Screens are current BMI: discussed patient's BMI and encouraged positive lifestyle modifications to help get to or maintain a target BMI. HM needs and immunizations were addressed and ordered. See below for orders. See HM and immunization section for updates.  Up-to-date Routine labs and screening tests ordered including cmp, cbc and lipids where appropriate. Discussed recommendations regarding Vit D and calcium supplementation (see AVS)  Chronic disease f/u and/or acute problem visit: (deemed necessary to be done in addition to the wellness visit): Diet-controlled diabetes: Check A1c today.  Continue diet, foot care.  Check urine microalbuminuria, he is normotensive and not on an ACE inhibitor.  Monitor for worsening paresthesias.  Possible early neuropathy Hyperlipidemia on Crestor: Recheck fasting levels today.  Goal LDL less than 70. History of CVA on aspirin: Mental health: Improving.  Tension headaches discussed.   Tylenol as needed.  No red flag symptoms.  Continue therapy and medications. Sleep apnea on CPAP.  Follow-up in 1 year Chronic pain and insomnia: Managed per pain management.  Reviewed medications.  Reviewed notes. History of gastric bypass: Monitor vitamin levels.  Follow up: Return in about 6 months (around 09/08/2021) for follow up Diabetes.  Commons side effects, risks, benefits, and alternatives for medications and treatment plan prescribed today were discussed, and the patient expressed understanding of the given instructions. Patient is instructed to call or message via MyChart if he/she has any questions or concerns regarding our treatment plan. No barriers to understanding were identified. We discussed Red Flag symptoms and signs in detail. Patient expressed understanding regarding what to do in case of urgent or emergency type symptoms.  Medication list was reconciled, printed and provided to the patient in AVS. Patient instructions and summary information was reviewed with the patient as documented in the AVS. This note was prepared with assistance of Dragon voice recognition software. Occasional wrong-word or sound-a-like substitutions may have occurred due to the inherent limitations of voice recognition software  This visit occurred during the SARS-CoV-2 public health emergency.  Safety protocols were in place, including screening questions prior to the visit, additional usage of staff PPE, and extensive cleaning of exam room while observing appropriate contact time as indicated for disinfecting solutions.   Orders Placed This Encounter  Procedures   Comprehensive metabolic panel   CBC with Differential/Platelet   Lipid panel   TSH   Microalbumin / creatinine urine ratio  Hemoglobin A1c   Vitamin B12   VITAMIN D 25 Hydroxy (Vit-D Deficiency, Fractures)   No orders of the defined types were placed in this encounter.

## 2021-03-12 ENCOUNTER — Ambulatory Visit: Payer: No Typology Code available for payment source

## 2021-03-12 DIAGNOSIS — M5386 Other specified dorsopathies, lumbar region: Secondary | ICD-10-CM

## 2021-03-12 DIAGNOSIS — R262 Difficulty in walking, not elsewhere classified: Secondary | ICD-10-CM

## 2021-03-12 DIAGNOSIS — M4310 Spondylolisthesis, site unspecified: Secondary | ICD-10-CM

## 2021-03-12 DIAGNOSIS — M5442 Lumbago with sciatica, left side: Secondary | ICD-10-CM

## 2021-03-12 DIAGNOSIS — G8929 Other chronic pain: Secondary | ICD-10-CM

## 2021-03-12 NOTE — Therapy (Signed)
[facility] [address] [place], [place], [address] Phone: [phone]   Fax:  [phone]  Physical Therapy Treatment/Progress Note  Patient Details  Name: Vicenta AlyKeifer R Housewright MRN: [id number] Date of Birth: [birth year] Referring Provider (PT): Julio SicksPool, Henry, MD  Progress Note Reporting Period 01/15/22 to 03/12/21  See note below for Objective Data and Assessment of Progress/Goals.      Encounter Date: 03/12/2021   PT End of Session - 03/12/21 0830     Visit Number 30    Number of Visits 31    Authorization Type [facility]. 15 visits until 03/27/21    Authorization - Visit Number 29    Authorization - Number of Visits 30    Progress Note Due on Visit 20    PT Start Time 0805    PT Stop Time 0850    PT Time Calculation (min) 45 min    Activity Tolerance Patient tolerated treatment well    Behavior During Therapy WFL for tasks assessed/performed             Past Medical History:  Diagnosis Date   Adrenal abnormality (HCC)    Allergy    Anxiety    Arthritis    Atypical chest pain    BPH (benign prostatic hypertrophy)    Complication of anesthesia    Hard time waking up after neck surgery   Depression    GERD (gastroesophageal reflux disease)    "not since Gastric Bypass"   Gout    H/O: CVA (cardiovascular accident)    Hyperlipidemia    Knee pain, bilateral    LBP (low back pain)    Neuromuscular disorder (HCC)    OSA (obstructive sleep apnea)    Plantar fasciitis    PTSD (post-traumatic stress disorder)    Seizure disorder (HCC)    Sleep apnea    Stroke (HCC) 2001   some memory loss- math , science     Past Surgical History:  Procedure Laterality Date   CARPAL TUNNEL RELEASE Bilateral    COLONOSCOPY     ENDOSCOPIC PLANTAR FASCIOTOMY     ESOPHAGOGASTRODUODENOSCOPY     GASTRIC ROUX-EN-Y N/A 12/03/2019   Procedure: LAPAROSCOPIC ROUX-EN-Y GASTRIC BYPASS WITH UPPER ENDOSCOPY;  Surgeon:  Luretha MurphyMartin, Matthew, MD;  Location: [facility];  Service: General;  Laterality: N/A;   HEEL SPUR SURGERY Left    LUMBAR LAMINECTOMY/DECOMPRESSION MICRODISCECTOMY  2022   Dr. Jordan LikesPool   NECK SURGERY     Titanium Plate in Vertebrae   SHOULDER SURGERY Right    TRIGGER FINGER RELEASE Right 05/14/2015   Procedure: RIGHT THUMB TRIGGER RELEASE ;  Surgeon: Betha LoaKevin Kuzma, MD;  Location: [facility];  Service: Orthopedics;  Laterality: Right;   UPPER GASTROINTESTINAL ENDOSCOPY      There were no vitals filed for this visit.   Subjective Assessment - 03/12/21 0825     Subjective Pt reports the TPDN with estim provided some relief and would like to try it again. He notes he woke up in a little more pain today feeling he slpet in a bad position.    Diagnostic tests 04/03/20 lumbar xray:    FINDINGS:  AP and lateral view intraoperative fluoroscopic images of the  lumbosacral spine are submitted, 2 images total. The images  demonstrate bilateral pedicle screws at the L5 and S1 levels.  Vertical interconnecting rods were not present at the time the  images were taken. An L5-S1 interbody device is also present.  Overlying  retractors.    Patient Stated Goals Pt states wanting to be able to sleep through the night and enjoy grandkids. To tolerate sitting longer and to have greater flexibility.    Currently in Pain? Yes    Pain Score 7     Pain Location Back    Pain Orientation Left    Pain Descriptors / Indicators Aching    Pain Type Chronic pain    Pain Onset More than a month ago    Pain Frequency Constant    Aggravating Factors  prolonged sitting, squating, stepping up, walking    Pain Relieving Factors cold and heat packs, TENs, stretches             Trigger Point Dry Needling Treatment: Skilled palpation of the lumbar paraspinals Pre-treatment instruction: Patient instructed on dry needling rationale, procedures, and possible side effects including pain during treatment (achy,cramping feeling),  bruising, drop of blood, lightheadedness, nausea, sweating. Patient Consent Given: Yes Education handout provided: Previously provided Muscles treated: Multifiidi, longissimus  Needle size and number: .30x20mm x 4 Electrical stimulation performed: Yes Parameters:  Micro 80uA, intensity as tolerated , 15 mins. Milli 70mA, intensity as tolerated with visible contraction, 10 mins. Treatment response/outcome: Twitch response elicited, Palpable decrease in muscle tension, and pt report of decreased tightness and increased  flexibility Post-treatment instructions: Patient instructed to expect possible mild to moderate muscle soreness later today and/or tomorrow. Patient instructed in methods to reduce muscle soreness and to continue prescribed HEP. If patient was dry needled over the lung field, patient was instructed on signs and symptoms of pneumothorax and, however unlikely, to see immediate medical attention should they occur. Patient was also educated on signs and symptoms of infection and to seek medical attention should they occur. Patient verbalized understanding of these instructions and education.     TPDN c eStim was f/b lumbar stretches                  [facility] Adult PT Treatment/Exercise - 03/12/21 0001       Lumbar Exercises: Stretches   Active Hamstring Stretch Right;Left;3 reps;20 seconds    Lower Trunk Rotation 5 reps;10 seconds    Other Lumbar Stretch Exercise QL stretch in sitting and standing x3, 20 sec each stretch    Other Lumbar Stretch Exercise trunk flexion forward and laterally c green TBall, 6x 30 sec                       PT Short Term Goals - 09/09/20 1325       PT SHORT TERM GOAL #2   Title Ptwill voice understanding of measures to assist in the management and reduction of pain. 09/09/20, pt uses multiple techniques to help manage his pain: heat, cold, TENS, masssage gun tennis ball massase    Status Achieved    Target Date 09/09/20                PT Long Term Goals - 02/17/21 1015       PT LONG TERM GOAL #1   Title Increase trunk AROMs by at least 25% to improve pt's ability to complete functional activities. 09/09/20-Pt trunks ROMs have increased min to mod except L side bending which is the same    Baseline See flowsheets    Status On-going    Target Date 03/29/21      PT LONG TERM GOAL #2   Title Increase bilat knee and hip strength to at least 4+/5  strength for improved function ability. 09/09/20- R knee and hip have increased to 5/5 and 4+/5 respecively. L knee strength has increased to 4+/5 and the L hip is 4/5, improved but lss than set goal. L knee and hip strengths are impacted by pain. 01/13/21: Bilat hip flexion strength was limited by pain in supine. In sitting hip flex = 4+/5 and In standing hip abd =4+/5 . R knee flexion  and ext = 5/5, L 4+/5.    Status Achieved    Target Date 01/14/21      PT LONG TERM GOAL #3   Title Pt will report 50% improvement in his ability to sleep. 09/09/20- pt reports 25% improvement in his sleep. 01/13/21: 255 improvement in sleep quaility    Status On-going    Target Date 03/29/21      PT LONG TERM GOAL #4   Title Pt will report a decrease in his pain range with ADL to 4-7/10 for improved QOL. 09/09/20- 7-10/10 pain rating    Baseline 7-10/10    Status On-going    Target Date 03/29/21      PT LONG TERM GOAL #5   Title Pt's FOTO score will increase to he predicted value of 51% functional ability. 08/07/20- 45%. 10/16/20: 39%    Baseline 37%    Status On-going    Target Date 03/29/21      PT LONG TERM GOAL #6   Title Pt will be Ind in a final HEP to maintain or progress achieved LOF    Status On-going    Target Date 03/29/21      PT LONG TERM GOAL #7   Title Improve 5xSTS time to less than 16 sec as indication of improved function. 01/14/21; 13.3 sec s use of hands    Baseline 21.3 sec    Status Achieved    Target Date 01/14/21                   Plan -  03/12/21 0845     Clinical Impression Statement PT was completed for TPDN c EStim to address low back pain and tightness. This was applied to the lumbar multifidi and longissimus muscles L2 - L4 with both the micro and milli modes. This was f/b lumbar stretches. Following the last PT session using TPDN c EStim pt's reports temproary improvement with pain level and improved flexibility. Overall, pt is tolerating increased ability to complete more demanding activity, with pain managed but at a high level. Will complete reassessment next visit anticipating DC to a home HEP.    Personal Factors and Comorbidities Time since onset of injury/illness/exacerbation;Comorbidity 1;Comorbidity 2;Comorbidity 3+;Finances    Comorbidities see medical Hx    Examination-Activity Limitations Lift;Squat;Stand;Locomotion Level;Transfers    Examination-Participation Restrictions Other    Stability/Clinical Decision Making Evolving/Moderate complexity    Clinical Decision Making Moderate    Rehab Potential Good    PT Frequency 1x / week    PT Duration 4 weeks    PT Treatment/Interventions ADLs/Self Care Home Management;Aquatic Therapy;Electrical Stimulation;Iontophoresis 4mg /ml Dexamethasone;Moist Heat;Therapeutic exercise;Therapeutic activities;Functional mobility training;Stair training;Gait training;Patient/family education;Manual techniques;Spinal Manipulations;Taping;Dry needling;Balance training;Neuromuscular re-education    PT Next Visit Plan Progress strengthening exs to improve pt's ability to complete functional ability. Assess response to TPDN c estim. Continue to progress strengthening    PT Home Exercise Plan [email or web address]    Consulted and Agree with Plan of Care Patient             Patient will benefit from skilled  therapeutic intervention in order to improve the following deficits and impairments:  Decreased range of motion, Difficulty walking, Decreased activity tolerance, Pain, Decreased mobility,  Decreased strength  Visit Diagnosis: Decreased ROM of lumbar spine  Chronic bilateral low back pain with left-sided sciatica  Difficulty in walking, not elsewhere classified  Spondylolisthesis, unspecified spinal region     Problem List Patient Active Problem List   Diagnosis Date Noted   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 12/03/2019   Vitamin B 12 deficiency 12/09/2017   Chronic post-traumatic stress disorder (PTSD) after military combat 06/03/2017   Major depression, recurrent, chronic (HCC) 01/01/2017   Chronic pain syndrome 09/03/2016   Insomnia 07/15/2015   Cervical spondylosis without myelopathy 02/06/2015   Obesity (BMI 30-39.9) 03/15/2013   BPH (benign prostatic hyperplasia) 11/15/2012   Type 2 diabetes mellitus with diabetic polyneuropathy, without long-term current use of insulin (HCC) 12/22/2010   Chronic pain of both knees 12/08/2008   Hyperlipidemia associated with type 2 diabetes mellitus (HCC), on statin 08/23/2006   Gout 08/23/2006   Obstructive sleep apnea 08/23/2006   Allergic rhinitis 08/23/2006   Chronic low back pain 08/23/2006   Seizure disorder (HCC) 08/23/2006   History of CVA (cerebrovascular accident) 08/23/2006   Joellyn Rued MS, PT 03/12/21 1:18 PM  [facility] [address] [place], [place], [address] Phone: [phone]   Fax:  [phone]  Name: JERRON NIBLACK MRN: [id number] Date of Birth: 11-10-1970

## 2021-03-19 ENCOUNTER — Other Ambulatory Visit: Payer: Self-pay

## 2021-03-19 ENCOUNTER — Ambulatory Visit: Payer: No Typology Code available for payment source

## 2021-03-19 DIAGNOSIS — M5386 Other specified dorsopathies, lumbar region: Secondary | ICD-10-CM

## 2021-03-19 DIAGNOSIS — M4310 Spondylolisthesis, site unspecified: Secondary | ICD-10-CM

## 2021-03-19 DIAGNOSIS — G8929 Other chronic pain: Secondary | ICD-10-CM

## 2021-03-19 DIAGNOSIS — R262 Difficulty in walking, not elsewhere classified: Secondary | ICD-10-CM

## 2021-03-19 NOTE — Therapy (Signed)
[place], [place], [address] Phone: [phone]   Fax:  [phone]  Physical Therapy Treatment/Discharge Patient Details  Name: [name] MRN: [id number] Date of Birth: 01-05-1971 Referring Provider (PT): Earnie Larsson, MD   Encounter Date: 03/19/2021   PT End of Session - 03/20/21 1426     Visit Number 31    Number of Visits 31    Date for PT Re-Evaluation 02/12/21    Authorization Type [facility] [place]. 15 visits until 03/27/21    Authorization - Visit Number 81    Authorization - Number of Visits 65    PT Start Time 0805    PT Stop Time 0623    PT Time Calculation (min) 41 min    Activity Tolerance Patient tolerated treatment well    Behavior During Therapy WFL for tasks assessed/performed             Past Medical History:  Diagnosis Date   Adrenal abnormality (HCC)    Allergy    Anxiety    Arthritis    Atypical chest pain    BPH (benign prostatic hypertrophy)    Complication of anesthesia    Hard time waking up after neck surgery   Depression    GERD (gastroesophageal reflux disease)    "not since Gastric Bypass"   Gout    H/O: CVA (cardiovascular accident)    Hyperlipidemia    Knee pain, bilateral    LBP (low back pain)    Neuromuscular disorder (HCC)    OSA (obstructive sleep apnea)    Plantar fasciitis    PTSD (post-traumatic stress disorder)    Seizure disorder ([place])    Sleep apnea    Stroke ([place]) 2001   some memory loss- math , science     Past Surgical History:  Procedure Laterality Date   CARPAL TUNNEL RELEASE Bilateral    COLONOSCOPY     ENDOSCOPIC PLANTAR FASCIOTOMY     ESOPHAGOGASTRODUODENOSCOPY     GASTRIC ROUX-EN-Y N/A 12/03/2019   Procedure: LAPAROSCOPIC ROUX-EN-Y GASTRIC BYPASS WITH UPPER ENDOSCOPY;  Surgeon: Johnathan Hausen, MD;  Location: [facility];  Service: General;  Laterality: N/A;   HEEL SPUR SURGERY Left    LUMBAR  LAMINECTOMY/DECOMPRESSION MICRODISCECTOMY  2022   Dr. Annette Stable   NECK SURGERY     Titanium Plate in Vertebrae   SHOULDER SURGERY Right    TRIGGER FINGER RELEASE Right 05/14/2015   Procedure: RIGHT THUMB TRIGGER RELEASE ;  Surgeon: Leanora Cover, MD;  Location: [place] [facility];  Service: Orthopedics;  Laterality: Right;   UPPER GASTROINTESTINAL ENDOSCOPY      There were no vitals filed for this visit.     TREATMENT 03/19/21:  Therapeutic Exercise: - Nustep 8 mins UE/LE - Trunk flexibility and ROM assessment for flexion, extension, L and R lateral flexion, and L and R rotation - LE manual strengthening and assessment  Self-care/Home Management: - See Education below                           PT Education - 03/20/21 1428     Education Details Final HEP for flexibility and strengthening    Person(s) Educated Patient    Methods Explanation;Handout    Comprehension Verbalized understanding              PT Short Term Goals - 09/09/20 1325       PT SHORT TERM GOAL #2  Title Ptwill voice understanding of measures to assist in the management and reduction of pain. 09/09/20, pt uses multiple techniques to help manage his pain: heat, cold, TENS, masssage gun tennis ball massase    Status Achieved    Target Date 09/09/20               PT Long Term Goals - 03/20/21 1438       PT LONG TERM GOAL #1   Title Increase trunk AROMs by at least 25% to improve pt's ability to complete functional activities. 09/09/20-Pt trunks ROMs have increased min to mod except L side bending which is the same. 03/19/21: L side bending L continues to moderately limited.    Baseline See flowsheets    Status Partially Met    Target Date 03/19/21      PT LONG TERM GOAL #2   Title Increase bilat knee and hip strength to at least 4+/5 strength for improved function ability. 09/09/20- R knee and hip have increased to 5/5 and 4+/5 respecively. L knee strength has increased to  4+/5 and the L hip is 4/5, improved but lss than set goal. L knee and hip strengths are impacted by pain. 01/13/21: Bilat hip flexion strength was limited by pain in supine. In sitting hip flex = 4+/5 and In standing hip abd =4+/5 . R knee flexion  and ext = 5/5, L 4+/5.    Status Achieved    Target Date 01/14/21      PT LONG TERM GOAL #3   Title Pt will report 50% improvement in his ability to sleep. 09/09/20- pt reports 25% improvement in his sleep. 01/13/21: 25% improvement in sleep quaility. 03/19/21: Pt reports his sleeping with less pain has not noticably improved.    Status Not Met    Target Date 03/19/21      PT LONG TERM GOAL #4   Title Pt will report a decrease in his pain range with ADL to 4-7/10 for improved QOL. 09/09/20- 7-10/10 pain rating. 03/19/21: 6-10    Baseline 7-10/10    Status Achieved    Target Date 03/19/21      PT LONG TERM GOAL #5   Title Pt's FOTO score will increase to he predicted value of 51% functional ability. 08/07/20- 45%. 10/16/20: 39% 03/19/21: 35%    Baseline 37%    Status Not Met    Target Date 03/19/21      PT LONG TERM GOAL #6   Title Pt will be Ind in a final HEP to maintain or progress achieved LOF    Status Achieved    Target Date 03/19/21      PT LONG TERM GOAL #7   Title Improve 5xSTS time to less than 16 sec as indication of improved function. 01/14/21; 13.3 sec s use of hands    Baseline 21.3 sec    Status Achieved    Target Date 01/14/21                   Plan - 03/20/21 1456     Clinical Impression Statement Pt participated in PT to address pain, flexibility, strength and function. Over the course of PT, the pt's ability to complete strengthening and activities of getter physical demand increased with pt's experience varying from tolerating these progressions or experiencing aggravated pain which would resolve. Pt is confident of his ability to continue resistance exercise and activity training in a gym setting or with his HEP. Pt  was also instructed in  and completed flexibility exs which the pt reports are helpful in managing his pain and inflexibility on a daily basis. PT interventions of TPDN and Estim provided tempoary relief of pain and tightness, but no overall reduction in pain. Overall, pt continues to report moderate to higher level back pain, but has tools to maintain function and manage this pain.    Personal Factors and Comorbidities Time since onset of injury/illness/exacerbation;Comorbidity 1;Comorbidity 2;Comorbidity 3+;Finances    Comorbidities see medical Hx    Examination-Activity Limitations Lift;Squat;Stand;Locomotion Level;Transfers    Clinical Decision Making Moderate    Rehab Potential Good    PT Frequency 1x / week    PT Duration 4 weeks    PT Treatment/Interventions ADLs/Self Care Home Management;Aquatic Therapy;Electrical Stimulation;Iontophoresis 64m/ml Dexamethasone;Moist Heat;Therapeutic exercise;Therapeutic activities;Functional mobility training;Stair training;Gait training;Patient/family education;Manual techniques;Spinal Manipulations;Taping;Dry needling;Balance training;Neuromuscular re-education    PT Home Exercise Plan [id number]    Consulted and Agree with Plan of Care Patient             Patient will benefit from skilled therapeutic intervention in order to improve the following deficits and impairments:  Decreased range of motion, Difficulty walking, Decreased activity tolerance, Pain, Decreased mobility, Decreased strength  Visit Diagnosis: Decreased ROM of lumbar spine  Chronic bilateral low back pain with left-sided sciatica  Difficulty in walking, not elsewhere classified  Spondylolisthesis, unspecified spinal region     Problem List Patient Active Problem List   Diagnosis Date Noted   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 12/03/2019   Vitamin B 12 deficiency 12/09/2017   Chronic post-traumatic stress disorder (PTSD) after military combat 06/03/2017   Major  depression, recurrent, chronic ([place] 01/01/2017   Chronic pain syndrome 09/03/2016   Insomnia 07/15/2015   Cervical spondylosis without myelopathy 02/06/2015   Obesity (BMI 30-39.9) 03/15/2013   BPH (benign prostatic hyperplasia) 11/15/2012   Type 2 diabetes mellitus with diabetic polyneuropathy, without long-term current use of insulin ([place] 12/22/2010   Chronic pain of both knees 12/08/2008   Hyperlipidemia associated with type 2 diabetes mellitus ([place], on statin 08/23/2006   Gout 08/23/2006   Obstructive sleep apnea 08/23/2006   Allergic rhinitis 08/23/2006   Chronic low back pain 08/23/2006   Seizure disorder ([place] 08/23/2006   History of CVA (cerebrovascular accident) 08/23/2006    PHYSICAL THERAPY DISCHARGE SUMMARY  Visits from Start of Care: 31  Current functional level related to goals / functional outcomes: See above   Remaining deficits: See above   Education / Equipment: HEP   Patient agrees to discharge. Patient goals were partially met. Patient is being discharged due to  limited progress.  AGar PontoMS, PT 03/20/21 3:26 PM   [facility]GBurlington [place] [address]Phone: [phone]  Fax:  [phone] Name: Mark KUHLMANMRN: [id number] of Birth: 501/25/1972

## 2021-05-27 ENCOUNTER — Other Ambulatory Visit: Payer: Self-pay | Admitting: Neurosurgery

## 2021-05-27 ENCOUNTER — Other Ambulatory Visit (HOSPITAL_COMMUNITY): Payer: Self-pay | Admitting: Neurosurgery

## 2021-05-27 DIAGNOSIS — M4310 Spondylolisthesis, site unspecified: Secondary | ICD-10-CM

## 2021-06-03 ENCOUNTER — Ambulatory Visit (HOSPITAL_COMMUNITY)
Admission: RE | Admit: 2021-06-03 | Discharge: 2021-06-03 | Disposition: A | Discharge status: Home / Self Care | Payer: No Typology Code available for payment source | Source: Ambulatory Visit | Attending: Neurosurgery | Admitting: Neurosurgery

## 2021-06-03 DIAGNOSIS — M4310 Spondylolisthesis, site unspecified: Secondary | ICD-10-CM | POA: Insufficient documentation

## 2021-09-24 ENCOUNTER — Other Ambulatory Visit: Payer: Self-pay | Admitting: Neurosurgery

## 2021-09-24 ENCOUNTER — Encounter: Payer: Self-pay | Admitting: Family Medicine

## 2021-09-24 DIAGNOSIS — M47816 Spondylosis without myelopathy or radiculopathy, lumbar region: Secondary | ICD-10-CM

## 2021-10-09 ENCOUNTER — Ambulatory Visit
Admission: RE | Admit: 2021-10-09 | Discharge: 2021-10-09 | Disposition: A | Discharge status: Home / Self Care | Payer: No Typology Code available for payment source | Source: Ambulatory Visit | Attending: Neurosurgery | Admitting: Neurosurgery

## 2021-10-09 DIAGNOSIS — M47816 Spondylosis without myelopathy or radiculopathy, lumbar region: Secondary | ICD-10-CM

## 2021-10-21 ENCOUNTER — Other Ambulatory Visit: Payer: Self-pay | Admitting: Neurosurgery

## 2021-10-25 ENCOUNTER — Encounter: Payer: Self-pay | Admitting: *Deleted

## 2021-12-17 ENCOUNTER — Telehealth: Payer: Self-pay | Admitting: *Deleted

## 2021-12-17 NOTE — Patient Outreach (Signed)
  Care Coordination   12/17/2021 Name: [name] MRN: [id number] DOB: Mar 19, 1970   Care Coordination Outreach Attempts:  An unsuccessful telephone outreach was attempted today to offer the patient information about available care coordination services as a benefit of their health plan.   Follow Up Plan:  Additional outreach attempts will be made to offer the patient care coordination information and services.   Encounter Outcome:  No Answer  Care Coordination Interventions Activated:  No   Care Coordination Interventions:  No, not indicated    Elliot Cousin, RN Care Management Coordinator [employer] Main Office [phone]

## 2021-12-31 NOTE — Pre-Procedure Instructions (Signed)
Surgical Instructions    Your procedure is scheduled on Monday, January 10, 2022 at 9:30 AM.  Report to [facility] Main Entrance "A" at 7:30 A.M., then check in with the Admitting office.  Call this number if you have problems the morning of surgery:  [phone]   If you have any questions prior to your surgery date call [phone]: Open Monday-Friday 8am-4pm  *If you experience any cold or flu symptoms such as cough, fever, chills, shortness of breath, etc. between now and your scheduled surgery, please notify us.*    Remember:  Do not eat or drink after midnight the night before your surgery    Take these medicines the morning of surgery with A SIP OF WATER:  gabapentin (NEURONTIN)  rosuvastatin (CRESTOR)  venlafaxine XR (EFFEXOR-XR)   IF NEEDED: acetaminophen (TYLENOL)  cetirizine (ZYRTEC)  cyclobenzaprine (FLEXERIL)  fluticasone (FLONASE)   As of today, STOP taking any Aspirin (unless otherwise instructed by your surgeon) Aleve, Naproxen, Ibuprofen, Motrin, Advil, Goody's, BC's, all herbal medications, fish oil, and all vitamins. This includes your diclofenac Sodium (VOLTAREN) 1 % GEL .                     Do NOT Smoke (Tobacco/Vaping) for 24 hours prior to your procedure.  If you use a CPAP at night, you may bring your mask/headgear for your overnight stay.   Contacts, glasses, piercing's, hearing aid's, dentures or partials may not be worn into surgery, please bring cases for these belongings.    For patients admitted to the hospital, discharge time will be determined by your treatment team.   Patients discharged the day of surgery will not be allowed to drive home, and someone needs to stay with them for 24 hours.  SURGICAL WAITING ROOM VISITATION Patients having surgery or a procedure may have two support people in the waiting area. Visitors may stay in the waiting area during the procedure and switch out with other visitors if needed. Children under the age  of 22 must have an adult accompany them who is not the patient. If the patient needs to stay at the hospital during part of their recovery, the visitor guidelines for inpatient rooms apply.  Please refer to the [facility] website for the visitor guidelines for Inpatients (after your surgery is over and you are in a regular room).    Special instructions:   [facility]- Preparing For Surgery  Before surgery, you can play an important role. Because skin is not sterile, your skin needs to be as free of germs as possible. You can reduce the number of germs on your skin by washing with CHG (chlorahexidine gluconate) Soap before surgery.  CHG is an antiseptic cleaner which kills germs and bonds with the skin to continue killing germs even after washing.    Oral Hygiene is also important to reduce your risk of infection.  Remember - BRUSH YOUR TEETH THE MORNING OF SURGERY WITH YOUR REGULAR TOOTHPASTE  Please do not use if you have an allergy to CHG or antibacterial soaps. If your skin becomes reddened/irritated stop using the CHG.  Do not shave (including legs and underarms) for at least 48 hours prior to first CHG shower. It is OK to shave your face.  Please follow these instructions carefully.   Shower the NIGHT BEFORE SURGERY and the MORNING OF SURGERY  If you chose to wash your hair, wash your hair first as usual with your normal shampoo.  After you shampoo,  rinse your hair and body thoroughly to remove the shampoo.  Use CHG Soap as you would any other liquid soap. You can apply CHG directly to the skin and wash gently with a scrungie or a clean washcloth.   Apply the CHG Soap to your body ONLY FROM THE NECK DOWN.  Do not use on open wounds or open sores. Avoid contact with your eyes, ears, mouth and genitals (private parts). Wash Face and genitals (private parts)  with your normal soap.   Wash thoroughly, paying special attention to the area where your surgery will be  performed.  Thoroughly rinse your body with warm water from the neck down.  DO NOT shower/wash with your normal soap after using and rinsing off the CHG Soap.  Pat yourself dry with a CLEAN TOWEL.  Wear CLEAN PAJAMAS to bed the night before surgery  Place CLEAN SHEETS on your bed the night before your surgery  DO NOT SLEEP WITH PETS.   Day of Surgery: Take a shower with CHG soap. Do not wear jewelry. Do not wear lotions, powders, colognes, or deodorant. Do not shave 48 hours prior to surgery.  Men may shave face and neck. Wear Clean/Comfortable clothing the morning of surgery Do not bring valuables to the hospital.  [facility] is not responsible for any belongings or valuables. Remember to brush your teeth WITH YOUR REGULAR TOOTHPASTE.   Please read over the following fact sheets that you were given.  If you received a COVID test during your pre-op visit  it is requested that you wear a mask when out in public, stay away from anyone that may not be feeling well and notify your surgeon if you develop symptoms. If you have been in contact with anyone that has tested positive in the last 10 days please notify you surgeon.

## 2022-01-03 ENCOUNTER — Other Ambulatory Visit: Payer: Self-pay

## 2022-01-03 ENCOUNTER — Encounter (HOSPITAL_COMMUNITY): Payer: Self-pay

## 2022-01-03 ENCOUNTER — Encounter (HOSPITAL_COMMUNITY)
Admission: RE | Admit: 2022-01-03 | Discharge: 2022-01-03 | Disposition: A | Discharge status: Home / Self Care | Payer: No Typology Code available for payment source | Source: Ambulatory Visit | Attending: Neurosurgery | Admitting: Neurosurgery

## 2022-01-03 VITALS — BP 122/82 | HR 84 | Temp 98.1°F | Resp 18 | Ht 70.0 in | Wt 221.2 lb

## 2022-01-03 DIAGNOSIS — Z01818 Encounter for other preprocedural examination: Secondary | ICD-10-CM | POA: Diagnosis present

## 2022-01-03 DIAGNOSIS — E1142 Type 2 diabetes mellitus with diabetic polyneuropathy: Secondary | ICD-10-CM | POA: Diagnosis not present

## 2022-01-03 LAB — TYPE AND SCREEN
ABO/RH(D): O POS
Antibody Screen: NEGATIVE

## 2022-01-03 LAB — CBC
HCT: 43.7 % (ref 39.0–52.0)
Hemoglobin: 14.3 g/dL (ref 13.0–17.0)
MCH: 25.9 pg — ABNORMAL LOW (ref 26.0–34.0)
MCHC: 32.7 g/dL (ref 30.0–36.0)
MCV: 79.2 fL — ABNORMAL LOW (ref 80.0–100.0)
Platelets: 223 10*3/uL (ref 150–400)
RBC: 5.52 MIL/uL (ref 4.22–5.81)
RDW: 16.5 % — ABNORMAL HIGH (ref 11.5–15.5)
WBC: 4.9 10*3/uL (ref 4.0–10.5)
nRBC: 0 % (ref 0.0–0.2)

## 2022-01-03 LAB — BASIC METABOLIC PANEL
Anion gap: 8 (ref 5–15)
BUN: 11 mg/dL (ref 6–20)
CO2: 27 mmol/L (ref 22–32)
Calcium: 9.6 mg/dL (ref 8.9–10.3)
Chloride: 107 mmol/L (ref 98–111)
Creatinine, Ser: 0.98 mg/dL (ref 0.61–1.24)
GFR, Estimated: >60 mL/min (ref >60)
Glucose, Bld: 106 mg/dL — ABNORMAL HIGH (ref 70–99)
Potassium: 4 mmol/L (ref 3.5–5.1)
Sodium: 142 mmol/L (ref 135–145)

## 2022-01-03 LAB — SURGICAL PCR SCREEN
MRSA, PCR: NEGATIVE
Staphylococcus aureus: NEGATIVE

## 2022-01-03 NOTE — Progress Notes (Signed)
PCP - Dr. Asencion Partridge Cardiologist - Denies  PPM/ICD - Denies  Chest x-ray - N/A EKG - 01/03/22 Stress Test - 03/10/15 ECHO - Denies Cardiac Cath - Denies  Sleep Study - OSA CPAP - Yes  Diabetes: Denies  Blood Thinner Instructions: N/A Aspirin Instructions: N/A  ERAS Protcol - No  COVID TEST- N/A   Anesthesia review: No  Patient denies shortness of breath, fever, cough and chest pain at PAT appointment   All instructions explained to the patient, with a verbal understanding of the material. Patient agrees to go over the instructions while at home for a better understanding. Patient also instructed to self quarantine after being tested for COVID-19. The opportunity to ask questions was provided.

## 2022-01-09 NOTE — Anesthesia Preprocedure Evaluation (Signed)
Anesthesia Evaluation  Patient identified by MRN, date of birth, ID band Patient awake    Reviewed: Allergy & Precautions, NPO status , Patient's Chart, lab work & pertinent test results  History of Anesthesia Complications (+) history of anesthetic complications  Airway Mallampati: II  TM Distance: >3 FB Neck ROM: Full    Dental  (+) Dental Advisory Given, Teeth Intact   Pulmonary sleep apnea , former smoker   Pulmonary exam normal breath sounds clear to auscultation       Cardiovascular Normal cardiovascular exam Rhythm:Regular Rate:Normal     Neuro/Psych Seizures -,  PSYCHIATRIC DISORDERS Anxiety Depression     Neuromuscular disease CVA    GI/Hepatic ,GERD  ,,  Endo/Other  diabetes    Renal/GU      Musculoskeletal  (+) Arthritis ,    Abdominal  (+) + obese  Peds  Hematology   Anesthesia Other Findings   Reproductive/Obstetrics                             Anesthesia Physical Anesthesia Plan  ASA: 3  Anesthesia Plan: General   Post-op Pain Management: Tylenol PO (pre-op)*, Gabapentin PO (pre-op)* and Ketamine IV*   Induction: Intravenous  PONV Risk Score and Plan: 3 and Midazolam, Ondansetron, Dexamethasone, Treatment may vary due to age or medical condition and Propofol infusion  Airway Management Planned: Oral ETT  Additional Equipment:   Intra-op Plan:   Post-operative Plan: Extubation in OR  Informed Consent: I have reviewed the patients History and Physical, chart, labs and discussed the procedure including the risks, benefits and alternatives for the proposed anesthesia with the patient or authorized representative who has indicated his/her understanding and acceptance.     Dental advisory given  Plan Discussed with: CRNA  Anesthesia Plan Comments:         Anesthesia Quick Evaluation

## 2022-01-10 ENCOUNTER — Other Ambulatory Visit: Payer: Self-pay

## 2022-01-10 ENCOUNTER — Encounter (HOSPITAL_COMMUNITY): Payer: Self-pay | Admitting: Neurosurgery

## 2022-01-10 ENCOUNTER — Observation Stay (HOSPITAL_COMMUNITY)
Admission: RE | Admit: 2022-01-10 | Discharge: 2022-01-11 | Disposition: A | Discharge status: Home / Self Care | Payer: No Typology Code available for payment source | Attending: Neurosurgery | Admitting: Neurosurgery

## 2022-01-10 ENCOUNTER — Ambulatory Visit (HOSPITAL_COMMUNITY): Payer: No Typology Code available for payment source

## 2022-01-10 ENCOUNTER — Ambulatory Visit (HOSPITAL_COMMUNITY): Payer: No Typology Code available for payment source | Admitting: Anesthesiology

## 2022-01-10 ENCOUNTER — Ambulatory Visit (HOSPITAL_BASED_OUTPATIENT_CLINIC_OR_DEPARTMENT_OTHER): Payer: No Typology Code available for payment source | Admitting: Anesthesiology

## 2022-01-10 ENCOUNTER — Encounter (HOSPITAL_COMMUNITY)
Admission: RE | Disposition: A | Discharge status: Home / Self Care | Payer: Self-pay | Source: Home / Self Care | Attending: Neurosurgery

## 2022-01-10 DIAGNOSIS — Z87891 Personal history of nicotine dependence: Secondary | ICD-10-CM

## 2022-01-10 DIAGNOSIS — G4730 Sleep apnea, unspecified: Secondary | ICD-10-CM

## 2022-01-10 DIAGNOSIS — Z8673 Personal history of transient ischemic attack (TIA), and cerebral infarction without residual deficits: Secondary | ICD-10-CM | POA: Insufficient documentation

## 2022-01-10 DIAGNOSIS — M48061 Spinal stenosis, lumbar region without neurogenic claudication: Secondary | ICD-10-CM | POA: Diagnosis not present

## 2022-01-10 DIAGNOSIS — F418 Other specified anxiety disorders: Secondary | ICD-10-CM

## 2022-01-10 DIAGNOSIS — M4316 Spondylolisthesis, lumbar region: Principal | ICD-10-CM | POA: Insufficient documentation

## 2022-01-10 DIAGNOSIS — E119 Type 2 diabetes mellitus without complications: Secondary | ICD-10-CM

## 2022-01-10 DIAGNOSIS — M4310 Spondylolisthesis, site unspecified: Secondary | ICD-10-CM | POA: Diagnosis present

## 2022-01-10 SURGERY — POSTERIOR LUMBAR FUSION 1 LEVEL
Anesthesia: General | Site: Back

## 2022-01-10 MED ORDER — ORAL CARE MOUTH RINSE: 15.0000 mL | Freq: Once | OROMUCOSAL | Status: DC

## 2022-01-10 MED ORDER — MIDAZOLAM HCL 2 MG/2ML IJ SOLN
INTRAMUSCULAR | Status: DC | PRN
  Administered 2022-01-10: 2 mg via INTRAVENOUS

## 2022-01-10 MED ORDER — PROMETHAZINE HCL 25 MG/ML IJ SOLN: 6.2500 mg | INTRAMUSCULAR | Status: DC | PRN

## 2022-01-10 MED ORDER — SUGAMMADEX SODIUM 200 MG/2ML IV SOLN
INTRAVENOUS | Status: DC | PRN
  Administered 2022-01-10: 200 mg via INTRAVENOUS

## 2022-01-10 MED ORDER — BISACODYL 10 MG RE SUPP: 10.0000 mg | Freq: Every day | RECTAL | Status: DC | PRN

## 2022-01-10 MED ORDER — GABAPENTIN 300 MG PO CAPS
300.0000 mg | ORAL_CAPSULE | Freq: Three times a day (TID) | ORAL | Status: DC
  Administered 2022-01-10 – 2022-01-11 (×3): 300 mg via ORAL
  Filled 2022-01-10 (×3): qty 1

## 2022-01-10 MED ORDER — PROPOFOL 10 MG/ML IV BOLUS
INTRAVENOUS | Status: AC
  Filled 2022-01-10: qty 20

## 2022-01-10 MED ORDER — CEFAZOLIN SODIUM-DEXTROSE 2-4 GM/100ML-% IV SOLN
2.0000 g | INTRAVENOUS | Status: AC
  Administered 2022-01-10: 2 g via INTRAVENOUS

## 2022-01-10 MED ORDER — CHLORHEXIDINE GLUCONATE CLOTH 2 % EX PADS: 6.0000 | MEDICATED_PAD | Freq: Once | CUTANEOUS | Status: DC

## 2022-01-10 MED ORDER — ALBUMIN HUMAN 5 % IV SOLN: INTRAVENOUS | Status: DC | PRN

## 2022-01-10 MED ORDER — DEXAMETHASONE SODIUM PHOSPHATE 10 MG/ML IJ SOLN
INTRAMUSCULAR | Status: DC | PRN
  Administered 2022-01-10: 10 mg via INTRAVENOUS

## 2022-01-10 MED ORDER — MIDAZOLAM HCL 2 MG/2ML IJ SOLN: INTRAMUSCULAR | Status: DC | PRN

## 2022-01-10 MED ORDER — ACETAMINOPHEN 650 MG RE SUPP: 650.0000 mg | RECTAL | Status: DC | PRN

## 2022-01-10 MED ORDER — LORATADINE 10 MG PO TABS
10.0000 mg | ORAL_TABLET | Freq: Every day | ORAL | Status: DC
  Administered 2022-01-11: 10 mg via ORAL
  Filled 2022-01-10: qty 1

## 2022-01-10 MED ORDER — ROCURONIUM BROMIDE 10 MG/ML (PF) SYRINGE
PREFILLED_SYRINGE | INTRAVENOUS | Status: DC | PRN
  Administered 2022-01-10: 20 mg via INTRAVENOUS
  Administered 2022-01-10: 70 mg via INTRAVENOUS
  Administered 2022-01-10: 30 mg via INTRAVENOUS

## 2022-01-10 MED ORDER — DIAZEPAM 5 MG PO TABS
5.0000 mg | ORAL_TABLET | Freq: Four times a day (QID) | ORAL | Status: DC | PRN
  Administered 2022-01-10: 10 mg via ORAL
  Filled 2022-01-10: qty 2

## 2022-01-10 MED ORDER — CEFAZOLIN SODIUM-DEXTROSE 1-4 GM/50ML-% IV SOLN
1.0000 g | Freq: Three times a day (TID) | INTRAVENOUS | Status: AC
  Administered 2022-01-10 (×2): 1 g via INTRAVENOUS
  Filled 2022-01-10 (×2): qty 50

## 2022-01-10 MED ORDER — ONDANSETRON HCL 4 MG/2ML IJ SOLN
INTRAMUSCULAR | Status: DC | PRN
  Administered 2022-01-10: 4 mg via INTRAVENOUS

## 2022-01-10 MED ORDER — OYSTER SHELL CALCIUM/D3 500-5 MG-MCG PO TABS
1.0000 | ORAL_TABLET | Freq: Three times a day (TID) | ORAL | Status: DC
  Administered 2022-01-10 – 2022-01-11 (×2): 1 via ORAL
  Filled 2022-01-10 (×2): qty 1

## 2022-01-10 MED ORDER — PROPOFOL 1000 MG/100ML IV EMUL
INTRAVENOUS | Status: AC
  Filled 2022-01-10: qty 100

## 2022-01-10 MED ORDER — ONDANSETRON HCL 4 MG/2ML IJ SOLN
INTRAMUSCULAR | Status: AC
  Filled 2022-01-10: qty 2

## 2022-01-10 MED ORDER — ONDANSETRON HCL 4 MG PO TABS: 4.0000 mg | ORAL_TABLET | Freq: Four times a day (QID) | ORAL | Status: DC | PRN

## 2022-01-10 MED ORDER — MENTHOL 3 MG MT LOZG: 1.0000 | LOZENGE | OROMUCOSAL | Status: DC | PRN

## 2022-01-10 MED ORDER — LIDOCAINE 2% (20 MG/ML) 5 ML SYRINGE
INTRAMUSCULAR | Status: AC
  Filled 2022-01-10: qty 5

## 2022-01-10 MED ORDER — ONDANSETRON HCL 4 MG/2ML IJ SOLN: 4.0000 mg | Freq: Four times a day (QID) | INTRAMUSCULAR | Status: DC | PRN

## 2022-01-10 MED ORDER — HYDROMORPHONE HCL 1 MG/ML IJ SOLN
INTRAMUSCULAR | Status: AC
  Filled 2022-01-10: qty 1

## 2022-01-10 MED ORDER — CHLORHEXIDINE GLUCONATE 0.12 % MT SOLN
OROMUCOSAL | Status: AC
  Administered 2022-01-10: 15 mL via OROMUCOSAL
  Filled 2022-01-10: qty 15

## 2022-01-10 MED ORDER — MIDAZOLAM HCL 2 MG/2ML IJ SOLN
INTRAMUSCULAR | Status: AC
  Filled 2022-01-10: qty 2

## 2022-01-10 MED ORDER — VANCOMYCIN HCL 1000 MG IV SOLR
INTRAVENOUS | Status: AC
  Filled 2022-01-10: qty 20

## 2022-01-10 MED ORDER — POLYETHYLENE GLYCOL 3350 17 G PO PACK: 17.0000 g | PACK | Freq: Every day | ORAL | Status: DC | PRN

## 2022-01-10 MED ORDER — TRAZODONE HCL 100 MG PO TABS: 200.0000 mg | ORAL_TABLET | Freq: Every evening | ORAL | Status: DC | PRN

## 2022-01-10 MED ORDER — THROMBIN 20000 UNITS EX SOLR
CUTANEOUS | Status: AC
  Filled 2022-01-10: qty 20000

## 2022-01-10 MED ORDER — CHLORHEXIDINE GLUCONATE 0.12 % MT SOLN: 15.0000 mL | Freq: Once | OROMUCOSAL | Status: AC

## 2022-01-10 MED ORDER — GABAPENTIN 300 MG PO CAPS: 300.0000 mg | ORAL_CAPSULE | Freq: Once | ORAL | Status: AC

## 2022-01-10 MED ORDER — GABAPENTIN 300 MG PO CAPS
ORAL_CAPSULE | ORAL | Status: AC
  Administered 2022-01-10: 300 mg via ORAL
  Filled 2022-01-10: qty 1

## 2022-01-10 MED ORDER — PHENYLEPHRINE HCL-NACL 20-0.9 MG/250ML-% IV SOLN
INTRAVENOUS | Status: DC | PRN
  Administered 2022-01-10: 15 ug/min via INTRAVENOUS

## 2022-01-10 MED ORDER — SODIUM CHLORIDE 0.9 % IV SOLN
250.0000 mL | INTRAVENOUS | Status: DC
  Administered 2022-01-10: 250 mL via INTRAVENOUS

## 2022-01-10 MED ORDER — SODIUM CHLORIDE 0.9 % IV SOLN: INTRAVENOUS | Status: DC | PRN

## 2022-01-10 MED ORDER — ROCURONIUM BROMIDE 10 MG/ML (PF) SYRINGE
PREFILLED_SYRINGE | INTRAVENOUS | Status: AC
  Filled 2022-01-10: qty 10

## 2022-01-10 MED ORDER — 0.9 % SODIUM CHLORIDE (POUR BTL) OPTIME
TOPICAL | Status: DC | PRN
  Administered 2022-01-10: 1000 mL

## 2022-01-10 MED ORDER — FENTANYL CITRATE (PF) 250 MCG/5ML IJ SOLN
INTRAMUSCULAR | Status: AC
  Filled 2022-01-10: qty 5

## 2022-01-10 MED ORDER — ACETAMINOPHEN 500 MG PO TABS
ORAL_TABLET | ORAL | Status: AC
  Administered 2022-01-10: 1000 mg via ORAL
  Filled 2022-01-10: qty 2

## 2022-01-10 MED ORDER — LACTATED RINGERS IV SOLN: INTRAVENOUS | Status: DC

## 2022-01-10 MED ORDER — CEFAZOLIN SODIUM-DEXTROSE 2-4 GM/100ML-% IV SOLN
INTRAVENOUS | Status: AC
  Filled 2022-01-10: qty 100

## 2022-01-10 MED ORDER — PROPOFOL 500 MG/50ML IV EMUL
INTRAVENOUS | Status: DC | PRN
  Administered 2022-01-10: 25 ug/kg/min via INTRAVENOUS

## 2022-01-10 MED ORDER — PHENYLEPHRINE 80 MCG/ML (10ML) SYRINGE FOR IV PUSH (FOR BLOOD PRESSURE SUPPORT)
PREFILLED_SYRINGE | INTRAVENOUS | Status: DC | PRN
  Administered 2022-01-10 (×5): 80 ug via INTRAVENOUS

## 2022-01-10 MED ORDER — ACETAMINOPHEN 325 MG PO TABS
650.0000 mg | ORAL_TABLET | ORAL | Status: DC | PRN
  Administered 2022-01-10 – 2022-01-11 (×3): 650 mg via ORAL
  Filled 2022-01-10 (×3): qty 2

## 2022-01-10 MED ORDER — LIDOCAINE 2% (20 MG/ML) 5 ML SYRINGE
INTRAMUSCULAR | Status: DC | PRN
  Administered 2022-01-10: 100 mg via INTRAVENOUS

## 2022-01-10 MED ORDER — DEXAMETHASONE SODIUM PHOSPHATE 10 MG/ML IJ SOLN
INTRAMUSCULAR | Status: AC
  Filled 2022-01-10: qty 1

## 2022-01-10 MED ORDER — BUPIVACAINE HCL (PF) 0.25 % IJ SOLN
INTRAMUSCULAR | Status: AC
  Filled 2022-01-10: qty 30

## 2022-01-10 MED ORDER — HYDROMORPHONE HCL 1 MG/ML IJ SOLN
0.2500 mg | INTRAMUSCULAR | Status: DC | PRN
  Administered 2022-01-10 (×4): 0.5 mg via INTRAVENOUS

## 2022-01-10 MED ORDER — ROSUVASTATIN CALCIUM 20 MG PO TABS: 20.0000 mg | ORAL_TABLET | Freq: Every day | ORAL | Status: DC

## 2022-01-10 MED ORDER — FENTANYL CITRATE (PF) 250 MCG/5ML IJ SOLN
INTRAMUSCULAR | Status: DC | PRN
  Administered 2022-01-10: 50 ug via INTRAVENOUS
  Administered 2022-01-10: 150 ug via INTRAVENOUS
  Administered 2022-01-10: 50 ug via INTRAVENOUS

## 2022-01-10 MED ORDER — MEPERIDINE HCL 25 MG/ML IJ SOLN: 6.2500 mg | INTRAMUSCULAR | Status: DC | PRN

## 2022-01-10 MED ORDER — THROMBIN 20000 UNITS EX SOLR: CUTANEOUS | Status: DC | PRN

## 2022-01-10 MED ORDER — ACETAMINOPHEN 500 MG PO TABS: 1000.0000 mg | ORAL_TABLET | Freq: Once | ORAL | Status: AC

## 2022-01-10 MED ORDER — PROPOFOL 10 MG/ML IV BOLUS
INTRAVENOUS | Status: DC | PRN
  Administered 2022-01-10: 150 mg via INTRAVENOUS

## 2022-01-10 MED ORDER — BUPIVACAINE HCL (PF) 0.25 % IJ SOLN
INTRAMUSCULAR | Status: DC | PRN
  Administered 2022-01-10: 20 mL

## 2022-01-10 MED ORDER — KETAMINE HCL 10 MG/ML IJ SOLN
INTRAMUSCULAR | Status: DC | PRN
  Administered 2022-01-10: 50 mg via INTRAVENOUS

## 2022-01-10 MED ORDER — ORAL CARE MOUTH RINSE: 15.0000 mL | Freq: Once | OROMUCOSAL | Status: AC

## 2022-01-10 MED ORDER — SODIUM CHLORIDE 0.9% FLUSH
3.0000 mL | Freq: Two times a day (BID) | INTRAVENOUS | Status: DC
  Administered 2022-01-10: 3 mL via INTRAVENOUS

## 2022-01-10 MED ORDER — FLUTICASONE PROPIONATE 50 MCG/ACT NA SUSP: 1.0000 | Freq: Every day | NASAL | Status: DC | PRN

## 2022-01-10 MED ORDER — HYDROMORPHONE HCL 1 MG/ML IJ SOLN: 1.0000 mg | INTRAMUSCULAR | Status: DC | PRN

## 2022-01-10 MED ORDER — FLEET ENEMA 7-19 GM/118ML RE ENEM: 1.0000 | ENEMA | Freq: Once | RECTAL | Status: DC | PRN

## 2022-01-10 MED ORDER — CHLORHEXIDINE GLUCONATE 0.12 % MT SOLN: 15.0000 mL | Freq: Once | OROMUCOSAL | Status: DC

## 2022-01-10 MED ORDER — VENLAFAXINE HCL ER 150 MG PO CP24
150.0000 mg | ORAL_CAPSULE | Freq: Two times a day (BID) | ORAL | Status: DC
  Administered 2022-01-10 – 2022-01-11 (×2): 150 mg via ORAL
  Filled 2022-01-10 (×3): qty 1

## 2022-01-10 MED ORDER — HYDROMORPHONE HCL 1 MG/ML IJ SOLN
0.2500 mg | INTRAMUSCULAR | Status: DC | PRN
  Administered 2022-01-10 (×3): 0.5 mg via INTRAVENOUS

## 2022-01-10 MED ORDER — ROSUVASTATIN CALCIUM 20 MG PO TABS
40.0000 mg | ORAL_TABLET | ORAL | Status: DC
  Administered 2022-01-11: 40 mg via ORAL
  Filled 2022-01-10: qty 2

## 2022-01-10 MED ORDER — VANCOMYCIN HCL 1000 MG IV SOLR
INTRAVENOUS | Status: DC | PRN
  Administered 2022-01-10: 1000 mg via TOPICAL

## 2022-01-10 MED ORDER — OXYCODONE HCL 5 MG PO TABS
10.0000 mg | ORAL_TABLET | ORAL | Status: DC | PRN
  Administered 2022-01-10 – 2022-01-11 (×5): 10 mg via ORAL
  Filled 2022-01-10 (×5): qty 2

## 2022-01-10 MED ORDER — PHENOL 1.4 % MT LIQD: 1.0000 | OROMUCOSAL | Status: DC | PRN

## 2022-01-10 MED ORDER — HYDROCODONE-ACETAMINOPHEN 10-325 MG PO TABS: 1.0000 | ORAL_TABLET | ORAL | Status: DC | PRN

## 2022-01-10 MED ORDER — SODIUM CHLORIDE 0.9% FLUSH: 3.0000 mL | INTRAVENOUS | Status: DC | PRN

## 2022-01-10 MED ORDER — KETAMINE HCL 50 MG/5ML IJ SOSY
PREFILLED_SYRINGE | INTRAMUSCULAR | Status: AC
  Filled 2022-01-10: qty 5

## 2022-01-10 SURGICAL SUPPLY — 58 items
BAG COUNTER SPONGE SURGICOUNT (BAG) ×1 IMPLANT
BAG DECANTER FOR FLEXI CONT (MISCELLANEOUS) ×1 IMPLANT
BENZOIN TINCTURE PRP APPL 2/3 (GAUZE/BANDAGES/DRESSINGS) ×1 IMPLANT
BLADE BONE MILL MEDIUM (MISCELLANEOUS) ×1 IMPLANT
BLADE CLIPPER SURG (BLADE) IMPLANT
BUR CUTTER 7.0 ROUND (BURR) IMPLANT
BUR MATCHSTICK NEURO 3.0 LAGG (BURR) ×1 IMPLANT
CANISTER SUCT 3000ML PPV (MISCELLANEOUS) ×1 IMPLANT
CAP LCK SPNE (Orthopedic Implant) ×5 IMPLANT
CAP LOCK SPINE RADIUS (Orthopedic Implant) IMPLANT
CAP LOCKING (Orthopedic Implant) ×5 IMPLANT
CNTNR URN SCR LID CUP LEK RST (MISCELLANEOUS) ×1 IMPLANT
CONT SPEC 4OZ STRL OR WHT (MISCELLANEOUS) ×1
COVER BACK TABLE 60X90IN (DRAPES) ×1 IMPLANT
DERMABOND ADVANCED .7 DNX12 (GAUZE/BANDAGES/DRESSINGS) ×1 IMPLANT
DRAPE C-ARM 42X72 X-RAY (DRAPES) ×2 IMPLANT
DRAPE HALF SHEET 40X57 (DRAPES) IMPLANT
DRAPE LAPAROTOMY 100X72X124 (DRAPES) ×1 IMPLANT
DRAPE SURG 17X23 STRL (DRAPES) ×4 IMPLANT
DRSG OPSITE POSTOP 4X6 (GAUZE/BANDAGES/DRESSINGS) ×1 IMPLANT
DURAPREP 26ML APPLICATOR (WOUND CARE) ×1 IMPLANT
ELECT REM PT RETURN 9FT ADLT (ELECTROSURGICAL) ×1
ELECTRODE REM PT RTRN 9FT ADLT (ELECTROSURGICAL) ×1 IMPLANT
EVACUATOR 1/8 PVC DRAIN (DRAIN) IMPLANT
GAUZE 4X4 16PLY ~~LOC~~+RFID DBL (SPONGE) IMPLANT
GAUZE SPONGE 4X4 12PLY STRL (GAUZE/BANDAGES/DRESSINGS) IMPLANT
GLOVE BIO SURGEON STRL SZ 6.5 (GLOVE) ×1 IMPLANT
GLOVE BIOGEL PI IND STRL 6.5 (GLOVE) ×1 IMPLANT
GLOVE ECLIPSE 9.0 STRL (GLOVE) ×2 IMPLANT
GLOVE EXAM NITRILE XL STR (GLOVE) IMPLANT
GOWN STRL REUS W/ TWL LRG LVL3 (GOWN DISPOSABLE) IMPLANT
GOWN STRL REUS W/ TWL XL LVL3 (GOWN DISPOSABLE) ×2 IMPLANT
GOWN STRL REUS W/TWL 2XL LVL3 (GOWN DISPOSABLE) IMPLANT
GOWN STRL REUS W/TWL LRG LVL3 (GOWN DISPOSABLE)
GOWN STRL REUS W/TWL XL LVL3 (GOWN DISPOSABLE) ×2
KIT BASIN OR (CUSTOM PROCEDURE TRAY) ×1 IMPLANT
KIT TURNOVER KIT B (KITS) ×1 IMPLANT
MILL MEDIUM DISP (BLADE) ×1 IMPLANT
NEEDLE HYPO 22GX1.5 SAFETY (NEEDLE) ×1 IMPLANT
NS IRRIG 1000ML POUR BTL (IV SOLUTION) ×1 IMPLANT
PACK LAMINECTOMY NEURO (CUSTOM PROCEDURE TRAY) ×1 IMPLANT
PUTTY GRAFTON DBF 6CC W/DELIVE (Putty) IMPLANT
ROD 5.5X60MM GREEN (Rod) IMPLANT
ROD 50MM (Rod) ×1 IMPLANT
ROD SPNL 50X5.5XNS TI RDS (Rod) IMPLANT
SCREW 5.75X45MM (Screw) IMPLANT
SPACER PL CATALYFT LONG 11 (Spacer) IMPLANT
SPIKE FLUID TRANSFER (MISCELLANEOUS) ×1 IMPLANT
SPONGE SURGIFOAM ABS GEL 100 (HEMOSTASIS) ×1 IMPLANT
STRIP CLOSURE SKIN 1/2X4 (GAUZE/BANDAGES/DRESSINGS) ×2 IMPLANT
SUT VIC AB 0 CT1 18XCR BRD8 (SUTURE) ×2 IMPLANT
SUT VIC AB 0 CT1 8-18 (SUTURE) ×2
SUT VIC AB 2-0 CT1 18 (SUTURE) ×1 IMPLANT
SUT VIC AB 3-0 SH 8-18 (SUTURE) ×2 IMPLANT
TOWEL GREEN STERILE (TOWEL DISPOSABLE) ×1 IMPLANT
TOWEL GREEN STERILE FF (TOWEL DISPOSABLE) ×1 IMPLANT
TRAY FOLEY MTR SLVR 16FR STAT (SET/KITS/TRAYS/PACK) ×1 IMPLANT
WATER STERILE IRR 1000ML POUR (IV SOLUTION) ×1 IMPLANT

## 2022-01-10 NOTE — Op Note (Signed)
Date of procedure: 01/10/2022  Date of dictation: Same  Service: Neurosurgery  Preoperative diagnosis: L4-5 dynamic degenerative spondylolisthesis with stenosis adjacent to prior L5-S1 decompression and fusion surgery  Postoperative diagnosis: Same  Procedure Name: Bilateral L4-5 decompressive laminotomies and foraminotomies, more than would be required for simple interbody fusion alone.  Bilateral L4-5 ponte osteotomies for sagittal plane restoration  L4-5 posterior lumbar interbody fusion utilizing interbody cages, local harvested autograft, and morselized allograft  L4-5 posterior hide arthrodesis utilizing nonsegmental pedicle screw fixation and local autografting  Surgeon:Tija Biss A.Zeb Rawl, M.D.  Asst. Surgeon: Marcello Moores, MD; Reinaldo Meeker, NP  Anesthesia: General  Indication: 51 year old male remotely status post L5-S1 decompression and fusion with increasing back and lower extremity pain left greater than right.  Workup demonstrates evidence of marked adjacent level facet arthropathy with dynamic instability at L4-5.  Patient has failed conservative management and presents now for decompression and fusion L4-5.  Operative note: After induction of anesthesia, patient positioned prone.  He is appropriately padded.  Lumbar region prepped and draped sterilely.  Incision made from L4 down to S1.  Dissection performed bilaterally.  Retractor placed.  Fluoroscopy used.  Levels confirmed.  Previously placed pedicle screw agitation at L5-S1 was dissected free, disassembled and the rods were removed.  The screw heads were both solidly fixed to the underlying bone and there is no evidence of pseudoarthrosis at the L5-S1 level.  Attention then placed to the L4-5 level.  Bilateral decompressive laminotomies and facetectomies were then performed using Leksell rongeurs, Kerrison rongeurs and high-speed drill to remove the inferior two thirds of the lamina of L4 the entire inferior facet and pars interarticularis  of L4 were removed bilaterally and the superior facet of L5 was resected bilaterally as was the superior aspect of the L5 lamina.  Ligament flavum elevated and resected.  Working laterally a complete lateral release completing ponte osteotomies was performed this allowed for mobilization of the interspace at L4-5 and restoration of his sagittal plane balance.  Disc base was then incised bilaterally.  Discectomy was then performed bilaterally.  The space is then prepared for interbody fusion.  With a 12 mm distractor placed the patient's right side disc base was further cleaned of soft tissue.  An 11 mm Medtronic expandable cage was then impacted in the place and expanded on the left side.  Distractor removed patient's right side.  The space prepared on the right side.  Soft tissue removed in the interspace.  Morselized autograft packed in the interspace.  Second cage was then impacted in place and expanded.  Pedicles of L4 were then identified using surface landmarks and intraoperative fluoroscopy.  Superficial bone overlying the pedicle was then removed using a high-speed drill.  Each pedicle was then probed using a pedicle all each pedicle tract was then probed and found to be solidly within the bone.  5.75 mm radius.  Screws from [employer] placed bilaterally at L4.  Final images revealed good position of the cages and the hardware at the proper operative level with normal alignment of the spine.  Wound was then irrigated 1 final time.  Each cage was then packed with demineralized bone fibers.  Gelfoam was placed over the laminotomy defects.  Transverse processes were decorticated bilaterally.  Morselized autograft was packed posterolaterally.  Short segment titanium rod was then placed to the screw heads at L4-5 and S1 on the right side.  Rod was placed to the screw heads at L4 and L5 on the left side.  Locking caps placed  over the screws.  Locking caps and engaged with the construct under compression.   Vancomycin powder was placed the deep wound space.  Wound was then closed in layers with Vicryl sutures.  Steri-Strips and sterile dressing were applied.  No apparent complications.  Patient tolerated the procedure well and he returns to the recovery room postop.

## 2022-01-10 NOTE — Brief Op Note (Signed)
01/10/2022  12:29 PM  PATIENT:  [name]  51 y.o. male  PRE-OPERATIVE DIAGNOSIS:  Spondylolisthesis  POST-OPERATIVE DIAGNOSIS:  Spondylolisthesis  PROCEDURE:  Procedure(s): PLIF - L4-L5 (N/A)  SURGEON:  Surgeon(s) and Role:    * Julio Sicks, MD - Primary    * Bedelia Person, MD - Assisting  PHYSICIAN ASSISTANT:   ASSISTANTSMarland Mcalpine   ANESTHESIA:   general  EBL:  450 mL   BLOOD ADMINISTERED:none  DRAINS: none   LOCAL MEDICATIONS USED:  MARCAINE     SPECIMEN:  No Specimen  DISPOSITION OF SPECIMEN:  N/A  COUNTS:  YES  TOURNIQUET:  * No tourniquets in log *  DICTATION: .Dragon Dictation  PLAN OF CARE: Admit for overnight observation  PATIENT DISPOSITION:  PACU - hemodynamically stable.   Delay start of Pharmacological VTE agent (>24hrs) due to surgical blood loss or risk of bleeding: yes

## 2022-01-10 NOTE — Anesthesia Procedure Notes (Signed)
Procedure Name: Intubation Date/Time: 01/10/2022 9:47 AM  Performed by: Nils Pyle, CRNAPre-anesthesia Checklist: Patient identified, Emergency Drugs available, Suction available and Patient being monitored Patient Re-evaluated:Patient Re-evaluated prior to induction Oxygen Delivery Method: Circle System Utilized Preoxygenation: Pre-oxygenation with 100% oxygen Induction Type: IV induction Ventilation: Mask ventilation without difficulty, Oral airway inserted - appropriate to patient size and Two handed mask ventilation required Laryngoscope Size: Miller and 2 Grade View: Grade I Tube type: Oral Tube size: 7.5 mm Number of attempts: 1 Airway Equipment and Method: Stylet and Oral airway Placement Confirmation: ETT inserted through vocal cords under direct vision, positive ETCO2 and breath sounds checked- equal and bilateral Secured at: 23 cm Tube secured with: Tape Dental Injury: Teeth and Oropharynx as per pre-operative assessment

## 2022-01-10 NOTE — Progress Notes (Signed)
Orthopedic Tech Progress Note Patient Details:  [name] 1971/01/23 [id number]  Went to apply LSO brace, pt already has an LSO brace   Patient ID: [name], male   DOB: [birth year], 51 y.o.   MRN: [id number]  [name] 01/10/2022, 3:40 PM

## 2022-01-10 NOTE — Progress Notes (Signed)
Placed patient on CPAP for the night.  

## 2022-01-10 NOTE — Transfer of Care (Signed)
Immediate Anesthesia Transfer of Care Note  Patient: [name]  Procedure(s) Performed: PLIF - L4-L5 (Back)  Patient Location: PACU  Anesthesia Type:General  Level of Consciousness: awake and drowsy  Airway & Oxygen Therapy: Patient Spontanous Breathing and Patient connected to face mask oxygen  Post-op Assessment: Report given to RN, Post -op Vital signs reviewed and stable, and Patient moving all extremities X 4  Post vital signs: Reviewed and stable  Last Vitals:  Vitals Value Taken Time  BP 112/70 01/10/22 1248  Temp    Pulse 88 01/10/22 1253  Resp 18 01/10/22 1252  SpO2 96 % 01/10/22 1253  Vitals shown include unvalidated device data.  Last Pain:  Vitals:   01/10/22 0747  TempSrc: Oral  PainSc: 5          Complications: No notable events documented.

## 2022-01-10 NOTE — H&P (Signed)
[name] is an 51 y.o. male.   Chief Complaint: Back pain HPI: 51 year old male status post prior L5-S1 decompression and fusion presents now with worsening back pain with some intermittent pain into his left lower extremity.  Patient has failed all efforts of conservative manage.  Workup demonstrates evidence of dynamic spondylolisthesis at L4-5 with significant lateral recess stenosis.  Fusion L5-S1 appears stable and solid.  Remainder of his lumbar spine appears healthy.  Past Medical History:  Diagnosis Date   Adrenal abnormality (HCC)    Allergy    Anxiety    Arthritis    Atypical chest pain    BPH (benign prostatic hypertrophy)    Complication of anesthesia    Hard time waking up after neck surgery   Depression    GERD (gastroesophageal reflux disease)    "not since Gastric Bypass"   Gout    H/O: CVA (cardiovascular accident)    Hyperlipidemia    Knee pain, bilateral    LBP (low back pain)    Neuromuscular disorder (HCC)    OSA (obstructive sleep apnea)    Plantar fasciitis    PTSD (post-traumatic stress disorder)    Seizure disorder ([place])    Sleep apnea    Stroke ([place]) 2001   some memory loss- math , science     Past Surgical History:  Procedure Laterality Date   CARPAL TUNNEL RELEASE Bilateral    COLONOSCOPY     ENDOSCOPIC PLANTAR FASCIOTOMY     ESOPHAGOGASTRODUODENOSCOPY     GASTRIC ROUX-EN-Y N/A 12/03/2019   Procedure: LAPAROSCOPIC ROUX-EN-Y GASTRIC BYPASS WITH UPPER ENDOSCOPY;  Surgeon: Johnathan Hausen, MD;  Location: [facility];  Service: General;  Laterality: N/A;   HEEL SPUR SURGERY Left    LUMBAR LAMINECTOMY/DECOMPRESSION MICRODISCECTOMY  2022   Dr. Annette Stable   NECK SURGERY     Titanium Plate in Vertebrae   SHOULDER SURGERY Right    TRIGGER FINGER RELEASE Right 05/14/2015   Procedure: RIGHT THUMB TRIGGER RELEASE ;  Surgeon: Leanora Cover, MD;  Location: [facility];  Service: Orthopedics;  Laterality: Right;   UPPER GASTROINTESTINAL  ENDOSCOPY      Family History  Problem Relation Age of Onset   Diabetes Father    Hypertension Father    Cancer Maternal Uncle    Heart attack Maternal Grandfather    Allergies Daughter    Colon cancer Neg Hx    Colon polyps Neg Hx    Esophageal cancer Neg Hx    Stomach cancer Neg Hx    Rectal cancer Neg Hx    Social History:  reports that he quit smoking about 31 years ago. His smoking use included cigarettes. He has never used smokeless tobacco. He reports that he does not drink alcohol and does not use drugs.  Allergies:  Allergies  Allergen Reactions   Atorvastatin Swelling   Pregabalin     Other reaction(s): Unknown   Tizanidine Hives   Cymbalta [Duloxetine Hcl]     Stomach upset, difficulty urinating, decreased libido.    Medications Prior to Admission  Medication Sig Dispense Refill   acetaminophen (TYLENOL) 500 MG tablet Take 1,000 mg by mouth every 6 (six) hours as needed for mild pain or moderate pain.      calcium citrate-vitamin D 500-400 MG-UNIT chewable tablet Chew 1 tablet by mouth 3 (three) times daily.     cetirizine (ZYRTEC) 10 MG tablet Take 10 mg by mouth daily as needed for allergies.      cyclobenzaprine (  FLEXERIL) 10 MG tablet Take 10 mg by mouth 3 (three) times daily as needed for muscle spasms.     diclofenac Sodium (VOLTAREN) 1 % GEL Apply 2 g topically daily as needed (pain).     fluticasone (FLONASE) 50 MCG/ACT nasal spray Place 1 spray into both nostrils daily. (Patient taking differently: Place 1 spray into both nostrils daily as needed for allergies.) 18.2 mL 5   gabapentin (NEURONTIN) 300 MG capsule Take 300 mg by mouth 3 (three) times daily.     Menthol-Methyl Salicylate (MUSCLE RUB) 10-15 % CREA Apply 1 Application topically as needed for muscle pain.     Multiple Vitamins-Minerals (MULTIVITAMIN WITH MINERALS) tablet Take 1 tablet by mouth daily.     NON FORMULARY Pt uses a cpap nightly     rosuvastatin (CRESTOR) 40 MG tablet Take 40 mg by  mouth every other day.     sildenafil (VIAGRA) 100 MG tablet Take 50 mg by mouth as needed for erectile dysfunction (Take one-half tablet by mouth as instructed (take 1 hour prior to sexual activity *do not exceed 1 dose per 24 hour period*)).     traZODone (DESYREL) 100 MG tablet Take 200 mg by mouth at bedtime as needed for sleep.     venlafaxine XR (EFFEXOR-XR) 150 MG 24 hr capsule Take 150 mg by mouth 2 (two) times daily.     diazepam (VALIUM) 5 MG tablet Take 1-2 tablets (5-10 mg total) by mouth every 6 (six) hours as needed for muscle spasms. (Patient not taking: Reported on 03/11/2021) 30 tablet 0   erythromycin ophthalmic ointment Place 1 application into the left eye 3 (three) times daily. (Patient not taking: Reported on 12/31/2021) 3.5 g 0   oxyCODONE 10 MG TABS Take 1 tablet (10 mg total) by mouth every 3 (three) hours as needed for severe pain ((score 7 to 10)). (Patient not taking: Reported on 12/31/2021) 50 tablet 0   rosuvastatin (CRESTOR) 20 MG tablet Take 1 tablet (20 mg total) by mouth daily. (Patient not taking: Reported on 12/31/2021) 90 tablet 2    No results found for this or any previous visit (from the past 48 hour(s)). No results found.  Pertinent items noted in HPI and remainder of comprehensive ROS otherwise negative.  Blood pressure 134/81, pulse 91, temperature 98.1 F (36.7 C), temperature source Oral, resp. rate 18, SpO2 99 %.  Patient is awake and alert.  He is oriented and appropriate.  Speech is fluent.  Judgment insight are intact.  Cranial nerve function normal bilateral.  Motor examination reveals intact motor strength bilateral.  Sensory examination decreased sensation pinprick and light touch in his left L5 and S1 dermatomes.  Deep tendon reflexes are normal active except his Achilles reflexes are absent bilaterally.  Gait is antalgic.  Posture is mildly flexed.  Examination head ears eyes nose and throat is unremarked.  Chest and abdomen are benign.  Extremities  are free from injury or deformity. Assessment/Plan L4-5 dynamic degenerative spondylolisthesis with stenosis and chronic back pain with radiculopathy.  Plan bilateral L4-5 decompressive laminotomies and foraminotomies followed by posterior lumbar interbody fusion utilizing interbody cages, AUTOGRAFT, AND AUGMENTED WITH POSTERIOR LOAD ARTHRODESIS UTILIZING NONSEGMENTAL PEDICLE SCREW FIXATION AND LOCAL AUTOGRAFTING.  RISKS AND BENEFITS BEEN EXPLAINED.  PATIENT WISHES TO PROCEED.  Sherilyn Cooter A Kamaree Berkel 01/10/2022, 9:27 AM

## 2022-01-11 DIAGNOSIS — M4316 Spondylolisthesis, lumbar region: Secondary | ICD-10-CM | POA: Diagnosis not present

## 2022-01-11 MED ORDER — METHOCARBAMOL 500 MG PO TABS: 500.0000 mg | ORAL_TABLET | Freq: Four times a day (QID) | ORAL | 0 refills | Status: AC

## 2022-01-11 MED ORDER — HYDROCODONE-ACETAMINOPHEN 10-325 MG PO TABS: 1.0000 | ORAL_TABLET | ORAL | 0 refills | Status: AC | PRN

## 2022-01-11 NOTE — Evaluation (Signed)
Physical Therapy Evaluation  Patient Details Name: [name] MRN: [id number] DOB: [birth year] Today's Date: 01/11/2022  History of Present Illness  Mr. [name] is a 51 yo male s/pBilateral L4-5 decompressive laminotomies and foraminotomies and fusion due to increasing back and lower extremity pain left greater than right.  Workup demonstrates evidence of marked adjacent level facet arthropathy with dynamic instability at L4-5.   Clinical Impression  Pt admitted with above diagnosis. At the time of PT eval, pt was able to demonstrate transfers and ambulation with gross supervision for safety to modified independence and RW for support. Pt was educated on precautions, brace application/wearing schedule, appropriate activity progression, and car transfer. Pt currently with functional limitations due to the deficits listed below (see PT Problem List). Pt will benefit from skilled PT to increase their independence and safety with mobility to allow discharge to the venue listed below.         Recommendations for follow up therapy are one component of a multi-disciplinary discharge planning process, led by the attending physician.  Recommendations may be updated based on patient status, additional functional criteria and insurance authorization.  Follow Up Recommendations No PT follow up      Assistance Recommended at Discharge PRN  Patient can return home with the following  A little help with walking and/or transfers;A little help with bathing/dressing/bathroom;Assistance with cooking/housework;Assist for transportation;Help with stairs or ramp for entrance    Equipment Recommendations None recommended by PT  Recommendations for Other Services       Functional Status Assessment Patient has had a recent decline in their functional status and demonstrates the ability to make significant improvements in function in a reasonable and predictable amount of time.     Precautions /  Restrictions Precautions Precautions: Back Precaution Booklet Issued: Yes (comment) Required Braces or Orthoses: Spinal Brace Spinal Brace: Lumbar corset;Applied in sitting position Restrictions Weight Bearing Restrictions: No      Mobility  Bed Mobility Overal bed mobility: Modified Independent             General bed mobility comments: Slow but able to complete with increased time. HOB flat and rails lowered to simulate home environment. VC's throughout for optimal log roll technique.    Transfers Overall transfer level: Modified independent Equipment used: Rolling walker (2 wheels)               General transfer comment: VC's for wide BOS and hand placement on seated surface for safety. Pt was able to power up to full stand without assistance.    Ambulation/Gait Ambulation/Gait assistance: Supervision Gait Distance (Feet): 200 Feet Assistive device: Rolling walker (2 wheels) Gait Pattern/deviations: Step-through pattern, Decreased stride length, Trunk flexed, Narrow base of support Gait velocity: Decreased Gait velocity interpretation: <1.31 ft/sec, indicative of household ambulator   General Gait Details: Short step/stride length and very narrow BOS. Pt reports increased pain with weight bearing through the L side during ambulation. Able to make corrective changes and improve gait pattern for short bouts but reverts back to narrow BOS and short steps for comfort.  Stairs            Wheelchair Mobility    Modified Rankin (Stroke Patients Only)       Balance Overall balance assessment: Mild deficits observed, not formally tested  Pertinent Vitals/Pain Pain Assessment Pain Assessment: Faces Faces Pain Scale: Hurts little more Pain Location: incisional Pain Descriptors / Indicators: Aching, Sore Pain Intervention(s): Limited activity within patient's tolerance, Monitored during session,  Repositioned    Home Living Family/patient expects to be discharged to:: Private residence Living Arrangements: Spouse/significant other Available Help at Discharge: Family;Available 24 hours/day Type of Home: Apartment Home Access: Level entry       Home Layout: One level Home Equipment: Conservation officer, nature (2 wheels);BSC/3in1;Shower seat      Prior Function Prior Level of Function : Independent/Modified Independent                     Hand Dominance   Dominant Hand: Right    Extremity/Trunk Assessment   Upper Extremity Assessment Upper Extremity Assessment: Defer to OT evaluation    Lower Extremity Assessment Lower Extremity Assessment: Generalized weakness (Mild, consistent with pre-op diagnosis)    Cervical / Trunk Assessment Cervical / Trunk Assessment: Back Surgery  Communication   Communication: No difficulties  Cognition Arousal/Alertness: Awake/alert Behavior During Therapy: WFL for tasks assessed/performed Overall Cognitive Status: Within Functional Limits for tasks assessed                                          General Comments      Exercises     Assessment/Plan    PT Assessment Patient needs continued PT services  PT Problem List Decreased strength;Decreased activity tolerance;Decreased range of motion;Decreased balance;Decreased mobility;Decreased knowledge of use of DME;Decreased safety awareness;Decreased knowledge of precautions;Pain       PT Treatment Interventions DME instruction;Gait training;Stair training;Functional mobility training;Therapeutic activities;Therapeutic exercise;Balance training;Patient/family education    PT Goals (Current goals can be found in the Care Plan section)  Acute Rehab PT Goals Patient Stated Goal: Home today PT Goal Formulation: With patient Time For Goal Achievement: 01/18/22 Potential to Achieve Goals: Good    Frequency Min 5X/week     Co-evaluation                AM-PAC PT "6 Clicks" Mobility  Outcome Measure Help needed turning from your back to your side while in a flat bed without using bedrails?: None Help needed moving from lying on your back to sitting on the side of a flat bed without using bedrails?: None Help needed moving to and from a bed to a chair (including a wheelchair)?: A Little Help needed standing up from a chair using your arms (e.g., wheelchair or bedside chair)?: A Little Help needed to walk in hospital room?: A Little Help needed climbing 3-5 steps with a railing? : A Little 6 Click Score: 20    End of Session Equipment Utilized During Treatment: Gait belt Activity Tolerance: Patient tolerated treatment well Patient left: in bed;with call bell/phone within reach;with family/visitor present Nurse Communication: Mobility status PT Visit Diagnosis: Unsteadiness on feet (R26.81);Pain Pain - part of body:  (back)    Time: [id number] PT Time Calculation (min) (ACUTE ONLY): 31 min   Charges:   PT Evaluation $PT Eval Low Complexity: 1 Low PT Treatments $Gait Training: 8-22 mins        Rolinda Roan, PT, DPT Acute Rehabilitation Services Secure Chat Preferred Office: [phone]   Thelma Comp 01/11/2022, 11:02 AM

## 2022-01-11 NOTE — Discharge Summary (Signed)
Physician Discharge Summary  Patient ID: [name] MRN: [id number] DOB/AGE: 08/18/1970 51 y.o.  Admit date: 01/10/2022 Discharge date: 01/11/2022  Admission Diagnoses:  Discharge Diagnoses:  Active Problems:   Degenerative spondylolisthesis   Discharged Condition: good  Hospital Course: Patient admitted to the hospital where he underwent uncomplicated L4-5 decompression and fusion surgery.  Postoperatively doing reasonably well.  Patient with the expected amount of back pain.  No radicular pain or weakness.  Standing ambulating and voiding without difficulty.  Ready for discharge home.  Consults:   Significant Diagnostic Studies:   Treatments:   Discharge Exam: Blood pressure 115/71, pulse 96, temperature 99.3 F (37.4 C), temperature source Oral, resp. rate 18, SpO2 97 %. Awake and alert.  Oriented and appropriate.  Motor and sensory function intact.  Wound clean and dry.  Chest and abdomen benign.  Disposition: Discharge disposition: 01-Home or Self Care        Allergies as of 01/11/2022       Reactions   Atorvastatin Swelling   Pregabalin    Other reaction(s): Unknown   Tizanidine Hives   Cymbalta [duloxetine Hcl]    Stomach upset, difficulty urinating, decreased libido.        Medication List     TAKE these medications    acetaminophen 500 MG tablet Commonly known as: TYLENOL Take 1,000 mg by mouth every 6 (six) hours as needed for mild pain or moderate pain.   calcium citrate-vitamin D 500-400 MG-UNIT chewable tablet Chew 1 tablet by mouth 3 (three) times daily.   cetirizine 10 MG tablet Commonly known as: ZYRTEC Take 10 mg by mouth daily as needed for allergies.   cyclobenzaprine 10 MG tablet Commonly known as: FLEXERIL Take 10 mg by mouth 3 (three) times daily as needed for muscle spasms.   diazepam 5 MG tablet Commonly known as: VALIUM Take 1-2 tablets (5-10 mg total) by mouth every 6 (six) hours as needed for muscle spasms.    diclofenac Sodium 1 % Gel Commonly known as: VOLTAREN Apply 2 g topically daily as needed (pain).   erythromycin ophthalmic ointment Place 1 application into the left eye 3 (three) times daily.   fluticasone 50 MCG/ACT nasal spray Commonly known as: FLONASE Place 1 spray into both nostrils daily. What changed:  when to take this reasons to take this   gabapentin 300 MG capsule Commonly known as: NEURONTIN Take 300 mg by mouth 3 (three) times daily.   HYDROcodone-acetaminophen 10-325 MG tablet Commonly known as: NORCO Take 1 tablet by mouth every 4 (four) hours as needed for moderate pain ((score 4 to 6)).   methocarbamol 500 MG tablet Commonly known as: ROBAXIN Take 1 tablet (500 mg total) by mouth 4 (four) times daily.   multivitamin with minerals tablet Take 1 tablet by mouth daily.   Muscle Rub 10-15 % Crea Apply 1 Application topically as needed for muscle pain.   NON FORMULARY Pt uses a cpap nightly   Oxycodone HCl 10 MG Tabs Take 1 tablet (10 mg total) by mouth every 3 (three) hours as needed for severe pain ((score 7 to 10)).   rosuvastatin 20 MG tablet Commonly known as: CRESTOR Take 1 tablet (20 mg total) by mouth daily.   rosuvastatin 40 MG tablet Commonly known as: CRESTOR Take 40 mg by mouth every other day.   sildenafil 100 MG tablet Commonly known as: VIAGRA Take 50 mg by mouth as needed for erectile dysfunction (Take one-half tablet by mouth as instructed (take 1  hour prior to sexual activity *do not exceed 1 dose per 24 hour period*)).   traZODone 100 MG tablet Commonly known as: DESYREL Take 200 mg by mouth at bedtime as needed for sleep.   venlafaxine XR 150 MG 24 hr capsule Commonly known as: EFFEXOR-XR Take 150 mg by mouth 2 (two) times daily.               Durable Medical Equipment  (From admission, onward)           Start     Ordered   01/10/22 1510  DME Walker rolling  Once       Question:  Patient needs a walker to  treat with the following condition  Answer:  Degenerative spondylolisthesis   01/10/22 1509   01/10/22 1510  DME 3 n 1  Once        01/10/22 1509             Signed: Sherilyn Cooter A Joyce Leckey 01/11/2022, 9:14 AM

## 2022-01-11 NOTE — Anesthesia Postprocedure Evaluation (Signed)
Anesthesia Post Note  Patient: [name]  Procedure(s) Performed: PLIF - L4-L5 (Back)     Patient location during evaluation: PACU Anesthesia Type: General Level of consciousness: sedated and patient cooperative Pain management: pain level controlled Vital Signs Assessment: post-procedure vital signs reviewed and stable Respiratory status: spontaneous breathing Cardiovascular status: stable Anesthetic complications: no   No notable events documented.  Last Vitals:  Vitals:   01/11/22 0402 01/11/22 0741  BP: 105/72 115/71  Pulse: 86 96  Resp: 18   Temp: 37.4 C   SpO2: 100% 97%    Last Pain:  Vitals:   01/11/22 0542  TempSrc:   PainSc: 10-Worst pain ever                 [name]

## 2022-01-11 NOTE — Plan of Care (Signed)
Patient alert and oriented, void, ambulate. D/c instruction explain and copy given, all questions answered. Will d/c patient home per order. Problem: Education: Goal: Ability to verbalize activity precautions or restrictions will improve Outcome: Completed/Met Goal: Knowledge of the prescribed therapeutic regimen will improve Outcome: Completed/Met Goal: Understanding of discharge needs will improve Outcome: Completed/Met

## 2022-01-11 NOTE — Evaluation (Signed)
Occupational Therapy Evaluation and Discharge Patient Details Name: [name] MRN: [id number] DOB: 23-Jun-1970 Today's Date: 01/11/2022   History of Present Illness [name] is a 51 yo male s/pBilateral L4-5 decompressive laminotomies and foraminotomies and fusion due to increasing back and lower extremity pain left greater than right.  Workup demonstrates evidence of marked adjacent level facet arthropathy with dynamic instability at L4-5.   Clinical Impression   This 51 yo male admitted and underwent above presents to acute OT with PLOF of being Mod I-Independent with basic ADLs. All education completed and pt will have prn A from wife and brother. No further acute OT or follow up up OT needs, we will sign off.      Recommendations for follow up therapy are one component of a multi-disciplinary discharge planning process, led by the attending physician.  Recommendations may be updated based on patient status, additional functional criteria and insurance authorization.   Follow Up Recommendations  No OT follow up     Assistance Recommended at Discharge PRN  Patient can return home with the following A little help with bathing/dressing/bathroom;Assistance with cooking/housework    Functional Status Assessment  Patient has had a recent decline in their functional status and demonstrates the ability to make significant improvements in function in a reasonable and predictable amount of time. (without further need for skilled OT)  Equipment Recommendations  None recommended by OT       Precautions / Restrictions Precautions Precautions: Back Precaution Booklet Issued: Yes (comment) Required Braces or Orthoses: Spinal Brace Spinal Brace: Lumbar corset;Applied in sitting position Restrictions Weight Bearing Restrictions: No      Mobility Bed Mobility               General bed mobility comments: Pt coming out of bathroom upon my entry into room     Transfers Overall transfer level: Modified independent                        Balance Overall balance assessment: Mild deficits observed, not formally tested                                         ADL either performed or assessed with clinical judgement   ADL                                         General ADL Comments: Pt recalls alot from last surgery in March of 2022. He is aware of using the 2 cups to brush his teeht, using wet wipes for back peri care, log roll for OOB. Did educate him on use of the reacher he has for LBD, sit<>stand stance that keeps back more straight.Also recommend wide sock aid, long handled sponge, and hand held shower head (handout given to patient)     Vision Patient Visual Report: No change from baseline              Pertinent Vitals/Pain Pain Assessment Pain Assessment: Faces Faces Pain Scale: Hurts little more Pain Location: incisional Pain Descriptors / Indicators: Aching, Sore Pain Intervention(s): Limited activity within patient's tolerance, Monitored during session, Repositioned     Hand Dominance Right   Extremity/Trunk Assessment Upper Extremity Assessment Upper Extremity Assessment: Overall WFL for tasks assessed  Communication Communication Communication: No difficulties   Cognition Arousal/Alertness: Awake/alert Behavior During Therapy: WFL for tasks assessed/performed Overall Cognitive Status: Within Functional Limits for tasks assessed                                                  Home Living Family/patient expects to be discharged to:: Private residence Living Arrangements: Spouse/significant other Available Help at Discharge: Family;Available 24 hours/day Type of Home: Apartment Home Access: Level entry     Home Layout: One level     Bathroom Shower/Tub: Chief Strategy Officer: Standard     Home Equipment: Clinical biochemist (2 wheels);BSC/3in1;Shower seat          Prior Functioning/Environment Prior Level of Function : Independent/Modified Independent                        OT Problem List: Decreased range of motion;Impaired balance (sitting and/or standing);Pain         OT Goals(Current goals can be found in the care plan section) Acute Rehab OT Goals Patient Stated Goal: home today         AM-PAC OT "6 Clicks" Daily Activity     Outcome Measure Help from another person eating meals?: None Help from another person taking care of personal grooming?: None Help from another person toileting, which includes using toliet, bedpan, or urinal?: None Help from another person bathing (including washing, rinsing, drying)?: A Little Help from another person to put on and taking off regular upper body clothing?: None Help from another person to put on and taking off regular lower body clothing?: A Lot 6 Click Score: 21   End of Session Equipment Utilized During Treatment: Rolling walker (2 wheels);Back brace Nurse Communication:  (no futher OT needs)  Activity Tolerance: Patient tolerated treatment well Patient left:  (sitting on EOB waiting on PT)  OT Visit Diagnosis: Other abnormalities of gait and mobility (R26.89);Pain Pain - part of body:  (incisional)                Time: 1856-3149 OT Time Calculation (min): 27 min Charges:  OT General Charges $OT Visit: 1 Visit OT Evaluation $OT Eval Moderate Complexity: 1 Mod OT Treatments $Self Care/Home Management : 8-22 mins  Ignacia Palma, OTR/L Acute Rehab Services [facility] [phone] Office [phone]    Evette Georges 01/11/2022, 9:56 AM

## 2022-01-18 MED FILL — Heparin Sodium (Porcine) Inj 1000 Unit/ML: INTRAMUSCULAR | Qty: 30 | Status: AC

## 2022-01-18 MED FILL — Sodium Chloride IV Soln 0.9%: INTRAVENOUS | Qty: 2000 | Status: AC

## 2022-05-18 LAB — HM DIABETES EYE EXAM

## 2022-06-13 ENCOUNTER — Encounter: Payer: No Typology Code available for payment source | Admitting: Family Medicine

## 2022-06-22 ENCOUNTER — Ambulatory Visit: Payer: No Typology Code available for payment source | Admitting: Family Medicine

## 2022-06-22 ENCOUNTER — Encounter: Payer: Self-pay | Admitting: Family Medicine

## 2022-06-22 VITALS — BP 120/82 | HR 84 | Temp 97.8°F | Ht 70.0 in | Wt 222.4 lb

## 2022-06-22 DIAGNOSIS — E785 Hyperlipidemia, unspecified: Secondary | ICD-10-CM

## 2022-06-22 DIAGNOSIS — E119 Type 2 diabetes mellitus without complications: Secondary | ICD-10-CM

## 2022-06-22 DIAGNOSIS — E1142 Type 2 diabetes mellitus with diabetic polyneuropathy: Secondary | ICD-10-CM

## 2022-06-22 DIAGNOSIS — G40909 Epilepsy, unspecified, not intractable, without status epilepticus: Secondary | ICD-10-CM

## 2022-06-22 DIAGNOSIS — E538 Deficiency of other specified B group vitamins: Secondary | ICD-10-CM

## 2022-06-22 DIAGNOSIS — Z8673 Personal history of transient ischemic attack (TIA), and cerebral infarction without residual deficits: Secondary | ICD-10-CM

## 2022-06-22 DIAGNOSIS — Z91018 Allergy to other foods: Secondary | ICD-10-CM

## 2022-06-22 DIAGNOSIS — F339 Major depressive disorder, recurrent, unspecified: Secondary | ICD-10-CM

## 2022-06-22 DIAGNOSIS — E1169 Type 2 diabetes mellitus with other specified complication: Secondary | ICD-10-CM

## 2022-06-22 DIAGNOSIS — G894 Chronic pain syndrome: Secondary | ICD-10-CM

## 2022-06-22 DIAGNOSIS — Z0000 Encounter for general adult medical examination without abnormal findings: Secondary | ICD-10-CM

## 2022-06-22 DIAGNOSIS — Z0001 Encounter for general adult medical examination with abnormal findings: Secondary | ICD-10-CM

## 2022-06-22 DIAGNOSIS — G4733 Obstructive sleep apnea (adult) (pediatric): Secondary | ICD-10-CM

## 2022-06-22 DIAGNOSIS — G8929 Other chronic pain: Secondary | ICD-10-CM

## 2022-06-22 LAB — CBC WITH DIFFERENTIAL/PLATELET
Basophils Absolute: 0 10*3/uL (ref 0.0–0.1)
Basophils Relative: 0.5 % (ref 0.0–3.0)
Eosinophils Absolute: 0.1 10*3/uL (ref 0.0–0.7)
Eosinophils Relative: 1.5 % (ref 0.0–5.0)
HCT: 41.7 % (ref 39.0–52.0)
Hemoglobin: 13.2 g/dL (ref 13.0–17.0)
Lymphocytes Relative: 40.2 % (ref 12.0–46.0)
Lymphs Abs: 1.8 10*3/uL (ref 0.7–4.0)
MCHC: 31.7 g/dL (ref 30.0–36.0)
MCV: 76.2 fl — ABNORMAL LOW (ref 78.0–100.0)
Monocytes Absolute: 0.3 10*3/uL (ref 0.1–1.0)
Monocytes Relative: 7.5 % (ref 3.0–12.0)
Neutro Abs: 2.2 10*3/uL (ref 1.4–7.7)
Neutrophils Relative %: 50.3 % (ref 43.0–77.0)
Platelets: 222 10*3/uL (ref 150.0–400.0)
RBC: 5.47 Mil/uL (ref 4.22–5.81)
RDW: 16.9 % — ABNORMAL HIGH (ref 11.5–15.5)
WBC: 4.4 10*3/uL (ref 4.0–10.5)

## 2022-06-22 LAB — COMPREHENSIVE METABOLIC PANEL
ALT: 17 U/L (ref 0–53)
AST: 20 U/L (ref 0–37)
Albumin: 4.1 g/dL (ref 3.5–5.2)
Alkaline Phosphatase: 52 U/L (ref 39–117)
BUN: 8 mg/dL (ref 6–23)
CO2: 32 mEq/L (ref 19–32)
Calcium: 9 mg/dL (ref 8.4–10.5)
Chloride: 102 mEq/L (ref 96–112)
Creatinine, Ser: 0.92 mg/dL (ref 0.40–1.50)
GFR: 95.29 mL/min (ref >60.00)
Glucose, Bld: 85 mg/dL (ref 70–99)
Potassium: 4.1 mEq/L (ref 3.5–5.1)
Sodium: 140 mEq/L (ref 135–145)
Total Bilirubin: 0.4 mg/dL (ref 0.2–1.2)
Total Protein: 6.8 g/dL (ref 6.0–8.3)

## 2022-06-22 LAB — LIPID PANEL
Cholesterol: 155 mg/dL (ref 0–200)
HDL: 62.4 mg/dL (ref >39.00)
LDL Cholesterol: 71 mg/dL (ref 0–99)
NonHDL: 92.19
Total CHOL/HDL Ratio: 2
Triglycerides: 108 mg/dL (ref 0.0–149.0)
VLDL: 21.6 mg/dL (ref 0.0–40.0)

## 2022-06-22 LAB — TSH: TSH: 1.66 u[IU]/mL (ref 0.35–5.50)

## 2022-06-22 LAB — VITAMIN B12: Vitamin B-12: 710 pg/mL (ref 211–911)

## 2022-06-22 LAB — MICROALBUMIN / CREATININE URINE RATIO
Creatinine,U: 332.5 mg/dL
Microalb Creat Ratio: 0.4 mg/g (ref 0.0–30.0)
Microalb, Ur: 1.2 mg/dL (ref 0.0–1.9)

## 2022-06-22 LAB — HEMOGLOBIN A1C: Hgb A1c MFr Bld: 6.7 % — ABNORMAL HIGH (ref 4.6–6.5)

## 2022-06-22 NOTE — Progress Notes (Signed)
Subjective  Chief Complaint  Patient presents with   Annual Exam    Pt here for Annual exam and is     HPI: [name] is a 52 y.o. male who presents to [facility] [place] today for a Male Wellness Visit. He also has the concerns and/or needs as listed above in the chief complaint. These will be addressed in addition to the Health Maintenance Visit.   Wellness Visit: annual visit with health maintenance review and exam   Health maintenance: Seen over a year ago.  Doing well except for chronic back pain.  Had another low back surgery last year but still struggling.  Saw Dr. Dutch Quint, neurosurgery yesterday.  Reports normal eye exam last month.  Gets at [place].  Will request records.  Immunizations are current.  Screens are current.  Colonoscopy done 2022, normal  Body mass index is 31.91 kg/m. Wt Readings from Last 3 Encounters:  06/22/22 222 lb 6.4 oz (100.9 kg)  01/03/22 221 lb 3.2 oz (100.3 kg)  10/09/21 209 lb (94.8 kg)     Chronic disease management visit and/or acute problem visit: Type 2 diabetes has been diet controlled.  He denies symptoms of hyperglycemia.  He eats well.  No complications.  Has been off metformin for several years.  Has had Pneumovax 23.  Not currently on ACE inhibitor has been normotensive.  Takes a statin. Hyperlipidemia on Crestor 20 mg daily.  Due for recheck.  Yes fasting for recheck today. History of CVA on statin for secondary prevention.  No new neurosymptoms. Major chronic depression well-controlled on Zoloft. Chronic pain on chronic narcotics, still with poor sleep due to pain.  Takes trazodone.  Also has CPAP for sleep apnea.  Sleep continues to be a problem. Requesting allergy referral: Reports gets rashes with certain foods. History of vitamin B12 deficiency on supplements.  Assessment  1. Annual physical exam   2. Type 2 diabetes mellitus with diabetic polyneuropathy, without long-term current use of insulin (HCC)   3.  Seizure disorder (HCC)   4. Major depression, recurrent, chronic (HCC)   5. Hyperlipidemia associated with type 2 diabetes mellitus (HCC), on statin   6. History of CVA (cerebrovascular accident)   7. Chronic midline low back pain with bilateral sciatica   8. Chronic pain syndrome   9. Obstructive sleep apnea   10. Vitamin B 12 deficiency   11. Food allergy      Plan  Male Wellness Visit: Age appropriate Health Maintenance and Prevention measures were discussed with patient. Included topics are cancer screening recommendations, ways to keep healthy (see AVS) including dietary and exercise recommendations, regular eye and dental care, use of seat belts, and avoidance of moderate alcohol use and tobacco use.  BMI: discussed patient's BMI and encouraged positive lifestyle modifications to help get to or maintain a target BMI. HM needs and immunizations were addressed and ordered. See below for orders. See HM and immunization section for updates. Routine labs and screening tests ordered including cmp, cbc and lipids where appropriate. Discussed recommendations regarding Vit D and calcium supplementation (see AVS)  Chronic disease f/u and/or acute problem visit: (deemed necessary to be done in addition to the wellness visit): Diabetes: Will check for control today.  He has been diet controlled.  No complications.  Recommend monitoring blood pressure.  Will have low threshold to start ACE inhibitor.  Check urine nephropathy screen today.  Routine guidance History of CVA: Monitoring blood pressure and cholesterol levels  Hyperlipidemia on Crestor 20 mg daily.  Recheck fasting lipids and LFTs.  Has been at goal Continue Zoloft 50 mg daily for depression, well-controlled Sleep apnea on CPAP Chronic pain per neurosurgery and chronic pain management on hydrocodone. Insomnia secondary to pain, trazodone 100 nightly. Refer to allergist to assess for food allergies Recheck vitamin B12 levels  Follow  up: 6 months for diabetes and blood pressure recheck Commons side effects, risks, benefits, and alternatives for medications and treatment plan prescribed today were discussed, and the patient expressed understanding of the given instructions. Patient is instructed to call or message via MyChart if he/she has any questions or concerns regarding our treatment plan. No barriers to understanding were identified. We discussed Red Flag symptoms and signs in detail. Patient expressed understanding regarding what to do in case of urgent or emergency type symptoms.  Medication list was reconciled, printed and provided to the patient in AVS. Patient instructions and summary information was reviewed with the patient as documented in the AVS. This note was prepared with assistance of Dragon voice recognition software. Occasional wrong-word or sound-a-like substitutions may have occurred due to the inherent limitations of voice recognition software  Orders Placed This Encounter  Procedures   CBC with Differential/Platelet   Comprehensive metabolic panel   Lipid panel   TSH   Hemoglobin A1c   Vitamin B12   Microalbumin / creatinine urine ratio   Ambulatory referral to Allergy   No orders of the defined types were placed in this encounter.    Patient Active Problem List   Diagnosis Date Noted   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 12/03/2019   Chronic post-traumatic stress disorder (PTSD) after military combat 06/03/2017   Major depression, recurrent, chronic (HCC) 01/01/2017   Chronic pain syndrome 09/03/2016   Obesity (BMI 30-39.9) 03/15/2013   Type 2 diabetes mellitus with diabetic polyneuropathy, without long-term current use of insulin (HCC) 12/22/2010   Hyperlipidemia associated with type 2 diabetes mellitus (HCC), on statin 08/23/2006   Obstructive sleep apnea 08/23/2006   Chronic low back pain 08/23/2006   Seizure disorder (HCC) 08/23/2006   History of CVA (cerebrovascular accident)  08/23/2006   Insomnia 07/15/2015   Cervical spondylosis without myelopathy 02/06/2015   BPH (benign prostatic hyperplasia) 11/15/2012   Chronic pain of both knees 12/08/2008   Gout 08/23/2006   Vitamin B 12 deficiency 12/09/2017   Allergic rhinitis 08/23/2006   Degenerative spondylolisthesis 01/10/2022   Health Maintenance  Topic Date Due   HEMOGLOBIN A1C  09/08/2021   Diabetic kidney evaluation - Urine ACR  03/11/2022   INFLUENZA VACCINE  09/01/2022   Diabetic kidney evaluation - eGFR measurement  01/04/2023   OPHTHALMOLOGY EXAM  06/01/2023   FOOT EXAM  06/22/2023   COLONOSCOPY (Pts 45-23yrs Insurance coverage will need to be confirmed)  01/08/2031   DTaP/Tdap/Td (5 - Td or Tdap) 02/20/2031   COVID-19 Vaccine  Completed   Hepatitis C Screening  Completed   HIV Screening  Completed   Zoster Vaccines- Shingrix  Completed   HPV VACCINES  Aged Out   Immunization History  Administered Date(s) Administered   COVID-19, mRNA, vaccine(Comirnaty)12 years and older 11/11/2021   Influenza Split 09/13/2011   Influenza Whole 11/18/2008   Influenza,inj,Quad PF,6+ Mos 11/15/2012, 11/12/2013, 09/26/2016, 11/21/2017, 11/19/2018, 12/11/2019, 10/08/2020, 02/19/2021   Moderna Covid-19 Vaccine Bivalent Booster 73yrs & up 10/22/2020   PFIZER(Purple Top)SARS-COV-2 Vaccination 04/14/2019, 05/12/2019, 01/05/2020, 05/23/2020   PNEUMOCOCCAL CONJUGATE-20 10/22/2020   Pneumococcal Polysaccharide-23 12/22/2010   Td 03/16/2009,  02/19/2021   Tdap 02/17/2011, 02/19/2020   Zoster Recombinat (Shingrix) 07/05/2019, 01/10/2020   We updated and reviewed the patient's past history in detail and it is documented below. Allergies: Patient is allergic to atorvastatin, pregabalin, tizanidine, and cymbalta [duloxetine hcl]. Past Medical History  has a past medical history of Adrenal abnormality (HCC), Allergy, Anxiety, Arthritis, Atypical chest pain, BPH (benign prostatic hypertrophy), Complication of anesthesia,  Depression, GERD (gastroesophageal reflux disease), Gout, H/O: CVA (cardiovascular accident), Hyperlipidemia, Knee pain, bilateral, LBP (low back pain), Neuromuscular disorder (HCC), OSA (obstructive sleep apnea), Plantar fasciitis, PTSD (post-traumatic stress disorder), Seizure disorder (HCC), Sleep apnea, and Stroke (HCC) (2001). Past Surgical History Patient  has a past surgical history that includes Trigger finger release (Right, 05/14/2015); Neck surgery; Shoulder surgery (Right); Heel spur surgery (Left); Endoscopic plantar fasciotomy; Carpal tunnel release (Bilateral); Gastric Roux-En-Y (N/A, 12/03/2019); Colonoscopy; Esophagogastroduodenoscopy; Lumbar laminectomy/decompression microdiscectomy (2022); and Upper gastrointestinal endoscopy. Social History Patient  reports that he quit smoking about 31 years ago. His smoking use included cigarettes. He has never used smokeless tobacco. He reports that he does not drink alcohol and does not use drugs. Family History family history includes Allergies in his daughter; Cancer in his maternal uncle; Diabetes in his father; Heart attack in his maternal grandfather; Hypertension in his father. Review of Systems: Constitutional: negative for fever or malaise Ophthalmic: negative for photophobia, double vision or loss of vision Cardiovascular: negative for chest pain, dyspnea on exertion, or new LE swelling Respiratory: negative for SOB or persistent cough Gastrointestinal: negative for abdominal pain, change in bowel habits or melena Genitourinary: negative for dysuria or gross hematuria Musculoskeletal: negative for new gait disturbance or muscular weakness Integumentary: negative for new or persistent rashes Neurological: negative for TIA or stroke symptoms Psychiatric: negative for SI or delusions Allergic/Immunologic: negative for hives  Patient Care Team    Relationship Specialty Notifications Start End  Willow Ora, MD PCP - General  Family Medicine  02/27/19   Surgery, [facility] Surgery  04/08/19   Ardell Isaacs, MD Consulting Physician Pain Medicine  05/21/19   Julio Sicks, MD Consulting Physician Neurosurgery  06/03/20   Hilarie Fredrickson, MD Consulting Physician Gastroenterology  06/03/20    Objective  Vitals: BP 120/82   Pulse 84   Temp 97.8 F (36.6 C)   Ht 5\' 10"  (1.778 m)   Wt 222 lb 6.4 oz (100.9 kg)   SpO2 98%   BMI 31.91 kg/m  General:  Well developed, well nourished, no acute distress  Psych:  Alert and orientedx3,normal mood and affect HEENT:  Normocephalic, atraumatic, non-icteric sclera,  oropharynx is clear without mass or exudate, supple neck without adenopathy, or thyromegaly Cardiovascular:  Normal S1, S2, RRR without gallop, rub or murmur,  Respiratory:  Good breath sounds bilaterally, CTAB with normal respiratory effort Gastrointestinal: normal bowel sounds, soft, non-tender, no noted masses. No HSM MSK: Joints are without erythema or swelling.  Antalgic gait, good muscle bulk Skin:  Warm, no rashes Neurologic:    Mental status is normal.  Gross motor and sensory exams are normal. No tremor  Diabetic Foot Exam: Appearance - no lesions, ulcers or significant calluses Skin - no sigificant pallor or erythema Normal sensation Pulses - +2 distally bilaterally

## 2022-06-22 NOTE — Patient Instructions (Signed)
Please return in 6 months for diabetes recheck ° °I will release your lab results to you on your MyChart account with further instructions. You may see the results before I do, but when I review them I will send you a message with my report or have my assistant call you if things need to be discussed. Please reply to my message with any questions. Thank you!  ° °If you have any questions or concerns, please don't hesitate to send me a message via MyChart or call the office at [phone]. Thank you for visiting with us today! It's our pleasure caring for you.  ° ° °

## 2022-06-23 ENCOUNTER — Encounter: Payer: Self-pay | Admitting: Family Medicine

## 2022-06-23 NOTE — Progress Notes (Signed)
See mychart note Dear Mr. Jaggard, Your lab work results look good.  Diabetes remains diet controlled.  Other lab tests stable.  Cholesterol levels are just above goal.  You may continue your Lipitor 20 mg a night and improve your diet or we could increase her Lipitor dose to 40 mg nightly.  Please let me know which you prefer.  You may reply to this message.  No other changes are needed at this time.  Sincerely, Dr. Mardelle Matte

## 2022-06-24 ENCOUNTER — Encounter: Payer: Self-pay | Admitting: Family Medicine

## 2022-06-24 NOTE — Telephone Encounter (Signed)
Please abstract his diabetic eye exam.  There is no retinopathy.  It is attached to his message.

## 2022-06-24 NOTE — Telephone Encounter (Signed)
Eye Exam has been abstracted

## 2022-07-12 ENCOUNTER — Encounter (HOSPITAL_COMMUNITY): Payer: Self-pay | Admitting: *Deleted

## 2022-07-28 DIAGNOSIS — Z9884 Bariatric surgery status: Secondary | ICD-10-CM | POA: Diagnosis not present

## 2022-08-01 IMAGING — RF DG LUMBAR SPINE 2-3V
1 series · 2 of 2 positions shown · non-contrast
Comparison: Lumbar spine MRI 09/09/2019.

CLINICAL DATA: Elective surgery. Additional history provided:
Lumbar 5-sacral 1 posterior lumbar interbody fusion. Provided
fluoroscopy time 33.9 seconds (24.45 mGy).

EXAM:
LUMBAR SPINE - 2-3 VIEW; DG C-ARM 1-60 MIN

[Series 1: run · 2 of 2 slices shown]
[im 1/2]
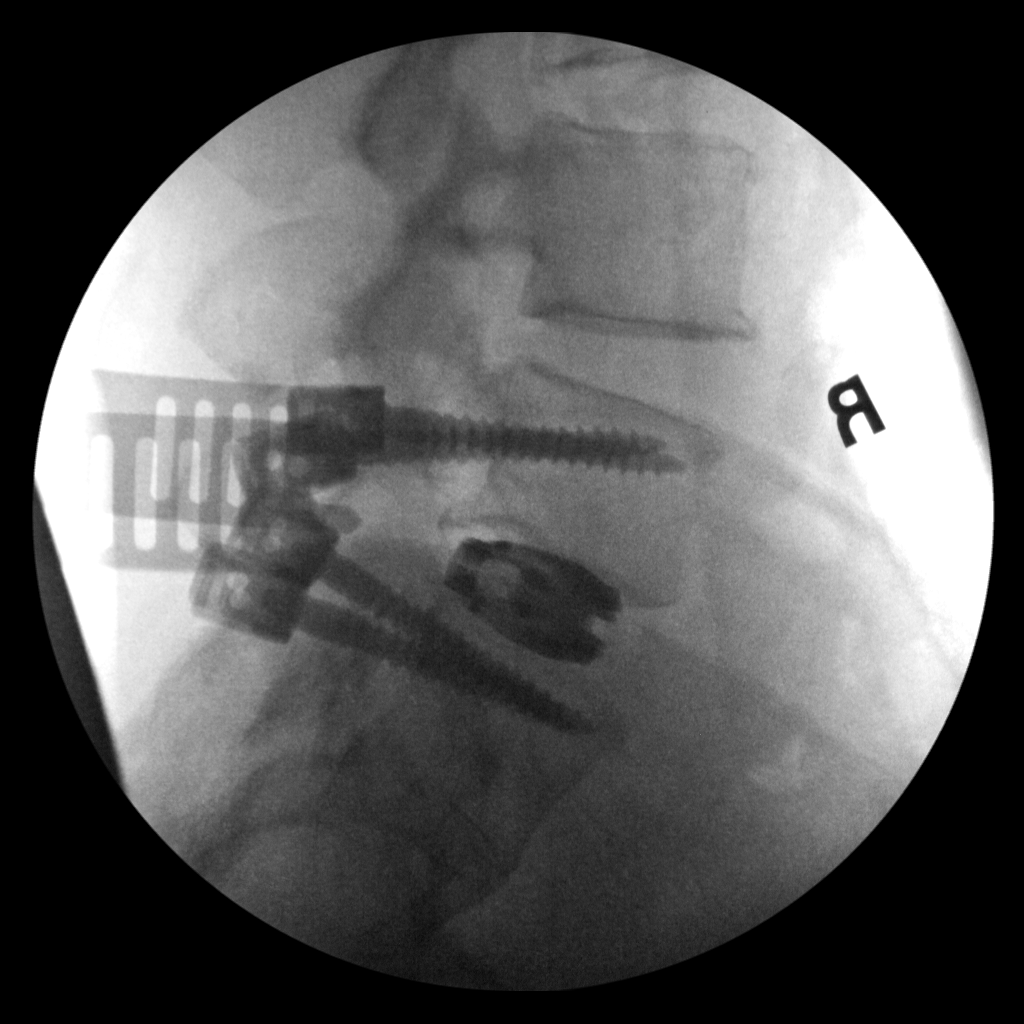
[im 2/2]
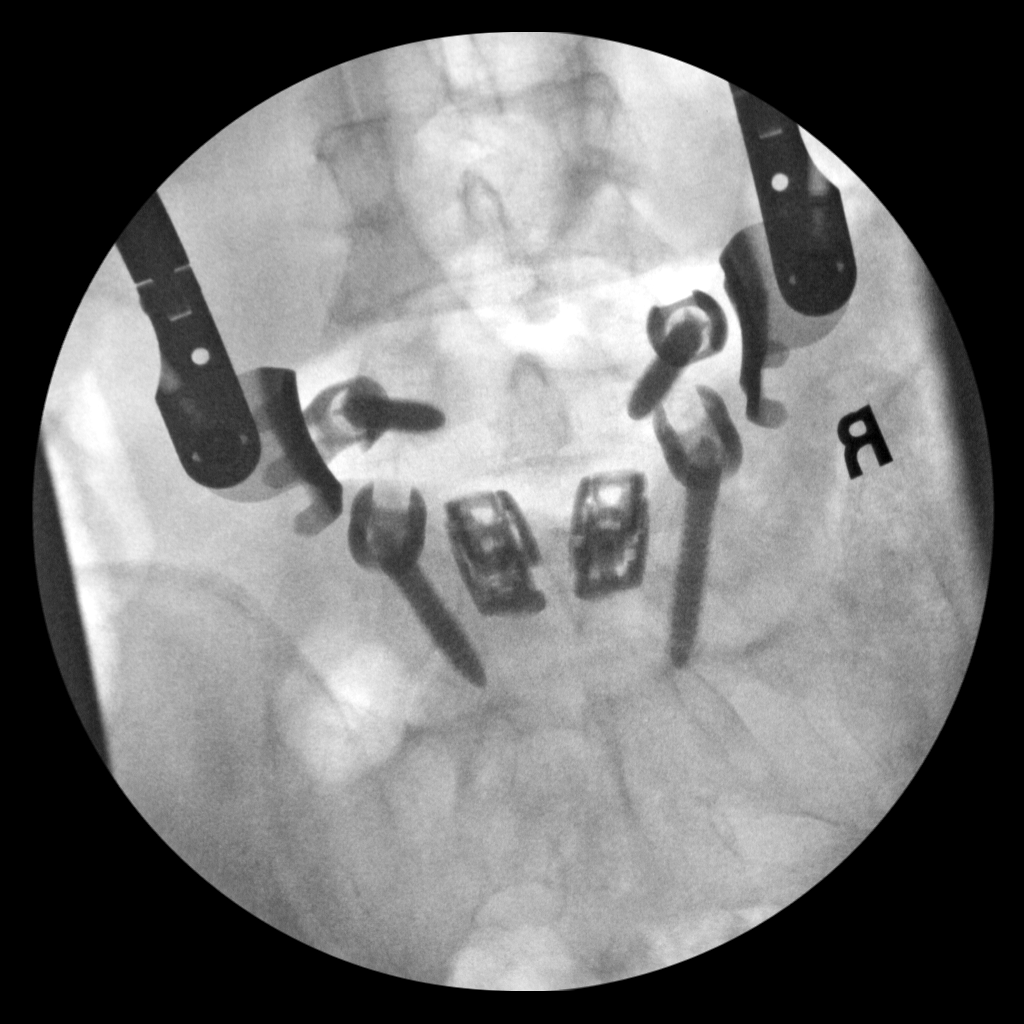

[2 of 2 positions shown; findings below may reference images not displayed]

FINDINGS: AP and lateral view intraoperative fluoroscopic images of the
lumbosacral spine are submitted, 2 images total. The images
demonstrate bilateral pedicle screws at the L5 and S1 levels.
Vertical interconnecting rods were not present at the time the
images were taken. An L5-S1 interbody device is also present.
Overlying retractors.
IMPRESSION: Two intraoperative fluoroscopic images of the lumbosacral spine from
L5-S1 fusion, as described.

## 2022-08-01 IMAGING — RF DG C-ARM 1-60 MIN
1 series · 2 of 2 positions shown · non-contrast
Comparison: Lumbar spine MRI 09/09/2019.

CLINICAL DATA: Elective surgery. Additional history provided:
Lumbar 5-sacral 1 posterior lumbar interbody fusion. Provided
fluoroscopy time 33.9 seconds (24.45 mGy).

EXAM:
LUMBAR SPINE - 2-3 VIEW; DG C-ARM 1-60 MIN

[Series 1: run · 2 of 2 slices shown]
[im 1/2]
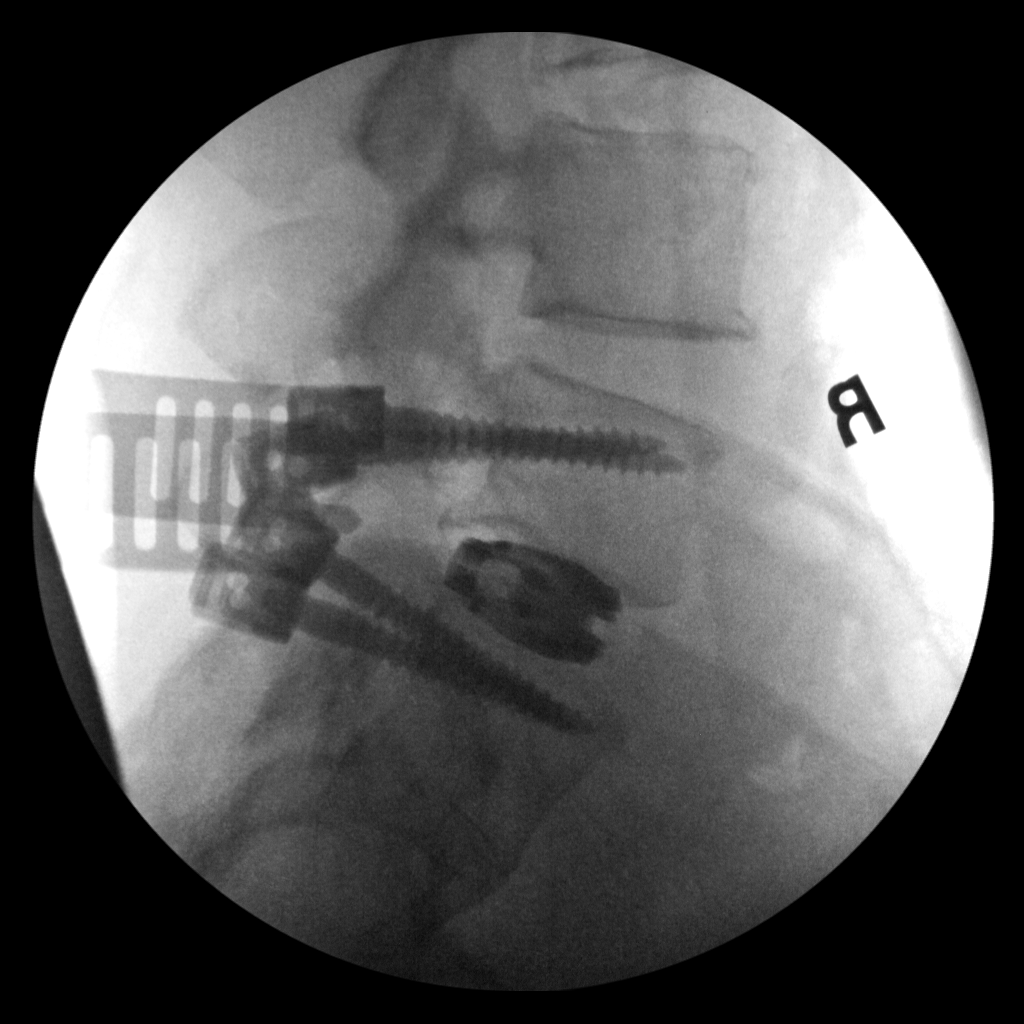
[im 2/2]
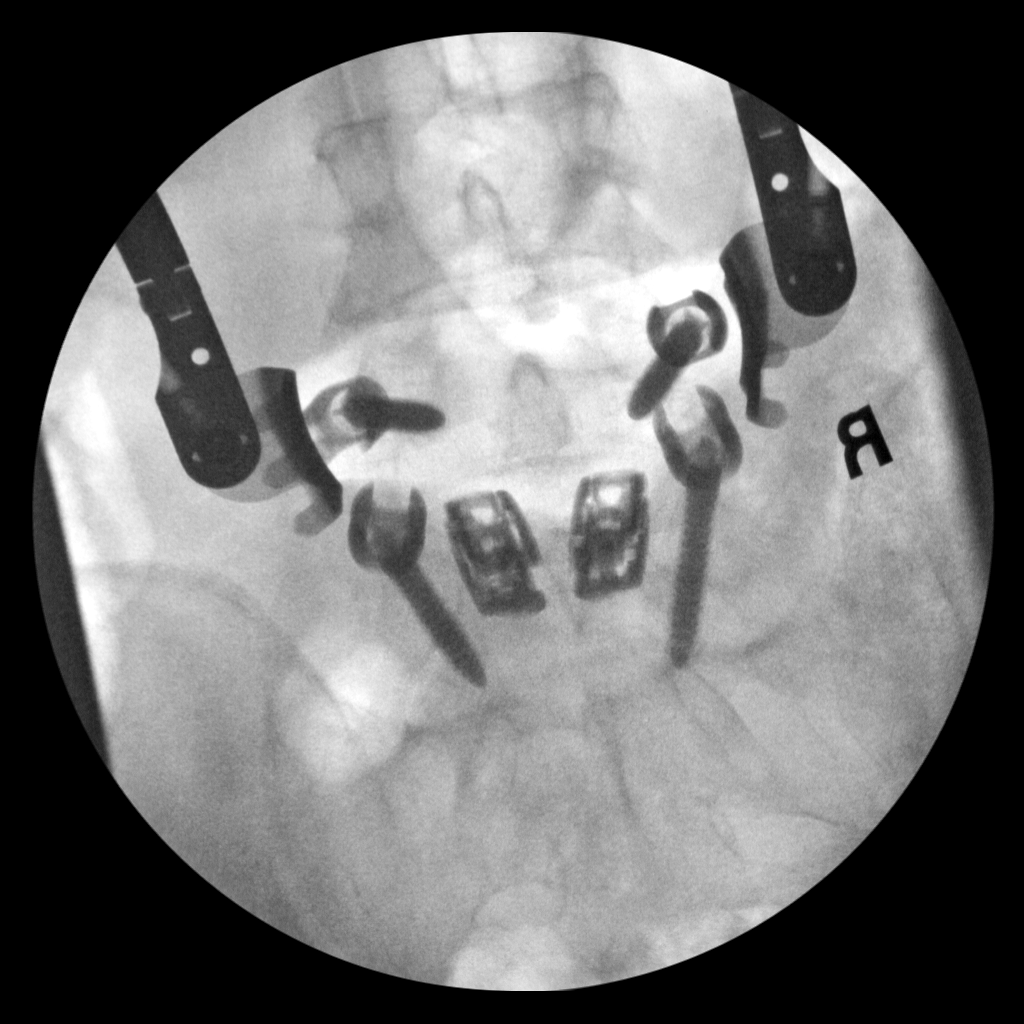

[2 of 2 positions shown; findings below may reference images not displayed]

FINDINGS: AP and lateral view intraoperative fluoroscopic images of the
lumbosacral spine are submitted, 2 images total. The images
demonstrate bilateral pedicle screws at the L5 and S1 levels.
Vertical interconnecting rods were not present at the time the
images were taken. An L5-S1 interbody device is also present.
Overlying retractors.
IMPRESSION: Two intraoperative fluoroscopic images of the lumbosacral spine from
L5-S1 fusion, as described.

## 2022-08-10 ENCOUNTER — Ambulatory Visit (INDEPENDENT_AMBULATORY_CARE_PROVIDER_SITE_OTHER): Payer: BC Managed Care – PPO | Admitting: Internal Medicine

## 2022-08-10 ENCOUNTER — Other Ambulatory Visit: Payer: Self-pay

## 2022-08-10 ENCOUNTER — Encounter: Payer: Self-pay | Admitting: Internal Medicine

## 2022-08-10 VITALS — BP 108/80 | HR 82 | Temp 98.7°F | Resp 18 | Ht 68.11 in | Wt 224.5 lb

## 2022-08-10 DIAGNOSIS — L501 Idiopathic urticaria: Secondary | ICD-10-CM

## 2022-08-10 DIAGNOSIS — J3089 Other allergic rhinitis: Secondary | ICD-10-CM

## 2022-08-10 DIAGNOSIS — L272 Dermatitis due to ingested food: Secondary | ICD-10-CM

## 2022-08-10 DIAGNOSIS — L2084 Intrinsic (allergic) eczema: Secondary | ICD-10-CM

## 2022-08-10 DIAGNOSIS — J302 Other seasonal allergic rhinitis: Secondary | ICD-10-CM

## 2022-08-10 MED ORDER — FLUTICASONE PROPIONATE 50 MCG/ACT NA SUSP: 2.0000 | Freq: Every day | NASAL | 5 refills | Status: DC

## 2022-08-10 MED ORDER — TRIAMCINOLONE ACETONIDE 0.1 % EX OINT: TOPICAL_OINTMENT | CUTANEOUS | 5 refills | Status: DC

## 2022-08-10 MED ORDER — CETIRIZINE HCL 10 MG PO TABS: 10.0000 mg | ORAL_TABLET | Freq: Two times a day (BID) | ORAL | 5 refills | Status: DC | PRN

## 2022-08-10 MED ORDER — FAMOTIDINE 20 MG PO TABS: 20.0000 mg | ORAL_TABLET | Freq: Two times a day (BID) | ORAL | 5 refills | Status: AC | PRN

## 2022-08-10 MED ORDER — AZELASTINE HCL 0.1 % NA SOLN: 1.0000 | Freq: Two times a day (BID) | NASAL | 5 refills | Status: DC | PRN

## 2022-08-10 MED ORDER — HYDROCORTISONE 2.5 % EX CREA: TOPICAL_CREAM | Freq: Two times a day (BID) | CUTANEOUS | 5 refills | Status: DC

## 2022-08-10 NOTE — Progress Notes (Signed)
NEW PATIENT  Date of Service/Encounter:  08/10/22  Consult requested by: Willow Ora, MD   Subjective:   [name] (DOB: 1970-06-30) is a 53 y.o. male who presents to the clinic on 08/10/2022 with a chief complaint of Urticaria, Food Intolerance, and Establish Care .    History obtained from: chart review and patient.   Rashes:  Onset earlier this year.  Reports having both eczema and hives.   He isn't sure if he had childhood eczema.  Mostly breaks out on arms but can be all over.  Some of them are like hives, itchy/raised/red.  Others are more dry itchy patches.   Tried triamcinolone topical, it did help dry it up. Not sure how often it is happening.   Does not have any pictures.   Does not moisturize regularly. Tomatoes, pineapples, citrus cause him to itch.  Rhinitis:  Started junior high school.   Symptoms include: nasal congestion, rhinorrhea, and post nasal drainage  Occurs year-round Potential triggers: not sure   Treatments tried:  Flonase Zyrtec; last use was a long time ago  Previous allergy testing: none History of reflux/heartburn: sometimes has reflux and treats it with Pepcid.   History of sinus surgery: no Nonallergic triggers: none    Past Medical History: Past Medical History:  Diagnosis Date   Adrenal abnormality (HCC)    Allergy    Anxiety    Arthritis    Atypical chest pain    BPH (benign prostatic hypertrophy)    Complication of anesthesia    Hard time waking up after neck surgery   Depression    GERD (gastroesophageal reflux disease)    "not since Gastric Bypass"   Gout    H/O: CVA (cardiovascular accident)    Hyperlipidemia    Knee pain, bilateral    LBP (low back pain)    Neuromuscular disorder (HCC)    OSA (obstructive sleep apnea)    Plantar fasciitis    PTSD (post-traumatic stress disorder)    Seizure disorder (HCC)    Sleep apnea    Stroke (HCC) 2001   some memory loss- math , science    Past Surgical  History: Past Surgical History:  Procedure Laterality Date   CARPAL TUNNEL RELEASE Bilateral    COLONOSCOPY     ENDOSCOPIC PLANTAR FASCIOTOMY     ESOPHAGOGASTRODUODENOSCOPY     GASTRIC ROUX-EN-Y N/A 12/03/2019   Procedure: LAPAROSCOPIC ROUX-EN-Y GASTRIC BYPASS WITH UPPER ENDOSCOPY;  Surgeon: Luretha Murphy, MD;  Location: [facility];  Service: General;  Laterality: N/A;   HEEL SPUR SURGERY Left    LUMBAR LAMINECTOMY/DECOMPRESSION MICRODISCECTOMY  2022   Dr. Jordan Likes   NECK SURGERY     Titanium Plate in Vertebrae   SHOULDER SURGERY Right    TRIGGER FINGER RELEASE Right 05/14/2015   Procedure: RIGHT THUMB TRIGGER RELEASE ;  Surgeon: Betha Loa, MD;  Location: [facility];  Service: Orthopedics;  Laterality: Right;   UPPER GASTROINTESTINAL ENDOSCOPY      Family History: Family History  Problem Relation Age of Onset   Diabetes Father    Hypertension Father    Asthma Brother    Allergic rhinitis Brother    Cancer Maternal Uncle    Heart attack Maternal Grandfather    Allergies Daughter    Colon cancer Neg Hx    Colon polyps Neg Hx    Esophageal cancer Neg Hx    Stomach cancer Neg Hx    Rectal cancer Neg Hx  Social History:  Lives in a 10 year apartment Flooring in bedroom: tile Pets: dogs Tobacco use/exposure: [phone] Job: retired  Medication List:  Allergies as of 08/10/2022       Reactions   Atorvastatin Swelling   Pregabalin    Other reaction(s): Unknown   Tizanidine Hives   Cymbalta [duloxetine Hcl]    Stomach upset, difficulty urinating, decreased libido.        Medication List        Accurate as of August 10, 2022 10:56 AM. If you have any questions, ask your nurse or doctor.          STOP taking these medications    triamcinolone cream 0.1 % Commonly known as: KENALOG Replaced by: triamcinolone ointment 0.1 % Stopped by: Birder Robson, MD       TAKE these medications    acetaminophen 500 MG tablet Commonly known as:  TYLENOL Take 1,000 mg by mouth every 6 (six) hours as needed for mild pain or moderate pain.   azelastine 0.1 % nasal spray Commonly known as: ASTELIN Place 1 spray into both nostrils 2 (two) times daily as needed for rhinitis. Use in each nostril as directed Started by: Birder Robson, MD   calcium citrate-vitamin D 500-400 MG-UNIT chewable tablet Chew 1 tablet by mouth 3 (three) times daily.   cetirizine 10 MG tablet Commonly known as: ZYRTEC Take 1 tablet (10 mg total) by mouth 2 (two) times daily as needed (hives or allergies). What changed:  when to take this reasons to take this Changed by: Birder Robson, MD   cyclobenzaprine 10 MG tablet Commonly known as: FLEXERIL Take 10 mg by mouth 3 (three) times daily as needed for muscle spasms.   diclofenac Sodium 1 % Gel Commonly known as: VOLTAREN Apply 2 g topically daily as needed (pain).   famotidine 20 MG tablet Commonly known as: Pepcid Take 1 tablet (20 mg total) by mouth 2 (two) times daily as needed (hives). Started by: Birder Robson, MD   fluticasone 50 MCG/ACT nasal spray Commonly known as: FLONASE Place 2 sprays into both nostrils daily. What changed: how much to take Changed by: Birder Robson, MD   gabapentin 300 MG capsule Commonly known as: NEURONTIN Take 300 mg by mouth 3 (three) times daily.   HYDROcodone-acetaminophen 10-325 MG tablet Commonly known as: NORCO Take 1 tablet by mouth every 4 (four) hours as needed for moderate pain ((score 4 to 6)).   hydrocortisone 2.5 % cream Apply topically 2 (two) times daily. Apply twice daily for flare ups above neck, maximum 7 days. Started by: Birder Robson, MD   lidocaine 5 % ointment Commonly known as: XYLOCAINE Apply 1 Application topically daily as needed.   methocarbamol 500 MG tablet Commonly known as: ROBAXIN Take 1 tablet (500 mg total) by mouth 4 (four) times daily.   miconazole 2 % cream Commonly known as: MICOTIN Apply 1 Application topically  2 (two) times daily.   multivitamin with minerals tablet Take 1 tablet by mouth daily.   Muscle Rub 10-15 % Crea Apply 1 Application topically as needed for muscle pain.   NON FORMULARY Pt uses a cpap nightly   rosuvastatin 20 MG tablet Commonly known as: CRESTOR Take 1 tablet (20 mg total) by mouth daily.   sildenafil 100 MG tablet Commonly known as: VIAGRA Take 50 mg by mouth as needed for erectile dysfunction (Take one-half tablet by mouth as instructed (take 1 hour prior to sexual  activity *do not exceed 1 dose per 24 hour period*)).   traZODone 100 MG tablet Commonly known as: DESYREL Take 200 mg by mouth at bedtime as needed for sleep.   triamcinolone ointment 0.1 % Commonly known as: KENALOG Apply twice daily for flare ups below neck, maximum 10 days. Replaces: triamcinolone cream 0.1 % Started by: Birder Robson, MD   venlafaxine XR 150 MG 24 hr capsule Commonly known as: EFFEXOR-XR Take 150 mg by mouth 2 (two) times daily.         REVIEW OF SYSTEMS: Pertinent positives and negatives discussed in HPI.   Objective:   Physical Exam: BP 108/80   Pulse 82   Temp 98.7 F (37.1 C) (Temporal)   Resp 18   Ht 5' 8.11" (1.73 m)   Wt 224 lb 8 oz (101.8 kg)   SpO2 97%   BMI 34.02 kg/m  Body mass index is 34.02 kg/m. GEN: alert, well developed HEENT: clear conjunctiva, TM grey and translucent, nose with + inferior turbinate hypertrophy, pink nasal mucosa, slight clear rhinorrhea, + cobblestoning HEART: regular rate and rhythm, no murmur LUNGS: clear to auscultation bilaterally, no coughing, unlabored respiration ABDOMEN: soft, non distended  SKIN: small area of dry papular lesions on L upper arm  Reviewed:  07/28/2022: followed by [facility] for hx of Roux-en-Y gastric bypass. Lost about 40 lbs.  Doing well.  07/28/2022: [facility] with PMH of anxiety, DM, HTN, HLD, MDD, low back pain, OA, seizures, OSA, TIA. On Flonase, Gabapentin, Diclofenac Gel,  Oxycodone, Crestor, Trazodone, Venlafaxine, Triamcinolone topical.   06/13/2022: seen in urgent care for rash of L inner arm and R axilla for 3 days that is itchy.  Given miconazole and triamcinolone.    Skin Testing:  Skin prick testing was placed, which includes aeroallergens/foods, histamine control, and saline control.  Verbal consent was obtained prior to placing test.  Patient tolerated procedure well.  Allergy testing results were read and interpreted by myself, documented by clinical staff. Adequate positive and negative control.  Results discussed with patient/family.  Airborne Adult Perc - 08/10/22 0904     Time Antigen Placed 8295    Allergen Manufacturer Waynette Buttery    Location Back    Number of Test 55    1. Control-Buffer 50% Glycerol Negative    2. Control-Histamine 3+    3. Bahia Negative    4. French [place] Negative    5. Johnson Negative    6. Kentucky Blue 3+    7. Meadow Fescue 3+    8. Perennial Rye 3+    9. Timothy 3+    10. Ragweed Mix Negative    11. Cocklebur Negative    12. Plantain,  English Negative    13. Baccharis Negative    14. Dog Fennel Negative    15. Russian Thistle Negative    16. Lamb's Quarters Negative    17. Sheep Sorrell Negative    18. Rough Pigweed Negative    19. Marsh Elder, Rough Negative    20. Mugwort, Common Negative    21. Box, Elder Negative    22. Cedar, red Negative    23. Sweet Gum Negative    24. Pecan Pollen Negative    25. Pine Mix Negative    26. Walnut, Black Pollen Negative    27. Red Mulberry Negative    28. Ash Mix Negative    29. Birch Mix Negative    30. Beech American Negative    31. Cottonwood, Guinea[place]iss[place]  Negative    32. Hickory, White Negative    33. Maple Mix Negative    34. Oak, [place] Mix Negative    35. Sycamore Eastern Negative    36. Alternaria Alternata Negative    37. Cladosporium Herbarum Negative    38. Aspergillus Mix Negative    39. Penicillium Mix Negative    40. Bipolaris Sorokiniana  (Helminthosporium) Negative    41. Drechslera Spicifera (Curvularia) Negative    42. Mucor Plumbeus Negative    43. Fusarium Moniliforme Negative    44. Aureobasidium Pullulans (pullulara) Negative    45. Rhizopus Oryzae Negative    46. Botrytis Cinera Negative    47. Epicoccum Nigrum Negative    48. Phoma Betae Negative    49. Dust Mite Mix Negative    50. Cat Hair 10,000 BAU/ml Negative    51.  Dog Epithelia Negative    52. Mixed Feathers Negative    53. Horse Epithelia Negative    54. Cockroach, German Negative    55. Tobacco Leaf Negative             Intradermal - 08/10/22 0942     Time Antigen Placed 6644    Allergen Manufacturer Waynette Buttery    Location Arm    Number of Test 15    Control Negative    Bahia 3+    [place] 2+    Johnson 2+    Ragweed Mix Negative    Weed Mix Negative    Tree Mix 3+    Mold 1 3+    Mold 2 2+    Mold 3 3+    Mold 4 Negative    Mite Mix Negative    Cat Negative    Dog Negative    Cockroach Negative             Food Adult Perc - 08/10/22 0900     Time Antigen Placed 0347    Allergen Manufacturer Waynette Buttery    Location Back    Number of allergen test 17    1. Peanut Negative    2. Soybean Negative    3. Wheat Negative    4. Sesame Negative    5. Milk, Cow Negative    6. Casein Negative    7. Egg White, Chicken Negative    8. Shellfish Mix Negative    9. Fish Mix Negative    10. Cashew Negative    11. Walnut Food Negative    12. Almond Negative    13. Hazelnut Negative    38. Tomato Negative    55. Orange  Negative    56. Lemon Negative    65. Pineapple Negative               Assessment:   1. Idiopathic urticaria   2. Intrinsic atopic dermatitis   3. Seasonal and perennial allergic rhinitis     Plan/Recommendations:  Allergic Rhinitis: - Due to turbinate hypertrophy and unresponsive to OTC meds, performed skin testing to identify aeroallergen triggers.   - Positive skin test 08/2022: trees, grasses, molds -  Avoidance measures discussed. - Use nasal saline rinses before nose sprays such as with Neilmed Sinus Rinse.  Use distilled water.   - Use Flonase 2 sprays each nostril daily. Aim upward and outward. - Use Azelastine 1-2 sprays each nostril twice daily as needed for congestion, drainage, sneezing, runny nose. Aim upward and outward. - Use Zyrtec 10 mg daily.  - Consider allergy shots as long term control of  your symptoms by teaching your immune system to be more tolerant of your allergy triggers  Pruritus with Foods - Negative SPT 08/2022 to tomatoes, pineapple, citrus.  This is not an IgE mediated food allergy but can be an intolerance.  If you can tolerate them in small amounts or cooked,continue eating them.   Idiopathic Urticaria (Hives): - Describing two different types of rashes, hives and eczematous.  Please take pictures and videos next time you have the rash.   Will also keep fungal and contact dermatitis in differential.   - At this time etiology of hives and swelling is unknown. Hives can be caused by a variety of different triggers including illness/infection, exercise, pressure, vibrations, extremes of temperature to name a few however majority of the time there is no identifiable trigger.  -Start Zyrtec 10mg  daily.   -If hives still reoccur, increase to Zyrtec 10mg  twice daily.   -If hives still reoccur, add Pepcid 20mg  twice daily and continue Zyrtec 10mg  twice daily.  Eczema: - Do a daily soaking tub bath in warm water for 10-15 minutes.  - Use a gentle, unscented cleanser at the end of the bath (such as Dove unscented bar or baby wash, or Aveeno sensitive body wash). Then rinse, pat half-way dry, and apply a gentle, unscented moisturizer cream or ointment (Cerave, Cetaphil, Eucerin, Aveeno)  all over while still damp. Dry skin makes the itching and rash of eczema worse. The skin should be moisturized with a gentle, unscented moisturizer at least twice daily.  - Use only unscented  liquid laundry detergent. - Apply prescribed topical steroid (triamcinolone 0.1% below neck or hydrocortisone 2.5% above neck) to flared areas (red and thickened eczema) after the moisturizer has soaked into the skin (wait at least 30 minutes). Taper off the topical steroids as the skin improves. Do not use topical steroid for more than 7-10 days at a time.      Return in about 6 weeks (around 09/21/2022).  Alesia Morin, MD [facility]

## 2022-08-10 NOTE — Addendum Note (Signed)
Addended by: Kellie Simmering, Krzysztof Reichelt on: 08/10/2022 11:31 AM   Modules accepted: Orders

## 2022-08-10 NOTE — Patient Instructions (Addendum)
Allergic Rhinitis:  - Positive skin test 08/2022: trees, grasses, molds - Avoidance measures discussed. - Use nasal saline rinses before nose sprays such as with Neilmed Sinus Rinse.  Use distilled water.   - Use Flonase 2 sprays each nostril daily. Aim upward and outward. - Use Azelastine 1-2 sprays each nostril twice daily as needed for congestion, drainage, sneezing, runny nose. Aim upward and outward. - Use Zyrtec 10 mg daily.  - Consider allergy shots as long term control of your symptoms by teaching your immune system to be more tolerant of your allergy triggers  Pruritus with Foods - Negative SPT to tomatoes, pineapple, citrus.  This is not an IgE mediated food allergy but can be an intolerance.  If you can tolerate them in small amounts or cooked,continue eating them.   Idiopathic Urticaria (Hives): - Describing two different types of rashes, hives and eczematous.  Please take pictures and videos next time you have the rash.   Will also keep fungal and contact dermatitis in differential.   - At this time etiology of hives and swelling is unknown. Hives can be caused by a variety of different triggers including illness/infection, exercise, pressure, vibrations, extremes of temperature to name a few however majority of the time there is no identifiable trigger.  -Start Zyrtec 10mg  daily.   -If hives still reoccur, increase to Zyrtec 10mg  twice daily.   -If hives still reoccur, add Pepcid 20mg  twice daily and continue Zyrtec 10mg  twice daily.  Eczema: - Do a daily soaking tub bath in warm water for 10-15 minutes.  - Use a gentle, unscented cleanser at the end of the bath (such as Dove unscented bar or baby wash, or Aveeno sensitive body wash). Then rinse, pat half-way dry, and apply a gentle, unscented moisturizer cream or ointment (Cerave, Cetaphil, Eucerin, Aveeno)  all over while still damp. Dry skin makes the itching and rash of eczema worse. The skin should be moisturized with a gentle,  unscented moisturizer at least twice daily.  - Use only unscented liquid laundry detergent. - Apply prescribed topical steroid (triamcinolone 0.1% below neck or hydrocortisone 2.5% above neck) to flared areas (red and thickened eczema) after the moisturizer has soaked into the skin (wait at least 30 minutes). Taper off the topical steroids as the skin improves. Do not use topical steroid for more than 7-10 days at a time.    ALLERGEN AVOIDANCE MEASURES  Molds - Indoor avoidance Use air conditioning to reduce indoor humidity.  Do not use a humidifier. Keep indoor humidity at 30 - 40%.  Use a dehumidifier if needed. In the bathroom use an exhaust fan or open a window after showering.  Wipe down damp surfaces after showering.  Clean bathrooms with a mold-killing solution (diluted bleach, or products like Tilex, etc) at least once a month. In the kitchen use an exhaust fan to remove steam from cooking.  Throw away spoiled foods immediately, and empty garbage daily.  Empty water pans below self-defrosting refrigerators frequently. Vent the clothes dryer to the outside. Limit indoor houseplants; mold grows in the dirt.  No houseplants in the bedroom. Remove carpet from the bedroom. Encase the mattress and box springs with a zippered encasing.  Molds - Outdoor avoidance Avoid being outside when the grass is being mowed, or the ground is tilled. Avoid playing in leaves, pine straw, hay, etc.  Dead plant materials contain mold. Avoid going into barns or grain storage areas. Remove leaves, clippings and compost from around the home.  Pollen Avoidance Pollen levels are highest during the mid-day and afternoon.  Consider this when planning outdoor activities. Avoid being outside when the grass is being mowed, or wear a mask if the pollen-allergic person must be the one to mow the grass. Keep the windows closed to keep pollen outside of the home. Use an air conditioner to filter the air. Take a  shower, wash hair, and change clothing after working or playing outdoors during pollen season.

## 2022-09-23 ENCOUNTER — Ambulatory Visit: Payer: BC Managed Care – PPO | Admitting: Internal Medicine

## 2022-09-30 ENCOUNTER — Other Ambulatory Visit: Payer: Self-pay

## 2022-09-30 ENCOUNTER — Ambulatory Visit (INDEPENDENT_AMBULATORY_CARE_PROVIDER_SITE_OTHER): Payer: BC Managed Care – PPO | Admitting: Internal Medicine

## 2022-09-30 ENCOUNTER — Encounter: Payer: Self-pay | Admitting: Internal Medicine

## 2022-09-30 VITALS — BP 126/82 | HR 94 | Temp 97.7°F | Resp 18 | Ht 70.0 in | Wt 219.8 lb

## 2022-09-30 DIAGNOSIS — L2084 Intrinsic (allergic) eczema: Secondary | ICD-10-CM

## 2022-09-30 DIAGNOSIS — J302 Other seasonal allergic rhinitis: Secondary | ICD-10-CM

## 2022-09-30 DIAGNOSIS — J3089 Other allergic rhinitis: Secondary | ICD-10-CM

## 2022-09-30 DIAGNOSIS — L501 Idiopathic urticaria: Secondary | ICD-10-CM | POA: Diagnosis not present

## 2022-09-30 MED ORDER — TRIAMCINOLONE ACETONIDE 0.1 % EX OINT: TOPICAL_OINTMENT | CUTANEOUS | 5 refills | Status: DC

## 2022-09-30 MED ORDER — CETIRIZINE HCL 10 MG PO TABS: 10.0000 mg | ORAL_TABLET | Freq: Two times a day (BID) | ORAL | 5 refills | Status: DC | PRN

## 2022-09-30 MED ORDER — FLUTICASONE PROPIONATE 50 MCG/ACT NA SUSP: 2.0000 | Freq: Every day | NASAL | 5 refills | Status: DC

## 2022-09-30 MED ORDER — AZELASTINE HCL 0.1 % NA SOLN: 1.0000 | Freq: Two times a day (BID) | NASAL | 5 refills | Status: DC | PRN

## 2022-09-30 MED ORDER — HYDROCORTISONE 2.5 % EX CREA: TOPICAL_CREAM | Freq: Two times a day (BID) | CUTANEOUS | 5 refills | Status: DC

## 2022-09-30 NOTE — Progress Notes (Signed)
FOLLOW UP Date of Service/Encounter:  09/30/22   Subjective:  [name] (DOB: 1970-02-24) is a 52 y.o. male [facility] for follow up for allergic rhinitis, eczema and idiopathic urticaria.   History obtained from: chart review and patient. Last visit was with me on 08/10/2022 and at the time was SPT positive to pollen/molds.  Started on Flonase, Azelastine. Also on Zyrtec, Pepcid PRN, topical steroids PRN for hives/eczema.   Rhinoconjunctivitis: Doing better since last visit.  Not as much congestion, drainage, runny nose.  Taking Flonase daily, Zyrtec PRN. Has not tried Azelastine.   Hives: Did have a rash outbreak about a week ago.  It was itchy. Lasted only a day.  Resolved with benadryl and zyrtec.  Thinks wife took pictures but he can't find them.   Eczema: Doing well overall. No recent outbreaks. Does moisturize regularly.  Has topical steroids but infrequent use.    Past Medical History: Past Medical History:  Diagnosis Date   Adrenal abnormality (HCC)    Allergy    Anxiety    Arthritis    Atypical chest pain    BPH (benign prostatic hypertrophy)    Complication of anesthesia    Hard time waking up after neck surgery   Depression    GERD (gastroesophageal reflux disease)    "not since Gastric Bypass"   Gout    H/O: CVA (cardiovascular accident)    Hyperlipidemia    Knee pain, bilateral    LBP (low back pain)    Neuromuscular disorder (HCC)    OSA (obstructive sleep apnea)    Plantar fasciitis    PTSD (post-traumatic stress disorder)    Seizure disorder (HCC)    Sleep apnea    Stroke (HCC) 2001   some memory loss- math , science     Objective:  BP 126/82 (BP Location: Right Arm, Patient Position: Sitting, Cuff Size: Normal)   Pulse 94   Temp 97.7 F (36.5 C) (Temporal)   Resp 18   Ht 5\' 10"  (1.778 m)   Wt 219 lb 12.8 oz (99.7 kg)   SpO2 98%   BMI 31.54 kg/m  Body mass index is 31.54  kg/m. Physical Exam: GEN: alert, well developed HEENT: clear conjunctiva, TM grey and translucent, nose with mild inferior turbinate hypertrophy, pink nasal mucosa, no rhinorrhea, no cobblestoning HEART: regular rate and rhythm, no murmur LUNGS: clear to auscultation bilaterally, no coughing, unlabored respiration SKIN: no rashes or lesions   Assessment:   1. Seasonal and perennial allergic rhinitis   2. Idiopathic urticaria   3. Intrinsic atopic dermatitis     Plan/Recommendations:  Allergic Rhinitis:  - Controlled with combination of Flonase/Zyrtec.  - Positive skin test 08/2022: trees, grasses, molds - Avoidance measures discussed. - Use nasal saline rinses before nose sprays such as with Neilmed Sinus Rinse.  Use distilled water.   - Use Flonase 2 sprays each nostril daily. Aim upward and outward. - Use Azelastine 1-2 sprays each nostril twice daily as needed for congestion, drainage, sneezing, runny nose. Aim upward and outward. - Use Zyrtec 10 mg daily.  - Consider allergy shots as long term control of your symptoms by teaching your immune system to be more tolerant of your allergy triggers  Idiopathic Urticaria (Hives): - Overall controlled but did have an episode that resolved with anti histamines, consistent with hives.   - Please take pictures and videos next time you have the rash.    -  At this time etiology of hives and swelling is unknown. Hives can be caused by a variety of different triggers including illness/infection, exercise, pressure, vibrations, extremes of temperature to name a few however majority of the time there is no identifiable trigger.  -Continue Zyrtec 10mg  daily.   -If hives still reoccur, increase to Zyrtec 10mg  twice daily.   -If hives still reoccur, add Pepcid 20mg  twice daily and continue Zyrtec 10mg  twice daily.  Eczema: - Controlled  - Do a daily soaking tub bath in warm water for 10-15 minutes.  - Use a gentle, unscented cleanser at the end  of the bath (such as Dove unscented bar or baby wash, or Aveeno sensitive body wash). Then rinse, pat half-way dry, and apply a gentle, unscented moisturizer cream or ointment (Cerave, Cetaphil, Eucerin, Aveeno)  all over while still damp. Dry skin makes the itching and rash of eczema worse. The skin should be moisturized with a gentle, unscented moisturizer at least twice daily.  - Use only unscented liquid laundry detergent. - Apply prescribed topical steroid (triamcinolone 0.1% below neck or hydrocortisone 2.5% above neck) to flared areas (red and thickened eczema) after the moisturizer has soaked into the skin (wait at least 30 minutes). Taper off the topical steroids as the skin improves. Do not use topical steroid for more than 7-10 days at a time.      Return in about 4 months (around 01/30/2023).  Alesia Morin, MD [facility]

## 2022-09-30 NOTE — Patient Instructions (Addendum)
Allergic Rhinitis:  - Positive skin test 08/2022: trees, grasses, molds - Avoidance measures discussed. - Use nasal saline rinses before nose sprays such as with Neilmed Sinus Rinse.  Use distilled water.   - Use Flonase 2 sprays each nostril daily. Aim upward and outward. - Use Azelastine 1-2 sprays each nostril twice daily as needed for congestion, drainage, sneezing, runny nose. Aim upward and outward. - Use Zyrtec 10 mg daily.  - Consider allergy shots as long term control of your symptoms by teaching your immune system to be more tolerant of your allergy triggers  Idiopathic Urticaria (Hives): - Please take pictures and videos next time you have the rash.    - At this time etiology of hives and swelling is unknown. Hives can be caused by a variety of different triggers including illness/infection, exercise, pressure, vibrations, extremes of temperature to name a few however majority of the time there is no identifiable trigger.  -Continue Zyrtec 10mg  daily.   -If hives still reoccur, increase to Zyrtec 10mg  twice daily.   -If hives still reoccur, add Pepcid 20mg  twice daily and continue Zyrtec 10mg  twice daily.  Eczema: - Do a daily soaking tub bath in warm water for 10-15 minutes.  - Use a gentle, unscented cleanser at the end of the bath (such as Dove unscented bar or baby wash, or Aveeno sensitive body wash). Then rinse, pat half-way dry, and apply a gentle, unscented moisturizer cream or ointment (Cerave, Cetaphil, Eucerin, Aveeno)  all over while still damp. Dry skin makes the itching and rash of eczema worse. The skin should be moisturized with a gentle, unscented moisturizer at least twice daily.  - Use only unscented liquid laundry detergent. - Apply prescribed topical steroid (triamcinolone 0.1% below neck or hydrocortisone 2.5% above neck) to flared areas (red and thickened eczema) after the moisturizer has soaked into the skin (wait at least 30 minutes). Taper off the topical  steroids as the skin improves. Do not use topical steroid for more than 7-10 days at a time.

## 2022-12-23 ENCOUNTER — Encounter: Payer: Self-pay | Admitting: Family Medicine

## 2022-12-23 ENCOUNTER — Ambulatory Visit (INDEPENDENT_AMBULATORY_CARE_PROVIDER_SITE_OTHER): Payer: BC Managed Care – PPO | Admitting: Family Medicine

## 2022-12-23 VITALS — BP 107/72 | HR 89 | Temp 98.1°F | Ht 70.0 in | Wt 218.8 lb

## 2022-12-23 DIAGNOSIS — E1169 Type 2 diabetes mellitus with other specified complication: Secondary | ICD-10-CM

## 2022-12-23 DIAGNOSIS — F339 Major depressive disorder, recurrent, unspecified: Secondary | ICD-10-CM

## 2022-12-23 DIAGNOSIS — E785 Hyperlipidemia, unspecified: Secondary | ICD-10-CM

## 2022-12-23 DIAGNOSIS — E1142 Type 2 diabetes mellitus with diabetic polyneuropathy: Secondary | ICD-10-CM | POA: Diagnosis not present

## 2022-12-23 DIAGNOSIS — J301 Allergic rhinitis due to pollen: Secondary | ICD-10-CM

## 2022-12-23 DIAGNOSIS — L309 Dermatitis, unspecified: Secondary | ICD-10-CM | POA: Insufficient documentation

## 2022-12-23 DIAGNOSIS — E669 Obesity, unspecified: Secondary | ICD-10-CM | POA: Diagnosis not present

## 2022-12-23 DIAGNOSIS — G894 Chronic pain syndrome: Secondary | ICD-10-CM

## 2022-12-23 DIAGNOSIS — M722 Plantar fascial fibromatosis: Secondary | ICD-10-CM

## 2022-12-23 DIAGNOSIS — L501 Idiopathic urticaria: Secondary | ICD-10-CM | POA: Insufficient documentation

## 2022-12-23 LAB — POCT GLYCOSYLATED HEMOGLOBIN (HGB A1C): Hemoglobin A1C: 6.1 % — AB (ref 4.0–5.6)

## 2022-12-23 NOTE — Progress Notes (Signed)
Subjective  CC:  Chief Complaint  Patient presents with   Diabetes    HPI: [name] is a 52 y.o. male who presents to the office today for follow up of diabetes and problems listed above in the chief complaint.  Diabetes follow up: His diabetic control is reported as Unchanged. Diet controlled. Exercises. He denies exertional CP or SOB or symptomatic hypoglycemia. He denies foot sores or paresthesias.  [facility]. All are controlled on meds however mood is down a bit due to several deaths in family. He feels he is coping The patient has been experiencing mood disturbances, which he attributes to recent bereavements in his family and community. He is currently seeing a psychiatrist and a therapist for management of his mood symptoms.  He also reports chronic pain, which is managed by the [place]. He has been participating in a pain management program, which includes physical therapy and psychological support. He expresses a desire to reduce his reliance on pain medication.  The patient has plantar fasciitis in both feet, which has been causing him significant discomfort. He has had two surgeries and is currently under the care of an orthopedic specialist at the [place]. He also reports trigger finger symptoms, for which he has received two injections.  The patient's cholesterol levels are being monitored, with a plan to reassess in May. He is currently on a stable dose of cholesterol medication. He also mentioned a discussion with his podiatrist about a compounded anti-inflammatory cream for his foot pain. Wt Readings from Last 3 Encounters:  12/23/22 218 lb 12.8 oz (99.2 kg)  09/30/22 219 lb 12.8 oz (99.7 kg)  08/10/22 224 lb 8 oz (101.8 kg)    BP Readings from Last 3 Encounters:  12/23/22 107/72  09/30/22 126/82  08/10/22 108/80    Assessment  1. Type 2 diabetes mellitus with diabetic polyneuropathy, without long-term current use of  insulin (HCC)   2. Hyperlipidemia associated with type 2 diabetes mellitus (HCC), on statin   3. Major depression, recurrent, chronic (HCC)   4. Obesity (BMI 30-39.9)   5. Idiopathic urticaria   6. Seasonal allergic rhinitis due to pollen   7. Eczema, unspecified type   8. Chronic pain syndrome   9. Plantar fasciitis      Plan  Diabetes is currently very well controlled. Diet controlled. Imms current. No retinopathy or complications. Normotensive w/o microalbuminuria not on ace HLD good control on lipitor 20. Recheck 6 mo. Keep to low fat diet Chronic pain control [facility]. Rec follow-up with Ortho for persistent plantar fasciitis and Voltaren topical gel.  Also sees podiatry Reviewed all notes from allergist.  Current allergy regimen is working.  He is improved Depression: Chronic with mild exacerbation due to bereavement.  Counseling, continue with therapy and medications.  No new interventions indicated at this time  Follow up: 6 months for complete physical and follow-up Orders Placed This Encounter  Procedures   POCT HgB A1C   No orders of the defined types were placed in this encounter.     Immunization History  Administered Date(s) Administered   Influenza Split 09/13/2011   Influenza Whole 11/18/2008   Influenza,inj,Quad PF,6+ Mos 11/15/2012, 11/12/2013, 09/26/2016, 11/21/2017, 11/19/2018, 12/11/2019, 10/08/2020, 02/19/2021   Moderna Covid-19 Fall Seasonal Vaccine 18yrs & older 12/21/2022   Moderna Covid-19 Vaccine Bivalent Booster 72yrs & up 10/22/2020   PFIZER(Purple Top)SARS-COV-2 Vaccination 04/14/2019, 05/12/2019, 01/05/2020, 05/23/2020   PNEUMOCOCCAL  CONJUGATE-20 10/22/2020   Pfizer(Comirnaty)Fall Seasonal Vaccine 12 years and older 11/11/2021   Pneumococcal Polysaccharide-23 12/22/2010   Td 03/16/2009, 02/19/2021   Tdap 02/17/2011, 02/19/2020   Zoster Recombinant(Shingrix) 07/05/2019, 01/10/2020    Diabetes Related Lab Review: Lab Results   Component Value Date   HGBA1C 6.1 (A) 12/23/2022   HGBA1C 6.7 (H) 06/22/2022   HGBA1C 6.4 03/11/2021    Lab Results  Component Value Date   MICROALBUR 1.2 06/22/2022   Lab Results  Component Value Date   CREATININE 0.92 06/22/2022   BUN 8 06/22/2022   NA 140 06/22/2022   K 4.1 06/22/2022   CL 102 06/22/2022   CO2 32 06/22/2022   Lab Results  Component Value Date   CHOL 155 06/22/2022   CHOL 135 03/11/2021   CHOL 153 06/03/2020   Lab Results  Component Value Date   HDL 62.40 06/22/2022   HDL 62.90 03/11/2021   HDL 62.00 06/03/2020   Lab Results  Component Value Date   LDLCALC 71 06/22/2022   LDLCALC 60 03/11/2021   LDLCALC 79 06/03/2020   Lab Results  Component Value Date   TRIG 108.0 06/22/2022   TRIG 61.0 03/11/2021   TRIG 61.0 06/03/2020   Lab Results  Component Value Date   CHOLHDL 2 06/22/2022   CHOLHDL 2 03/11/2021   CHOLHDL 2 06/03/2020   Lab Results  Component Value Date   LDLDIRECT 148.1 11/04/2010   LDLDIRECT 207.7 02/08/2007   The ASCVD Risk score (Arnett DK, et al., 2019) failed to calculate for the following reasons:   The patient has a prior MI or stroke diagnosis I have reviewed the PMH, Fam and Soc history. Patient Active Problem List   Diagnosis Date Noted Date Diagnosed   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 12/03/2019     Priority: High   Chronic post-traumatic stress disorder (PTSD) after military combat 06/03/2017     Priority: High   Major depression, recurrent, chronic (HCC) 01/01/2017     Priority: High   Chronic pain syndrome 09/03/2016     Priority: High   Obesity (BMI 30-39.9) 03/15/2013     Priority: High    The patient is asked to make an attempt to improve diet and exercise patterns to aid in medical management of this problem.     Type 2 diabetes mellitus with diabetic polyneuropathy, without long-term current use of insulin (HCC) 12/22/2010     Priority: High    dxd in 2009 Diet controlled. Normotensive. Not on  ace 2024    Hyperlipidemia associated with type 2 diabetes mellitus (HCC), on statin 08/23/2006     Priority: High   Obstructive sleep apnea 08/23/2006     Priority: High    Sleep study 08/2000 - mild sleep apnea now; but prefers to continue cpap, dr. Vassie Loll  NPSG 2006:  AHI 78/hr. Auto optimized to 13-14 cm.  Autoset 01/2011 to 13 cm.    Chronic low back pain 08/23/2006     Priority: High    Using back brace.  Using as needed Percocet.     Seizure disorder (HCC) 08/23/2006     Priority: High   History of CVA (cerebrovascular accident) 08/23/2006     Priority: High   Idiopathic urticaria 12/23/2022     Priority: Medium    Insomnia 07/15/2015     Priority: Medium    Cervical spondylosis without myelopathy 02/06/2015     Priority: Medium    BPH (benign prostatic hyperplasia) 11/15/2012  Priority: Medium    Chronic pain of both knees 12/08/2008     Priority: Medium     Severe. Daily pain 8/10, down to 7/10 with antiinflammatory. Interfering with life. Limited exercise due to pain.       Gout 08/23/2006     Priority: Medium    Eczema 12/23/2022     Priority: Low   Vitamin B 12 deficiency 12/09/2017     Priority: Low   Allergic rhinitis 08/23/2006     Priority: Low    Sees allergies, Dr. Allena Katz 2024. + pollen allergies etc. No food allergies. Recommended meds, and allergy shots if needed    Degenerative spondylolisthesis 01/10/2022     Social History: Patient  reports that he quit smoking about 32 years ago. His smoking use included cigarettes. He started smoking about 36 years ago. He has never been exposed to tobacco smoke. He has never used smokeless tobacco. He reports that he does not drink alcohol and does not use drugs.  Review of Systems: Ophthalmic: negative for eye pain, loss of vision or double vision Cardiovascular: negative for chest pain Respiratory: negative for SOB or persistent cough Gastrointestinal: negative for abdominal pain Genitourinary:  negative for dysuria or gross hematuria MSK: negative for foot lesions Neurologic: negative for weakness or gait disturbance  Objective  Vitals: BP 107/72 Comment: rechecked by cla  Pulse 89   Temp 98.1 F (36.7 C)   Ht 5\' 10"  (1.778 m)   Wt 218 lb 12.8 oz (99.2 kg)   SpO2 97%   BMI 31.39 kg/m  General: well appearing, no acute distress  Psych:  Alert and oriented, normal mood and affect HEENT:  Normocephalic, atraumatic, moist mucous membranes, supple neck  Cardiovascular:  Nl S1 and S2, RRR without murmur, gallop or rub. no edema Respiratory:  Good breath sounds bilaterally, CTAB with normal effort, no rales  Diabetic education: ongoing education regarding chronic disease management for diabetes was given today. We continue to reinforce the ABC's of diabetic management: A1c (<7 or 8 dependent upon patient), tight blood pressure control, and cholesterol management with goal LDL < 100 minimally. We discuss diet strategies, exercise recommendations, medication options and possible side effects. At each visit, we review recommended immunizations and preventive care recommendations for diabetics and stress that good diabetic control can prevent other problems. See below for this patient's data.   Commons side effects, risks, benefits, and alternatives for medications and treatment plan prescribed today were discussed, and the patient expressed understanding of the given instructions. Patient is instructed to call or message via MyChart if he/she has any questions or concerns regarding our treatment plan. No barriers to understanding were identified. We discussed Red Flag symptoms and signs in detail. Patient expressed understanding regarding what to do in case of urgent or emergency type symptoms.  Medication list was reconciled, printed and provided to the patient in AVS. Patient instructions and summary information was reviewed with the patient as documented in the AVS. This note was prepared  with assistance of Dragon voice recognition software. Occasional wrong-word or sound-a-like substitutions may have occurred due to the inherent limitations of voice recognition software

## 2022-12-23 NOTE — Patient Instructions (Signed)
Please return in 6 months for your annual complete physical; please come fasting.    If you have any questions or concerns, please don't hesitate to send me a message via MyChart or call the office at [phone]. Thank you for visiting with [name] today! It's our pleasure caring for you.   VISIT SUMMARY:  During your visit, we reviewed your current health status, including your diabetes, hypertension, mood, chronic pain, and plantar fasciitis. Your diabetes management is showing positive results with a recent improvement in HbA1c levels. We discussed your blood pressure readings, which have been variable, and your ongoing mood disturbances due to recent bereavements. We also reviewed your chronic pain management and plantar fasciitis treatment. Additionally, we touched on your general health maintenance, including recent vaccinations.  YOUR PLAN:  -DIABETES MELLITUS TYPE 2: Diabetes Mellitus Type 2 is a condition where your body does not use insulin properly, leading to high blood sugar levels. Your diabetes is well-controlled with an A1c of 6.1%, down from 6.6%. Continue with your current diabetes management plan and monitor your A1c levels regularly.  -HYPERTENSION: Hypertension is high blood pressure, which can lead to serious health issues if not managed. Your blood pressure readings have been variable, with lower readings at the [place] and higher readings in our clinic, possibly due to anxiety. Continue to monitor your blood pressure regularly. There is no need for ACE inhibitors at this time as your blood pressure is generally well-controlled.  -DEPRESSION: Depression is a mood disorder that causes persistent feelings of sadness and loss of interest. Your mood has been affected by recent family deaths and trauma. Continue with your therapy and psychiatric care to manage your symptoms.  -CHRONIC PAIN: Chronic pain is long-standing pain that persists beyond the usual recovery period. [facility], and you are participating in a pain management program that includes physical therapy and psychological support. Continue with the program and focus on behavioral and emotional strategies to manage your pain.  -PLANTAR FASCIITIS: Plantar fasciitis is inflammation of the tissue on the bottom of your foot, causing pain. You have ongoing issues in your right foot post-surgery and are using over-the-counter Voltaren gel. Continue using the gel, consult with orthopedics for further management, and discuss the compounded cream with your pharmacy.  -GENERAL HEALTH MAINTENANCE: General health maintenance includes routine check-ups and vaccinations to keep you healthy. You have received your flu and COVID-19 vaccinations recently. Schedule a physical in May.  INSTRUCTIONS:  Please schedule a physical in May. Call [name] if any issues arise.

## 2023-01-18 ENCOUNTER — Ambulatory Visit: Payer: BC Managed Care – PPO | Admitting: Internal Medicine

## 2023-01-18 ENCOUNTER — Other Ambulatory Visit: Payer: Self-pay

## 2023-01-18 ENCOUNTER — Encounter: Payer: Self-pay | Admitting: Internal Medicine

## 2023-01-18 VITALS — BP 124/88 | HR 86 | Temp 97.8°F | Resp 14 | Ht 68.7 in | Wt 217.6 lb

## 2023-01-18 DIAGNOSIS — L501 Idiopathic urticaria: Secondary | ICD-10-CM

## 2023-01-18 DIAGNOSIS — L2084 Intrinsic (allergic) eczema: Secondary | ICD-10-CM

## 2023-01-18 DIAGNOSIS — J302 Other seasonal allergic rhinitis: Secondary | ICD-10-CM

## 2023-01-18 DIAGNOSIS — J3089 Other allergic rhinitis: Secondary | ICD-10-CM

## 2023-01-18 MED ORDER — CETIRIZINE HCL 10 MG PO TABS: 10.0000 mg | ORAL_TABLET | Freq: Two times a day (BID) | ORAL | 5 refills | Status: DC | PRN

## 2023-01-18 MED ORDER — AZELASTINE HCL 0.1 % NA SOLN: 1.0000 | Freq: Two times a day (BID) | NASAL | 5 refills | Status: DC | PRN

## 2023-01-18 MED ORDER — TRIAMCINOLONE ACETONIDE 0.1 % EX OINT: TOPICAL_OINTMENT | CUTANEOUS | 5 refills | Status: DC

## 2023-01-18 MED ORDER — FLUTICASONE PROPIONATE 50 MCG/ACT NA SUSP: 2.0000 | Freq: Every day | NASAL | 5 refills | Status: DC

## 2023-01-18 NOTE — Patient Instructions (Addendum)
Allergic Rhinitis:  - Positive skin test 08/2022: trees, grasses, molds - Avoidance measures discussed. - Use nasal saline rinses before nose sprays such as with Neilmed Sinus Rinse.  Use distilled water.   - Use Flonase 2 sprays each nostril daily. Aim upward and outward. - Use Azelastine 1-2 sprays each nostril twice daily as needed for congestion, drainage, sneezing, runny nose. Aim upward and outward. - Use Zyrtec 10 mg daily.  - Consider allergy shots as long term control of your symptoms by teaching your immune system to be more tolerant of your allergy triggers.   Idiopathic Urticaria (Hives): - At this time etiology of hives and swelling is unknown. Hives can be caused by a variety of different triggers including illness/infection, exercise, pressure, vibrations, extremes of temperature to name a few however majority of the time there is no identifiable trigger.  -Continue Zyrtec 10mg  daily.   -If hives still reoccur, increase to Zyrtec 10mg  twice daily.    Eczema: - Do a daily soaking tub bath in warm water for 10-15 minutes.  - Use a gentle, unscented cleanser at the end of the bath (such as Dove unscented bar or baby wash, or Aveeno sensitive body wash). Then rinse, pat half-way dry, and apply a gentle, unscented moisturizer cream or ointment (Cerave, Cetaphil, Eucerin, Aveeno)  all over while still damp. Dry skin makes the itching and rash of eczema worse. The skin should be moisturized with a gentle, unscented moisturizer at least twice daily.  - Use only unscented liquid laundry detergent. - Apply prescribed topical steroid (triamcinolone 0.1% below neck or hydrocortisone 2.5% above neck) to flared areas (red and thickened eczema) after the moisturizer has soaked into the skin (wait at least 30 minutes). Taper off the topical steroids as the skin improves. Do not use topical steroid for more than 7-10 days at a time.

## 2023-01-18 NOTE — Progress Notes (Signed)
FOLLOW UP Date of Service/Encounter:  01/18/23   Subjective:  [name] (DOB: 10/06/1970) is a 52 y.o. male who [facility] for follow up for allergic rhinitis, eczema and idiopathic urticaria   History obtained from: chart review and patient. Last visit was on 09/30/2022 with me and at the time was doing better on Flonase, Zyrtec and on topical steroids for eczema.  Also hive like rash that resolved with Zyrtec/Benadryl.    Reports an outbreak around upper chest this AM.  It is red, itchy, slightly dry appearing.  Does moisturize regularly. Has not tried anything for it yet. Outside of this, his eczema flares up about 1-2x/month requiring topical steroids for just a few days. Does note flare up in rashes with acidic foods like tomato sauce.   No hive outbreaks.  Allergies are doing fine.  Using Flonase/Zyrtec PRN without much congestion/drainage/runny nose.  Past Medical History: Past Medical History:  Diagnosis Date   Adrenal abnormality (HCC)    Allergy    Anxiety    Arthritis    Atypical chest pain    BPH (benign prostatic hypertrophy)    Complication of anesthesia    Hard time waking up after neck surgery   Depression    GERD (gastroesophageal reflux disease)    "not since Gastric Bypass"   Gout    H/O: CVA (cardiovascular accident)    Hyperlipidemia    Knee pain, bilateral    LBP (low back pain)    Neuromuscular disorder (HCC)    OSA (obstructive sleep apnea)    Plantar fasciitis    PTSD (post-traumatic stress disorder)    Seizure disorder (HCC)    Sleep apnea    Stroke (HCC) 2001   some memory loss- math , science     Objective:  BP 124/88 (BP Location: Right Arm, Patient Position: Sitting, Cuff Size: Normal)   Pulse 86   Temp 97.8 F (36.6 C) (Temporal)   Resp 14   Ht 5' 8.7" (1.745 m)   Wt 217 lb 9.6 oz (98.7 kg)   SpO2 98%   BMI 32.41 kg/m  Body mass index is 32.41 kg/m. Physical Exam: GEN:  alert, well developed HEENT: clear conjunctiva, nose with mild inferior turbinate hypertrophy, pink nasal mucosa, no rhinorrhea, no cobblestoning HEART: regular rate and rhythm, no murmur LUNGS: clear to auscultation bilaterally, no coughing, unlabored respiration SKIN: small area over L chest near armpit     Assessment:   1. Seasonal and perennial allergic rhinitis   2. Idiopathic urticaria   3. Intrinsic atopic dermatitis     Plan/Recommendations:  Allergic Rhinitis:  - Controlled.  - Positive skin test 08/2022: trees, grasses, molds - Avoidance measures discussed. - Use nasal saline rinses before nose sprays such as with Neilmed Sinus Rinse.  Use distilled water.   - Use Flonase 2 sprays each nostril daily. Aim upward and outward. - Use Azelastine 1-2 sprays each nostril twice daily as needed for congestion, drainage, sneezing, runny nose. Aim upward and outward. - Use Zyrtec 10 mg daily.  - Consider allergy shots as long term control of your symptoms by teaching your immune system to be more tolerant of your allergy triggers.   Idiopathic Urticaria (Hives): - Controlled  - At this time etiology of hives and swelling is unknown. Hives can be caused by a variety of different triggers including illness/infection, exercise, pressure, vibrations, extremes of temperature to name a few however majority of the  time there is no identifiable trigger.  -Continue Zyrtec 10mg  daily.   -If hives still reoccur, increase to Zyrtec 10mg  twice daily.    Eczema: - Controlled on PRN topical steroids  - Currently rash likely eczematous.  Acidic foods can also sometimes flare up eczema/rashes but this it not a true food allergy.  - Do a daily soaking tub bath in warm water for 10-15 minutes.  - Use a gentle, unscented cleanser at the end of the bath (such as Dove unscented bar or baby wash, or Aveeno sensitive body wash). Then rinse, pat half-way dry, and apply a gentle, unscented moisturizer cream  or ointment (Cerave, Cetaphil, Eucerin, Aveeno)  all over while still damp. Dry skin makes the itching and rash of eczema worse. The skin should be moisturized with a gentle, unscented moisturizer at least twice daily.  - Use only unscented liquid laundry detergent. - Apply prescribed topical steroid (triamcinolone 0.1% below neck or hydrocortisone 2.5% above neck) to flared areas (red and thickened eczema) after the moisturizer has soaked into the skin (wait at least 30 minutes). Taper off the topical steroids as the skin improves. Do not use topical steroid for more than 7-10 days at a time.     Return in about 6 months (around 07/19/2023).  Alesia Morin, MD [facility]

## 2023-05-05 ENCOUNTER — Other Ambulatory Visit: Payer: Self-pay | Admitting: Internal Medicine

## 2023-06-01 LAB — MICROALBUMIN / CREATININE URINE RATIO: Microalb Creat Ratio: 0.4

## 2023-06-21 LAB — COMPREHENSIVE METABOLIC PANEL WITH GFR: eGFR: 103

## 2023-06-21 LAB — BASIC METABOLIC PANEL WITH GFR: Creatinine: 0.9 (ref 0.6–1.3)

## 2023-06-28 ENCOUNTER — Ambulatory Visit: Payer: No Typology Code available for payment source | Admitting: Family Medicine

## 2023-06-28 ENCOUNTER — Encounter: Payer: Self-pay | Admitting: Family Medicine

## 2023-06-28 VITALS — BP 124/82 | HR 91 | Temp 97.8°F | Ht 63.0 in | Wt 229.0 lb

## 2023-06-28 DIAGNOSIS — E1169 Type 2 diabetes mellitus with other specified complication: Secondary | ICD-10-CM | POA: Diagnosis not present

## 2023-06-28 DIAGNOSIS — Z1211 Encounter for screening for malignant neoplasm of colon: Secondary | ICD-10-CM | POA: Diagnosis not present

## 2023-06-28 DIAGNOSIS — Z8673 Personal history of transient ischemic attack (TIA), and cerebral infarction without residual deficits: Secondary | ICD-10-CM

## 2023-06-28 DIAGNOSIS — M5442 Lumbago with sciatica, left side: Secondary | ICD-10-CM

## 2023-06-28 DIAGNOSIS — Z0001 Encounter for general adult medical examination with abnormal findings: Secondary | ICD-10-CM

## 2023-06-28 DIAGNOSIS — E1142 Type 2 diabetes mellitus with diabetic polyneuropathy: Secondary | ICD-10-CM | POA: Diagnosis not present

## 2023-06-28 DIAGNOSIS — G894 Chronic pain syndrome: Secondary | ICD-10-CM

## 2023-06-28 DIAGNOSIS — D509 Iron deficiency anemia, unspecified: Secondary | ICD-10-CM | POA: Diagnosis not present

## 2023-06-28 DIAGNOSIS — Z1212 Encounter for screening for malignant neoplasm of rectum: Secondary | ICD-10-CM

## 2023-06-28 DIAGNOSIS — M5441 Lumbago with sciatica, right side: Secondary | ICD-10-CM | POA: Diagnosis not present

## 2023-06-28 DIAGNOSIS — G40909 Epilepsy, unspecified, not intractable, without status epilepticus: Secondary | ICD-10-CM

## 2023-06-28 DIAGNOSIS — F339 Major depressive disorder, recurrent, unspecified: Secondary | ICD-10-CM

## 2023-06-28 DIAGNOSIS — G8929 Other chronic pain: Secondary | ICD-10-CM

## 2023-06-28 DIAGNOSIS — G4733 Obstructive sleep apnea (adult) (pediatric): Secondary | ICD-10-CM

## 2023-06-28 LAB — CBC WITH DIFFERENTIAL/PLATELET
Basophils Absolute: 0 10*3/uL (ref 0.0–0.1)
Basophils Relative: 0.7 % (ref 0.0–3.0)
Eosinophils Absolute: 0 10*3/uL (ref 0.0–0.7)
Eosinophils Relative: 0.6 % (ref 0.0–5.0)
HCT: 42.2 % (ref 39.0–52.0)
Hemoglobin: 13.7 g/dL (ref 13.0–17.0)
Lymphocytes Relative: 32.2 % (ref 12.0–46.0)
Lymphs Abs: 1.4 10*3/uL (ref 0.7–4.0)
MCHC: 32.5 g/dL (ref 30.0–36.0)
MCV: 75.6 fl — ABNORMAL LOW (ref 78.0–100.0)
Monocytes Absolute: 0.3 10*3/uL (ref 0.1–1.0)
Monocytes Relative: 6.8 % (ref 3.0–12.0)
Neutro Abs: 2.6 10*3/uL (ref 1.4–7.7)
Neutrophils Relative %: 59.7 % (ref 43.0–77.0)
Platelets: 213 10*3/uL (ref 150.0–400.0)
RBC: 5.58 Mil/uL (ref 4.22–5.81)
RDW: 16.5 % — ABNORMAL HIGH (ref 11.5–15.5)
WBC: 4.4 10*3/uL (ref 4.0–10.5)

## 2023-06-28 LAB — HEMOGLOBIN A1C: Hgb A1c MFr Bld: 6.7 % — ABNORMAL HIGH (ref 4.6–6.5)

## 2023-06-28 NOTE — Progress Notes (Signed)
 Subjective  Chief Complaint  Patient presents with   Annual Exam   Diabetes    HPI: [name] is a 53 y.o. male who presents to [facility] [place] today for a Male Wellness Visit. He also has the concerns and/or needs as listed above in the chief complaint. These will be addressed in addition to the Health Maintenance Visit.   Wellness Visit: annual visit with health maintenance review and exam   HM: Patient reports normal eye exam last month.  At the [place].  He will get records.  Colonoscopy was normal except for diverticulosis in 2022.  Repeat in 10 years.  He does light exercise with resistance bands.  Feels well overall.  Immunizations are current.  Body mass index is 40.57 kg/m. Wt Readings from Last 3 Encounters:  06/28/23 229 lb (103.9 kg)  01/18/23 217 lb 9.6 oz (98.7 kg)  12/23/22 218 lb 12.8 oz (99.2 kg)   Chronic disease management visit and/or acute problem visit: Discussed the use of AI scribe software for clinical note transcription with the patient, who gave verbal consent to proceed.  History of Present Illness [name] is a 53 year old male with type two diabetes and hyperlipidemia who presents for a complete physical and follow-up of chronic problems and recent ER visit for atypical chest pain.  He recently visited the ER for atypical chest pain, described as a sensation of pressure on his chest while driving, accompanied by nausea, diaphoresis, and pain radiating down his arm. He was administered baby aspirin  and nitroglycerin, which alleviated the pain. His wife observed disorientation, ptosis, and dysarthria during the episode.  His workup was thorough and negative.  Negative brain CT, EKG was without acute findings.  Cardiac labs were all negative.  He was discharged and referred to cardiology.  [facility] and has a [facility] follow-up appointment.  He has had no further episodes of chest pain  since.  His type two diabetes is diet-controlled, and he feels his blood sugars are stable. His recent A1c was 6.7. He is not on an ACE inhibitor and is normotensive.  I reviewed lab work from his recent physical at the [place].  His urine microalbuminuria ratio was 0.4, normal GFR.  No neuropathy symptoms currently.  No retinopathy.  Mild microcytic anemia: No significant GERD nor melena.  No abdominal pain.  Has history of macrocytosis without anemia in years past.  No known sickle cell trait.  At this has not been checked.  His hyperlipidemia is managed with Lipitor 20 mg, taken every other day, with an LDL level of 94.  His goal is less than 70.  He denies adverse effects.  He experiences chronic back pain, managed with massage therapy and chiropractic care. He has a history of surgery and physical therapy for this issue.  He has urinary symptoms, including hesitancy and a weak stream, attributed to a previously diagnosed mildly enlarged prostate. He has an upcoming appointment for further evaluation.  He has known BPH with recent normal PSA.  His major depression is reportedly improving, though his wife may have a different perspective. He is awaiting an appointment with a new mental health provider.  He takes mental health medications per psychiatry.  No recent chest pain, nausea, diaphoresis, or paresthesia in the legs and feet. No claudication.   Assessment  1. Encounter for well adult exam with abnormal findings   2. Type 2 diabetes mellitus with  diabetic polyneuropathy, without long-term current use of insulin (HCC)   3. Hyperlipidemia associated with type 2 diabetes mellitus (HCC), on statin   4. History of CVA (cerebrovascular accident)   5. Chronic pain syndrome   6. Chronic midline low back pain with bilateral sciatica   7. Major depression, recurrent, chronic (HCC)   8. Obstructive sleep apnea   9. Seizure disorder (HCC)   10. Microcytic anemia   11. Screening for colorectal  cancer      Plan  Male Wellness Visit: Age appropriate Health Maintenance and Prevention measures were discussed with patient. Included topics are cancer screening recommendations, ways to keep healthy (see AVS) including dietary and exercise recommendations, regular eye and dental care, use of seat belts, and avoidance of moderate alcohol use and tobacco use.  Screens are current BMI: discussed patient's BMI and encouraged positive lifestyle modifications to help get to or maintain a target BMI. HM needs and immunizations were addressed and ordered. See below for orders. See HM and immunization section for updates.  Up-to-date Routine labs and screening tests ordered including cmp, cbc and lipids where appropriate. Discussed recommendations regarding Vit D and calcium  supplementation (see AVS)  Chronic disease f/u and/or acute problem visit: (deemed necessary to be done in addition to the wellness visit): Assessment and Plan Assessment & Plan Atypical Chest Pain Recent episode with negative cardiac workup. Nitroglycerin provided relief. Scheduled for cardiac stress test and cardiology evaluation. - Take baby aspirin  daily. - Avoid significant exertion until cardiac evaluation is complete. - Complete scheduled cardiac stress test and follow up with cardiologist.  Type 2 Diabetes Mellitus Diet-controlled. Hemoglobin A1c at 6.7%. Minimal proteinuria, not concerning.  Not on ACE nor Farxiga  at this time.  Monitor annually - Continue diet control. - Monitor hemoglobin A1c and urine protein annually. -Neuropathy symptoms minimal and intermittent. - Follow up in 6 months.  Hyperlipidemia LDL cholesterol at 94 mg/dL, higher than desired. Current treatment with Lipitor not taken daily. - Increase Lipitor (atorvastatin) 20 mg to daily dosing. -Normal LFTs verified - Recheck cholesterol levels in 6 months.  Chronic Pain Persistent back and piriformis pain. Managed with massage therapy and  chiropractic care. - Continue massage therapy and chiropractic care.  Depression Improving from his perspective. Awaiting appointment with new provider. - Continue current mental health medications. - Follow up with new mental health provider when available.  Enlarged Prostate Symptoms of urinary hesitancy and weak stream. Awaiting further evaluation and imaging. - Consider starting Flomax (tamsulosin). - Complete scheduled imaging and follow up with urologist.  Microcytic anemia - No GI blood loss noted.  Check iron levels and repeat CBC.   Follow up: 6 months to follow-up diabetes and cholesterol Commons side effects, risks, benefits, and alternatives for medications and treatment plan prescribed today were discussed, and the patient expressed understanding of the given instructions. Patient is instructed to call or message via MyChart if he/she has any questions or concerns regarding our treatment plan. No barriers to understanding were identified. We discussed Red Flag symptoms and signs in detail. Patient expressed understanding regarding what to do in case of urgent or emergency type symptoms.  Medication list was reconciled, printed and provided to the patient in AVS. Patient instructions and summary information was reviewed with the patient as documented in the AVS. This note was prepared with assistance of Dragon voice recognition software. Occasional wrong-word or sound-a-like substitutions may have occurred due to the inherent limitations of voice recognition software  Orders Placed This Encounter  Procedures   Hemoglobin A1c   Iron, TIBC and Ferritin Panel   CBC with Differential/Platelet   Comprehensive metabolic panel with GFR   Basic metabolic panel with GFR   Microalbumin / creatinine urine ratio   No orders of the defined types were placed in this encounter.    Patient Active Problem List   Diagnosis Date Noted   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 12/03/2019    Chronic post-traumatic stress disorder (PTSD) after military combat 06/03/2017   Major depression, recurrent, chronic (HCC) 01/01/2017   Chronic pain syndrome 09/03/2016   Obesity (BMI 30-39.9) 03/15/2013   Type 2 diabetes mellitus with diabetic polyneuropathy, without long-term current use of insulin (HCC) 12/22/2010   Hyperlipidemia associated with type 2 diabetes mellitus (HCC), on statin 08/23/2006   Obstructive sleep apnea 08/23/2006   Chronic low back pain 08/23/2006   Seizure disorder (HCC) 08/23/2006   History of CVA (cerebrovascular accident) 08/23/2006   Idiopathic urticaria 12/23/2022   Insomnia 07/15/2015   Cervical spondylosis without myelopathy 02/06/2015   BPH (benign prostatic hyperplasia) 11/15/2012   Chronic pain of both knees 12/08/2008   Gout 08/23/2006   Eczema 12/23/2022   Vitamin B 12 deficiency 12/09/2017   Allergic rhinitis 08/23/2006   Screening for colorectal cancer 06/28/2023   Degenerative spondylolisthesis 01/10/2022   Health Maintenance  Topic Date Due   HEMOGLOBIN A1C  06/22/2023   INFLUENZA VACCINE  09/01/2023   Diabetic kidney evaluation - Urine ACR  05/31/2024   OPHTHALMOLOGY EXAM  05/31/2024   Diabetic kidney evaluation - eGFR measurement  06/20/2024   FOOT EXAM  06/27/2024   Colonoscopy  01/08/2031   DTaP/Tdap/Td (10 - Td or Tdap) 02/20/2031   Pneumococcal Vaccine 12-10 Years old  Completed   COVID-19 Vaccine  Completed   Hepatitis C Screening  Completed   HIV Screening  Completed   Zoster Vaccines- Shingrix  Completed   HPV VACCINES  Aged Out   Meningococcal B Vaccine  Aged Out   Immunization History  Administered Date(s) Administered   Dtap, Unspecified 08/22/1969, 10/24/1969, 11/21/1969, 02/05/1971, 09/04/1975   Influenza Inj Mdck Quad Pf 11/11/2021   Influenza Split 09/13/2011   Influenza Whole 11/18/2008   Influenza, Seasonal, Injecte, Preservative Fre 12/21/2022   Influenza,inj,Quad PF,6+ Mos 11/15/2012, 11/12/2013, 09/26/2016,  11/21/2017, 11/19/2018, 12/11/2019, 10/08/2020, 02/19/2021   Measles 08/12/1970   Moderna Covid-19 Fall Seasonal Vaccine 68yrs & older 12/21/2022   Moderna Covid-19 Vaccine Bivalent Booster 60yrs & up 10/22/2020   PFIZER(Purple Top)SARS-COV-2 Vaccination 04/14/2019, 05/12/2019, 01/05/2020, 05/23/2020   PNEUMOCOCCAL CONJUGATE-20 10/22/2020   Pfizer(Comirnaty)Fall Seasonal Vaccine 12 years and older 11/11/2021   Pneumococcal Polysaccharide-23 12/22/2010   Polio, Unspecified 08/22/1969, 10/24/1969, 01/15/1970, 02/05/1971, 09/04/1975   Rubella 08/12/1970   Td 03/16/2009, 02/19/2021   Tdap 02/17/2011, 02/19/2020   Zoster Recombinant(Shingrix) 07/05/2019, 01/10/2020   We updated and reviewed the patient's past history in detail and it is documented below. Allergies: Patient is allergic to atorvastatin, pregabalin , tizanidine, and cymbalta  [duloxetine  hcl]. Past Medical History  has a past medical history of Adrenal abnormality (HCC), Allergy , Anxiety, Arthritis, Atypical chest pain, BPH (benign prostatic hypertrophy), Complication of anesthesia, Depression, GERD (gastroesophageal reflux disease), Gout, H/O: CVA (cardiovascular accident), Hyperlipidemia, Knee pain, bilateral, LBP (low back pain), Neuromuscular disorder (HCC), OSA (obstructive sleep apnea), Plantar fasciitis, PTSD (post-traumatic stress disorder), Seizure disorder (HCC), Sleep apnea, and Stroke (HCC) (2001). Past Surgical History Patient  has a past surgical history that includes Trigger finger release (Right, 05/14/2015); Neck surgery; Shoulder surgery (Right);  Heel spur surgery (Left); Endoscopic plantar fasciotomy; Carpal tunnel release (Bilateral); Gastric Roux-En-Y (N/A, 12/03/2019); Colonoscopy; Esophagogastroduodenoscopy; Lumbar laminectomy/decompression microdiscectomy (2022); and Upper gastrointestinal endoscopy. Social History Patient  reports that he quit smoking about 32 years ago. His smoking use included cigarettes. He  started smoking about 36 years ago. He has never been exposed to tobacco smoke. He has never used smokeless tobacco. He reports that he does not drink alcohol and does not use drugs. Family History family history includes Allergic rhinitis in his brother; Allergies in his daughter; Asthma in his brother; Cancer in his maternal uncle; Diabetes in his father; Heart attack in his maternal grandfather; Hypertension in his father. Review of Systems: Constitutional: negative for fever or malaise Ophthalmic: negative for photophobia, double vision or loss of vision Cardiovascular: negative for chest pain, dyspnea on exertion, or new LE swelling Respiratory: negative for SOB or persistent cough Gastrointestinal: negative for abdominal pain, change in bowel habits or melena Genitourinary: negative for dysuria or gross hematuria Musculoskeletal: negative for new gait disturbance or muscular weakness Integumentary: negative for new or persistent rashes Neurological: negative for TIA or stroke symptoms Psychiatric: negative for SI or delusions Allergic/Immunologic: negative for hives  Patient Care Team    Relationship Specialty Notifications Start End  Luevenia Saha, MD PCP - General Family Medicine  02/27/19   Surgery, [facility]  04/08/19   Evangeline Hilts, MD Consulting Physician Pain Medicine  05/21/19   Agustina Aldrich, MD Consulting Physician Neurosurgery  06/03/20   Tobin Forts, MD Consulting Physician Gastroenterology  06/03/20   Kandice Orleans, MD Consulting Physician Allergy  and Immunology  08/10/22    Objective  Vitals: BP 124/82   Pulse 91   Temp 97.8 F (36.6 C)   Ht 5\' 3"  (1.6 m)   Wt 229 lb (103.9 kg)   SpO2 99%   BMI 40.57 kg/m  General:  Well developed, well nourished, no acute distress  Psych:  Alert and orientedx3,normal mood and affect HEENT:  Normocephalic, atraumatic, non-icteric sclera,  oropharynx is clear without mass or exudate, supple neck without  adenopathy, or thyromegaly Cardiovascular:  Normal S1, S2, RRR without gallop, rub or murmur,  Respiratory:  Good breath sounds bilaterally, CTAB with normal respiratory effort Gastrointestinal: normal bowel sounds, soft, non-tender, no noted masses. No HSM MSK: Joints are without erythema or swelling.  Skin:  Warm, no rashes Neurologic:    Mental status is normal.  Gross motor and sensory exams are normal. Stable gait. No tremor Diabetic Foot Exam: Appearance - no lesions, ulcers or significant calluses Skin - no sigificant pallor or erythema Normal sensation Pulses - +2 distally bilaterally

## 2023-06-28 NOTE — Patient Instructions (Addendum)
 Please return in 6 months to recheck diabetes and cholesterol. Please come fasting.   I will release your lab results to you on your MyChart account with further instructions. You may see the results before I do, but when I review them I will send you a message with my report or have my assistant call you if things need to be discussed. Please reply to my message with any questions. Thank you!   If you have any questions or concerns, please don't hesitate to send me a message via MyChart or call the office at [phone]. Thank you for visiting with us  today! It's our pleasure caring for you.    VISIT SUMMARY: Today, you had a complete physical examination and follow-up for your chronic conditions, including type 2 diabetes, hyperlipidemia, chronic back pain, and major depression. We also discussed your recent ER visit for atypical chest pain.  YOUR PLAN: -ATYPICAL CHEST PAIN: Atypical chest pain refers to chest pain that does not fit the typical pattern of heart-related pain. You had a recent episode that was evaluated in the ER, and you are scheduled for a cardiac stress test and a cardiology evaluation. In the meantime, take baby aspirin  daily, avoid significant exertion, and complete the scheduled cardiac stress test and follow-up with the cardiologist.  -TYPE 2 DIABETES MELLITUS: Type 2 diabetes is a condition where your body does not use insulin properly, leading to high blood sugar levels. Your diabetes is currently controlled through diet, and your recent A1c was 6.7%. Continue with your diet control, and we will monitor your hemoglobin A1c and urine protein annually. Follow up in 6 months.  -HYPERLIPIDEMIA: Hyperlipidemia is a condition of having high levels of fats (lipids) in your blood, which can increase your risk of heart disease. Your LDL cholesterol is currently at 94 mg/dL. We will increase your Lipitor (atorvastatin) to daily dosing and recheck your cholesterol levels in 6  months.  -CHRONIC PAIN: Chronic pain is long-standing pain that persists beyond the usual recovery period. Your back and piriformis pain are being managed with massage therapy and chiropractic care. Continue with these treatments.  -DEPRESSION: Depression is a mood disorder that causes a persistent feeling of sadness and loss of interest. You report that your depression is improving, and you are awaiting an appointment with a new mental health provider. Continue your current mental health medications and follow up with the new provider when available.  -ENLARGED PROSTATE: An enlarged prostate can cause urinary symptoms such as hesitancy and a weak stream. You are awaiting further evaluation and imaging. Consider starting Flomax (tamsulosin) and complete the scheduled imaging and follow up with the [facility] doctor.  INSTRUCTIONS: 1. Take baby aspirin  daily for atypical chest pain. 2. Avoid significant exertion until your cardiac evaluation is complete. 3. Complete the scheduled cardiac stress test and follow up with the cardiologist. 4. Continue diet control for type 2 diabetes and monitor hemoglobin A1c and urine protein annually. 5. Increase Lipitor (atorvastatin) to daily dosing and recheck cholesterol levels in 6 months. 6. Continue massage therapy and chiropractic care for chronic pain. 7. Continue current mental health medic ations and follow up with the new mental health provider when available. 8. Consider starting Flomax (tamsulosin) for enlarged prostate and complete the scheduled imaging and [facility].                      Contains text generated by [employer]bridge.  Contains text generated by [employer]bridge.

## 2023-06-29 LAB — IRON,TIBC AND FERRITIN PANEL
%SAT: 20 % (ref 20–48)
Ferritin: 8 ng/mL — ABNORMAL LOW (ref 38–380)
Iron: 78 ug/dL (ref 50–180)
TIBC: 400 ug/dL (ref 250–425)

## 2023-07-07 ENCOUNTER — Ambulatory Visit: Payer: Self-pay | Admitting: Family Medicine

## 2023-07-07 NOTE — Progress Notes (Signed)
 Please call patient: labs show a mild iron deficiency: please start otc iron once or twice a day for at least 6 weeks. Please take your lipitor nightly to help lower your cholesterol. No other changes are needed at this time.

## 2023-07-18 NOTE — Patient Instructions (Addendum)
 Allergic Rhinitis:  - Positive skin test 08/2022: trees, grasses, molds - Avoidance measures discussed. - Use nasal saline rinses before nose sprays such as with Neilmed Sinus Rinse.  Use distilled water.   - Use Flonase  2 sprays each nostril daily. Aim upward and outward. - Use Azelastine  1-2 sprays each nostril twice daily as needed for congestion, drainage, sneezing, runny nose. Aim upward and outward. - Use Zyrtec  10 mg daily.  - Consider allergy  shots as long term control of your symptoms by teaching your immune system to be more tolerant of your allergy  triggers.   Idiopathic Urticaria (Hives): - At this time etiology of hives and swelling is unknown. Hives can be caused by a variety of different triggers including illness/infection, exercise, pressure, vibrations, extremes of temperature to name a few however majority of the time there is no identifiable trigger.   -Increase Zyrtec  10mg  twice daily.  Caution as this may make you sleepy - Consider getting lab work at your next office visit   Eczema: - Do a daily soaking tub bath in warm water for 10-15 minutes.  - Use a gentle, unscented cleanser at the end of the bath (such as Dove unscented bar or baby wash, or Aveeno sensitive body wash). Then rinse, pat half-way dry, and apply a gentle, unscented moisturizer cream or ointment (Cerave, Cetaphil, Eucerin, Aveeno)  all over while still damp. Dry skin makes the itching and rash of eczema worse. The skin should be moisturized with a gentle, unscented moisturizer at least twice daily.  - Use only unscented liquid laundry detergent. - Apply prescribed topical steroid (triamcinolone  0.1% below neck or hydrocortisone  2.5% above neck) to flared areas (red and thickened eczema) after the moisturizer has soaked into the skin (wait at least 30 minutes). Taper off the topical steroids as the skin improves. Do not use topical steroid for more than 7-10 days at a time.   Follow up in 6-8 weeks or sooner  if needed

## 2023-07-19 ENCOUNTER — Encounter: Payer: Self-pay | Admitting: Family

## 2023-07-19 ENCOUNTER — Other Ambulatory Visit: Payer: Self-pay

## 2023-07-19 ENCOUNTER — Ambulatory Visit (INDEPENDENT_AMBULATORY_CARE_PROVIDER_SITE_OTHER): Payer: BC Managed Care – PPO | Admitting: Family

## 2023-07-19 VITALS — BP 106/82 | HR 86 | Temp 97.5°F

## 2023-07-19 DIAGNOSIS — L2084 Intrinsic (allergic) eczema: Secondary | ICD-10-CM

## 2023-07-19 DIAGNOSIS — J302 Other seasonal allergic rhinitis: Secondary | ICD-10-CM

## 2023-07-19 DIAGNOSIS — L501 Idiopathic urticaria: Secondary | ICD-10-CM | POA: Diagnosis not present

## 2023-07-19 DIAGNOSIS — J3089 Other allergic rhinitis: Secondary | ICD-10-CM

## 2023-07-19 MED ORDER — AZELASTINE HCL 0.1 % NA SOLN: 1.0000 | Freq: Two times a day (BID) | NASAL | 5 refills | Status: DC | PRN

## 2023-07-19 MED ORDER — FLUTICASONE PROPIONATE 50 MCG/ACT NA SUSP: NASAL | 5 refills | Status: DC

## 2023-07-19 MED ORDER — CETIRIZINE HCL 10 MG PO TABS: ORAL_TABLET | ORAL | 2 refills | Status: DC

## 2023-07-19 NOTE — Progress Notes (Signed)
 [address] [place] [address] Dept: [phone]  FOLLOW UP NOTE  Patient ID: [name], male    DOB: [birth year]  Age: 53 y.o. MRN: [id number] Date of Office Visit: 07/19/2023  Assessment  Chief Complaint: Follow-up (Allergies/), Pruritus, and Rash (On chest x 2 weeks.)  HPI [name] is a 53 year old male who presents today for follow-up of seasonal and perennial allergic rhinitis, idiopathic urticaria, and intrinsic atopic dermatitis.  He was last seen on January 18, 2023 by Dr. Lydia Sams.  He reports since his last office visit he went to the emergency room last month due to having symptoms of a heart attack.  He has an upcoming appointment with cardiologist in July he thinks.  He reports that he had a nuclear test that is normal.  I do not see these results to review.  Allergic rhinitis: He does feel like his medications help his symptoms.  He has rhinorrhea at times, nasal congestion, and postnasal drip.  He is currently taking Zyrtec  10 mg daily and uses Flonase  nasal spray and azelastine  nasal spray as needed.  He is not been treated for any sinus infections since we last saw him.  Idiopathic urticaria: He reports for the past month he has had an itchy rash on his upper chest area and sometimes he will be itchy on his abdomen but does not see a rash.  He is taking Zyrtec  10 mg daily and when he has this rash he increases his Zyrtec  to 10 mg twice a day helps.  He feels like he is breaking out about every other day for the past month and that it is worse at night.  He denies any concomitant cardiorespiratory or gastrointestinal symptoms.  He is currently not taking any medications or eating new foods.  He is not using any new products.  No one else in the household has this rash.  He denies any fevers or chills.  He reports joint pain, but mentions it is no more than normal.  He has not had any recent bug/insect bites.  The rash will stay there until the medicine kicks  in.  When they leave they do not leave any residual bruising.  He has noticed that tomato-based foods causes it to be itchy.  His skin testing in July 2024 was negative to select foods.  Eczema: He has not noticed any eczematous lesions since his last office visit.   Drug Allergies:  Allergies  Allergen Reactions   Atorvastatin Swelling   Pregabalin      Other reaction(s): Unknown   Tizanidine Hives   Cymbalta  [Duloxetine  Hcl]     Stomach upset, difficulty urinating, decreased libido.    Review of Systems: Negative except as per HPI  Physical Exam: BP 106/82   Pulse 86   Temp (!) 97.5 F (36.4 C)   SpO2 97%    Physical Exam Constitutional:      Appearance: Normal appearance.  HENT:     Head: Normocephalic and atraumatic.     Comments: Pharynx normal, eyes normal, ears normal, nose normal    Right Ear: Tympanic membrane, ear canal and external ear normal.     Left Ear: Tympanic membrane, ear canal and external ear normal.     Nose: Nose normal.     Mouth/Throat:     Mouth: Mucous membranes are moist.     Pharynx: Oropharynx is clear.   Eyes:     Conjunctiva/sclera: Conjunctivae normal.    Cardiovascular:  Rate and Rhythm: Regular rhythm.     Heart sounds: Normal heart sounds.  Pulmonary:     Effort: Pulmonary effort is normal.     Breath sounds: Normal breath sounds.     Comments: Lungs clear to auscultation  Musculoskeletal:     Cervical back: Neck supple.   Skin:    General: Skin is warm.     Comments: Few papular hyperpigmented areas noted in right upper chest region   Neurological:     Mental Status: He is alert and oriented to person, place, and time.   Psychiatric:        Mood and Affect: Mood normal.        Behavior: Behavior normal.        Thought Content: Thought content normal.        Judgment: Judgment normal.     Diagnostics:  None  Assessment and Plan: 1. Idiopathic urticaria   2. Seasonal and perennial allergic rhinitis   3.  Intrinsic atopic dermatitis     No orders of the defined types were placed in this encounter.   Patient Instructions  Allergic Rhinitis:  - Positive skin test 08/2022: trees, grasses, molds - Avoidance measures discussed. - Use nasal saline rinses before nose sprays such as with Neilmed Sinus Rinse.  Use distilled water.   - Use Flonase  2 sprays each nostril daily. Aim upward and outward. - Use Azelastine  1-2 sprays each nostril twice daily as needed for congestion, drainage, sneezing, runny nose. Aim upward and outward. - Use Zyrtec  10 mg daily.  - Consider allergy  shots as long term control of your symptoms by teaching your immune system to be more tolerant of your allergy  triggers.   Idiopathic Urticaria (Hives): - At this time etiology of hives and swelling is unknown. Hives can be caused by a variety of different triggers including illness/infection, exercise, pressure, vibrations, extremes of temperature to name a few however majority of the time there is no identifiable trigger.   -Increase Zyrtec  10mg  twice daily.  Caution as this may make you sleepy - Consider getting lab work at your next office visit   Eczema: - Do a daily soaking tub bath in warm water for 10-15 minutes.  - Use a gentle, unscented cleanser at the end of the bath (such as Dove unscented bar or baby wash, or Aveeno sensitive body wash). Then rinse, pat half-way dry, and apply a gentle, unscented moisturizer cream or ointment (Cerave, Cetaphil, Eucerin, Aveeno)  all over while still damp. Dry skin makes the itching and rash of eczema worse. The skin should be moisturized with a gentle, unscented moisturizer at least twice daily.  - Use only unscented liquid laundry detergent. - Apply prescribed topical steroid (triamcinolone  0.1% below neck or hydrocortisone  2.5% above neck) to flared areas (red and thickened eczema) after the moisturizer has soaked into the skin (wait at least 30 minutes). Taper off the topical  steroids as the skin improves. Do not use topical steroid for more than 7-10 days at a time.   Follow up in 6-8 weeks or sooner if needed  Return in about 6 weeks (around 08/30/2023), or if symptoms worsen or fail to improve.    Thank you for the opportunity to care for this patient.  Please do not hesitate to contact me with questions.  Tinnie Forehand, FNP [facility] 

## 2023-08-30 ENCOUNTER — Encounter: Payer: Self-pay | Admitting: Internal Medicine

## 2023-08-30 ENCOUNTER — Other Ambulatory Visit: Payer: Self-pay

## 2023-08-30 ENCOUNTER — Ambulatory Visit (INDEPENDENT_AMBULATORY_CARE_PROVIDER_SITE_OTHER): Admitting: Internal Medicine

## 2023-08-30 VITALS — BP 118/78 | HR 88 | Temp 98.0°F | Wt 225.3 lb

## 2023-08-30 DIAGNOSIS — J3089 Other allergic rhinitis: Secondary | ICD-10-CM | POA: Diagnosis not present

## 2023-08-30 DIAGNOSIS — J302 Other seasonal allergic rhinitis: Secondary | ICD-10-CM | POA: Diagnosis not present

## 2023-08-30 DIAGNOSIS — L2084 Intrinsic (allergic) eczema: Secondary | ICD-10-CM | POA: Diagnosis not present

## 2023-08-30 DIAGNOSIS — L501 Idiopathic urticaria: Secondary | ICD-10-CM | POA: Diagnosis not present

## 2023-08-30 MED ORDER — CETIRIZINE HCL 10 MG PO TABS: 10.0000 mg | ORAL_TABLET | Freq: Two times a day (BID) | ORAL | 1 refills | Status: DC | PRN

## 2023-08-30 MED ORDER — TRIAMCINOLONE ACETONIDE 0.1 % EX OINT: TOPICAL_OINTMENT | CUTANEOUS | 5 refills | Status: AC

## 2023-08-30 MED ORDER — AZELASTINE HCL 0.1 % NA SOLN: 1.0000 | Freq: Two times a day (BID) | NASAL | 1 refills | Status: DC | PRN

## 2023-08-30 MED ORDER — FLUTICASONE PROPIONATE 50 MCG/ACT NA SUSP: NASAL | 1 refills | Status: DC

## 2023-08-30 NOTE — Progress Notes (Deleted)
 FOLLOW UP Date of Service/Encounter:  08/30/23   Subjective:  [name] (DOB: 08-12-1970) is a 53 y.o. male who [facility] for follow up for allergic rhinitis, idiopathic urticaria, and eczema  [name] has been doing well regarding his eczema and allergic rhinitis, although he had onset of nasal congestion when emptying a storage unit this weekend with mold and dust. Since then he had some nasal congestion and vocal hoarseness that has improved over the past few days. He uses the flonase  and asteline each day and notices a difference if he does not use the asteline. The patient also takes zyrtec  morning and night for his chronic urticaria, which affects him slightly less than daily. The itchiness is primarily to the torso, arms, and are accompanied by raised blotches that are slightly red.  A year of breaking out with itchiness sometime in 2024. Areas of blotchy redness  Itchiness several days a week that comes and goes It's okay   Spring time his congestion was not well controlled, but   Storage unit, allergic  trees, grasses, molds  Eczema torso and arms  Triamcinolone  helps  Turbinate hypertrophy Septum deviated to the left Tonsillar hypertrophy  History obtained from: chart review and patient.   Past Medical History: Past Medical History:  Diagnosis Date   Adrenal abnormality (HCC)    Allergy     Anxiety    Arthritis    Atypical chest pain    BPH (benign prostatic hypertrophy)    Complication of anesthesia    Hard time waking up after neck surgery   Depression    GERD (gastroesophageal reflux disease)    not since Gastric Bypass   Gout    H/O: CVA (cardiovascular accident)    Hyperlipidemia    Knee pain, bilateral    LBP (low back pain)    Neuromuscular disorder (HCC)    OSA (obstructive sleep apnea)    Plantar fasciitis    PTSD (post-traumatic stress disorder)    Seizure disorder (HCC)    Sleep apnea     Stroke (HCC) 2001   some memory loss- math , science     Objective:  There were no vitals taken for this visit. There is no height or weight on file to calculate BMI. Physical Exam: GEN: alert, well developed HEENT: clear conjunctiva, nose with mild inferior turbinate hypertrophy, pink nasal mucosa, clear rhinorrhea, + cobblestoning HEART: regular rate and rhythm, no murmur LUNGS: clear to auscultation bilaterally, no coughing, unlabored respiration SKIN: no rashes or lesions  Data Reviewed: *** Labs: SPT:  Spirometry:  Tracings reviewed. His effort: {Blank single:19197::Good reproducible efforts.,It was hard to get consistent efforts and there is a question as to whether this reflects a maximal maneuver.,Poor effort, data can not be interpreted.,Variable effort-results affected.,decent for first attempt at spirometry.} FVC: ***L, ***% predicted  FEV1: ***L, ***% predicted FEV1/FVC ratio: ***% Interpretation: {Blank single:19197::Spirometry consistent with mild obstructive disease,Spirometry consistent with moderate obstructive disease,Spirometry consistent with severe obstructive disease,Spirometry consistent with possible restrictive disease,Spirometry consistent with mixed obstructive and restrictive disease,Spirometry uninterpretable due to technique,Spirometry consistent with normal pattern,No overt abnormalities noted given today's efforts}.  Please see scanned spirometry results for details.  Skin Testing:  Skin prick testing was placed, which includes aeroallergens/foods, histamine control, and saline control.  Verbal consent was obtained prior to placing test.  Patient tolerated procedure well.  Allergy  testing results were read and interpreted by myself, documented by clinical staff. Adequate positive and  negative control.  Positive results to:  Results discussed with patient/family.    Assessment:   No diagnosis  found.  Plan/Recommendations:    There are no Patient Instructions on file for this visit.    No follow-ups on file.  Arleta Blanch, MD [facility] 

## 2023-08-30 NOTE — Patient Instructions (Addendum)
 Allergic Rhinitis:  - Positive skin test 08/2022: trees, grasses, molds - Use nasal saline rinses before nose sprays such as with Neilmed Sinus Rinse.  Use distilled water.   - Use Flonase  2 sprays each nostril daily. Aim upward and outward. - Use Azelastine  1-2 sprays each nostril twice daily as needed for congestion, drainage, sneezing, runny nose. Aim upward and outward. - Use Zyrtec  10 mg daily. Can take extra dose if needed.  - Consider allergy  shots as long term control of your symptoms by teaching your immune system to be more tolerant of your allergy  triggers.   Idiopathic Urticaria (Hives): - At this time etiology of hives and swelling is unknown. Hives can be caused by a variety of different triggers including illness/infection, exercise, pressure, vibrations, extremes of temperature to name a few however majority of the time there is no identifiable trigger.   - Continue Zyrtec  10mg  twice daily.  Can wean to once daily once weather cools.   Eczema: - Do a daily soaking tub bath in warm water for 10-15 minutes.  - Use a gentle, unscented cleanser at the end of the bath (such as Dove unscented bar or baby wash, or Aveeno sensitive body wash). Then rinse, pat half-way dry, and apply a gentle, unscented moisturizer cream or ointment (Cerave, Cetaphil, Eucerin, Aveeno)  all over while still damp. Dry skin makes the itching and rash of eczema worse. The skin should be moisturized with a gentle, unscented moisturizer at least twice daily.  - Use only unscented liquid laundry detergent. - Apply prescribed topical steroid (triamcinolone  0.1% below neck or hydrocortisone  2.5% above neck) to flared areas (red and thickened eczema) after the moisturizer has soaked into the skin (wait at least 30 minutes). Taper off the topical steroids as the skin improves. Do not use topical steroid for more than 7-10 days at a time.

## 2023-08-30 NOTE — Progress Notes (Signed)
 FOLLOW UP Date of Service/Encounter:  08/30/23   Subjective:  [name] (DOB: Apr 14, 1970) is a 53 y.o. male who [facility] for follow up for eczema, idiopathic urticaria and allergic rhinitis.   History obtained from: chart review and patient. Last visit was with Wanda Craze  AR- Flonase , Azelastine , Zyrtec  CSU- uncontrolled, increase to Zyrtec  BID Eczema- topical steroids   Reports allergies are doing well but were worse in Spring.  Does note if he stops his medications, his allergies are worse with congestion, runny nose, drainage.   Using Flonase /Azelastine /Zyrtec  daily.  Reports hives are doing okay. Sometimes has red splotches that itch but not too frequent.  Doing well on Zyrtec  BID.  Has not tried backing down  Eczema is controlled, rarely needs topical steroids.     Past Medical History: Past Medical History:  Diagnosis Date   Adrenal abnormality (HCC)    Allergy     Anxiety    Arthritis    Atypical chest pain    BPH (benign prostatic hypertrophy)    Complication of anesthesia    Hard time waking up after neck surgery   Depression    GERD (gastroesophageal reflux disease)    not since Gastric Bypass   Gout    H/O: CVA (cardiovascular accident)    Hyperlipidemia    Knee pain, bilateral    LBP (low back pain)    Neuromuscular disorder (HCC)    OSA (obstructive sleep apnea)    Plantar fasciitis    PTSD (post-traumatic stress disorder)    Seizure disorder (HCC)    Sleep apnea    Stroke (HCC) 2001   some memory loss- math , science     Objective:  BP 118/78 (BP Location: Left Arm, Patient Position: Sitting, Cuff Size: Large)   Pulse 88   Temp 98 F (36.7 C) (Temporal)   Wt 225 lb 4.8 oz (102.2 kg)   SpO2 97%   BMI 39.91 kg/m  Body mass index is 39.91 kg/m. Physical Exam: GEN: alert, well developed HEENT: clear conjunctiva, nose with mild inferior turbinate hypertrophy, pink nasal mucosa, +  rhinorrhea HEART: regular rate and rhythm, no murmur LUNGS: clear to auscultation bilaterally, no coughing, unlabored respiration SKIN: no rashes or lesions   Assessment:   1. Seasonal and perennial allergic rhinitis   2. Idiopathic urticaria   3. Intrinsic atopic dermatitis     Plan/Recommendations:   Allergic Rhinitis: - Controlled on medical therapy   - Positive skin test 08/2022: trees, grasses, molds - Use nasal saline rinses before nose sprays such as with Neilmed Sinus Rinse.  Use distilled water.   - Use Flonase  2 sprays each nostril daily. Aim upward and outward. - Use Azelastine  1-2 sprays each nostril twice daily as needed for congestion, drainage, sneezing, runny nose. Aim upward and outward. - Use Zyrtec  10 mg daily. Can take extra dose if needed.  - Consider allergy  shots as long term control of your symptoms by teaching your immune system to be more tolerant of your allergy  triggers.   Idiopathic Urticaria (Hives): - Controlled, can decrease anti histamine dose if he remains controlled through summer  - At this time etiology of hives and swelling is unknown. Hives can be caused by a variety of different triggers including illness/infection, exercise, pressure, vibrations, extremes of temperature to name a few however majority of the time there is no identifiable trigger.   - Continue Zyrtec  10mg  twice daily.  Can wean  to once daily once weather cools.   Eczema: - Well controlled  - Do a daily soaking tub bath in warm water for 10-15 minutes.  - Use a gentle, unscented cleanser at the end of the bath (such as Dove unscented bar or baby wash, or Aveeno sensitive body wash). Then rinse, pat half-way dry, and apply a gentle, unscented moisturizer cream or ointment (Cerave, Cetaphil, Eucerin, Aveeno)  all over while still damp. Dry skin makes the itching and rash of eczema worse. The skin should be moisturized with a gentle, unscented moisturizer at least twice daily.  - Use  only unscented liquid laundry detergent. - Apply prescribed topical steroid (triamcinolone  0.1% below neck or hydrocortisone  2.5% above neck) to flared areas (red and thickened eczema) after the moisturizer has soaked into the skin (wait at least 30 minutes). Taper off the topical steroids as the skin improves. Do not use topical steroid for more than 7-10 days at a time.      Return in about 6 months (around 03/01/2024).  Arleta Blanch, MD [facility] 

## 2023-12-14 NOTE — Progress Notes (Signed)
 [name]                                          MRN: [id number]   12/14/2023   The [facility] Quality Team Specialist reviewed this patient medical record for the purposes of chart review for care gap closure. The following were reviewed: abstraction for care gap closure-kidney health evaluation for diabetes:eGFR  and uACR from [place] records.    [facility] Quality Team

## 2024-01-03 ENCOUNTER — Encounter: Payer: Self-pay | Admitting: Family Medicine

## 2024-01-03 ENCOUNTER — Ambulatory Visit: Admitting: Family Medicine

## 2024-01-03 VITALS — BP 112/84 | HR 85 | Temp 98.4°F | Ht 63.0 in | Wt 224.6 lb

## 2024-01-03 DIAGNOSIS — E1142 Type 2 diabetes mellitus with diabetic polyneuropathy: Secondary | ICD-10-CM

## 2024-01-03 DIAGNOSIS — E785 Hyperlipidemia, unspecified: Secondary | ICD-10-CM | POA: Diagnosis not present

## 2024-01-03 DIAGNOSIS — E1169 Type 2 diabetes mellitus with other specified complication: Secondary | ICD-10-CM

## 2024-01-03 DIAGNOSIS — E782 Mixed hyperlipidemia: Secondary | ICD-10-CM

## 2024-01-03 LAB — LIPID PANEL
Cholesterol: 177 mg/dL (ref 0–200)
HDL: 64 mg/dL (ref >39.00)
LDL Cholesterol: 96 mg/dL (ref 0–99)
NonHDL: 112.81
Total CHOL/HDL Ratio: 3
Triglycerides: 82 mg/dL (ref 0.0–149.0)
VLDL: 16.4 mg/dL (ref 0.0–40.0)

## 2024-01-03 LAB — COMPREHENSIVE METABOLIC PANEL WITH GFR
ALT: 18 U/L (ref 0–53)
AST: 19 U/L (ref 0–37)
Albumin: 4.3 g/dL (ref 3.5–5.2)
Alkaline Phosphatase: 50 U/L (ref 39–117)
BUN: 12 mg/dL (ref 6–23)
CO2: 33 meq/L — ABNORMAL HIGH (ref 19–32)
Calcium: 9.5 mg/dL (ref 8.4–10.5)
Chloride: 101 meq/L (ref 96–112)
Creatinine, Ser: 0.95 mg/dL (ref 0.40–1.50)
GFR: 90.71 mL/min (ref >60.00)
Glucose, Bld: 93 mg/dL (ref 70–99)
Potassium: 4.2 meq/L (ref 3.5–5.1)
Sodium: 140 meq/L (ref 135–145)
Total Bilirubin: 0.5 mg/dL (ref 0.2–1.2)
Total Protein: 7.2 g/dL (ref 6.0–8.3)

## 2024-01-03 LAB — HEMOGLOBIN A1C: Hgb A1c MFr Bld: 6.6 % — ABNORMAL HIGH (ref 4.6–6.5)

## 2024-01-03 MED ORDER — ROSUVASTATIN CALCIUM 40 MG PO TABS: 40.0000 mg | ORAL_TABLET | Freq: Every evening | ORAL | Status: AC

## 2024-01-03 NOTE — Progress Notes (Signed)
 Subjective  CC:  Chief Complaint  Patient presents with   Diabetes   Hyperlipidemia    HPI: [name] is a 53 y.o. male who presents to the office today for follow up of diabetes and problems listed above in the chief complaint.  Diabetes follow up: His diabetic control is reported as Unchanged. Diet remains good. Limited exercise due to chronic pain. Diet controlled.  He denies exertional CP or SOB or symptomatic hypoglycemia. He denies foot sores or paresthesias. Normotensive not on ace. Neg urine MAC ratio HLD: on rosuvastatin  40 and tolerating it. Fasting today. LDL goal < 70. H/o CVA  Wt Readings from Last 3 Encounters:  01/03/24 224 lb 9.6 oz (101.9 kg)  08/30/23 225 lb 4.8 oz (102.2 kg)  06/28/23 229 lb (103.9 kg)    BP Readings from Last 3 Encounters:  01/03/24 112/84  08/30/23 118/78  07/19/23 106/82    Assessment  1. Type 2 diabetes mellitus with diabetic polyneuropathy, without long-term current use of insulin (HCC)   2. Hyperlipidemia associated with type 2 diabetes mellitus (HCC), on statin   3. Mixed hyperlipidemia      Plan  Diabetes has been diet controlled. Clinically sounds stable. Will check A1c today. Goal A1c < 7.0. discussed starting medications if higher. Rec exercise if able HLD on rosuvastatin  40. Recheck lipids and cmp. Should be at goal now.   Follow up: 6 mo for cpe and f/u chronic problems. Orders Placed This Encounter  Procedures   Hemoglobin A1c   Comprehensive metabolic panel with GFR   Lipid panel   Meds ordered this encounter  Medications   rosuvastatin  (CRESTOR ) 40 MG tablet    Sig: Take 1 tablet (40 mg total) by mouth at bedtime.      Immunization History  Administered Date(s) Administered   Dtap, Unspecified 08/22/1969, 10/24/1969, 11/21/1969, 02/05/1971, 09/04/1975   Influenza Inj Mdck Quad Pf 11/11/2021   Influenza Split 09/13/2011   Influenza Whole 11/18/2008   Influenza, Seasonal, Injecte, Preservative Fre  12/21/2022   Influenza,inj,Quad PF,6+ Mos 11/15/2012, 11/12/2013, 09/26/2016, 11/21/2017, 11/19/2018, 12/11/2019, 10/08/2020, 02/19/2021   Measles [birth year]1970   Moderna Covid-19 Fall Seasonal Vaccine 43yrs & older 12/21/2022   Moderna Covid-19 Vaccine Bivalent Booster 82yrs & up 10/22/2020   PFIZER(Purple Top)SARS-COV-2 Vaccination 04/14/2019, 05/12/2019, 01/05/2020, 05/23/2020   PNEUMOCOCCAL CONJUGATE-20 10/22/2020   Pfizer(Comirnaty)Fall Seasonal Vaccine 12 years and older 11/11/2021   Pneumococcal Polysaccharide-23 12/22/2010   Polio, Unspecified 08/22/1969, 10/24/1969, 01/15/1970, 02/05/1971, 09/04/1975   Rubella 08/12/1971   Td 03/16/2009, 02/19/2021   Tdap 02/17/2011, 02/19/2020   Zoster Recombinant(Shingrix) 07/05/2019, 01/10/2020    Diabetes Related Lab Review: Lab Results  Component Value Date   HGBA1C 6.7 (H) 06/28/2023   HGBA1C 6.1 (A) 12/23/2022   HGBA1C 6.7 (H) 06/22/2022    Lab Results  Component Value Date   MICROALBUR 3.85 (H) 08/28/2013   Lab Results  Component Value Date   CREATININE 0.9 06/21/2023   BUN 8 06/22/2022   NA 140 06/22/2022   K 4.1 06/22/2022   CL 102 06/22/2022   CO2 32 06/22/2022   Lab Results  Component Value Date   CHOL 155 06/22/2022   CHOL 135 03/11/2021   CHOL 153 06/03/2020   Lab Results  Component Value Date   HDL 62.40 06/22/2022   HDL 62.90 03/11/2021   HDL 62.00 06/03/2020   Lab Results  Component Value Date   LDLCALC 71 06/22/2022   LDLCALC 60 03/11/2021   LDLCALC 79 06/03/2020  Lab Results  Component Value Date   TRIG 108.0 06/22/2022   TRIG 61.0 03/11/2021   TRIG 61.0 06/03/2020   Lab Results  Component Value Date   CHOLHDL 2 06/22/2022   CHOLHDL 2 03/11/2021   CHOLHDL 2 06/03/2020   Lab Results  Component Value Date   LDLDIRECT 148.1 11/04/2010   LDLDIRECT 207.7 02/08/2007   The ASCVD Risk score (Arnett DK, et al., 2019) failed to calculate for the following reasons:   Risk score cannot be  calculated because patient has a medical history suggesting prior/existing ASCVD I have reviewed the PMH, Fam and Soc history. Patient Active Problem List   Diagnosis Date Noted   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 12/03/2019    Priority: High   Chronic post-traumatic stress disorder (PTSD) after military combat 06/03/2017    Priority: High   Major depression, recurrent, chronic 01/01/2017    Priority: High   Chronic pain syndrome 09/03/2016    Priority: High   Obesity (BMI 30-39.9) 03/15/2013    Priority: High    The patient is asked to make an attempt to improve diet and exercise patterns to aid in medical management of this problem.     Type 2 diabetes mellitus with diabetic polyneuropathy, without long-term current use of insulin (HCC) 12/22/2010    Priority: High    dxd in 2009 Diet controlled. Normotensive. Not on ace 2024    Hyperlipidemia associated with type 2 diabetes mellitus (HCC), on statin 08/23/2006    Priority: High   Obstructive sleep apnea 08/23/2006    Priority: High    Sleep study 08/2000 - mild sleep apnea now; but prefers to continue cpap, dr. jude  NPSG 2006:  AHI 78/hr. Auto optimized to 13-14 cm.  Autoset 01/2011 to 13 cm.    Chronic low back pain 08/23/2006    Priority: High    Using back brace.  Using as needed Percocet.     Seizure disorder (HCC) 08/23/2006    Priority: High   History of CVA (cerebrovascular accident) 08/23/2006    Priority: High   Idiopathic urticaria 12/23/2022    Priority: Medium    Insomnia 07/15/2015    Priority: Medium    Cervical spondylosis without myelopathy 02/06/2015    Priority: Medium    BPH (benign prostatic hyperplasia) 11/15/2012    Priority: Medium    Chronic pain of both knees 12/08/2008    Priority: Medium     Severe. Daily pain 8/10, down to 7/10 with antiinflammatory. Interfering with life. Limited exercise due to pain.       Gout 08/23/2006    Priority: Medium    Eczema 12/23/2022     Priority: Low   Vitamin B 12 deficiency 12/09/2017    Priority: Low   Allergic rhinitis 08/23/2006    Priority: Low    Sees allergies, Dr. Tobie 2024. + pollen allergies etc. No food allergies. Recommended meds, and allergy  shots if needed    Screening for colorectal cancer 06/28/2023    Colonoscopy Dr. Norleen Kiang 2022: tics, otherwise normal. Repeat in 10 years    Degenerative spondylolisthesis 01/10/2022    Social History: Patient  reports that he quit smoking about 33 years ago. His smoking use included cigarettes. He started smoking about 37 years ago. He has never been exposed to tobacco smoke. He has never used smokeless tobacco. He reports that he does not drink alcohol and does not use drugs.  Review of Systems: Ophthalmic: negative for eye pain,  loss of vision or double vision Cardiovascular: negative for chest pain Respiratory: negative for SOB or persistent cough Gastrointestinal: negative for abdominal pain Genitourinary: negative for dysuria or gross hematuria MSK: negative for foot lesions Neurologic: negative for weakness or gait disturbance  Objective  Vitals: BP 112/84   Pulse 85   Temp 98.4 F (36.9 C)   Ht 5' 3 (1.6 m)   Wt 224 lb 9.6 oz (101.9 kg)   SpO2 99%   BMI 39.79 kg/m  General: well appearing, no acute distress  Psych:  Alert and oriented, normal mood and affect HEENT:  Normocephalic, atraumatic, moist mucous membranes, supple neck  Cardiovascular:  Nl S1 and S2, RRR without murmur, gallop or rub. no edema Respiratory:  Good breath sounds bilaterally, CTAB with normal effort, no rales   Diabetic education: ongoing education regarding chronic disease management for diabetes was given today. We continue to reinforce the ABC's of diabetic management: A1c (<7 or 8 dependent upon patient), tight blood pressure control, and cholesterol management with goal LDL < 100 minimally. We discuss diet strategies, exercise recommendations, medication options and  possible side effects. At each visit, we review recommended immunizations and preventive care recommendations for diabetics and stress that good diabetic control can prevent other problems. See below for this patient's data.   Commons side effects, risks, benefits, and alternatives for medications and treatment plan prescribed today were discussed, and the patient expressed understanding of the given instructions. Patient is instructed to call or message via MyChart if he/she has any questions or concerns regarding our treatment plan. No barriers to understanding were identified. We discussed Red Flag symptoms and signs in detail. Patient expressed understanding regarding what to do in case of urgent or emergency type symptoms.  Medication list was reconciled, printed and provided to the patient in AVS. Patient instructions and summary information was reviewed with the patient as documented in the AVS. This note was prepared with assistance of Dragon voice recognition software. Occasional wrong-word or sound-a-like substitutions may have occurred due to the inherent limitations of voice recognition software

## 2024-01-03 NOTE — Patient Instructions (Signed)
 Please return in 6 months for your annual complete physical; please come fasting.  For follow up on chronic medical conditions   I will release your lab results to you on your MyChart account with further instructions. You may see the results before I do, but when I review them I will send you a message with my report or have my assistant call you if things need to be discussed. Please reply to my message with any questions. Thank you!   If you have any questions or concerns, please don't hesitate to send me a message via MyChart or call the office at [phone]. Thank you for visiting with us  today! It's our pleasure caring for you.

## 2024-01-04 ENCOUNTER — Ambulatory Visit: Payer: Self-pay | Admitting: Family Medicine

## 2024-01-04 NOTE — Progress Notes (Signed)
 See mychart note Dear Mr. Rosko, Your diabetic control remains stable and your kidney and liver function are normal. Your cholesterol is higher this year... can you please verify the dose of your rosuvastatin . Your records from [place] reported you are taking 40mg  nightly but because your levels are higher, I wonder if that is correct. Please let me know  Sincerely, Dr. Jodie

## 2024-03-01 ENCOUNTER — Other Ambulatory Visit: Payer: Self-pay

## 2024-03-01 ENCOUNTER — Ambulatory Visit: Admitting: Internal Medicine

## 2024-03-01 ENCOUNTER — Encounter: Payer: Self-pay | Admitting: Internal Medicine

## 2024-03-01 VITALS — BP 100/68 | HR 89 | Temp 98.4°F

## 2024-03-01 DIAGNOSIS — L501 Idiopathic urticaria: Secondary | ICD-10-CM | POA: Diagnosis not present

## 2024-03-01 DIAGNOSIS — L2084 Intrinsic (allergic) eczema: Secondary | ICD-10-CM | POA: Diagnosis not present

## 2024-03-01 DIAGNOSIS — J3089 Other allergic rhinitis: Secondary | ICD-10-CM

## 2024-03-01 DIAGNOSIS — J302 Other seasonal allergic rhinitis: Secondary | ICD-10-CM

## 2024-03-01 MED ORDER — CETIRIZINE HCL 10 MG PO TABS: 10.0000 mg | ORAL_TABLET | Freq: Two times a day (BID) | ORAL | 1 refills | Status: AC | PRN

## 2024-03-01 MED ORDER — AZELASTINE HCL 0.1 % NA SOLN: 1.0000 | Freq: Two times a day (BID) | NASAL | 1 refills | Status: AC | PRN

## 2024-03-01 MED ORDER — HYDROCORTISONE 2.5 % EX CREA: TOPICAL_CREAM | CUTANEOUS | 5 refills | Status: AC

## 2024-03-01 MED ORDER — FLUTICASONE PROPIONATE 50 MCG/ACT NA SUSP: 2.0000 | Freq: Every day | NASAL | 1 refills | Status: AC

## 2024-03-01 NOTE — Patient Instructions (Addendum)
 Allergic Rhinitis:  - Positive skin test 08/2022: trees, grasses, molds - Use nasal saline rinses before nose sprays such as with Neilmed Sinus Rinse.  Use distilled water.   - Use Flonase  2 sprays each nostril daily. Aim upward and outward. - Use Azelastine  2 sprays each nostril twice daily as needed for congestion, drainage, sneezing, runny nose. Aim upward and outward. - Use Zyrtec  10 mg daily. Can take extra dose if needed.  - Consider allergy  shots as long term control of your symptoms by teaching your immune system to be more tolerant of your allergy  triggers.   Idiopathic Urticaria (Hives): - At this time etiology of hives and swelling is unknown. Hives can be caused by a variety of different triggers including illness/infection, exercise, pressure, vibrations, extremes of temperature to name a few however majority of the time there is no identifiable trigger.   - If hives restart, use Zyrtec  10mg  daily.   - If still uncontrolled, increase to Zyrtec  10mg  twice daily.    Eczema: - Do a daily soaking tub bath in warm water for 10-15 minutes.  - Use a gentle, unscented cleanser at the end of the bath (such as Dove unscented bar or baby wash, or Aveeno sensitive body wash). Then rinse, pat half-way dry, and apply a gentle, unscented moisturizer cream or ointment (Cerave, Cetaphil, Eucerin, Aveeno)  all over while still damp. Dry skin makes the itching and rash of eczema worse. The skin should be moisturized with a gentle, unscented moisturizer at least twice daily.  - Use only unscented liquid laundry detergent. - Apply prescribed topical steroid (triamcinolone  0.1% below neck or hydrocortisone  2.5% above neck) to flared areas (red and thickened eczema) after the moisturizer has soaked into the skin (wait at least 30 minutes). Taper off the topical steroids as the skin improves. Do not use topical steroid for more than 7-10 days at a time.

## 2024-03-01 NOTE — Progress Notes (Signed)
 "  FOLLOW UP Date of Service/Encounter:  03/01/24   Subjective:  [name] (DOB: Jan 06, 1971) is a 54 y.o. male who [facility] for follow up for allergic rhinitis, eczema and urticaria.   History obtained from: chart review and patient. Last seen by me on 08/30/2023: AR- controlled on Flonase /Zyrtec  Hives- controlled on BID Zyrtec , discussed de-escalating dose. Eczema- controlled on low potency topical steroid  Notes no issues with hives, has decreased to Zyrtec  PRN.  No itching/swelling either. Allergies are doing well during this colder weather; feels like his nose is more open and clear.  Using Flonase  daily.  Still intermittently has some congestion and drainage.  Not using Azelastine .  Taking Zyrtec  PRN. Eczema is doing fine, sometimes has small spots that need treatment with topical hydrocortisone , usually just a few days at a time.  Moisturizes daily.  Past Medical History: Past Medical History:  Diagnosis Date   Adrenal abnormality    Allergy     Anxiety    Arthritis    Atypical chest pain    BPH (benign prostatic hypertrophy)    Complication of anesthesia    Hard time waking up after neck surgery   Depression    GERD (gastroesophageal reflux disease)    not since Gastric Bypass   Gout    H/O: CVA (cardiovascular accident)    Hyperlipidemia    Knee pain, bilateral    LBP (low back pain)    Neuromuscular disorder (HCC)    OSA (obstructive sleep apnea)    Plantar fasciitis    PTSD (post-traumatic stress disorder)    Seizure disorder (HCC)    Sleep apnea    Stroke (HCC) 2001   some memory loss- math , science     Objective:  BP 100/68   Pulse 89   Temp 98.4 F (36.9 C)   SpO2 99%  There is no height or weight on file to calculate BMI. Physical Exam: GEN: alert, well developed HEENT: clear conjunctiva, nose with mild inferior turbinate hypertrophy, pink nasal mucosa, clear rhinorrhea, + cobblestoning HEART:  regular rate and rhythm, no murmur LUNGS: clear to auscultation bilaterally, no coughing, unlabored respiration SKIN: no rashes or lesions  Assessment:   1. Intrinsic atopic dermatitis   2. Seasonal and perennial allergic rhinitis   3. Idiopathic urticaria     Plan/Recommendations:   Allergic Rhinitis:  - Controlled  - Positive skin test 08/2022: trees, grasses, molds - Use nasal saline rinses before nose sprays such as with Neilmed Sinus Rinse.  Use distilled water.   - Use Flonase  2 sprays each nostril daily. Aim upward and outward. - Use Azelastine  2 sprays each nostril twice daily as needed for congestion, drainage, sneezing, runny nose. Aim upward and outward. - Use Zyrtec  10 mg daily as needed for runny nose, sneezing, itchy watery eyes. Can take extra dose if needed.  - Consider allergy  shots as long term control of your symptoms by teaching your immune system to be more tolerant of your allergy  triggers.   Idiopathic Urticaria (Hives): - Controlled  - At this time etiology of hives and swelling is unknown. Hives can be caused by a variety of different triggers including illness/infection, exercise, pressure, vibrations, extremes of temperature to name a few however majority of the time there is no identifiable trigger.   - If hives restart, use Zyrtec  10mg  daily.   - If still uncontrolled, increase to Zyrtec  10mg  twice daily.    Eczema: -  Controlled  - Do a daily soaking tub bath in warm water for 10-15 minutes.  - Use a gentle, unscented cleanser at the end of the bath (such as Dove unscented bar or baby wash, or Aveeno sensitive body wash). Then rinse, pat half-way dry, and apply a gentle, unscented moisturizer cream or ointment (Cerave, Cetaphil, Eucerin, Aveeno)  all over while still damp. Dry skin makes the itching and rash of eczema worse. The skin should be moisturized with a gentle, unscented moisturizer at least twice daily.  - Use only unscented liquid laundry  detergent. - Apply prescribed topical steroid (triamcinolone  0.1% below neck or hydrocortisone  2.5% above neck) to flared areas (red and thickened eczema) after the moisturizer has soaked into the skin (wait at least 30 minutes). Taper off the topical steroids as the skin improves. Do not use topical steroid for more than 7-10 days at a time.     Return in about 6 months (around 08/29/2024).  Arleta Blanch, MD [facility]       "

## 2024-07-01 ENCOUNTER — Encounter: Admitting: Family Medicine

## 2024-07-03 ENCOUNTER — Ambulatory Visit: Admitting: Family Medicine

## 2024-08-29 ENCOUNTER — Ambulatory Visit: Admitting: Allergy
